# Patient Record
Sex: Male | Born: 1955 | Race: White | Hispanic: No | Marital: Married | Smoking: Former smoker
Health system: Southern US, Community
[De-identification: ages and names within clinical notes are randomized; demographics above are authoritative.]

## PROBLEM LIST (undated history)

## (undated) DIAGNOSIS — R112 Nausea with vomiting, unspecified: Secondary | ICD-10-CM

## (undated) DIAGNOSIS — B1920 Unspecified viral hepatitis C without hepatic coma: Secondary | ICD-10-CM

## (undated) DIAGNOSIS — D2920 Benign neoplasm of unspecified testis: Secondary | ICD-10-CM

## (undated) DIAGNOSIS — I10 Essential (primary) hypertension: Secondary | ICD-10-CM

## (undated) DIAGNOSIS — I4892 Unspecified atrial flutter: Secondary | ICD-10-CM

## (undated) DIAGNOSIS — K633 Ulcer of intestine: Secondary | ICD-10-CM

## (undated) DIAGNOSIS — Q251 Coarctation of aorta: Secondary | ICD-10-CM

## (undated) DIAGNOSIS — Z7901 Long term (current) use of anticoagulants: Secondary | ICD-10-CM

## (undated) DIAGNOSIS — Z8679 Personal history of other diseases of the circulatory system: Secondary | ICD-10-CM

## (undated) DIAGNOSIS — M109 Gout, unspecified: Secondary | ICD-10-CM

## (undated) DIAGNOSIS — I359 Nonrheumatic aortic valve disorder, unspecified: Secondary | ICD-10-CM

## (undated) DIAGNOSIS — D649 Anemia, unspecified: Secondary | ICD-10-CM

## (undated) DIAGNOSIS — N183 Chronic kidney disease, stage 3 (moderate): Secondary | ICD-10-CM

## (undated) DIAGNOSIS — N189 Chronic kidney disease, unspecified: Principal | ICD-10-CM

## (undated) DIAGNOSIS — Z9889 Other specified postprocedural states: Secondary | ICD-10-CM

## (undated) DIAGNOSIS — N289 Disorder of kidney and ureter, unspecified: Secondary | ICD-10-CM

## (undated) DIAGNOSIS — I719 Aortic aneurysm of unspecified site, without rupture: Secondary | ICD-10-CM

## (undated) DIAGNOSIS — R600 Localized edema: Secondary | ICD-10-CM

## (undated) DIAGNOSIS — E785 Hyperlipidemia, unspecified: Secondary | ICD-10-CM

## (undated) DIAGNOSIS — C6290 Malignant neoplasm of unspecified testis, unspecified whether descended or undescended: Secondary | ICD-10-CM

## (undated) DIAGNOSIS — K317 Polyp of stomach and duodenum: Secondary | ICD-10-CM

## (undated) DIAGNOSIS — I2510 Atherosclerotic heart disease of native coronary artery without angina pectoris: Secondary | ICD-10-CM

## (undated) DIAGNOSIS — E669 Obesity, unspecified: Secondary | ICD-10-CM

## (undated) DIAGNOSIS — I712 Thoracic aortic aneurysm, without rupture: Secondary | ICD-10-CM

## (undated) DIAGNOSIS — I4891 Unspecified atrial fibrillation: Secondary | ICD-10-CM

## (undated) DIAGNOSIS — Z952 Presence of prosthetic heart valve: Secondary | ICD-10-CM

## (undated) DIAGNOSIS — D631 Anemia in chronic kidney disease: Secondary | ICD-10-CM

## (undated) HISTORY — DX: Unspecified atrial flutter: I48.92

## (undated) HISTORY — DX: Personal history of other diseases of the circulatory system: Z86.79

## (undated) HISTORY — DX: Chronic kidney disease, stage 3 (moderate): N18.3

## (undated) HISTORY — DX: Malignant neoplasm of unspecified testis, unspecified whether descended or undescended: C62.90

## (undated) HISTORY — DX: Chronic kidney disease, unspecified: N18.9

## (undated) HISTORY — DX: Coarctation of aorta: Q25.1

## (undated) HISTORY — DX: Aortic aneurysm of unspecified site, without rupture: I71.9

## (undated) HISTORY — DX: Anemia in chronic kidney disease: D63.1

## (undated) HISTORY — DX: Unspecified atrial fibrillation: I48.91

## (undated) HISTORY — DX: Presence of prosthetic heart valve: Z95.2

## (undated) HISTORY — PX: TONSILLECTOMY: SUR1361

## (undated) HISTORY — PX: COARCTATION OF AORTA REPAIR: SHX261

## (undated) HISTORY — DX: Long term (current) use of anticoagulants: Z79.01

## (undated) HISTORY — DX: Essential (primary) hypertension: I10

## (undated) HISTORY — DX: Thoracic aortic aneurysm, without rupture: I71.2

## (undated) HISTORY — DX: Atherosclerotic heart disease of native coronary artery without angina pectoris: I25.10

## (undated) HISTORY — DX: Localized edema: R60.0

## (undated) HISTORY — DX: Hyperlipidemia, unspecified: E78.5

## (undated) HISTORY — DX: Gout, unspecified: M10.9

## (undated) HISTORY — DX: Obesity, unspecified: E66.9

## (undated) HISTORY — DX: Anemia, unspecified: D64.9

---
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
## 1990-05-01 DIAGNOSIS — C6290 Malignant neoplasm of unspecified testis, unspecified whether descended or undescended: Secondary | ICD-10-CM

## 1990-05-01 HISTORY — PX: ORCHIECTOMY: SHX2116

## 1990-05-01 HISTORY — DX: Malignant neoplasm of unspecified testis, unspecified whether descended or undescended: C62.90

## 1993-05-01 HISTORY — PX: AORTIC VALVE REPLACEMENT: SHX41

## 2005-05-01 DIAGNOSIS — K633 Ulcer of intestine: Secondary | ICD-10-CM

## 2005-05-01 HISTORY — DX: Ulcer of intestine: K63.3

## 2008-04-16 ENCOUNTER — Encounter: Payer: Self-pay | Admitting: Cardiology

## 2010-06-07 ENCOUNTER — Encounter: Payer: Self-pay | Admitting: Cardiology

## 2010-10-27 ENCOUNTER — Encounter: Payer: Self-pay | Admitting: Cardiology

## 2010-12-15 ENCOUNTER — Encounter: Payer: Self-pay | Admitting: Cardiology

## 2011-03-03 ENCOUNTER — Telehealth: Payer: Self-pay | Admitting: Cardiology

## 2011-03-03 NOTE — Telephone Encounter (Signed)
New message Pt is new pt for dr Stanford Breed and he wants to find an internist please recommend Please call him back

## 2011-03-08 NOTE — Telephone Encounter (Signed)
Pt calling again because he has not received a call yet and he wants to set this up before moving here to [place]

## 2011-03-09 NOTE — Telephone Encounter (Signed)
Spoke with pt, he is going to call the primary care office in [place] to get appt to get established Fredia Beets

## 2011-04-03 ENCOUNTER — Ambulatory Visit: Payer: Self-pay | Admitting: Internal Medicine

## 2011-04-03 ENCOUNTER — Telehealth: Payer: Self-pay | Admitting: *Deleted

## 2011-04-03 ENCOUNTER — Other Ambulatory Visit: Payer: Self-pay | Admitting: Hematology & Oncology

## 2011-04-03 ENCOUNTER — Encounter: Payer: Self-pay | Admitting: Hematology & Oncology

## 2011-04-03 DIAGNOSIS — D509 Iron deficiency anemia, unspecified: Secondary | ICD-10-CM

## 2011-04-03 DIAGNOSIS — C6290 Malignant neoplasm of unspecified testis, unspecified whether descended or undescended: Secondary | ICD-10-CM | POA: Insufficient documentation

## 2011-04-03 DIAGNOSIS — C801 Malignant (primary) neoplasm, unspecified: Secondary | ICD-10-CM | POA: Insufficient documentation

## 2011-04-03 DIAGNOSIS — C61 Malignant neoplasm of prostate: Secondary | ICD-10-CM

## 2011-04-03 NOTE — Telephone Encounter (Signed)
Pt canceled 12-7 has a death in the family, transferred to RN voice mail, I have no openings until 12-27, and he wants to be seen sooner.

## 2011-04-03 NOTE — Telephone Encounter (Signed)
Pt left message on voicemail stating

## 2011-04-03 NOTE — Telephone Encounter (Signed)
Left pt message with 12-4 appointment date and time per RN Vaughan Basta is aware pt needs inj

## 2011-04-04 ENCOUNTER — Ambulatory Visit: Payer: BC Managed Care – PPO

## 2011-04-04 ENCOUNTER — Encounter: Payer: Self-pay | Admitting: *Deleted

## 2011-04-04 ENCOUNTER — Other Ambulatory Visit: Payer: Self-pay | Admitting: Hematology & Oncology

## 2011-04-04 ENCOUNTER — Telehealth: Payer: Self-pay | Admitting: *Deleted

## 2011-04-04 ENCOUNTER — Ambulatory Visit (HOSPITAL_BASED_OUTPATIENT_CLINIC_OR_DEPARTMENT_OTHER): Payer: BC Managed Care – PPO | Admitting: Hematology & Oncology

## 2011-04-04 ENCOUNTER — Encounter: Payer: Self-pay | Admitting: Cardiology

## 2011-04-04 ENCOUNTER — Other Ambulatory Visit (HOSPITAL_BASED_OUTPATIENT_CLINIC_OR_DEPARTMENT_OTHER): Payer: BC Managed Care – PPO | Admitting: Lab

## 2011-04-04 DIAGNOSIS — D509 Iron deficiency anemia, unspecified: Secondary | ICD-10-CM

## 2011-04-04 DIAGNOSIS — C6290 Malignant neoplasm of unspecified testis, unspecified whether descended or undescended: Secondary | ICD-10-CM

## 2011-04-04 DIAGNOSIS — C61 Malignant neoplasm of prostate: Secondary | ICD-10-CM

## 2011-04-04 DIAGNOSIS — N189 Chronic kidney disease, unspecified: Secondary | ICD-10-CM

## 2011-04-04 DIAGNOSIS — D649 Anemia, unspecified: Secondary | ICD-10-CM

## 2011-04-04 DIAGNOSIS — Z952 Presence of prosthetic heart valve: Secondary | ICD-10-CM

## 2011-04-04 LAB — CBC WITH DIFFERENTIAL (CANCER CENTER ONLY)
BASO#: 0 10*3/uL (ref 0.0–0.2)
Eosinophils Absolute: 0.2 10*3/uL (ref 0.0–0.5)
HCT: 30.8 % — ABNORMAL LOW (ref 38.7–49.9)
LYMPH%: 13.9 % — ABNORMAL LOW (ref 14.0–48.0)
MCH: 27.2 pg — ABNORMAL LOW (ref 28.0–33.4)
MCV: 81 fL — ABNORMAL LOW (ref 82–98)
MONO%: 7.4 % (ref 0.0–13.0)
NEUT%: 75.2 % (ref 40.0–80.0)
Platelets: 230 10*3/uL (ref 145–400)
RBC: 3.82 10*6/uL — ABNORMAL LOW (ref 4.20–5.70)

## 2011-04-04 NOTE — Progress Notes (Signed)
This office note has been dictated.

## 2011-04-04 NOTE — Telephone Encounter (Signed)
Pt aware of 12-31 lab said he would be traveling 1-4-

## 2011-04-04 NOTE — Progress Notes (Signed)
DIAGNOSES: 1. Anemia--multifactorial. 2. Renal insufficiency. 3. History of seminoma, status post radiation therapy. 4. Prosthetic tricuspid valve. 5. Aortic coarctation repair.  HISTORY OF PRESENT ILLNESS:  Calvin [name] is a nice 55 year old gentleman. He is originally from [place], [place].  I had a great time talking to him about [place].  He is 1 of the few guys who actually appreciates the [employer], who is out of [place].  He has been back to the [place] area.  He had been seen in the past at [facility].  Anemia has been an issue for him.  He does have some renal insufficiency.  He does have high blood pressure.  He is on 4 or 5 different medications for his blood pressure.  He moved to [place].  He was in [place].  He saw Dr. Tobie Poet.  Dr. Tobie Poet has been following the anemia.  Dr. Tobie Poet has considered a bone marrow test, although one has not been done.  He has had a thorough workup. He has had iron in the past.  He now is back in the [place] area.  He is setting up practice with [place].  He has again had a very extensive workup.  He does have some degree of hemolysis from his mechanical valve, although LDH and haptoglobin have been normal.  He says he had been on AndroGel.  Apparently, this may have caused some kind of a rash.  He stopped taking it.  He is on Coumadin.  He has had upper endoscopy and colonoscopy back in 2008.  There is history of colon cancer in his family.  I think that from the notes from Dr. Tobie Poet, his last IV iron was with Ferrlecit back in 2008.  Calvin [name] says that when his blood gets below 10.5 he does start feeling poor.  He has had no fever. There is no weight loss or weight gain.  He has had no nausea or vomiting.  He has had no melena or bright red blood per rectum.  He has noticed some leg swelling.  He feels that this is somewhat from is traveling.  PAST MEDICAL HISTORY:  Is remarkable for: 1. Multifactorial anemia. 2. Seminoma, status  post radiation. 3. Hypertension. 4. hypotestosteronemia. 5. Mechanical tricuspid valve.  ALLERGIES:  Are none.  MEDICATIONS:  Clonidine 0.1 mg b.i.d., Norvasc 10 mg p.o. daily, Normodyne 300 mg p.o. b.i.d., Metoprolol-XL 100 mg p.o. daily, Benicar/hydrochlorothiazide 40/25 one p.o. daily, Pravastatin 40 mg p.o. daily, Coumadin 7.5 mg daily.  SOCIAL HISTORY:  Remarkable for past tobacco use.  He has a history of alcohol use.  He has no obvious recreational drug use or occupational exposures.  He works [employer], which is fascinating to me.  FAMILY HISTORY:  Relatively noncontributory.  REVIEW OF SYSTEMS:  As stated in the history of present illness.  PHYSICAL EXAM:  General: This is a well-developed, well-nourished white gentleman who is mildly obese.  Vital signs: Show a temperature of 97.1, pulse 56, respiratory rate 18, blood pressure 147/56, and weight is 315. Head and neck exam shows a normocephalic, atraumatic skull.  There are no ocular or oral lesions.  There are no palpable cervical or supraclavicular lymph nodes.  Conjunctiva are pink.  Thyroid is not palpable.  His lungs are clear bilaterally.  No rales, wheezes, or rhonchi.  Cardiac exam: Regular rate and rhythm with a normal S1 and S2. He has a 3/6 systolic ejection murmur.  He does have a click from his tricuspid valve.  His abdominal exam was soft, good bowel sounds.  There is no fluid wave.  There is no guarding or rebound tenderness.  There is no palpable hepatosplenomegaly.  Back exam:  No tenderness over the spine, ribs, or hips.  Extremities shows no shows trace edema in his lower extremities.  He has good range of motion of his joints. Neurological exam: Shows no focal neurological deficits.  Skin exam: He has a faint macular-type rash in his left lower lumbar area.  No vesicles or cirrhosis issue with this.  LABORATORY STUDIES:  White cell count 6.6, hemoglobin 10.4, hematocrit 30.8, and  platelet count 230.  MCV is 81.  Peripheral smear shows mild anisocytosis and poikilocytosis.  He does have significant microcytic red cells.  He has rare schizocyte.  There are no spherocytes.  He has no nucleated red blood cells.  There are no teardrop cells.  He has no Rouleaux formation.  There are no target cells.  White cells appear normal in morphology and maturation.  There are no immature myeloid or lymphoid forms.  There are no hypersegmented polys.  I see no blasts.  Platelets are adequate in number and size.  IMPRESSION:  Calvin [name] is a very nice 55 year old gentleman with anemia. This appears be multifactorial.  I really am concerned about the fact that he had the radiation back 20 years ago.  One can really make a case that he has some marrow dysfunction from the radiation therapy.  The only away to prove this, however,  would be with a bone marrow biopsy.  His MCV is low.  His RDW is high.  This would go along with iron deficiency.  One would think that his iron will be on the low side.  So, we can give him Feraheme.  He does have chronic kidney disease.  This certainly could be a factor. We are checking an erythropoietin level on him.  He may have low testosterone.  We can certainly check his testosterone level.  I think that we will have to await the results of all of our tests to figure out how we can try to the anemia.  Hopefully, it is a matter of just giving him iron.  He is really not taking any Aranesp because of his aortic issues.  He has a descending thoracic aortic aneurysm that is being followed up. He goes [facility] the to [facility] for evaluation for this, I think twice a year.  He gets CT scans a couple of times a year.  We will have to check his testosterone to see how that looks.  We will follow his Coumadin level.  He wants to have [place] check this for him.  He wants to have this managed by [place].  We certainly could do this.  Once we get all the lab  results back on Calvin [name], then we will be able to figure out when to get him back into the office for followup.  I spent about an hour and 20 minutes or so with Calvin [name] today.  He is a real nice guy. And again, it was fun talking to him about [place].    ______________________________ Volanda Napoleon, M.D. PRE/MEDQ  D:  04/04/2011  T:  04/04/2011  Job:  [id number]  ADDENDUM:  Ferritin is 93.  Iron sat is 13%.  Testosterone is 152.  Epo is 30. Will try IV FeraHeme initially.  Also will try Benin

## 2011-04-05 ENCOUNTER — Encounter: Payer: Self-pay | Admitting: Cardiology

## 2011-04-05 ENCOUNTER — Ambulatory Visit (INDEPENDENT_AMBULATORY_CARE_PROVIDER_SITE_OTHER): Payer: BC Managed Care – PPO | Admitting: Cardiology

## 2011-04-05 VITALS — BP 132/74 | HR 74 | Ht 74.0 in | Wt 315.0 lb

## 2011-04-05 DIAGNOSIS — I712 Thoracic aortic aneurysm, without rupture, unspecified: Secondary | ICD-10-CM

## 2011-04-05 DIAGNOSIS — I4891 Unspecified atrial fibrillation: Secondary | ICD-10-CM

## 2011-04-05 DIAGNOSIS — Z952 Presence of prosthetic heart valve: Secondary | ICD-10-CM | POA: Insufficient documentation

## 2011-04-05 DIAGNOSIS — Q251 Coarctation of aorta: Secondary | ICD-10-CM

## 2011-04-05 DIAGNOSIS — I2510 Atherosclerotic heart disease of native coronary artery without angina pectoris: Secondary | ICD-10-CM

## 2011-04-05 DIAGNOSIS — I10 Essential (primary) hypertension: Secondary | ICD-10-CM

## 2011-04-05 DIAGNOSIS — Z8679 Personal history of other diseases of the circulatory system: Secondary | ICD-10-CM

## 2011-04-05 DIAGNOSIS — I4892 Unspecified atrial flutter: Secondary | ICD-10-CM

## 2011-04-05 DIAGNOSIS — I359 Nonrheumatic aortic valve disorder, unspecified: Secondary | ICD-10-CM

## 2011-04-05 HISTORY — DX: Personal history of other diseases of the circulatory system: Z86.79

## 2011-04-05 HISTORY — DX: Thoracic aortic aneurysm, without rupture, unspecified: I71.20

## 2011-04-05 HISTORY — DX: Coarctation of aorta: Q25.1

## 2011-04-05 HISTORY — DX: Thoracic aortic aneurysm, without rupture: I71.2

## 2011-04-05 HISTORY — DX: Essential (primary) hypertension: I10

## 2011-04-05 HISTORY — DX: Presence of prosthetic heart valve: Z95.2

## 2011-04-05 LAB — RETICULOCYTES (CHCC)
ABS Retic: 43.6 10*3/uL (ref 19.0–186.0)
Retic Ct Pct: 1.1 % (ref 0.4–2.3)

## 2011-04-05 LAB — IRON AND TIBC
%SAT: 13 % — ABNORMAL LOW (ref 20–55)
Iron: 43 ug/dL (ref 42–165)
TIBC: 335 ug/dL (ref 215–435)

## 2011-04-05 LAB — ERYTHROPOIETIN: Erythropoietin: 30.8 m[IU]/mL (ref 2.6–34.0)

## 2011-04-05 LAB — TESTOSTERONE: Testosterone: 152.49 ng/dL — ABNORMAL LOW (ref 250–890)

## 2011-04-05 NOTE — Assessment & Plan Note (Signed)
Blood pressure controlled. Continue present medications. He states his present regimen is the only thing that has been found to be helpful in the past.

## 2011-04-05 NOTE — Assessment & Plan Note (Signed)
Previous repair. Followup CT in the future.

## 2011-04-05 NOTE — Assessment & Plan Note (Signed)
Plan continue SBE prophylaxis. Continue Coumadin. Obtain records from previous cardiologist concerning most recent echocardiogram.

## 2011-04-05 NOTE — Assessment & Plan Note (Signed)
Noted based on calcium on previous CT scan. Continue statin. Obtain outside records.

## 2011-04-05 NOTE — Assessment & Plan Note (Signed)
Patient apparently has a 5.6 cm thoracic aortic aneurysm. He has biannual CT scans to follow this. One is performed in [place] in 6 months later [place] in [place]. I will obtain his previous records and we will plan to repeat this when appropriate. He apparently has some degree of renal insufficiency and we will need to check his renal function prior to administering contrast.

## 2011-04-05 NOTE — Patient Instructions (Signed)
Your physician recommends that you schedule a follow-up appointment in: 4 WEEKS

## 2011-04-05 NOTE — Assessment & Plan Note (Signed)
History of atrial fibrillation status post cardioversion. Continue Coumadin and beta blocker.

## 2011-04-05 NOTE — Assessment & Plan Note (Signed)
Patient is in atrial flutter today. His rate is controlled and we will continue his present dose of beta blocker. Continue Coumadin. It is not clear to me that he is particularly symptomatic. He apparently had a bout of atrial fibrillation recently and underwent cardioversion. I will obtain all of his previous records. If he has indeed had atrial fibrillation then we will not pursue ablation of his atrial flutter. If he remains asymptomatic then rate control and anticoagulation would potentially be appropriate as he will require Coumadin for his aortic valve regardless. If he has symptoms we will consider adding an antiarrhythmic followed by repeat cardioversion.

## 2011-04-05 NOTE — Progress Notes (Signed)
HPI: 55 year old male with past medical history paroxysmal atrial fibrillation, history of aortic valve replacement, aortic coarctation, aortic aneurysm, coronary artery disease on CT scan and renal insufficiency for establishment. All records not available. Patient has had previous repair of coarctation as a child. He also had previous aortic valve repair and then aortic valve replacement with a St[employer] Jude valve in 1995. He has a thoracic aneurysm that currently measures 5.6 cm. He is seen at the [facility] clinic on a yearly basis for followup of this. He also apparently has CAT scans of his chest in [place] 6 months after his study in [place]. He apparently first had a bout of atrial fibrillation in July of this year. He had cardioversion in September. He recently moved to this area and presents to establish. He has mild dyspnea on exertion but denies orthopnea or PND. Mild chronic pedal edema. No chest pain, palpitations, syncope or bleeding.  Current Outpatient Prescriptions  Medication Sig Dispense Refill  . acetaminophen (TYLENOL) 500 MG tablet Take 500 mg by mouth every 6 (six) hours as needed.        Marland Kitchen amLODipine (NORVASC) 10 MG tablet Take 10 mg by mouth daily.       Marland Kitchen ascorbic acid (VITAMIN C) 1000 MG tablet Take 1,000 mg by mouth daily.        Marland Kitchen b complex vitamins tablet Take 1 tablet by mouth daily.        . calcium carbonate (OS-CAL) 600 MG TABS Take 600 mg by mouth 2 (two) times daily with a meal.        . cloNIDine (CATAPRES) 0.1 MG tablet Take 0.1 mg by mouth 2 (two) times daily.       . cyclobenzaprine (FLEXERIL) 10 MG tablet Take 5 mg by mouth at bedtime as needed.        Marland Kitchen FOLIC ACID PO Take by mouth 2 (two) times daily.        . Glucosamine HCl-MSM (GLUCOSAMINE-MSM PO) Take by mouth daily.        Marland Kitchen labetalol (NORMODYNE) 200 MG tablet Take 200 mg by mouth 2 (two) times daily.       . metoprolol (TOPROL-XL) 100 MG 24 hr tablet Take 100 mg by mouth daily.       . multivitamin  (THERAGRAN) per tablet Take 1 tablet by mouth daily.        Marland Kitchen olmesartan-hydrochlorothiazide (BENICAR HCT) 40-25 MG per tablet Take 1 tablet by mouth daily.        . pravastatin (PRAVACHOL) 40 MG tablet Take 100 mg by mouth daily.       . sildenafil (VIAGRA) 50 MG tablet Take 50 mg by mouth daily as needed.        . warfarin (COUMADIN) 7.5 MG tablet         Allergies  Allergen Reactions  . Ace Inhibitors     Past Medical History  Diagnosis Date  . 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 Seminoma   . HTN (hypertension)   . CAD (coronary artery disease)   . Aortic aneurysm   . Gout   . Atrial fibrillation   . Anemia   . CKD (chronic kidney disease)   . Hyperlipidemia   . Pneumonia     Past Surgical History  Procedure Date  . Coarctation of aorta repair 1958  . Coarctation of aorta repair 1970  . Aortic valve replacement and mitral valve repair [phone]  Bicuspid aortic valve  . Orchiectomy 1992    testicular cancer  . Aortic valve replacement [address]  . Tonsillectomy     History   Social History  . Marital Status: Married    Spouse Name: N/A    Number of Children: 4  . Years of Education: N/A   Occupational History  . Not on file.   Social History Main Topics  . Smoking status: Former Research scientist (life sciences)  . Smokeless tobacco: Not on file  . Alcohol Use: Yes     wine, occasionally  . Drug Use: No  . Sexually Active: Not on file   Other Topics Concern  . Not on file   Social History Narrative  . No narrative on file    Family History  Problem Relation Age of Onset  . Heart disease      No family history    ROS: no fevers or chills, productive cough, hemoptysis, dysphasia, odynophagia, melena, hematochezia, dysuria, hematuria, rash, seizure activity, orthopnea, PND,  claudication. Remaining systems are negative.  Physical Exam:   Blood pressure 132/74, pulse 74, height 6\' 2"  (1.88 m), weight 315 lb (142.883  kg).  General:  Well developed/well nourished in NAD Skin warm/dry Patient not depressed No peripheral clubbing Back-normal HEENT-normal/normal eyelids Neck supple/normal carotid upstroke bilaterally; no bruits; no JVD; no thyromegaly chest - CTA/ normal expansion CV - irregular/crisp mechanical valve sounds; no rubs or gallops;  PMI nondisplaced, 2/6 systolic murmur. No diastolic murmur. Abdomen -NT/ND, no HSM, no mass, + bowel sounds, no bruit 2+ femoral pulses, no bruits Ext-trace edema, no chords, 2+ DP Neuro-grossly nonfocal ater ECG Atrial flutter, IVCD, inferior MI, lateral TWI

## 2011-04-06 ENCOUNTER — Telehealth: Payer: Self-pay | Admitting: Hematology & Oncology

## 2011-04-06 ENCOUNTER — Other Ambulatory Visit: Payer: Self-pay | Admitting: Hematology & Oncology

## 2011-04-06 DIAGNOSIS — E291 Testicular hypofunction: Secondary | ICD-10-CM

## 2011-04-06 DIAGNOSIS — D509 Iron deficiency anemia, unspecified: Secondary | ICD-10-CM

## 2011-04-06 NOTE — Telephone Encounter (Signed)
i left a message afor him

## 2011-04-07 ENCOUNTER — Other Ambulatory Visit: Payer: Self-pay | Admitting: *Deleted

## 2011-04-07 ENCOUNTER — Ambulatory Visit: Payer: Self-pay | Admitting: Hematology & Oncology

## 2011-04-07 ENCOUNTER — Other Ambulatory Visit: Payer: Self-pay | Admitting: Lab

## 2011-04-07 ENCOUNTER — Telehealth: Payer: Self-pay | Admitting: Cardiology

## 2011-04-07 ENCOUNTER — Ambulatory Visit: Payer: Self-pay

## 2011-04-07 MED ORDER — TESTOSTERONE 10 MG/ACT (2%) TD GEL: 40.0000 mg | Freq: Every day | TRANSDERMAL | Status: DC

## 2011-04-07 NOTE — Telephone Encounter (Addendum)
ROI faxed to [facility]  @ [phone] 04/07/11/KM  ROI faxed to [place] @  [phone] 04/07/11/KM  Records received from [facility] gave to [facility]  04/13/11/km  Records received from [facility] gave to [facility] 04/13/11/km  Additional Records were Mailed to [facility] from [place] to [place] 05/05/11/KM

## 2011-04-07 NOTE — Progress Notes (Signed)
I called in Fortesta gel.

## 2011-04-10 ENCOUNTER — Telehealth: Payer: Self-pay | Admitting: *Deleted

## 2011-04-10 ENCOUNTER — Ambulatory Visit (HOSPITAL_BASED_OUTPATIENT_CLINIC_OR_DEPARTMENT_OTHER): Payer: BC Managed Care – PPO

## 2011-04-10 VITALS — BP 135/58 | HR 53 | Temp 97.5°F

## 2011-04-10 DIAGNOSIS — D509 Iron deficiency anemia, unspecified: Secondary | ICD-10-CM

## 2011-04-10 MED ORDER — FERUMOXYTOL INJECTION 510 MG/17 ML
1020.0000 mg | Freq: Once | INTRAVENOUS | Status: AC
  Administered 2011-04-10: 1020 mg via INTRAVENOUS
  Filled 2011-04-10: qty 34

## 2011-04-10 MED ORDER — SODIUM CHLORIDE 0.9 % IV SOLN
Freq: Once | INTRAVENOUS | Status: AC
  Administered 2011-04-10: 16:00:00 via INTRAVENOUS

## 2011-04-10 MED ORDER — SODIUM CHLORIDE 0.9 % IJ SOLN
3.0000 mL | Freq: Once | INTRAMUSCULAR | Status: DC | PRN
  Filled 2011-04-10: qty 10

## 2011-04-10 NOTE — Telephone Encounter (Signed)
Pt aware of 12-11 iron and mailed schedule to pt

## 2011-04-21 ENCOUNTER — Encounter: Payer: Self-pay | Admitting: Internal Medicine

## 2011-04-21 ENCOUNTER — Ambulatory Visit (INDEPENDENT_AMBULATORY_CARE_PROVIDER_SITE_OTHER): Payer: BC Managed Care – PPO | Admitting: Internal Medicine

## 2011-04-21 DIAGNOSIS — I10 Essential (primary) hypertension: Secondary | ICD-10-CM

## 2011-04-21 DIAGNOSIS — Z79899 Other long term (current) drug therapy: Secondary | ICD-10-CM

## 2011-04-21 DIAGNOSIS — Z23 Encounter for immunization: Secondary | ICD-10-CM

## 2011-04-21 DIAGNOSIS — E291 Testicular hypofunction: Secondary | ICD-10-CM

## 2011-04-21 DIAGNOSIS — E785 Hyperlipidemia, unspecified: Secondary | ICD-10-CM

## 2011-04-21 NOTE — Patient Instructions (Signed)
Please call and schedule a follow up appointment after your [facility] visit and at your convenience.

## 2011-04-23 DIAGNOSIS — E785 Hyperlipidemia, unspecified: Secondary | ICD-10-CM | POA: Insufficient documentation

## 2011-04-23 DIAGNOSIS — E291 Testicular hypofunction: Secondary | ICD-10-CM | POA: Insufficient documentation

## 2011-04-23 NOTE — Assessment & Plan Note (Signed)
Obtain lipid/lft. 

## 2011-04-23 NOTE — Assessment & Plan Note (Signed)
Obtain chem7 due to diuretic use. Plans to schedule f/u appt after his [facility] visit

## 2011-04-23 NOTE — Assessment & Plan Note (Signed)
Obtain psa due to testosterone use

## 2011-04-23 NOTE — Progress Notes (Signed)
  Subjective:    Patient ID: Calvin [name], male    DOB: 1956-04-19, 55 y.o.   MRN: [id number]  HPI Pt presents to clinic to establish care and for follow up of multiple medical problems. H/o seminoma s/p orchiectomy without complication. H/o shingle involving the back in the past and notes intermittent itch sensation in the same distribution without pain or rash. H/o afib/flutter followed by cardiology and INR currently managed by H/O who follows him for anemia. Remote h/o aortic coarct and s/p AVR. Sees the [facility] yearly. Believes colonoscopy due spring 2013 last performed in [place].   Past Medical History  Diagnosis Date  . 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 Seminoma   . HTN (hypertension)   . CAD (coronary artery disease)   . Aortic aneurysm   . Gout   . Atrial fibrillation   . Anemia   . CKD (chronic kidney disease)   . Hyperlipidemia   . Pneumonia    Past Surgical History  Procedure Date  . Coarctation of aorta repair 1958  . Coarctation of aorta repair 1970  . Aortic valve replacement and mitral valve repair 1974    Bicuspid aortic valve  . Orchiectomy 1992    testicular cancer  . Aortic valve replacement [address]  . Tonsillectomy     reports that he has quit smoking. He has never used smokeless tobacco. He reports that he drinks alcohol. He reports that he does not use illicit drugs. family history includes Heart disease in an unspecified family member. Allergies  Allergen Reactions  . Ace Inhibitors      Review of Systems  Cardiovascular: Negative for palpitations.  Skin: Negative for rash.  All other systems reviewed and are negative.       Objective:   Physical Exam  Nursing note and vitals reviewed. Constitutional: He appears well-developed and well-nourished. No distress.  HENT:  Head: Normocephalic and atraumatic.  Right Ear: External ear normal.  Left Ear: External ear normal.  Eyes: Conjunctivae are  normal. Right eye exhibits no discharge. Left eye exhibits no discharge. No scleral icterus.  Neck: Neck supple.  Cardiovascular: Normal rate and regular rhythm.  Exam reveals no gallop and no friction rub.   Murmur heard.  Systolic murmur is present with a grade of 3/6       Valve click  Pulmonary/Chest: Effort normal and breath sounds normal. No respiratory distress. He has no wheezes. He has no rales.  Neurological: He is alert.  Skin: Skin is warm and dry. He is not diaphoretic.  Psychiatric: He has a normal mood and affect.          Assessment & Plan:

## 2011-04-27 ENCOUNTER — Telehealth: Payer: Self-pay | Admitting: Internal Medicine

## 2011-04-27 NOTE — Telephone Encounter (Signed)
Received medical records from [facility]

## 2011-05-01 ENCOUNTER — Other Ambulatory Visit (HOSPITAL_BASED_OUTPATIENT_CLINIC_OR_DEPARTMENT_OTHER): Payer: BC Managed Care – PPO | Admitting: Lab

## 2011-05-01 DIAGNOSIS — E785 Hyperlipidemia, unspecified: Secondary | ICD-10-CM

## 2011-05-01 DIAGNOSIS — D630 Anemia in neoplastic disease: Secondary | ICD-10-CM

## 2011-05-01 DIAGNOSIS — C6290 Malignant neoplasm of unspecified testis, unspecified whether descended or undescended: Secondary | ICD-10-CM

## 2011-05-01 DIAGNOSIS — Z79899 Other long term (current) drug therapy: Secondary | ICD-10-CM

## 2011-05-01 DIAGNOSIS — I4892 Unspecified atrial flutter: Secondary | ICD-10-CM

## 2011-05-01 DIAGNOSIS — D509 Iron deficiency anemia, unspecified: Secondary | ICD-10-CM

## 2011-05-01 LAB — PROTIME-INR (CHCC SATELLITE)
INR: 3.3 (ref 2.0–3.5)
Protime: 39.6 Seconds — ABNORMAL HIGH (ref 10.6–13.4)

## 2011-05-05 ENCOUNTER — Telehealth: Payer: Self-pay | Admitting: Internal Medicine

## 2011-05-05 ENCOUNTER — Other Ambulatory Visit: Payer: BC Managed Care – PPO | Admitting: Lab

## 2011-05-05 NOTE — Telephone Encounter (Signed)
pls inform him his labs were not released/sent to [place] by [facility]. Faxed copy reviewed. Chol under excellent control. Liver, prostate, sodium and potassium normal. Creatinine (kidney test) is elevated at 1.97. i believe he has had h/o elevated values but i do not have old numbers to compare to. See if he has any old lab copies from the [facility] i can review and comare

## 2011-05-05 NOTE — Telephone Encounter (Signed)
i see a [facility] order but no results. ?

## 2011-05-05 NOTE — Telephone Encounter (Signed)
Patient is requesting lab results.

## 2011-05-05 NOTE — Telephone Encounter (Signed)
Call placed to patient at [phone], no answer. A detailed voice message was left informing patient per Dr Elizebeth Koller instructions. Message left for patient to call back if any questions.

## 2011-05-05 NOTE — Telephone Encounter (Signed)
Pt called back and was notified of results. Pt states he forward copy of previous labs to [place] via mail and requests that we mail him a copy of recent results. Copy placed in the mail.

## 2011-05-29 ENCOUNTER — Other Ambulatory Visit (HOSPITAL_BASED_OUTPATIENT_CLINIC_OR_DEPARTMENT_OTHER): Payer: BC Managed Care – PPO | Admitting: Lab

## 2011-05-29 ENCOUNTER — Ambulatory Visit (HOSPITAL_BASED_OUTPATIENT_CLINIC_OR_DEPARTMENT_OTHER): Payer: BC Managed Care – PPO | Admitting: Hematology & Oncology

## 2011-05-29 VITALS — BP 126/72 | HR 86 | Temp 97.0°F | Wt 316.0 lb

## 2011-05-29 DIAGNOSIS — E291 Testicular hypofunction: Secondary | ICD-10-CM

## 2011-05-29 DIAGNOSIS — D631 Anemia in chronic kidney disease: Secondary | ICD-10-CM

## 2011-05-29 DIAGNOSIS — D509 Iron deficiency anemia, unspecified: Secondary | ICD-10-CM

## 2011-05-29 DIAGNOSIS — C61 Malignant neoplasm of prostate: Secondary | ICD-10-CM

## 2011-05-29 DIAGNOSIS — D649 Anemia, unspecified: Secondary | ICD-10-CM

## 2011-05-29 LAB — CBC WITH DIFFERENTIAL (CANCER CENTER ONLY)
BASO#: 0 10*3/uL (ref 0.0–0.2)
BASO%: 0.4 % (ref 0.0–2.0)
Eosinophils Absolute: 0.2 10*3/uL (ref 0.0–0.5)
HCT: 29.3 % — ABNORMAL LOW (ref 38.7–49.9)
HGB: 9.9 g/dL — ABNORMAL LOW (ref 13.0–17.1)
LYMPH#: 1.1 10*3/uL (ref 0.9–3.3)
LYMPH%: 20.8 % (ref 14.0–48.0)
MCV: 83 fL (ref 82–98)
MONO#: 0.4 10*3/uL (ref 0.1–0.9)
NEUT%: 66.7 % (ref 40.0–80.0)
RBC: 3.54 10*6/uL — ABNORMAL LOW (ref 4.20–5.70)
RDW: 14.7 % (ref 11.1–15.7)
WBC: 5.4 10*3/uL (ref 4.0–10.0)

## 2011-05-29 LAB — RETICULOCYTES (CHCC)
ABS Retic: 55.1 10*3/uL (ref 19.0–186.0)
RBC.: 3.67 MIL/uL — ABNORMAL LOW (ref 4.22–5.81)
Retic Ct Pct: 1.5 % (ref 0.4–2.3)

## 2011-05-29 LAB — BASIC METABOLIC PANEL
BUN: 36 mg/dL — ABNORMAL HIGH (ref 6–23)
Chloride: 107 mEq/L (ref 96–112)
Creatinine, Ser: 2.02 mg/dL — ABNORMAL HIGH (ref 0.50–1.35)
Potassium: 4.6 mEq/L (ref 3.5–5.3)

## 2011-05-29 LAB — FERRITIN: Ferritin: 318 ng/mL (ref 22–322)

## 2011-05-29 LAB — TESTOSTERONE: Testosterone: 424.78 ng/dL (ref 250–890)

## 2011-05-29 LAB — IRON AND TIBC: Iron: 43 ug/dL (ref 42–165)

## 2011-05-29 NOTE — Progress Notes (Signed)
This office note has been dictated.

## 2011-05-30 ENCOUNTER — Telehealth: Payer: Self-pay | Admitting: Hematology & Oncology

## 2011-05-30 NOTE — Telephone Encounter (Signed)
Mailed 2-27 schedule to pt

## 2011-05-30 NOTE — Progress Notes (Signed)
DIAGNOSES: 1. Anemia secondary to:     a.     Iron deficiency.     b.     Renal insufficiency.     c.     Hypotestosteronemia. 2. Prosthetic tricuspid valve. 3. Aortic coarctation repair. 4. History of seminoma, status post radiation.  CURRENT THERAPY: 1. The patient is status post 1 dose of IV iron. 2. The patient is on Fortesta gel (not using as directed).  INTERVAL HISTORY:  Calvin [name] comes in for 2nd office visit.  When we 1st saw him back on December 4th, his lab work for the anemia shows iron saturation level of only 13%.  His ferritin was 93, which I believe was low.  His testosterone was 152.  His serum erythropoietin level was only 30.  We did give him a dose of IV iron.  He did get a dose of Feraheme at 1020 mg on December 10th.  I also put him Benin.  He has only had done, I think, 1 pump a day. He was worried about his a kidney function, as he said that in the past when he used a testosterone gel, his kidney function got affected.  I have not heard of testosterone replacement therapy causing kidney abnormalities.  He has had no bleeding.  He is on Coumadin.  He is doing well with this.  He is still traveling for his job.  He is trying to watch what he eats. He is not really able to exercise that much.  PHYSICAL EXAM:  General: This is a well-developed, well-nourished, white gentleman. He is somewhat obese.  Vital signs:  97, pulse 86, respiratory rate 18, blood pressure 126/72, and weight was 316.  Head and neck exam shows a normocephalic, atraumatic skull.  There are no ocular or oral lesions.  There are no palpable cervical or supraclavicular lymph nodes.  Lungs are clear bilaterally.  Cardiac examination:  Regular rate and rhythm with a normal S1 and S2.  He has a systolic click from his prostatic valve.  Abdominal exam: Soft, obese. He has good bowel sounds.  There is no fluid wave.  There is no palpable hepatosplenomegaly.  Extremities: Shows no clubbing,  cyanosis, or edema. He has good range motion of his joints.  Skin exam: No rashes, ecchymoses, or petechia.  Neurological exam: Shows no focal neurological deficits.  Skin exam: No rashes, ecchymoses, or petechia.  LABORATORY STUDIES:  White cell count is 5.4, hemoglobin 10, hematocrit 29.3, and platelet count 242.  MCV is 83.  IMPRESSION:  Calvin [name] is a 56 year old gentleman with multifactorial anemia.  Personally, I really believe that he is going to need Aranesp or Procrit to try to get his hemoglobin up.  We gave him a good dose Feraheme.  I would be surprised if he still iron deficient, but we will check.  He has not had any obvious bleeding from what he is telling me.  I also believe that he really needs to take the [place] as directed.  I suspect that he just is to taking a low dose of this.  We will plan to get him back in another month or so.  We will call with the results from the lab work.    ______________________________ Volanda Napoleon, M.D. PRE/MEDQ  D:  05/29/2011  T:  05/30/2011  Job:  [id number]  ADDENDUM:  Ferritin is 318.  %Sat is 14. T2 is 424.  BUN/Cr is 36/2.02

## 2011-05-31 ENCOUNTER — Ambulatory Visit (INDEPENDENT_AMBULATORY_CARE_PROVIDER_SITE_OTHER): Payer: BC Managed Care – PPO | Admitting: Cardiology

## 2011-05-31 ENCOUNTER — Encounter: Payer: Self-pay | Admitting: Cardiology

## 2011-05-31 ENCOUNTER — Other Ambulatory Visit: Payer: Self-pay | Admitting: *Deleted

## 2011-05-31 VITALS — BP 148/70 | HR 70 | Wt 316.0 lb

## 2011-05-31 DIAGNOSIS — E785 Hyperlipidemia, unspecified: Secondary | ICD-10-CM

## 2011-05-31 DIAGNOSIS — Q251 Coarctation of aorta: Secondary | ICD-10-CM

## 2011-05-31 DIAGNOSIS — I2510 Atherosclerotic heart disease of native coronary artery without angina pectoris: Secondary | ICD-10-CM

## 2011-05-31 DIAGNOSIS — Z952 Presence of prosthetic heart valve: Secondary | ICD-10-CM

## 2011-05-31 DIAGNOSIS — I712 Thoracic aortic aneurysm, without rupture: Secondary | ICD-10-CM

## 2011-05-31 DIAGNOSIS — I4892 Unspecified atrial flutter: Secondary | ICD-10-CM

## 2011-05-31 DIAGNOSIS — I10 Essential (primary) hypertension: Secondary | ICD-10-CM

## 2011-05-31 DIAGNOSIS — Z954 Presence of other heart-valve replacement: Secondary | ICD-10-CM

## 2011-05-31 NOTE — Patient Instructions (Signed)
Your physician wants you to follow-up in: 8 months You will receive a reminder letter in the mail two months in advance. If you don't receive a letter, please call our office to schedule the follow-up appointment.

## 2011-05-31 NOTE — Assessment & Plan Note (Signed)
Continue statin. 

## 2011-05-31 NOTE — Progress Notes (Signed)
HPI: Pleasant male with past medical history of paroxysmal atrial fibrillation/flutter, history of aortic valve replacement, aortic coarctation, aortic aneurysm, coronary artery disease on CT scan and renal insufficiency for fu. Patient is s/p coarctation repair in 1958 and redo in 1971. Had endovascular stent in 1995 and 1996. Had aortic balloon valvuloplasty in 1974 and AVR in 1994. Echo in June of 2012 showed normal LV function, severe LAE, RAE, [employer] AVR with mean gradient of 18 mmHg; trace AI, mild TR, moderate pulmonary hypertension, ascending aorta 5.4 cm. 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 Stent in distal arch, proximal descending aorta patent. Mildly enlarged lymph nodes. Patient noted to be in atrial flutter at that time and had elective DCCV in  in Sept 2012. All records not available but notes from [facility] describe atrial fibrillation. He is seen at the [facility] clinic on a yearly basis for followup of above. I last saw him in Dec 2012. Since then, he has some dyspnea on exertion but no orthopnea or PND. Mild pedal edema that he attributes to recent travel. No chest pain. Minimal palpitations. No syncope or bleeding. 1/28 Hgb 9.9 BUN/CR 36/2.02   Current Outpatient Prescriptions  Medication Sig Dispense Refill  . b complex vitamins tablet Take 1 tablet by mouth daily.        . calcium carbonate (OS-CAL) 600 MG TABS Take 600 mg by mouth 2 (two) times daily with a meal.        . cloNIDine (CATAPRES) 0.1 MG tablet Take 0.1 mg by mouth 2 (two) times daily.       . cyclobenzaprine (FLEXERIL) 10 MG tablet Take 5 mg by mouth at bedtime as needed.        Marland Kitchen FOLIC ACID PO Take by mouth 2 (two) times daily.        . Glucosamine HCl-MSM (GLUCOSAMINE-MSM PO) Take by mouth daily.        Marland Kitchen labetalol (NORMODYNE) 200 MG tablet Take 200 mg by mouth 3 (three) times daily.       . metoprolol (TOPROL-XL) 100 MG 24 hr tablet Take 100 mg by mouth daily.         . multivitamin (THERAGRAN) per tablet Take 1 tablet by mouth daily.        Marland Kitchen olmesartan-hydrochlorothiazide (BENICAR HCT) 40-25 MG per tablet Take 1 tablet by mouth daily.        . pravastatin (PRAVACHOL) 40 MG tablet Take 100 mg by mouth daily.       . sildenafil (VIAGRA) 50 MG tablet Take 50 mg by mouth daily as needed.        . Testosterone (FORTESTA) 10 MG/ACT (2%) GEL Place 40 mg onto the skin daily.  60 g  3  . warfarin (COUMADIN) 7.5 MG tablet       . acetaminophen (TYLENOL) 500 MG tablet Take 500 mg by mouth every 6 (six) hours as needed.        Marland Kitchen amLODipine (NORVASC) 10 MG tablet Take 10 mg by mouth daily.       Marland Kitchen ascorbic acid (VITAMIN C) 1000 MG tablet Take 1,000 mg by mouth daily.           Past Medical History  Diagnosis Date  . 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 Seminoma   . HTN (hypertension)   . CAD (coronary artery disease)   . Aortic aneurysm   . Gout   . Atrial  fibrillation   . Anemia   . CKD (chronic kidney disease)   . Hyperlipidemia   . Pneumonia   . Atrial flutter     Past Surgical History  Procedure Date  . Coarctation of aorta repair 1958  . Coarctation of aorta repair 1970  . Aortic valve replacement and mitral valve repair 1974    Bicuspid aortic valve  . Orchiectomy 1992    testicular cancer  . Aortic valve replacement [address]  . Tonsillectomy     History   Social History  . Marital Status: Married    Spouse Name: N/A    Number of Children: 4  . Years of Education: N/A   Occupational History  . Not on file.   Social History Main Topics  . Smoking status: Former Research scientist (life sciences)  . Smokeless tobacco: Never Used  . Alcohol Use: Yes     wine, occasionally  . Drug Use: No  . Sexually Active: Not on file   Other Topics Concern  . Not on file   Social History Narrative  . No narrative on file    ROS: no fevers or chills, productive cough, hemoptysis, dysphasia, odynophagia, melena,  hematochezia, dysuria, hematuria, rash, seizure activity, orthopnea, PND, pedal edema, claudication. Remaining systems are negative.  Physical Exam: Well-developed well-nourished in no acute distress.  Skin is warm and dry.  HEENT is normal.  Neck is supple. No thyromegaly.  Chest is clear to auscultation with normal expansion.  Cardiovascular exam is irregular with crisp mechanical valve sounds. 2/6 systolic murmur. No diastolic murmur. Abdominal exam nontender or distended. No masses palpated. Extremities show 1+ edema. neuro grossly intact  ECG ATRIAL flutter at a rate of 77. IVCD. Lateral T-wave inversion.

## 2011-05-31 NOTE — Assessment & Plan Note (Signed)
Calcium noted in coronaries on previous CT. Continue statin.

## 2011-05-31 NOTE — Assessment & Plan Note (Signed)
Patient will have a followup echocardiogram in [place] in June. Continue SBE prophylaxis.

## 2011-05-31 NOTE — Assessment & Plan Note (Signed)
Followup CTs in the future.

## 2011-05-31 NOTE — Progress Notes (Signed)
Pt dropped labs by the office to have reviewed by Dr Marin Olp. He wanted to know if he (1) needed to receive a testosterone injection (2) should continue with his testosterone cream (3) and if he still wanted him to have Aranesp? Reviewed with labs with Dr Marin Olp & called Calvin [name] on cell phone to let him know that he (1) does not need a testosterone injection (2) should continue on the testosterone cream & (3) hold off on the Aranesp until after he receives another dose of Feraheme. When he returns to see Dr Marin Olp for f/u he will let him know if he needs to have Aranesp. He agreed to another dose of Feraheme. Will ask scheduling to set him up for Monday 2/4 at 3:30.

## 2011-05-31 NOTE — Assessment & Plan Note (Signed)
The patient remains in atrial flutter today. In reviewing outside records he was in flutter when he was seen in [place] in June of 2012. However notes from [place] state that he was cardioverted from atrial fibrillation. I will obtain the electrocardiograms from [facility]. Regardless we will continue with his present medications for rate control. I am not convinced that he is symptomatic. He has dyspnea on exertion but states he has had this for years. Certainly atrial arrhythmias could be contributing but his symptoms sound more chronic. He will require Coumadin lifelong for his aortic valve. Rate control and anticoagulation may therefore be the best option. We will consider cardioversion in the future if he feels his dyspnea is worsening or his atrial arrhythmias are contributing.

## 2011-05-31 NOTE — Assessment & Plan Note (Signed)
Patient had his last CT in November in [place] by his report. He is due for a followup study in [place] in June. I have asked him to review this issue with his cardiologist in [place]. If his aneurysm is relatively stable it may be worthwhile to decrease frequency of CT scans to an annual basis. He has had significant radiation exposure in the past. He also has renal insufficiency potentially exacerbated by use of contrast.

## 2011-05-31 NOTE — Assessment & Plan Note (Signed)
Blood pressure mildly elevated but he states typically normal. Continue present medications.

## 2011-06-05 ENCOUNTER — Telehealth: Payer: Self-pay | Admitting: Hematology & Oncology

## 2011-06-05 ENCOUNTER — Ambulatory Visit (HOSPITAL_BASED_OUTPATIENT_CLINIC_OR_DEPARTMENT_OTHER): Payer: BC Managed Care – PPO

## 2011-06-05 VITALS — BP 126/73 | HR 89 | Temp 97.1°F

## 2011-06-05 DIAGNOSIS — D509 Iron deficiency anemia, unspecified: Secondary | ICD-10-CM

## 2011-06-05 DIAGNOSIS — IMO0002 Reserved for concepts with insufficient information to code with codable children: Secondary | ICD-10-CM

## 2011-06-05 DIAGNOSIS — D630 Anemia in neoplastic disease: Secondary | ICD-10-CM

## 2011-06-05 MED ORDER — SODIUM CHLORIDE 0.9 % IV SOLN
Freq: Once | INTRAVENOUS | Status: AC
  Administered 2011-06-05: 16:00:00 via INTRAVENOUS

## 2011-06-05 MED ORDER — SODIUM CHLORIDE 0.9 % IV SOLN
1020.0000 mg | Freq: Once | INTRAVENOUS | Status: AC
  Administered 2011-06-05: 1020 mg via INTRAVENOUS
  Filled 2011-06-05: qty 34

## 2011-06-06 ENCOUNTER — Encounter: Payer: Self-pay | Admitting: *Deleted

## 2011-06-06 ENCOUNTER — Other Ambulatory Visit (HOSPITAL_BASED_OUTPATIENT_CLINIC_OR_DEPARTMENT_OTHER): Payer: BC Managed Care – PPO | Admitting: Lab

## 2011-06-06 DIAGNOSIS — I4891 Unspecified atrial fibrillation: Secondary | ICD-10-CM

## 2011-06-06 DIAGNOSIS — IMO0002 Reserved for concepts with insufficient information to code with codable children: Secondary | ICD-10-CM

## 2011-06-06 LAB — PROTIME-INR (CHCC SATELLITE)
INR: 3.1 (ref 2.0–3.5)
Protime: 37.2 Seconds — ABNORMAL HIGH (ref 10.6–13.4)

## 2011-06-06 NOTE — Telephone Encounter (Signed)
No note needed 

## 2011-06-06 NOTE — Progress Notes (Signed)
Left message on pt's home phone that per Dr. Marin Olp, INR was good at 3.1 and no change in Coumadin.

## 2011-06-09 ENCOUNTER — Ambulatory Visit (INDEPENDENT_AMBULATORY_CARE_PROVIDER_SITE_OTHER): Payer: BC Managed Care – PPO | Admitting: Family

## 2011-06-09 ENCOUNTER — Encounter: Payer: Self-pay | Admitting: Family

## 2011-06-09 VITALS — BP 118/70 | HR 83 | Temp 98.3°F | Resp 18 | Wt 312.0 lb

## 2011-06-09 DIAGNOSIS — N189 Chronic kidney disease, unspecified: Secondary | ICD-10-CM

## 2011-06-09 DIAGNOSIS — M545 Low back pain: Secondary | ICD-10-CM

## 2011-06-09 DIAGNOSIS — M549 Dorsalgia, unspecified: Secondary | ICD-10-CM

## 2011-06-09 DIAGNOSIS — N289 Disorder of kidney and ureter, unspecified: Secondary | ICD-10-CM

## 2011-06-09 MED ORDER — HYDROCODONE-ACETAMINOPHEN 5-500 MG PO TABS: 1.0000 | ORAL_TABLET | Freq: Three times a day (TID) | ORAL | Status: DC | PRN

## 2011-06-09 NOTE — Progress Notes (Signed)
Subjective:    Patient ID: Calvin [name], male    DOB: February 17, 1956, 56 y.o.   MRN: [id number]  HPI   Mr.  Calvin [name] is a 56 yr old male who presents today with chief complaint of back pain.  He reports that his back pain is located on the left side.  He reports that he has some associated left sided leg weakness.  He used some vicodin from a dental procedure which helped.  He reports hx of degenerative disc disease with similar flare ups in the past which generally resolve with a few days of vicodin and PT/stretching.   Renal insufficiency- he tells me that Dr. Marin Olp drew his labs and the nurse told him that he has Stage III kidney disease.  He and his wife are very concerned about being told this and worried about progressive kidney failure.  Review of Systems See HPI  Past Medical History  Diagnosis Date  . 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 Seminoma   . HTN (hypertension)   . CAD (coronary artery disease)   . Aortic aneurysm   . Gout   . Atrial fibrillation   . Anemia   . CKD (chronic kidney disease)   . Hyperlipidemia   . Pneumonia   . Atrial flutter     History   Social History  . Marital Status: Married    Spouse Name: N/A    Number of Children: 4  . Years of Education: N/A   Occupational History  . Not on file.   Social History Main Topics  . Smoking status: Former Research scientist (life sciences)  . Smokeless tobacco: Never Used  . Alcohol Use: Yes     wine, occasionally  . Drug Use: No  . Sexually Active: Not on file   Other Topics Concern  . Not on file   Social History Narrative  . No narrative on file    Past Surgical History  Procedure Date  . Coarctation of aorta repair 1958  . Coarctation of aorta repair 1970  . Aortic valve replacement and mitral valve repair 1974    Bicuspid aortic valve  . Orchiectomy 1992    testicular cancer  . Aortic valve replacement [address]  . Tonsillectomy     Family History  Problem Relation  Age of Onset  . Heart disease      No family history    Allergies  Allergen Reactions  . Ace Inhibitors     Current Outpatient Prescriptions on File Prior to Visit  Medication Sig Dispense Refill  . acetaminophen (TYLENOL) 500 MG tablet Take 500 mg by mouth every 6 (six) hours as needed.        Marland Kitchen amLODipine (NORVASC) 10 MG tablet Take 10 mg by mouth daily.       . cloNIDine (CATAPRES) 0.1 MG tablet Take 0.1 mg by mouth 2 (two) times daily.       . cyclobenzaprine (FLEXERIL) 10 MG tablet Take 5 mg by mouth at bedtime as needed.        Marland Kitchen FOLIC ACID PO Take by mouth 2 (two) times daily.        Marland Kitchen labetalol (NORMODYNE) 200 MG tablet Take 200 mg by mouth 3 (three) times daily.       . metoprolol (TOPROL-XL) 100 MG 24 hr tablet Take 100 mg by mouth daily.       Marland Kitchen olmesartan-hydrochlorothiazide (BENICAR HCT) 40-25 MG per tablet Take 1 tablet by  mouth daily.        . pravastatin (PRAVACHOL) 40 MG tablet Take 100 mg by mouth daily.       . sildenafil (VIAGRA) 50 MG tablet Take 50 mg by mouth daily as needed.        . Testosterone (FORTESTA) 10 MG/ACT (2%) GEL Place 40 mg onto the skin daily.  60 g  3  . warfarin (COUMADIN) 7.5 MG tablet       . ascorbic acid (VITAMIN C) 1000 MG tablet Take 1,000 mg by mouth daily.        Marland Kitchen b complex vitamins tablet Take 1 tablet by mouth daily.        . calcium carbonate (OS-CAL) 600 MG TABS Take 600 mg by mouth 2 (two) times daily with a meal.        . Glucosamine HCl-MSM (GLUCOSAMINE-MSM PO) Take by mouth daily.        . multivitamin (THERAGRAN) per tablet Take 1 tablet by mouth daily.          BP 118/70  Pulse 83  Temp(Src) 98.3 F (36.8 C) (Oral)  Resp 18  Wt 312 lb (141.522 kg)  SpO2 98%       Objective:   Physical Exam  Constitutional: He appears well-developed and well-nourished. No distress.  Cardiovascular: Normal rate.  An irregular rhythm present.  Pulmonary/Chest: Effort normal and breath sounds normal. No respiratory distress. He has  no wheezes. He has no rales. He exhibits no tenderness.  Neurological:  Reflex Scores:      Patellar reflexes are 3+ on the right side and 3+ on the left side.      Bilateral LE strength is 5/5. Neg straight leg raise bilaterally.  Psychiatric: He has a normal mood and affect. His behavior is normal. Judgment and thought content normal.          Assessment & Plan:

## 2011-06-09 NOTE — Patient Instructions (Signed)
Please call if symptoms worsen or if no improvement in the next 1-2 weeks. You will be contacted about your referral for physical therapy and nephrology (Kidney doctor). Follow up with Dr. Elizebeth Koller in 1 month.

## 2011-06-09 NOTE — Assessment & Plan Note (Signed)
This does not appear to be new.  Cr was 1.97 in December. 2.2 on 1/28 at Dr. Antonieta Pert office.  Will plan to refer to nephrology for further evaluation as he and his wife are very concerned.  We discussed importance of strict BP control, keeping up with cardiac recommendations, and avoidance of NSAIDS.

## 2011-06-09 NOTE — Assessment & Plan Note (Signed)
New. On coumadin with renal insufficiency, avoid NSAIDS.  Will plan to treat with vicodin PRN for short term. He tells me that he has flexeril PRN at home.  Will also refer for physical therapy.

## 2011-06-13 ENCOUNTER — Telehealth: Payer: Self-pay | Admitting: Family

## 2011-06-13 MED ORDER — HYDROCHLOROTHIAZIDE 25 MG PO TABS: 25.0000 mg | ORAL_TABLET | Freq: Every day | ORAL | Status: DC

## 2011-06-13 NOTE — Telephone Encounter (Signed)
Spoke with pt and let him know that Dr. Clover Mealy- Nephrologist , sent [place] a note recommending that he hold benicar for 1 week then return for a BMET and that results be faxed to [phone] attn: Jeani Hawking.  He should stop benicar HCT for 1 week. Start HCTZ 25mg  daily at that time.  Continue all other blood pressure medications.   He wishes to have the BMET drawn at his follow up apt later this month with Dr. Marin Olp and will notify us when this is completed.  I have asked him to check his BP daily after he transitions off of benicar and call if SBP >160.  He verbalizes understanding.

## 2011-06-15 ENCOUNTER — Telehealth: Payer: Self-pay | Admitting: Internal Medicine

## 2011-06-15 ENCOUNTER — Ambulatory Visit: Payer: BC Managed Care – PPO | Attending: Family | Admitting: Physical Therapy

## 2011-06-15 DIAGNOSIS — IMO0001 Reserved for inherently not codable concepts without codable children: Secondary | ICD-10-CM | POA: Insufficient documentation

## 2011-06-15 DIAGNOSIS — M545 Low back pain, unspecified: Secondary | ICD-10-CM | POA: Insufficient documentation

## 2011-06-15 DIAGNOSIS — M2569 Stiffness of other specified joint, not elsewhere classified: Secondary | ICD-10-CM | POA: Insufficient documentation

## 2011-06-15 NOTE — Telephone Encounter (Signed)
#  30 no rf

## 2011-06-15 NOTE — Telephone Encounter (Signed)
Patient is requesting a refill on Vicodin. Seen as a new patient 04/2011. Seen on 06/09/2011 with Melissa qty of 20 provided to patient at that time. Please advise.

## 2011-06-15 NOTE — Telephone Encounter (Signed)
Patient would like refill to be sent to [facility] [employer]

## 2011-06-16 ENCOUNTER — Telehealth: Payer: Self-pay | Admitting: Internal Medicine

## 2011-06-16 MED ORDER — HYDROCODONE-ACETAMINOPHEN 5-500 MG PO TABS: 1.0000 | ORAL_TABLET | Freq: Three times a day (TID) | ORAL | Status: AC | PRN

## 2011-06-16 NOTE — Telephone Encounter (Signed)
Call placed to [facility] [phone], verbal refill order provided to pharmacist per Dr Elizebeth Koller okay.

## 2011-06-16 NOTE — Telephone Encounter (Signed)
Attempted to phone in Rx to [facility] at [phone], no answer, no voice mail. Will make another attempt.

## 2011-06-16 NOTE — Telephone Encounter (Signed)
No its ok. The creatinine is a more accurate representation of kidney function than bun

## 2011-06-16 NOTE — Telephone Encounter (Signed)
Call returned to patient  [phone], patients wife states patient is to have blood work 7 days after stopping Benicar. Since then, he has found out the he has stage 4 of kidney disease and  has gone on low protein. He would like to know if the low protein diet will affect the lab results.

## 2011-06-16 NOTE — Telephone Encounter (Signed)
Patient has gone a low protein diet. Patient is concerned about BUN results if he makes 2 changes, going off Benicar and changing his diet. Please advise

## 2011-06-19 NOTE — Telephone Encounter (Signed)
Call placed to patient at [phone], he was informed per Dr Elizebeth Koller instructions. He states that he will have the requested test drawn at Dr Lavona Mound office along with his others to be drawn for Dr Martha Clan.

## 2011-06-21 ENCOUNTER — Ambulatory Visit: Payer: BC Managed Care – PPO | Admitting: Rehabilitation

## 2011-06-28 ENCOUNTER — Ambulatory Visit (HOSPITAL_BASED_OUTPATIENT_CLINIC_OR_DEPARTMENT_OTHER): Payer: BC Managed Care – PPO | Admitting: Hematology & Oncology

## 2011-06-28 ENCOUNTER — Telehealth: Payer: Self-pay | Admitting: Internal Medicine

## 2011-06-28 ENCOUNTER — Other Ambulatory Visit (HOSPITAL_BASED_OUTPATIENT_CLINIC_OR_DEPARTMENT_OTHER): Payer: BC Managed Care – PPO | Admitting: Lab

## 2011-06-28 ENCOUNTER — Ambulatory Visit: Payer: BC Managed Care – PPO | Admitting: Rehabilitation

## 2011-06-28 DIAGNOSIS — N289 Disorder of kidney and ureter, unspecified: Secondary | ICD-10-CM

## 2011-06-28 DIAGNOSIS — D649 Anemia, unspecified: Secondary | ICD-10-CM

## 2011-06-28 DIAGNOSIS — E349 Endocrine disorder, unspecified: Secondary | ICD-10-CM

## 2011-06-28 DIAGNOSIS — I4891 Unspecified atrial fibrillation: Secondary | ICD-10-CM

## 2011-06-28 DIAGNOSIS — D631 Anemia in chronic kidney disease: Secondary | ICD-10-CM

## 2011-06-28 DIAGNOSIS — N189 Chronic kidney disease, unspecified: Secondary | ICD-10-CM

## 2011-06-28 DIAGNOSIS — C61 Malignant neoplasm of prostate: Secondary | ICD-10-CM

## 2011-06-28 DIAGNOSIS — D509 Iron deficiency anemia, unspecified: Secondary | ICD-10-CM

## 2011-06-28 LAB — CBC WITH DIFFERENTIAL (CANCER CENTER ONLY)
BASO#: 0 10*3/uL (ref 0.0–0.2)
Eosinophils Absolute: 0.2 10*3/uL (ref 0.0–0.5)
HGB: 10.9 g/dL — ABNORMAL LOW (ref 13.0–17.1)
MCV: 80 fL — ABNORMAL LOW (ref 82–98)
MONO#: 0.5 10*3/uL (ref 0.1–0.9)
NEUT#: 4.2 10*3/uL (ref 1.5–6.5)
Platelets: 232 10*3/uL (ref 145–400)
RBC: 3.87 10*6/uL — ABNORMAL LOW (ref 4.20–5.70)
WBC: 5.9 10*3/uL (ref 4.0–10.0)

## 2011-06-28 LAB — BASIC METABOLIC PANEL
BUN: 27 mg/dL — ABNORMAL HIGH (ref 6–23)
CO2: 22 mEq/L (ref 19–32)
Glucose, Bld: 107 mg/dL — ABNORMAL HIGH (ref 70–99)
Potassium: 4.2 mEq/L (ref 3.5–5.3)
Sodium: 139 mEq/L (ref 135–145)

## 2011-06-28 LAB — IRON AND TIBC: %SAT: 27 % (ref 20–55)

## 2011-06-28 LAB — RETICULOCYTES (CHCC)
ABS Retic: 55.4 10*3/uL (ref 19.0–186.0)
RBC.: 3.96 MIL/uL — ABNORMAL LOW (ref 4.22–5.81)
Retic Ct Pct: 1.4 % (ref 0.4–2.3)

## 2011-06-28 NOTE — Telephone Encounter (Signed)
Talked with Melissa. Looks like nephrology was making recommendations without seeing him and he was never given an appt by nephrology. 1) he has chronic kidney disease. Has had it in old labs dating back to [place] and his kidney test is stable 2) it is not wrong to see a nephrologist about this and we can refer but i would recommend a different nephrology group 3) in the meantime i don't want his bp to suffer which can weaken kidneys in the long term- recommend resume benicar without hctz (at his previous benicar dose-just without the hctz component). Needs to continue to monitor bp as the benicar gets back into his system. 4) need chem7 results for review 5) recommend appt next week to review bp and renal fxn

## 2011-06-28 NOTE — Progress Notes (Signed)
This office note has been dictated.

## 2011-06-28 NOTE — Telephone Encounter (Signed)
Chart reviewed. Note from nephrology stated dc benicar and repeat chem7 to be faxed to their office. First needs to check with nephrology office -we don't have the lab results they do and they didn't supply [place] with any further recommendations about his medication. However if the nephrology office doesn't address his bp i will intervene and suggest medicine changes.

## 2011-06-28 NOTE — Telephone Encounter (Signed)
Call was returned to patient at [phone], he states that he is not following anyone for his kidneys. He stated he was seen for back pain that he was having a couple of weeks ago. His medication was changed and he was not sure what he is suppose to be doing currently. He would like to know if he is supposed to be seeing someone for his kidneys, and if any medication changes should take place.

## 2011-06-28 NOTE — Telephone Encounter (Signed)
Patient returned phone call. He was advised per Dr Elizebeth Koller instructions, and has verbalized understanding. He states that he has been on Benicar Hct for several years and at this point does not feel comfortable stopping it due to his blood pressure elevation. He states that he will continue with it for one more week and will discuss at his office visit with Dr Elizebeth Koller on Thursday afternoon(07-06-2011).

## 2011-06-28 NOTE — Telephone Encounter (Signed)
Call placed to patient at [phone], no answer. A voice message was left for patient to return phone call regarding blood pressure.

## 2011-06-28 NOTE — Telephone Encounter (Signed)
His bp readings for the last week are wed 135/67 (20th) Thurs 143/71 Fri 159/72 sat 153/73 mon 158/75 tues 146/67 wed 141/69.  He was told to stop taking the Benicar HCT and has been taking just the HCT.  A kidney funtion test was taken today and the results will be forwarded to you.  He would like you to call him and let him know how to proceed

## 2011-06-29 NOTE — Progress Notes (Signed)
DIAGNOSES: 1. 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 2. Renal anemia secondary to renal insufficiency. 3. 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 4. Prosthetic tricuspid valve. 5. History of seminoma. 6. Aortic coarctation repair.  CURRENT THERAPY: 1. The patient is status post IV iron. 2. Testosterone-replacement therapy.  INTERIM HISTORY:  Calvin [name] comes in for follow-up.  He is still feeling a little tired.  He is on Coumadin.  His INR today was 2.8.  He has gotten iron.  He has done well with the IV iron.  He wants to hold off on doing any Aranesp or Procrit.  He wants to see how he can do without the possible complications of ESA.  He is traveling.  He hurt his back recently.  He did this, I think, while traveling.  He saw a Restaurant manager, fast food.  He is doing physical therapy right now.  He does need to lose a little weight.  He realizes this and is going to try to make an effort to do this.  He has had no obvious bleeding.  He has had no change in bowel or bladder habits.  PHYSICAL EXAMINATION:  General Appearance:  This is a well-developed, well-nourished white gentleman in no obvious distress.  Vital Signs: Show a temperature of 97, pulse 82, respiratory rate 18, blood pressure 149/69.  Weight is 309.  Head and Neck Exam:  Shows a normocephalic, atraumatic skull.  There are no ocular or oral lesions.  He has no cervical or supraclavicular lymph nodes.  Lungs:  Clear bilaterally. Cardiac Exam:  Regular rate and rhythm with a normal S1 and S2.  There is a systolic click from the prosthetic tricuspid valve.  He has no rubs.  Abdominal Exam:  Soft with good bowel sounds.  There is no fluid wave.  There is no palpable hepatosplenomegaly.  Back Exam:  No tenderness over the spine, ribs or hips.  There may be some slight spasms in the lumbosacral spine region.  Extremities:  Show no clubbing, cyanosis or edema.  Neurological Exam:  Shows no focal  neurological deficits.  LABORATORY STUDIES:  His testosterone is 550.  His ferritin is 820. Iron saturation is 27%.  His CBC shows a white cell count of 5.9, hemoglobin 10.9, hematocrit 31, platelet count 232.  MCV is 80.  His BUN and creatinine are 27 and 1.59.  IMPRESSION:  Calvin [name] is a 56 year old gentleman with multifactorial anemia.  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  He does have some renal insufficiency with his erythropoietin level being on the low side.  It is only 30.  Again, he would benefit from ESA, but he does not want one.  We will plan to get him back in another month or so.  We still need to try to optimize his blood counts so that he will start feeling better.    ______________________________ Volanda Napoleon, M.D. PRE/MEDQ  D:  06/28/2011  T:  06/29/2011  Job:  [id number]  ADDENDUM:  Ferritin is 820.  %Sat is 22.  Testosterone is 549.

## 2011-06-30 ENCOUNTER — Ambulatory Visit: Payer: BC Managed Care – PPO | Attending: Family | Admitting: Rehabilitation

## 2011-06-30 DIAGNOSIS — IMO0001 Reserved for inherently not codable concepts without codable children: Secondary | ICD-10-CM | POA: Insufficient documentation

## 2011-06-30 DIAGNOSIS — M545 Low back pain, unspecified: Secondary | ICD-10-CM | POA: Insufficient documentation

## 2011-06-30 DIAGNOSIS — M2569 Stiffness of other specified joint, not elsewhere classified: Secondary | ICD-10-CM | POA: Insufficient documentation

## 2011-07-04 ENCOUNTER — Telehealth: Payer: Self-pay | Admitting: Cardiology

## 2011-07-04 NOTE — Telephone Encounter (Signed)
New Msg: Pt calling wanting to speak with nurse MD regarding letter pt had sent to [employer] email over the weekend. Pt stated the email contained time sensitive information that pt needs signed by Dr. Stanford Breed. Pt was made aware that Lela was not in the office today and that msg will be sent to Dr. Jacalyn Lefevre nurse, Hilda Blades. Please return pt call to discuss further.

## 2011-07-04 NOTE — Telephone Encounter (Signed)
Spoke with pt, will discuss with lela tomorrow and if she is not here will call pt back to discuss

## 2011-07-06 ENCOUNTER — Ambulatory Visit (INDEPENDENT_AMBULATORY_CARE_PROVIDER_SITE_OTHER): Payer: BC Managed Care – PPO | Admitting: Internal Medicine

## 2011-07-06 ENCOUNTER — Telehealth: Payer: Self-pay | Admitting: *Deleted

## 2011-07-06 ENCOUNTER — Ambulatory Visit: Payer: BC Managed Care – PPO | Admitting: Physical Therapy

## 2011-07-06 ENCOUNTER — Encounter: Payer: Self-pay | Admitting: Internal Medicine

## 2011-07-06 DIAGNOSIS — R21 Rash and other nonspecific skin eruption: Secondary | ICD-10-CM

## 2011-07-06 DIAGNOSIS — N289 Disorder of kidney and ureter, unspecified: Secondary | ICD-10-CM

## 2011-07-06 DIAGNOSIS — M545 Low back pain: Secondary | ICD-10-CM

## 2011-07-06 MED ORDER — OLMESARTAN MEDOXOMIL 40 MG PO TABS: 40.0000 mg | ORAL_TABLET | Freq: Every day | ORAL | Status: DC

## 2011-07-06 MED ORDER — TRIAMCINOLONE ACETONIDE 0.1 % EX CREA: TOPICAL_CREAM | Freq: Two times a day (BID) | CUTANEOUS | Status: DC

## 2011-07-06 NOTE — Assessment & Plan Note (Signed)
Recommend proceeding with nephrology consult. Discussed holding hctz instead of arb but will defer further medication changes to nephrology

## 2011-07-06 NOTE — Telephone Encounter (Signed)
Pt states he just received a call from Dr Jacalyn Lefevre office advising him to see either Jamal Maes or Arley Phenix at [facility] and pt wants to know who you prefer. Reports he is currently taking Benicar HCT and was advised by cardiology to take Benicar only 1 week prior to his next labs with Dr Marin Olp on 07/27/11 and wanted you to be aware.

## 2011-07-06 NOTE — Progress Notes (Signed)
  Subjective:    Patient ID: Calvin [name], male    DOB: June 05, 1955, 56 y.o.   MRN: [id number]  HPI Pt presents to clinic for followup of multiple medical problems. Reviewed renal fxn with recent cr improved from 2.02 to 1.59. Outside records from previous clinic indicate CRI with creatinine 1.5-2.0. Recently held benicar for one week. Has begun to limit protein as well as potassium intake. Avoiding nsaids appropriately. Recent back pain improved with PT/chiro. Took flexeril and vicodin prn. After long care ride has had flare of lbp but without radiation. Notes two month h/o abd and low back rash that is itchy without pain or dermatomal distribution. Attempting otc hydrocortisone. Total time of visit ~26 minutes of which >50% spent in counseling.  Past Medical History  Diagnosis Date  . 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 Seminoma   . HTN (hypertension)   . CAD (coronary artery disease)   . Aortic aneurysm   . Gout   . Atrial fibrillation   . Anemia   . CKD (chronic kidney disease)   . Hyperlipidemia   . Pneumonia   . Atrial flutter    Past Surgical History  Procedure Date  . Coarctation of aorta repair 1958  . Coarctation of aorta repair 1970  . Aortic valve replacement and mitral valve repair 1974    Bicuspid aortic valve  . Orchiectomy 1992    testicular cancer  . Aortic valve replacement [address]  . Tonsillectomy     reports that he has quit smoking. He has never used smokeless tobacco. He reports that he drinks alcohol. He reports that he does not use illicit drugs. family history includes Heart disease in an unspecified family member. Allergies  Allergen Reactions  . Ace Inhibitors       Review of Systems see hpi     Objective:   Physical Exam  Nursing note and vitals reviewed. Constitutional: He appears well-developed and well-nourished. No distress.  Skin: Skin is warm and dry. Rash noted. He is not diaphoretic. No  erythema. No pallor.       Mild left lateral abd excoriations without evidence of secondary bacterial infxn. Mild flakiness/dryness of same skin. No dermatomal distribution          Assessment & Plan:

## 2011-07-06 NOTE — Telephone Encounter (Signed)
Spoke with pt, e mail reviewed by dr Stanford Breed. Pt made aware dr Stanford Breed recommends benicar without the hctz. The pt will have labs from dr Marin Olp forwarded to [place]. He was also given the name of dr Lorrene Reid and dr Justin Mend as kidney doctors we like.

## 2011-07-06 NOTE — Assessment & Plan Note (Signed)
Improved with conservative care. Continue pt/chiro

## 2011-07-06 NOTE — Assessment & Plan Note (Signed)
Appears as dermatitis without unknown trigger. Stop otc cream. Attempt triamcinolone. Consider dermatology if persists

## 2011-07-06 NOTE — Telephone Encounter (Signed)
Trust Dr. Jacalyn Lefevre recommendation as I don't know either personally. Lorrene Reid is [facility] and webb is [facility]. Let me know if he wishes to proceed with referral and i'll place order. Also definitely agree with medication recommendation

## 2011-07-10 ENCOUNTER — Telehealth: Payer: Self-pay | Admitting: Cardiology

## 2011-07-10 NOTE — Telephone Encounter (Signed)
New msg Pt wants to talk to you about letter that you have for him

## 2011-07-10 NOTE — Telephone Encounter (Signed)
Spoke with pt, he wanted to go ahead and get an appt to discuss ablation. Ep appt made.

## 2011-07-11 ENCOUNTER — Ambulatory Visit: Payer: BC Managed Care – PPO | Admitting: Internal Medicine

## 2011-07-14 ENCOUNTER — Ambulatory Visit: Payer: BC Managed Care – PPO | Admitting: Physical Therapy

## 2011-07-17 ENCOUNTER — Ambulatory Visit: Payer: BC Managed Care – PPO | Admitting: Rehabilitation

## 2011-07-18 ENCOUNTER — Telehealth: Payer: Self-pay | Admitting: Hematology & Oncology

## 2011-07-18 NOTE — Telephone Encounter (Signed)
Pt moved 3-28 to 4-4

## 2011-07-27 ENCOUNTER — Ambulatory Visit: Payer: BC Managed Care – PPO | Admitting: Hematology & Oncology

## 2011-07-27 ENCOUNTER — Other Ambulatory Visit: Payer: BC Managed Care – PPO | Admitting: Lab

## 2011-07-28 ENCOUNTER — Ambulatory Visit: Payer: BC Managed Care – PPO | Admitting: Physical Therapy

## 2011-08-01 ENCOUNTER — Encounter: Payer: BC Managed Care – PPO | Admitting: Physical Therapy

## 2011-08-03 ENCOUNTER — Other Ambulatory Visit (HOSPITAL_BASED_OUTPATIENT_CLINIC_OR_DEPARTMENT_OTHER): Payer: BC Managed Care – PPO | Admitting: Lab

## 2011-08-03 ENCOUNTER — Ambulatory Visit (HOSPITAL_BASED_OUTPATIENT_CLINIC_OR_DEPARTMENT_OTHER): Payer: BC Managed Care – PPO | Admitting: Hematology & Oncology

## 2011-08-03 VITALS — BP 125/59 | HR 77 | Temp 97.2°F | Ht 72.0 in | Wt 304.0 lb

## 2011-08-03 DIAGNOSIS — D509 Iron deficiency anemia, unspecified: Secondary | ICD-10-CM

## 2011-08-03 DIAGNOSIS — D649 Anemia, unspecified: Secondary | ICD-10-CM

## 2011-08-03 DIAGNOSIS — Z952 Presence of prosthetic heart valve: Secondary | ICD-10-CM

## 2011-08-03 DIAGNOSIS — I4891 Unspecified atrial fibrillation: Secondary | ICD-10-CM

## 2011-08-03 DIAGNOSIS — D631 Anemia in chronic kidney disease: Secondary | ICD-10-CM

## 2011-08-03 DIAGNOSIS — N289 Disorder of kidney and ureter, unspecified: Secondary | ICD-10-CM

## 2011-08-03 DIAGNOSIS — R5383 Other fatigue: Secondary | ICD-10-CM

## 2011-08-03 DIAGNOSIS — C61 Malignant neoplasm of prostate: Secondary | ICD-10-CM

## 2011-08-03 DIAGNOSIS — E349 Endocrine disorder, unspecified: Secondary | ICD-10-CM

## 2011-08-03 LAB — CBC WITH DIFFERENTIAL (CANCER CENTER ONLY)
BASO%: 0.4 % (ref 0.0–2.0)
Eosinophils Absolute: 0.2 10*3/uL (ref 0.0–0.5)
LYMPH#: 0.7 10*3/uL — ABNORMAL LOW (ref 0.9–3.3)
MCV: 82 fL (ref 82–98)
MONO#: 0.5 10*3/uL (ref 0.1–0.9)
NEUT#: 4.1 10*3/uL (ref 1.5–6.5)
Platelets: 256 10*3/uL (ref 145–400)
RBC: 3.8 10*6/uL — ABNORMAL LOW (ref 4.20–5.70)
WBC: 5.5 10*3/uL (ref 4.0–10.0)

## 2011-08-03 LAB — PROTIME-INR (CHCC SATELLITE)
INR: 4 — ABNORMAL HIGH (ref 2.0–3.5)
Protime: 48 Seconds — ABNORMAL HIGH (ref 10.6–13.4)

## 2011-08-03 NOTE — Progress Notes (Signed)
This office note has been dictated.

## 2011-08-04 NOTE — Progress Notes (Signed)
CC:   [name], MD  DIAGNOSES: 1. Anemia secondary to renal insufficiency. 2. 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 3. 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 4. 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  CURRENT THERAPY: 1. Patient receiving IV iron as indicated. 2. Testosterone replacement therapy. 3. Coumadin to maintain INR between 2.5 and 3.5.  INTERVAL HISTORY:  Mr. Edgar comes in for his followup.  He feels a little bit better.  He still feels a little bit tired.  His back, thankfully, is not hurting as much.  He is taking some chiropractic intervention.  He is still working.  He is trying to slow down a little bit.  He has been getting iron.  He typically has done well with iron.  His last iron dose was back on February 4th when he got 1020 mg.  He is on Fortesta for testosterone replacement.  He is doing okay with this.  When we last did saw him, his ferritin was 8.8 with iron saturation of 27%.  His last testosterone was up to 550.  He still does not want to go with Aranesp or Procrit.  He is seeing a nephrologist at [facility].  He has stage 3 kidney disease.  This certainly does explain his anemia to some degree.  His erythropoietin level back in December was only 31.  Again, he just does not want to go with Aranesp as of yet.  PHYSICAL EXAMINATION:  This is a well-developed well-nourished white gentleman in no obvious distress.  Vital signs:  Temperature of 97.3, pulse of 70, respiratory rate 20, blood pressure 125/60.  Weight is 300 pounds.  Head and neck:  Exam shows a normocephalic, atraumatic skull. There are no ocular or oral lesions.  There are no palpable cervical or supraclavicular lymph nodes.  Lungs:  Clear bilaterally.  Cardiac: Regular rate and rhythm with occasional extra beat.  He does have a systolic click from his prostatic valve.  He has no murmurs.  Abdomen: Soft, mildly obese.  He has good bowel  sounds.  There is no fluid wave. There is no palpable hepatosplenomegaly.  Extremities:  No clubbing, cyanosis or edema.  He has good range of motion of his joints.  Skin: No rashes, ecchymosis or petechia.  Neurologic:  No focal neurological deficits.  LABORATORY STUDIES:  White cell count 5.5, hemoglobin 10.7, hematocrit 31.1, platelet count 256.  INR is 4.0.  IMPRESSION:  Mr. Khera is a 56 year old gentleman with multifactorial anemia.  Again, the most important cause is likely renal insufficiency. He just does not want to commit to an ESA program as of yet.  He just wants to try to work through the fatigue.  Hopefully, he feels that if he loses weight and exercises more, he will feel better.  We will still need to check his blood every 3 months or so.  I will plan to see him back myself in probably 3 months.  ADDENDUM:  Mr. Dilone is going to be undergoing, I think, a cardiac ablation.  He feels this may help him feel better if his heart can get into rhythm and stay there.  I certainly do not have a problem with this.    ______________________________ Volanda Napoleon, M.D. PRE/MEDQ  D:  08/03/2011  T:  08/04/2011  Job:  [id number]

## 2011-08-07 ENCOUNTER — Telehealth: Payer: Self-pay | Admitting: *Deleted

## 2011-08-07 NOTE — Telephone Encounter (Signed)
Pt left message on voicemail wanting to know the results of his BUN, Creat, Phos, Vit D, & PTH that were requested by his nephrologist. Returned his call at 1030 and left a voicemail stating that the results went directly to Dr. Olivia Mackie but to please call back if his INR was not addressed or if he has any additional questions. Calvin [name] did call back at 11:00 wanting to know what his BUN, Creat, testosterone, and iron levels were. Explained to him that his kidney function studies were sent directly to Dr Olivia Mackie. He wanted to know why that was. Told him that because he had requested it and he was now being followed by him for his kidney issues it was most appropriate for him to manage the results. Also told him that his testosterone and iron studies will be checked with his next visit. He verbalized understanding.

## 2011-08-14 ENCOUNTER — Encounter: Payer: Self-pay | Admitting: Internal Medicine

## 2011-08-14 ENCOUNTER — Ambulatory Visit (INDEPENDENT_AMBULATORY_CARE_PROVIDER_SITE_OTHER): Payer: BC Managed Care – PPO | Admitting: Internal Medicine

## 2011-08-14 VITALS — BP 132/78 | HR 82 | Resp 18 | Ht 74.0 in | Wt 301.1 lb

## 2011-08-14 DIAGNOSIS — I4892 Unspecified atrial flutter: Secondary | ICD-10-CM

## 2011-08-14 DIAGNOSIS — Z954 Presence of other heart-valve replacement: Secondary | ICD-10-CM

## 2011-08-14 DIAGNOSIS — I10 Essential (primary) hypertension: Secondary | ICD-10-CM

## 2011-08-14 DIAGNOSIS — Z952 Presence of prosthetic heart valve: Secondary | ICD-10-CM

## 2011-08-14 DIAGNOSIS — I4891 Unspecified atrial fibrillation: Secondary | ICD-10-CM

## 2011-08-14 NOTE — Assessment & Plan Note (Signed)
Stable No change required today  

## 2011-08-14 NOTE — Patient Instructions (Signed)
Your physician recommends that you schedule a follow-up appointment in: 4 weeks with Dr Rayann Heman  Your physician has requested that you have an echocardiogram. Echocardiography is a painless test that uses sound waves to create images of your heart. It provides your doctor with information about the size and shape of your heart and how well your heart's chambers and valves are working. This procedure takes approximately one hour. There are no restrictions for this procedure.

## 2011-08-14 NOTE — Assessment & Plan Note (Signed)
As above.

## 2011-08-14 NOTE — Progress Notes (Signed)
Primary Care Physician: Jeb Levering, Calvin Riser, MD, MD Referring Physician:  Dr Trilby Leaver Calvin [name] is a 56 y.o. male with a h/o valvular heart disease s/p aortic valve replacement also with multiple prior aorta surgeries for coarctation who presents today for EP consultation regarding atrial flutter.  He was initially diagnosed with atrial flutter last summer after presenting to [facility] for routine valvular follow-up.  Rate controlled was planned at that point.  He then underwent cardioversion in [facility] 10/12.  Though tracings are not available, per Dr Stanford Breed, reports were that he had afib at that time.  By the time he returned to establish cardiology care with Dr Stanford Breed in January 2013, he had returned to atrial flutter.  He remains in atrial flutter perisistently.  He is not certain as to whether or not he is symptomatic.  He reports some improvements following prior cardioversion, but was not clear when he went back into atrial flutter.  He reports some fatigue but is not sure if this is due to atrial flutter or anemia.  His exercise tolerance is decreased.  Today, he denies symptoms of palpitations, chest pain, shortness of breath, orthopnea, PND, lower extremity edema, dizziness, presyncope, syncope, or neurologic sequela. The patient is tolerating medications without difficulties and is otherwise without complaint today.   Past Medical History  Diagnosis Date  . Anemia   . 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 HTN (hypertension)   . CAD (coronary artery disease)   . Aortic aneurysm   . Gout   . Atrial fibrillation   . Anemia   . CKD (chronic kidney disease)     baseline creatinine is 1.5-2  . Hyperlipidemia   . Atrial flutter   . Obesity    Past Surgical History  Procedure Date  . Coarctation of aorta repair 1958  . Coarctation of aorta repair 1970  . Aortic valve replacement and mitral valve repair 1974    Bicuspid aortic valve  . Orchiectomy  1992    testicular cancer  . Aortic valve replacement 1995    [facility]  . Tonsillectomy     Current Outpatient Prescriptions  Medication Sig Dispense Refill  . amLODipine (NORVASC) 10 MG tablet Take 10 mg by mouth daily.       Marland Kitchen ascorbic acid (VITAMIN C) 1000 MG tablet Take 1,000 mg by mouth daily.        Marland Kitchen b complex vitamins tablet Take 1 tablet by mouth daily.        . cloNIDine (CATAPRES) 0.1 MG tablet Take 0.1 mg by mouth 2 (two) times daily.       . cyclobenzaprine (FLEXERIL) 10 MG tablet Take 5 mg by mouth at bedtime as needed.        Marland Kitchen FOLIC ACID PO Take by mouth 2 (two) times daily.        . furosemide (LASIX) 40 MG tablet 40 mg.       . labetalol (NORMODYNE) 200 MG tablet Take 200 mg by mouth 3 (three) times daily.       . metoprolol (TOPROL-XL) 100 MG 24 hr tablet Take 100 mg by mouth daily.       Marland Kitchen olmesartan (BENICAR) 40 MG tablet Take 1 tablet (40 mg total) by mouth daily.  30 tablet  11  . pravastatin (PRAVACHOL) 40 MG tablet Take 40 mg by mouth daily.       . sildenafil (VIAGRA) 50 MG tablet Take  50 mg by mouth daily as needed.        . Testosterone (FORTESTA) 10 MG/ACT (2%) GEL Place 40 mg onto the skin daily.  60 g  3  . warfarin (COUMADIN) 7.5 MG tablet       . acetaminophen (TYLENOL) 500 MG tablet Take 500 mg by mouth every 6 (six) hours as needed.        . calcium carbonate (OS-CAL) 600 MG TABS Take 600 mg by mouth 2 (two) times daily with a meal.        . Glucosamine HCl-MSM (GLUCOSAMINE-MSM PO) Take by mouth daily.        Marland Kitchen HYDROcodone-acetaminophen (VICODIN) 5-500 MG per tablet Take 5-500 mg by mouth Every 4 hours as needed.      . multivitamin (THERAGRAN) per tablet Take 1 tablet by mouth daily.        Marland Kitchen triamcinolone cream (KENALOG) 0.1 % Apply topically 2 (two) times daily.  60 g  0    Allergies  Allergen Reactions  . Ace Inhibitors     History   Social History  . Marital Status: Married    Spouse Name: N/A    Number of Children: 4  . Years  of Education: N/A   Occupational History  . Not on file.   Social History Main Topics  . Smoking status: Former Research scientist (life sciences)  . Smokeless tobacco: Never Used  . Alcohol Use: Yes     wine, occasionally  . Drug Use: No  . Sexually Active: Not on file   Other Topics Concern  . Not on file   Social History Narrative   Pt lives in [place] with wife.  3 children ages 22,20,22 (all 3 are adopted)Sales grinding wheels.    Family History  Problem Relation Age of Onset  . Heart disease      No family history    ROS- All systems are reviewed and negative except as per the HPI above  Physical Exam: Filed Vitals:   08/14/11 1143  BP: 132/78  Pulse: 82  Resp: 18  Height: 6\' 2"  (1.88 m)  Weight: 301 lb 1.9 oz (136.587 kg)    GEN- The patient is well appearing, alert and oriented x 3 today.   Head- normocephalic, atraumatic Eyes-  Sclera clear, conjunctiva pink Ears- hearing intact Oropharynx- clear Neck- supple, no JVP Lymph- no cervical lymphadenopathy Lungs- Clear to ausculation bilaterally, normal work of breathing Heart- irrgular rate and rhythm, mechanical S2 GI- soft, NT, ND, + BS Extremities- no clubbing, cyanosis, or edema MS- no significant deformity or atrophy Skin- no rash or lesion Psych- euthymic mood, full affect Neuro- strength and sensation are intact  EKG todayl reveals typical appearing atrial flutter with atrial flutter cycle length prolonged at 386msec.  V rates are 57 bpm.  IVCD, nonspecific ST/T changes  Assessment and Plan:

## 2011-08-14 NOTE — Assessment & Plan Note (Signed)
Lifelong anticoagulation is required

## 2011-08-14 NOTE — Assessment & Plan Note (Signed)
The patient presents today for EP consultation regarding atrial flutter.  Ekg reveals typical appearing atrial flutter.  He carries a possible diagnosis of atrial fibrillation also, though this is not well documented.  He does by report have severe biatrial enlargement. He may be symptomatic with atrial flutter due to fatigue, though he feels that his fatigue may also be due to anemia.  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  Therapeutic strategies for atrial flutter including rate control vs ablation were discussed in detail with the patient today. Risk, benefits, and alternatives to EP study and radiofrequency ablation were also discussed in detail today.  He is not certain that he would like to proceed with ablation.  Given his need for lifelong anticoagulation and paucity of symptoms, I think that rate control would be a reasonable option. At this point, I think the most prudent option would be to repeat an echo to evaluate his LA demension.  If he indeed has severe atrial enlargement, then I suspect that our ability to avoid atrial fibrillation long term is decreased and rate control may be appropriate.  If his atria are only moderately enlarged, then perhaps we should proceed with ablation.  He will return after his echo in 4 weeks.

## 2011-08-15 ENCOUNTER — Telehealth: Payer: Self-pay | Admitting: Internal Medicine

## 2011-08-15 NOTE — Telephone Encounter (Signed)
Mr Shamrock travels and he has a hard time scheduling with the pt in [facility].  They close on Fridays at noon.  Could we fax the order for physical therapy to [facility] fax # [phone] to [facility]

## 2011-08-16 NOTE — Telephone Encounter (Signed)
ok 

## 2011-08-18 ENCOUNTER — Telehealth: Payer: Self-pay | Admitting: Cardiology

## 2011-08-18 MED ORDER — OLMESARTAN MEDOXOMIL 40 MG PO TABS: 40.0000 mg | ORAL_TABLET | Freq: Every day | ORAL | Status: DC

## 2011-08-18 NOTE — Telephone Encounter (Signed)
New Problem:     Patient called in needing a 90 day refill of olmesartan (BENICAR) 40 MG tablet from now on.

## 2011-08-25 ENCOUNTER — Other Ambulatory Visit: Payer: Self-pay

## 2011-08-25 ENCOUNTER — Ambulatory Visit (HOSPITAL_COMMUNITY): Payer: BC Managed Care – PPO | Attending: Internal Medicine

## 2011-08-25 DIAGNOSIS — I359 Nonrheumatic aortic valve disorder, unspecified: Secondary | ICD-10-CM | POA: Insufficient documentation

## 2011-08-25 DIAGNOSIS — I517 Cardiomegaly: Secondary | ICD-10-CM | POA: Insufficient documentation

## 2011-08-25 DIAGNOSIS — I059 Rheumatic mitral valve disease, unspecified: Secondary | ICD-10-CM | POA: Insufficient documentation

## 2011-08-25 DIAGNOSIS — I4891 Unspecified atrial fibrillation: Secondary | ICD-10-CM

## 2011-08-25 DIAGNOSIS — I10 Essential (primary) hypertension: Secondary | ICD-10-CM | POA: Insufficient documentation

## 2011-08-25 DIAGNOSIS — I4892 Unspecified atrial flutter: Secondary | ICD-10-CM | POA: Insufficient documentation

## 2011-08-25 DIAGNOSIS — I2510 Atherosclerotic heart disease of native coronary artery without angina pectoris: Secondary | ICD-10-CM | POA: Insufficient documentation

## 2011-09-06 ENCOUNTER — Other Ambulatory Visit (HOSPITAL_BASED_OUTPATIENT_CLINIC_OR_DEPARTMENT_OTHER): Payer: BC Managed Care – PPO | Admitting: Lab

## 2011-09-06 DIAGNOSIS — I4891 Unspecified atrial fibrillation: Secondary | ICD-10-CM

## 2011-09-06 DIAGNOSIS — D649 Anemia, unspecified: Secondary | ICD-10-CM

## 2011-09-06 DIAGNOSIS — D509 Iron deficiency anemia, unspecified: Secondary | ICD-10-CM

## 2011-09-06 DIAGNOSIS — C61 Malignant neoplasm of prostate: Secondary | ICD-10-CM

## 2011-09-06 DIAGNOSIS — Z952 Presence of prosthetic heart valve: Secondary | ICD-10-CM

## 2011-09-06 LAB — CBC WITH DIFFERENTIAL (CANCER CENTER ONLY)
BASO#: 0 10*3/uL (ref 0.0–0.2)
EOS%: 3.4 % (ref 0.0–7.0)
HCT: 31.2 % — ABNORMAL LOW (ref 38.7–49.9)
HGB: 10.8 g/dL — ABNORMAL LOW (ref 13.0–17.1)
MCH: 28.6 pg (ref 28.0–33.4)
MCHC: 34.6 g/dL (ref 32.0–35.9)
MONO%: 7.8 % (ref 0.0–13.0)
NEUT%: 67.6 % (ref 40.0–80.0)

## 2011-09-06 LAB — TESTOSTERONE: Testosterone: 360.87 ng/dL (ref 300–890)

## 2011-09-06 LAB — PROTIME-INR (CHCC SATELLITE)
INR: 4.5 — ABNORMAL HIGH (ref 2.0–3.5)
Protime: 54 Seconds — ABNORMAL HIGH (ref 10.6–13.4)

## 2011-09-06 LAB — IRON AND TIBC
%SAT: 18 % — ABNORMAL LOW (ref 20–55)
UIBC: 254 ug/dL (ref 125–400)

## 2011-09-13 ENCOUNTER — Ambulatory Visit (INDEPENDENT_AMBULATORY_CARE_PROVIDER_SITE_OTHER): Payer: BC Managed Care – PPO | Admitting: Internal Medicine

## 2011-09-13 ENCOUNTER — Other Ambulatory Visit: Payer: Self-pay | Admitting: *Deleted

## 2011-09-13 ENCOUNTER — Encounter: Payer: Self-pay | Admitting: Internal Medicine

## 2011-09-13 VITALS — BP 116/62 | HR 67 | Ht 74.0 in | Wt 298.8 lb

## 2011-09-13 DIAGNOSIS — I4891 Unspecified atrial fibrillation: Secondary | ICD-10-CM

## 2011-09-13 DIAGNOSIS — I4892 Unspecified atrial flutter: Secondary | ICD-10-CM

## 2011-09-13 NOTE — Patient Instructions (Addendum)
Your physician recommends that you schedule a follow-up appointment as needed  

## 2011-09-15 ENCOUNTER — Other Ambulatory Visit (HOSPITAL_BASED_OUTPATIENT_CLINIC_OR_DEPARTMENT_OTHER): Payer: BC Managed Care – PPO | Admitting: Lab

## 2011-09-15 DIAGNOSIS — I4891 Unspecified atrial fibrillation: Secondary | ICD-10-CM

## 2011-09-15 DIAGNOSIS — D630 Anemia in neoplastic disease: Secondary | ICD-10-CM

## 2011-09-15 DIAGNOSIS — C61 Malignant neoplasm of prostate: Secondary | ICD-10-CM

## 2011-09-15 DIAGNOSIS — D509 Iron deficiency anemia, unspecified: Secondary | ICD-10-CM

## 2011-09-15 LAB — CBC WITH DIFFERENTIAL (CANCER CENTER ONLY)
BASO#: 0 10*3/uL (ref 0.0–0.2)
BASO%: 0.5 % (ref 0.0–2.0)
EOS%: 2.8 % (ref 0.0–7.0)
HCT: 30.3 % — ABNORMAL LOW (ref 38.7–49.9)
LYMPH#: 1.3 10*3/uL (ref 0.9–3.3)
MCH: 28.6 pg (ref 28.0–33.4)
MCHC: 34.3 g/dL (ref 32.0–35.9)
MONO%: 6.1 % (ref 0.0–13.0)
NEUT#: 4.5 10*3/uL (ref 1.5–6.5)
NEUT%: 70.1 % (ref 40.0–80.0)
RDW: 14.5 % (ref 11.1–15.7)

## 2011-09-15 LAB — PROTIME-INR (CHCC SATELLITE)

## 2011-09-17 ENCOUNTER — Encounter: Payer: Self-pay | Admitting: Internal Medicine

## 2011-09-17 NOTE — Progress Notes (Signed)
PCP: Jeb Levering, Philbert Riser, MD, MD Primary Cardiologist:  Dr Trilby Leaver Calvin [name] is a 56 y.o. male who presents today for routine electrophysiology followup.  Since last being seen in our clinic, the patient reports doing well.  His echo confirms severe atrial enlargement.  Today, he denies symptoms of palpitations, chest pain, shortness of breath,  lower extremity edema, dizziness, presyncope, or syncope.  His fatigue is stable.  The patient is otherwise without complaint today.   Past Medical History  Diagnosis Date  . Anemia   . 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 HTN (hypertension)   . CAD (coronary artery disease)   . Aortic aneurysm   . Gout   . Atrial fibrillation   . Anemia   . CKD (chronic kidney disease)     baseline creatinine is 1.5-2  . Hyperlipidemia   . Atrial flutter   . Obesity    Past Surgical History  Procedure Date  . Coarctation of aorta repair 1958  . Coarctation of aorta repair 1970  . Aortic valve replacement and mitral valve repair 1974    Bicuspid aortic valve  . Orchiectomy 1992    testicular cancer  . Aortic valve replacement 1995    [facility]  . Tonsillectomy     Current Outpatient Prescriptions  Medication Sig Dispense Refill  . acetaminophen (TYLENOL) 500 MG tablet Take 500 mg by mouth every 6 (six) hours as needed.        Marland Kitchen amLODipine (NORVASC) 10 MG tablet Take 10 mg by mouth daily.       Marland Kitchen amoxicillin (AMOXIL) 500 MG capsule Before dental appts.      Marland Kitchen ascorbic acid (VITAMIN C) 1000 MG tablet Take 1,000 mg by mouth as needed.       Marland Kitchen b complex vitamins tablet Take 1 tablet by mouth daily.        . cloNIDine (CATAPRES) 0.1 MG tablet Take 0.1 mg by mouth 2 (two) times daily.       . cyclobenzaprine (FLEXERIL) 10 MG tablet Take 5 mg by mouth at bedtime as needed.        Marland Kitchen FOLIC ACID PO Take by mouth 2 (two) times daily.        . furosemide (LASIX) 40 MG tablet 40 mg.       . HYDROcodone-acetaminophen (VICODIN) 5-500  MG per tablet Take 5-500 mg by mouth Every 4 hours as needed.      . labetalol (NORMODYNE) 200 MG tablet Take 200 mg by mouth 3 (three) times daily.       . metoprolol (TOPROL-XL) 100 MG 24 hr tablet Take 100 mg by mouth daily.       Marland Kitchen olmesartan (BENICAR) 40 MG tablet Take 1 tablet (40 mg total) by mouth daily.  90 tablet  3  . sildenafil (VIAGRA) 50 MG tablet Take 50 mg by mouth daily as needed.        . Testosterone (FORTESTA) 10 MG/ACT (2%) GEL Place 40 mg onto the skin daily.  60 g  3  . triamcinolone cream (KENALOG) 0.1 % Apply topically as needed.      . warfarin (COUMADIN) 7.5 MG tablet         Physical Exam: Filed Vitals:   09/13/11 1406  BP: 116/62  Pulse: 67  Height: 6\' 2"  (1.88 m)  Weight: 298 lb 12.8 oz (135.535 kg)    GEN- The patient is well appearing, alert and  oriented x 3 today.   Head- normocephalic, atraumatic Eyes-  Sclera clear, conjunctiva pink Ears- hearing intact Oropharynx- clear Lungs- Clear to ausculation bilaterally, normal work of breathing Heart- irregular rate and rhythm, no murmurs, rubs or gallops, PMI not laterally displaced GI- soft, NT, ND, + BS Extremities- no clubbing, cyanosis, or edema  ekg reveals typical appearing atrial flutter, V rate 67 bpm  Assessment and Plan:

## 2011-09-17 NOTE — Assessment & Plan Note (Signed)
He may be symptomatic with atrial flutter due to fatigue, though he feels that his fatigue may also be due to anemia.  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   I have reviewed his recent echo. Given his severe atrial enlargement, I suspect that our ability to avoid afib long term may be limited. At this time, he is clear that he does not wish to pursue atrial flutter ablation.  He wishes to continue rate control long term.  As he is minimally symptomatic, I think that this is reasonable.  He has follow-up scheduled soon at [facility].  He will discuss this further there. If he decides to proceed with ablation in the future, I am happy to discuss further with him. Otherwise, I will see him as needed.

## 2011-09-18 ENCOUNTER — Other Ambulatory Visit: Payer: Self-pay | Admitting: Hematology & Oncology

## 2011-09-18 DIAGNOSIS — D509 Iron deficiency anemia, unspecified: Secondary | ICD-10-CM

## 2011-09-18 DIAGNOSIS — C61 Malignant neoplasm of prostate: Secondary | ICD-10-CM

## 2011-09-18 DIAGNOSIS — Z952 Presence of prosthetic heart valve: Secondary | ICD-10-CM

## 2011-10-04 ENCOUNTER — Encounter: Payer: Self-pay | Admitting: Hematology & Oncology

## 2011-10-04 ENCOUNTER — Other Ambulatory Visit: Payer: Self-pay | Admitting: Hematology & Oncology

## 2011-10-04 ENCOUNTER — Telehealth: Payer: Self-pay | Admitting: Internal Medicine

## 2011-10-04 ENCOUNTER — Other Ambulatory Visit (HOSPITAL_BASED_OUTPATIENT_CLINIC_OR_DEPARTMENT_OTHER): Payer: BC Managed Care – PPO | Admitting: Lab

## 2011-10-04 ENCOUNTER — Ambulatory Visit (HOSPITAL_BASED_OUTPATIENT_CLINIC_OR_DEPARTMENT_OTHER): Payer: BC Managed Care – PPO

## 2011-10-04 DIAGNOSIS — N289 Disorder of kidney and ureter, unspecified: Secondary | ICD-10-CM

## 2011-10-04 DIAGNOSIS — D649 Anemia, unspecified: Secondary | ICD-10-CM

## 2011-10-04 DIAGNOSIS — D630 Anemia in neoplastic disease: Secondary | ICD-10-CM

## 2011-10-04 DIAGNOSIS — D509 Iron deficiency anemia, unspecified: Secondary | ICD-10-CM

## 2011-10-04 DIAGNOSIS — I4891 Unspecified atrial fibrillation: Secondary | ICD-10-CM

## 2011-10-04 DIAGNOSIS — Z952 Presence of prosthetic heart valve: Secondary | ICD-10-CM

## 2011-10-04 DIAGNOSIS — D631 Anemia in chronic kidney disease: Secondary | ICD-10-CM

## 2011-10-04 DIAGNOSIS — N189 Chronic kidney disease, unspecified: Secondary | ICD-10-CM

## 2011-10-04 DIAGNOSIS — C61 Malignant neoplasm of prostate: Secondary | ICD-10-CM

## 2011-10-04 HISTORY — DX: Anemia in chronic kidney disease: N18.9

## 2011-10-04 HISTORY — DX: Anemia in chronic kidney disease: D63.1

## 2011-10-04 LAB — CBC WITH DIFFERENTIAL (CANCER CENTER ONLY)
BASO#: 0 10*3/uL (ref 0.0–0.2)
BASO%: 0.3 % (ref 0.0–2.0)
EOS%: 2.4 % (ref 0.0–7.0)
HGB: 10 g/dL — ABNORMAL LOW (ref 13.0–17.1)
LYMPH#: 1.2 10*3/uL (ref 0.9–3.3)
MCH: 28.7 pg (ref 28.0–33.4)
MCHC: 34.4 g/dL (ref 32.0–35.9)
MONO%: 6.5 % (ref 0.0–13.0)
NEUT#: 4.9 10*3/uL (ref 1.5–6.5)
RDW: 14 % (ref 11.1–15.7)

## 2011-10-04 LAB — PROTIME-INR (CHCC SATELLITE): INR: 3.7 — ABNORMAL HIGH (ref 2.0–3.5)

## 2011-10-04 MED ORDER — DARBEPOETIN ALFA-POLYSORBATE 200 MCG/0.4ML IJ SOLN
200.0000 ug | Freq: Once | INTRAMUSCULAR | Status: AC
  Administered 2011-10-04: 200 ug via SUBCUTANEOUS

## 2011-10-04 NOTE — Telephone Encounter (Signed)
Patient called and left voice message stating he is currently being seen at [facility] for physical therapy for his hip, back and knee pain. He stated that he is having bilateral shoulder pain and would like to know if a referral could be placed to [place] for to work on upper body strength.

## 2011-10-04 NOTE — Patient Instructions (Signed)
Darbepoetin Alfa injection What is this medicine? DARBEPOETIN ALFA (dar be POE e tin AL fa) helps your body make more red blood cells. It is used to treat anemia caused by chronic kidney failure and chemotherapy. This medicine may be used for other purposes; ask your health care provider or pharmacist if you have questions. What should I tell my health care provider before I take this medicine? They need to know if you have any of these conditions: -blood clotting disorders or history of blood clots -cancer patient not on chemotherapy -cystic fibrosis -heart disease, such as angina, heart failure, or a history of a heart attack -hemoglobin level of 12 g/dL or greater -high blood pressure -low levels of folate, iron, or vitamin B12 -seizures -an unusual or allergic reaction to darbepoetin, erythropoietin, albumin, hamster proteins, latex, other medicines, foods, dyes, or preservatives -pregnant or trying to get pregnant -breast-feeding How should I use this medicine? This medicine is for injection into a vein or under the skin. It is usually given by a health care professional in a hospital or clinic setting. If you get this medicine at home, you will be taught how to prepare and give this medicine. Do not shake the solution before you withdraw a dose. Use exactly as directed. Take your medicine at regular intervals. Do not take your medicine more often than directed. It is important that you put your used needles and syringes in a special sharps container. Do not put them in a trash can. If you do not have a sharps container, call your pharmacist or healthcare provider to get one. Talk to your pediatrician regarding the use of this medicine in children. While this medicine may be used in children as young as 1 year for selected conditions, precautions do apply. Overdosage: If you think you have taken too much of this medicine contact a poison control center or emergency room at once. NOTE:  This medicine is only for you. Do not share this medicine with others. What if I miss a dose? If you miss a dose, take it as soon as you can. If it is almost time for your next dose, take only that dose. Do not take double or extra doses. What may interact with this medicine? Do not take this medicine with any of the following medications: -epoetin alfa This list may not describe all possible interactions. Give your health care provider a list of all the medicines, herbs, non-prescription drugs, or dietary supplements you use. Also tell them if you smoke, drink alcohol, or use illegal drugs. Some items may interact with your medicine. What should I watch for while using this medicine? Visit your prescriber or health care professional for regular checks on your progress and for the needed blood tests and blood pressure measurements. It is especially important for the doctor to make sure your hemoglobin level is in the desired range, to limit the risk of potential side effects and to give you the best benefit. Keep all appointments for any recommended tests. Check your blood pressure as directed. Ask your doctor what your blood pressure should be and when you should contact him or her. As your body makes more red blood cells, you may need to take iron, folic acid, or vitamin B supplements. Ask your doctor or health care provider which products are right for you. If you have kidney disease continue dietary restrictions, even though this medication can make you feel better. Talk with your doctor or health care professional about the   foods you eat and the vitamins that you take. What side effects may I notice from receiving this medicine? Side effects that you should report to your doctor or health care professional as soon as possible: -allergic reactions like skin rash, itching or hives, swelling of the face, lips, or tongue -breathing problems -changes in vision -chest pain -confusion, trouble speaking  or understanding -feeling faint or lightheaded, falls -high blood pressure -muscle aches or pains -pain, swelling, warmth in the leg -rapid weight gain -severe headaches -sudden numbness or weakness of the face, arm or leg -trouble walking, dizziness, loss of balance or coordination -seizures (convulsions) -swelling of the ankles, feet, hands -unusually weak or tired Side effects that usually do not require medical attention (report to your doctor or health care professional if they continue or are bothersome): -diarrhea -fever, chills (flu-like symptoms) -headaches -nausea, vomiting -redness, stinging, or swelling at site where injected This list may not describe all possible side effects. Call your doctor for medical advice about side effects. You may report side effects to FDA at 1-800-FDA-1088. Where should I keep my medicine? Keep out of the reach of children. Store in a refrigerator between 2 and 8 degrees C (36 and 46 degrees F). Do not freeze. Do not shake. Throw away any unused portion if using a single-dose vial. Throw away any unused medicine after the expiration date. NOTE: This sheet is a summary. It may not cover all possible information. If you have questions about this medicine, talk to your doctor, pharmacist, or health care provider.  2012, [employer]. (03/31/2008 10:23:57 AM)

## 2011-10-05 ENCOUNTER — Other Ambulatory Visit: Payer: Self-pay | Admitting: Internal Medicine

## 2011-10-05 DIAGNOSIS — M25519 Pain in unspecified shoulder: Secondary | ICD-10-CM

## 2011-10-05 LAB — TESTOSTERONE: Testosterone: 223.75 ng/dL — ABNORMAL LOW (ref 300–890)

## 2011-10-05 LAB — RETICULOCYTES (CHCC)
ABS Retic: 43.7 10*3/uL (ref 19.0–186.0)
RBC.: 3.64 MIL/uL — ABNORMAL LOW (ref 4.22–5.81)

## 2011-10-05 NOTE — Telephone Encounter (Signed)
Call placed to patient at [phone], no answer. A detailed voice message was left informing patient that PT was ordered.

## 2011-10-05 NOTE — Telephone Encounter (Signed)
Order placed

## 2011-10-05 NOTE — Telephone Encounter (Signed)
Ok fot PT

## 2011-10-17 ENCOUNTER — Encounter: Payer: Self-pay | Admitting: Cardiology

## 2011-10-30 ENCOUNTER — Ambulatory Visit (HOSPITAL_BASED_OUTPATIENT_CLINIC_OR_DEPARTMENT_OTHER): Payer: BC Managed Care – PPO

## 2011-10-30 ENCOUNTER — Other Ambulatory Visit: Payer: BC Managed Care – PPO | Admitting: Lab

## 2011-10-30 ENCOUNTER — Ambulatory Visit: Payer: BC Managed Care – PPO | Admitting: Hematology & Oncology

## 2011-10-30 ENCOUNTER — Ambulatory Visit (HOSPITAL_BASED_OUTPATIENT_CLINIC_OR_DEPARTMENT_OTHER): Payer: BC Managed Care – PPO | Admitting: Hematology & Oncology

## 2011-10-30 VITALS — BP 104/65 | HR 55 | Temp 97.4°F | Ht 73.0 in | Wt 299.0 lb

## 2011-10-30 DIAGNOSIS — C6290 Malignant neoplasm of unspecified testis, unspecified whether descended or undescended: Secondary | ICD-10-CM

## 2011-10-30 DIAGNOSIS — I4891 Unspecified atrial fibrillation: Secondary | ICD-10-CM

## 2011-10-30 DIAGNOSIS — D631 Anemia in chronic kidney disease: Secondary | ICD-10-CM

## 2011-10-30 DIAGNOSIS — D509 Iron deficiency anemia, unspecified: Secondary | ICD-10-CM

## 2011-10-30 DIAGNOSIS — Z7901 Long term (current) use of anticoagulants: Secondary | ICD-10-CM

## 2011-10-30 DIAGNOSIS — D649 Anemia, unspecified: Secondary | ICD-10-CM

## 2011-10-30 DIAGNOSIS — C61 Malignant neoplasm of prostate: Secondary | ICD-10-CM

## 2011-10-30 DIAGNOSIS — N289 Disorder of kidney and ureter, unspecified: Secondary | ICD-10-CM

## 2011-10-30 LAB — IRON AND TIBC
%SAT: 17 % — ABNORMAL LOW (ref 20–55)
Iron: 47 ug/dL (ref 42–165)
TIBC: 272 ug/dL (ref 215–435)
UIBC: 225 ug/dL (ref 125–400)

## 2011-10-30 LAB — CBC WITH DIFFERENTIAL (CANCER CENTER ONLY)
BASO%: 0.4 % (ref 0.0–2.0)
HCT: 31.4 % — ABNORMAL LOW (ref 38.7–49.9)
LYMPH#: 1.2 10*3/uL (ref 0.9–3.3)
MONO#: 0.5 10*3/uL (ref 0.1–0.9)
NEUT#: 3.8 10*3/uL (ref 1.5–6.5)
NEUT%: 67.5 % (ref 40.0–80.0)
RDW: 14.2 % (ref 11.1–15.7)
WBC: 5.6 10*3/uL (ref 4.0–10.0)

## 2011-10-30 LAB — BASIC METABOLIC PANEL
CO2: 24 mEq/L (ref 19–32)
Chloride: 103 mEq/L (ref 96–112)
Creatinine, Ser: 1.99 mg/dL — ABNORMAL HIGH (ref 0.50–1.35)
Potassium: 4.9 mEq/L (ref 3.5–5.3)
Sodium: 140 mEq/L (ref 135–145)

## 2011-10-30 LAB — PROTIME-INR (CHCC SATELLITE)
INR: 4.3 — ABNORMAL HIGH (ref 2.0–3.5)
Protime: 51.6 Seconds — ABNORMAL HIGH (ref 10.6–13.4)

## 2011-10-30 MED ORDER — DARBEPOETIN ALFA-POLYSORBATE 200 MCG/0.4ML IJ SOLN
200.0000 ug | Freq: Once | INTRAMUSCULAR | Status: AC
  Administered 2011-10-30: 200 ug via SUBCUTANEOUS

## 2011-10-30 NOTE — Progress Notes (Signed)
This office note has been dictated.

## 2011-10-30 NOTE — Addendum Note (Signed)
Addended by: [name] on: 10/30/2011 06:00 PM   Modules accepted: Orders

## 2011-10-30 NOTE — Addendum Note (Signed)
Addended by: [name] on: 10/30/2011 10:56 AM   Modules accepted: Orders, SmartSet

## 2011-10-31 ENCOUNTER — Telehealth: Payer: Self-pay | Admitting: Hematology & Oncology

## 2011-10-31 NOTE — Telephone Encounter (Signed)
Pt called moved his aug,oct and sept appointments due to work schedule

## 2011-11-01 ENCOUNTER — Other Ambulatory Visit: Payer: BC Managed Care – PPO | Admitting: Lab

## 2011-11-01 ENCOUNTER — Ambulatory Visit: Payer: BC Managed Care – PPO | Admitting: Hematology & Oncology

## 2011-11-06 ENCOUNTER — Telehealth: Payer: Self-pay | Admitting: *Deleted

## 2011-11-06 ENCOUNTER — Other Ambulatory Visit: Payer: Self-pay | Admitting: *Deleted

## 2011-11-06 DIAGNOSIS — D630 Anemia in neoplastic disease: Secondary | ICD-10-CM

## 2011-11-06 NOTE — Telephone Encounter (Signed)
Message copied by Rico Ala on Mon Nov 06, 2011 10:56 AM ------      Message from: [name]      Created: Mon Nov 06, 2011 10:11 AM       Call - Iron is dropping.  Need to give FeraHeme 510mg  x 1 dose.  Please set up at patient's convenience.  Calvin E.

## 2011-11-06 NOTE — Progress Notes (Signed)
CC:   [name], MD  DIAGNOSES: 1. Anemia secondary to renal insufficiency. 2. Intermittent iron-deficiency anemia. 3. Hypotestosteronemia. 4. 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 5. History of seminoma.  CURRENT THERAPY: 1. Aranesp 200 mcg subcu q.4 weeks for hemoglobin less than 11. 2. IV iron as indicated. 3. Topical testosterone replacement therapy. 4. Lifelong Coumadin to maintain INR between 2.5-3.5.  INTERIM HISTORY:  Mr. Mcmahen comes in for his followup.  He feels a little better.  We did start him on Aranesp, finally.  He has got 1 dose to date.  He has had no problems with obvious bleeding.  He has had no chest pain.  His last iron was given back in February.  He is getting ready to go on, I think, a 2 week trip up to [place].  He has to go back to [facility] for his cardiac appointment and followup.  When we last saw him, his ferritin was 644.  His iron saturation was 26%.  His last testosterone checked back in June was __________.  This apparently is going down.  PHYSICAL EXAMINATION:  General: This is an obese, white, gentleman in no obvious distress.  Vital signs:  Temperature of 97.4, pulse 55, respiratory rate 18, blood pressure 104/65.  Weight is 299.  Head and neck:  Normocephalic, atraumatic skull.  There are no ocular or oral lesions.  There are no palpable cervical or supraclavicular lymph nodes. Lungs:  Clear bilaterally.  Cardiac:  Regular rate and rhythm with a normal S1, S2.  He has a systolic click secondary to the prostatic valve.  Abdomen:  Soft with good bowel sounds.  There is no palpable abdominal mass.  There is no palpable hepatosplenomegaly.  Back:  No tenderness over the spine, ribs, or hips.  Extremities:  Show no clubbing, cyanosis, or edema.  Neurological:  Shows no focal neurological deficits.  LABORATORY STUDIES:  White cell count 5.6, hemoglobin 10.6, hematocrit 31.4, platelet count  270.  His BUN is __________, creatinine is 1.99. His iron saturation is 17% with iron of 47.  IMPRESSION:  Mr. Berard is a 56 year old gentleman with multifactorial anemia.  His hemoglobin is a little better today.  He will get another dose of Aranesp.  His testosterone level is going back down.  He is doing the __________ daily.  We may have to increase how much he takes.  His renal function is a little bit worse today.  I am not sure as to why that might be.  He does see a nephrologist for this.  We have checked his labs every month.  His pro time today was 4.3.  I am not going to make adjustments to his Coumadin.  I will plan to see him back myself in another 3 months.    ______________________________ Volanda Napoleon, M.D. PRE/MEDQ  D:  10/30/2011  T:  10/31/2011  Job:  [id number]

## 2011-11-06 NOTE — Telephone Encounter (Signed)
Called patient to let him know his iron is dropping per dr. Marin Olp. Needs one dose of Feraheme 510 mg.  Appt set up for July 19th at [place].

## 2011-11-17 ENCOUNTER — Ambulatory Visit (HOSPITAL_BASED_OUTPATIENT_CLINIC_OR_DEPARTMENT_OTHER): Payer: BC Managed Care – PPO

## 2011-11-17 VITALS — BP 111/55 | Temp 97.1°F

## 2011-11-17 DIAGNOSIS — D509 Iron deficiency anemia, unspecified: Secondary | ICD-10-CM

## 2011-11-17 DIAGNOSIS — C61 Malignant neoplasm of prostate: Secondary | ICD-10-CM

## 2011-11-17 MED ORDER — FERUMOXYTOL INJECTION 510 MG/17 ML: 510.0000 mg | Freq: Once | INTRAVENOUS | Status: DC

## 2011-11-17 MED ORDER — SODIUM CHLORIDE 0.9 % IV SOLN
Freq: Once | INTRAVENOUS | Status: AC
  Administered 2011-11-17: 14:00:00 via INTRAVENOUS

## 2011-11-17 MED ORDER — FERUMOXYTOL INJECTION 510 MG/17 ML
510.0000 mg | Freq: Once | INTRAVENOUS | Status: AC
  Administered 2011-11-17: 510 mg via INTRAVENOUS
  Filled 2011-11-17: qty 17

## 2011-11-17 NOTE — Patient Instructions (Signed)
Ferumoxytol injection What is this medicine? FERUMOXYTOL is an iron complex. Iron is used to make healthy red blood cells, which carry oxygen and nutrients throughout the body. This medicine is used to treat iron deficiency anemia in people with chronic kidney disease. This medicine may be used for other purposes; ask your health care provider or pharmacist if you have questions. What should I tell my health care provider before I take this medicine? They need to know if you have any of these conditions: -anemia not caused by low iron levels -high levels of iron in the blood -magnetic resonance imaging (MRI) test scheduled -an unusual or allergic reaction to iron, other medicines, foods, dyes, or preservatives -pregnant or trying to get pregnant -breast-feeding How should I use this medicine? This medicine is for infusion into a vein. It is given by a health care professional in a hospital or clinic setting. Talk to your pediatrician regarding the use of this medicine in children. Special care may be needed. Overdosage: If you think you've taken too much of this medicine contact a poison control center or emergency room at once. Overdosage: If you think you have taken too much of this medicine contact a poison control center or emergency room at once. NOTE: This medicine is only for you. Do not share this medicine with others. What if I miss a dose? It is important not to miss your dose. Call your doctor or health care professional if you are unable to keep an appointment. What may interact with this medicine? This medicine may interact with the following medications: -other iron products This list may not describe all possible interactions. Give your health care provider a list of all the medicines, herbs, non-prescription drugs, or dietary supplements you use. Also tell them if you smoke, drink alcohol, or use illegal drugs. Some items may interact with your medicine. What should I watch  for while using this medicine? Visit your doctor or healthcare professional regularly. Tell your doctor or healthcare professional if your symptoms do not start to get better or if they get worse. You may need blood work done while you are taking this medicine. You may need to follow a special diet. Talk to your doctor. Foods that contain iron include: whole grains/cereals, dried fruits, beans, or peas, leafy green vegetables, and organ meats (liver, kidney). What side effects may I notice from receiving this medicine? Side effects that you should report to your doctor or health care professional as soon as possible: -allergic reactions like skin rash, itching or hives, swelling of the face, lips, or tongue -breathing problems -changes in blood pressure -feeling faint or lightheaded, falls -fever or chills -flushing, sweating, or hot feelings -swelling of the ankles or feet Side effects that usually do not require medical attention (Report these to your doctor or health care professional if they continue or are bothersome.): -diarrhea -headache -nausea, vomiting -stomach pain This list may not describe all possible side effects. Call your doctor for medical advice about side effects. You may report side effects to FDA at 1-800-FDA-1088. Where should I keep my medicine? This drug is given in a hospital or clinic and will not be stored at home. NOTE: This sheet is a summary. It may not cover all possible information. If you have questions about this medicine, talk to your doctor, pharmacist, or health care provider.  2012, Elsevier/Gold Standard. (01/08/2008 9:48:25 PM) 

## 2011-11-20 ENCOUNTER — Encounter: Payer: Self-pay | Admitting: Cardiology

## 2011-11-23 ENCOUNTER — Encounter: Payer: Self-pay | Admitting: Internal Medicine

## 2011-11-23 ENCOUNTER — Ambulatory Visit (INDEPENDENT_AMBULATORY_CARE_PROVIDER_SITE_OTHER): Payer: BC Managed Care – PPO | Admitting: Internal Medicine

## 2011-11-23 VITALS — BP 114/68 | HR 92 | Temp 97.6°F | Resp 16 | Wt 300.0 lb

## 2011-11-23 DIAGNOSIS — M25569 Pain in unspecified knee: Secondary | ICD-10-CM

## 2011-11-23 DIAGNOSIS — M25519 Pain in unspecified shoulder: Secondary | ICD-10-CM

## 2011-11-23 DIAGNOSIS — C61 Malignant neoplasm of prostate: Secondary | ICD-10-CM

## 2011-11-23 DIAGNOSIS — D630 Anemia in neoplastic disease: Secondary | ICD-10-CM

## 2011-11-23 MED ORDER — HYDROCODONE-ACETAMINOPHEN 5-500 MG PO TABS: 1.0000 | ORAL_TABLET | Freq: Four times a day (QID) | ORAL | Status: DC | PRN

## 2011-11-23 NOTE — Progress Notes (Signed)
  Subjective:    Patient ID: Calvin [name], male    DOB: [birth year], 56 y.o.   MRN: [id number]  HPI Pt presents to clinic for evaluation of knee pain. S/p [facility] follow up with persistent anemia s/p EGD and colonoscopy. Was recommended for pill endoscopy. Knee pain and shoulder pain persistent despite PT. Cannot take nsaids due to CRI and coumadin use. No alleviating or exacerbating factors.  Past Medical History  Diagnosis Date  . Anemia   . 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 HTN (hypertension)   . CAD (coronary artery disease)   . Aortic aneurysm   . Gout   . Atrial fibrillation   . Anemia   . CKD (chronic kidney disease)     baseline creatinine is 1.5-2  . Hyperlipidemia   . Atrial flutter   . Obesity   . Anemia of renal disease 10/04/2011   Past Surgical History  Procedure Date  . Coarctation of aorta repair 1958  . Coarctation of aorta repair 1970  . Aortic valve replacement and mitral valve repair 1974    Bicuspid aortic valve  . Orchiectomy 1992    testicular cancer  . Aortic valve replacement 1995    [facility]  . Tonsillectomy     reports that he has quit smoking. He has never used smokeless tobacco. He reports that he drinks alcohol. He reports that he does not use illicit drugs. family history includes Heart disease in an unspecified family member. Allergies  Allergen Reactions  . Ace Inhibitors      Review of Systems see hpi     Objective:   Physical Exam  Nursing note and vitals reviewed. Constitutional: He appears well-developed and well-nourished. No distress.  Neurological: He is alert.  Skin: He is not diaphoretic.  Psychiatric: He has a normal mood and affect.          Assessment & Plan:

## 2011-11-24 ENCOUNTER — Other Ambulatory Visit: Payer: Self-pay | Admitting: *Deleted

## 2011-11-24 DIAGNOSIS — E349 Endocrine disorder, unspecified: Secondary | ICD-10-CM

## 2011-11-24 DIAGNOSIS — C6290 Malignant neoplasm of unspecified testis, unspecified whether descended or undescended: Secondary | ICD-10-CM

## 2011-11-24 MED ORDER — TESTOSTERONE 10 MG/ACT (2%) TD GEL: 40.0000 mg | Freq: Every day | TRANSDERMAL | Status: DC

## 2011-11-24 NOTE — Telephone Encounter (Signed)
Received refill request for Fortesta Gel from [facility]. Faxed back once signed by Dr Marin Olp.

## 2011-11-27 ENCOUNTER — Other Ambulatory Visit: Payer: BC Managed Care – PPO | Admitting: Lab

## 2011-11-27 ENCOUNTER — Ambulatory Visit: Payer: BC Managed Care – PPO

## 2011-11-27 ENCOUNTER — Ambulatory Visit: Payer: BC Managed Care – PPO | Admitting: Family Medicine

## 2011-12-03 DIAGNOSIS — M25569 Pain in unspecified knee: Secondary | ICD-10-CM | POA: Insufficient documentation

## 2011-12-03 NOTE — Assessment & Plan Note (Signed)
Recommended for pill endoscopy. Proceed with GI evaluation.

## 2011-12-03 NOTE — Assessment & Plan Note (Signed)
Proceed with orthopedic referral. Avoid nsaids.

## 2011-12-04 ENCOUNTER — Other Ambulatory Visit: Payer: BC Managed Care – PPO | Admitting: Lab

## 2011-12-04 ENCOUNTER — Ambulatory Visit (HOSPITAL_BASED_OUTPATIENT_CLINIC_OR_DEPARTMENT_OTHER): Payer: BC Managed Care – PPO

## 2011-12-04 DIAGNOSIS — I4891 Unspecified atrial fibrillation: Secondary | ICD-10-CM

## 2011-12-04 DIAGNOSIS — D509 Iron deficiency anemia, unspecified: Secondary | ICD-10-CM

## 2011-12-04 LAB — CBC WITH DIFFERENTIAL (CANCER CENTER ONLY)
BASO#: 0 10*3/uL (ref 0.0–0.2)
BASO%: 0.6 % (ref 0.0–2.0)
EOS%: 2.2 % (ref 0.0–7.0)
HCT: 34.1 % — ABNORMAL LOW (ref 38.7–49.9)
HGB: 11.7 g/dL — ABNORMAL LOW (ref 13.0–17.1)
LYMPH%: 17.5 % (ref 14.0–48.0)
MCHC: 34.3 g/dL (ref 32.0–35.9)
MCV: 83 fL (ref 82–98)
NEUT%: 72.1 % (ref 40.0–80.0)
RDW: 14.1 % (ref 11.1–15.7)

## 2011-12-07 ENCOUNTER — Telehealth: Payer: Self-pay | Admitting: *Deleted

## 2011-12-07 NOTE — Telephone Encounter (Signed)
Message copied by Rico Ala on Thu Dec 07, 2011  9:52 AM ------      Message from: Volanda Napoleon      Created: Tue Dec 05, 2011  5:48 PM       Call - hgb is much better!!! Protime is perfect!!!! Laurey Arrow E.

## 2011-12-07 NOTE — Telephone Encounter (Signed)
Called patien tto let him know that his Hgb is much better and protime is perfect!

## 2011-12-12 ENCOUNTER — Ambulatory Visit
Admission: RE | Admit: 2011-12-12 | Discharge: 2011-12-12 | Disposition: A | Discharge status: Home / Self Care | Payer: BC Managed Care – PPO | Source: Ambulatory Visit | Attending: Sports Medicine | Admitting: Sports Medicine

## 2011-12-12 ENCOUNTER — Ambulatory Visit (INDEPENDENT_AMBULATORY_CARE_PROVIDER_SITE_OTHER): Payer: BC Managed Care – PPO | Admitting: Sports Medicine

## 2011-12-12 VITALS — BP 132/70 | Ht 74.0 in | Wt 298.0 lb

## 2011-12-12 DIAGNOSIS — M545 Low back pain, unspecified: Secondary | ICD-10-CM

## 2011-12-12 DIAGNOSIS — M25569 Pain in unspecified knee: Secondary | ICD-10-CM

## 2011-12-12 NOTE — Progress Notes (Signed)
  Subjective:    Patient ID: Calvin [name], male    DOB: June 20, 1955, 56 y.o.   MRN: [id number]  HPI chief complaint: Bilateral shoulder pain, left knee pain, right lower leg pain  Pleasant 56 year old male comes in today with multiple complaints. Medical history is significant for coronary artery disease and stage III renal disease. He presents today with multiple aches and pains. He's complaining of diffuse discomfort across his low back with radiating pain into the right leg. Pain at times can be debilitating. Is also complaining of bilateral anterior shoulder pain. He has to use his arms to help himself get up from a seated position and feels like this is what caused his shoulder pain. No trauma. In regards to the left knee, he gets intermittent pain mainly while driving. It is along the medial joint space. No swelling, no mechanical symptoms. He has been attending physical therapy at [facility] for all the above complaints and is making good progress. In fact, his primary care physician Dr. Daniel Nones has recently ordered more visits. He is here today with his wife. They have moved back to [place] after living in [place] for a few years  Medications include Tylenol, Norvasc, Catapres, Flexeril, Lasix, Vicodin, labetalol, Toprol, Benicar, Viagra, testosterone gel, and warfarin He is allergic to ACE inhibitors Socially, he is a former smoker    Review of Systems     Objective:   Physical Exam Well-developed, well-nourished. No acute distress. Awake alert and oriented x3.  Lumbar spine shows pain both with forward flexion and extension. No tenderness along the lumbar midline and no appreciable spasm. Negative straight leg raise bilaterally. No noticeable muscle atrophy. Hips demonstrate full external rotation, and full internal rotation on the left but internal rotation is limited to about 30 on the right, negative log roll.  Left knee shows range of motion from 0-120. No effusion. He  is tender to palpation along the medial joint line with pain but no popping with McMurray's. No tenderness laterally. Good ligamentous stability.  He has 1+ pitting edema bilaterally halfway up the lower legs. He walks with a slight limp with the right foot externally rotated.       Assessment & Plan:  1. Low back pain 2. Right knee pain likely secondary to degenerative medial meniscal tear 3. Diffuse myalgias 4. Chronic renal insufficiency 5. Coronary artery disease 6. Atrial fibrillation  Patient has multiple comorbidities. His renal disease is likely a triggering factor to his diffuse aches and pains. I would like to start with some simple blood work... sedimentation rate, C-reactive protein, ANA, and rheumatoid factor.... just to rule out inflammatory arthropathy. Going to also get x-rays of his lumbar spine, pelvis, and left knee. Patient will followup with me in one week. I discussed the possibility of a cortisone injection into the left knee if symptoms warrant. I am obviously going to be limited with oral medications.

## 2011-12-19 ENCOUNTER — Ambulatory Visit (INDEPENDENT_AMBULATORY_CARE_PROVIDER_SITE_OTHER): Payer: BC Managed Care – PPO | Admitting: Sports Medicine

## 2011-12-19 VITALS — BP 110/60

## 2011-12-19 DIAGNOSIS — M5137 Other intervertebral disc degeneration, lumbosacral region: Secondary | ICD-10-CM

## 2011-12-19 DIAGNOSIS — M25569 Pain in unspecified knee: Secondary | ICD-10-CM

## 2011-12-19 DIAGNOSIS — N289 Disorder of kidney and ureter, unspecified: Secondary | ICD-10-CM

## 2011-12-19 DIAGNOSIS — M5136 Other intervertebral disc degeneration, lumbar region: Secondary | ICD-10-CM

## 2011-12-19 NOTE — Progress Notes (Signed)
  Subjective:    Patient ID: Calvin [name], male    DOB: 1955-07-31, 56 y.o.   MRN: [id number]  HPI patient comes in today for followup. He is here to discuss x-ray results and recent blood work. X-rays of his left knee shows no significant degenerative changes. X-rays of his hips also show no significant degenerative changes. Lumbar spine films however show a marked decrease in disc space at L5-S1 with concomitant facet arthropathy. He also has disc space bearing at L3-L4. Nothing acute was seen. His blood work was also reviewed. He has an elevated C-reactive protein of 26.4 and a slightly elevated rheumatoid factor at 19.8. I also ordered a sedimentation rate but apparently it was not done. Today he states his low back pain is tolerable left knee pain persists mainly with driving.    Review of Systems     Objective:   Physical Exam Well-developed, well-nourished. Left knee shows full motion without effusion still tenderness to palpation along the medial joint line. Limited lumbar mobility No focal neurological deficits grossly of either lower extremity       Assessment & Plan:  1. Left knee pain likely secondary to degenerative medial meniscal tear 2. Low back pain secondary to lumbar degenerative disc 3. Elevated CRP and rheumatoid factor 4. renal insufficiency 5. Atrial fibrillation  I discussed the merits of an intra-articular cortisone injection for his left knee pain but he wants to hold on that for now. Instead we will try a simple body helix compression sleeve and he will use it mainly with driving. Patient is severely claustrophobic and is concerned about an MRI for his lumbar spine. Instead, I have recommended a referral to Dr.Ibazebo to discuss further treatment. He is currently in physical therapy and he understands the importance of a lifelong exercise regimen. He may benefit from a trial of lumbar ESI and he would like for me to refer him in the future I would be happy to do  so. I've also recommended a referral to rheumatology for his elevated CRP and rheumatoid factor. He would like to discuss all this further with his wife and his nephrologist. I will remain available to see him on an as-needed basis.

## 2011-12-25 ENCOUNTER — Ambulatory Visit: Payer: BC Managed Care – PPO

## 2011-12-25 ENCOUNTER — Other Ambulatory Visit: Payer: BC Managed Care – PPO | Admitting: Lab

## 2011-12-29 ENCOUNTER — Telehealth: Payer: Self-pay | Admitting: Internal Medicine

## 2011-12-29 NOTE — Telephone Encounter (Signed)
Refill- cyclobenzaprine 10mg . Take one tablet by mouth three times daily. Qty 90

## 2011-12-29 NOTE — Telephone Encounter (Signed)
Active Rx at 07.25.13 OV: Cyclobenzaprine 10 mg Sig: Take 5 mg at bedtime PRN.? Knee pain - Hod[place] Derrek Gu, MD 12/03/2011 11:44 AM Signed  Proceed with orthopedic referral. Avoid nsaids.  DENIED; called pharmacy and they received manual request per patient for Increase in Cyclobenzaprine 10 mg to [1] tablet TID.? New Pt 12.20.12, never managed this medication for patient/SLS

## 2012-01-02 ENCOUNTER — Ambulatory Visit (HOSPITAL_BASED_OUTPATIENT_CLINIC_OR_DEPARTMENT_OTHER): Payer: BC Managed Care – PPO

## 2012-01-02 ENCOUNTER — Other Ambulatory Visit: Payer: Self-pay | Admitting: Hematology & Oncology

## 2012-01-02 ENCOUNTER — Ambulatory Visit (HOSPITAL_BASED_OUTPATIENT_CLINIC_OR_DEPARTMENT_OTHER): Payer: BC Managed Care – PPO | Admitting: Lab

## 2012-01-02 VITALS — BP 126/72 | HR 64 | Temp 96.7°F | Resp 20

## 2012-01-02 DIAGNOSIS — N289 Disorder of kidney and ureter, unspecified: Secondary | ICD-10-CM

## 2012-01-02 DIAGNOSIS — C6290 Malignant neoplasm of unspecified testis, unspecified whether descended or undescended: Secondary | ICD-10-CM

## 2012-01-02 DIAGNOSIS — D649 Anemia, unspecified: Secondary | ICD-10-CM

## 2012-01-02 DIAGNOSIS — C61 Malignant neoplasm of prostate: Secondary | ICD-10-CM

## 2012-01-02 DIAGNOSIS — N189 Chronic kidney disease, unspecified: Secondary | ICD-10-CM

## 2012-01-02 DIAGNOSIS — I4891 Unspecified atrial fibrillation: Secondary | ICD-10-CM

## 2012-01-02 DIAGNOSIS — D631 Anemia in chronic kidney disease: Secondary | ICD-10-CM

## 2012-01-02 LAB — CBC WITH DIFFERENTIAL (CANCER CENTER ONLY)
BASO#: 0 10e3/uL (ref 0.0–0.2)
BASO%: 0.3 % (ref 0.0–2.0)
EOS%: 2.3 % (ref 0.0–7.0)
Eosinophils Absolute: 0.1 10e3/uL (ref 0.0–0.5)
HCT: 30.7 % — ABNORMAL LOW (ref 38.7–49.9)
HGB: 10.5 g/dL — ABNORMAL LOW (ref 13.0–17.1)
LYMPH#: 1 10e3/uL (ref 0.9–3.3)
LYMPH%: 17.1 % (ref 14.0–48.0)
MCH: 28.3 pg (ref 28.0–33.4)
MCHC: 34.2 g/dL (ref 32.0–35.9)
MCV: 83 fL (ref 82–98)
MONO#: 0.4 10e3/uL (ref 0.1–0.9)
MONO%: 6.6 % (ref 0.0–13.0)
NEUT#: 4.2 10e3/uL (ref 1.5–6.5)
NEUT%: 73.7 % (ref 40.0–80.0)
Platelets: 261 10e3/uL (ref 145–400)
RBC: 3.71 10e6/uL — ABNORMAL LOW (ref 4.20–5.70)
RDW: 14.4 % (ref 11.1–15.7)
WBC: 5.7 10e3/uL (ref 4.0–10.0)

## 2012-01-02 LAB — PROTIME-INR (CHCC SATELLITE)
INR: 3.1 (ref 2.0–3.5)
Protime: 37.2 s — ABNORMAL HIGH (ref 10.6–13.4)

## 2012-01-02 LAB — BASIC METABOLIC PANEL
BUN: 37 mg/dL — ABNORMAL HIGH (ref 6–23)
Potassium: 4.8 mEq/L (ref 3.5–5.3)
Sodium: 140 mEq/L (ref 135–145)

## 2012-01-02 MED ORDER — DARBEPOETIN ALFA-POLYSORBATE 200 MCG/0.4ML IJ SOLN
200.0000 ug | Freq: Once | INTRAMUSCULAR | Status: AC
  Administered 2012-01-02: 200 ug via SUBCUTANEOUS

## 2012-01-02 NOTE — Patient Instructions (Signed)
Darbepoetin Alfa injection What is this medicine? DARBEPOETIN ALFA (dar be POE e tin AL fa) helps your body make more red blood cells. It is used to treat anemia caused by chronic kidney failure and chemotherapy. This medicine may be used for other purposes; ask your health care provider or pharmacist if you have questions. What should I tell my health care provider before I take this medicine? They need to know if you have any of these conditions: -blood clotting disorders or history of blood clots -cancer patient not on chemotherapy -cystic fibrosis -heart disease, such as angina, heart failure, or a history of a heart attack -hemoglobin level of 12 g/dL or greater -high blood pressure -low levels of folate, iron, or vitamin B12 -seizures -an unusual or allergic reaction to darbepoetin, erythropoietin, albumin, hamster proteins, latex, other medicines, foods, dyes, or preservatives -pregnant or trying to get pregnant -breast-feeding How should I use this medicine? This medicine is for injection into a vein or under the skin. It is usually given by a health care professional in a hospital or clinic setting. If you get this medicine at home, you will be taught how to prepare and give this medicine. Do not shake the solution before you withdraw a dose. Use exactly as directed. Take your medicine at regular intervals. Do not take your medicine more often than directed. It is important that you put your used needles and syringes in a special sharps container. Do not put them in a trash can. If you do not have a sharps container, call your pharmacist or healthcare provider to get one. Talk to your pediatrician regarding the use of this medicine in children. While this medicine may be used in children as young as 1 year for selected conditions, precautions do apply. Overdosage: If you think you have taken too much of this medicine contact a poison control center or emergency room at once. NOTE:  This medicine is only for you. Do not share this medicine with others. What if I miss a dose? If you miss a dose, take it as soon as you can. If it is almost time for your next dose, take only that dose. Do not take double or extra doses. What may interact with this medicine? Do not take this medicine with any of the following medications: -epoetin alfa This list may not describe all possible interactions. Give your health care provider a list of all the medicines, herbs, non-prescription drugs, or dietary supplements you use. Also tell them if you smoke, drink alcohol, or use illegal drugs. Some items may interact with your medicine. What should I watch for while using this medicine? Visit your prescriber or health care professional for regular checks on your progress and for the needed blood tests and blood pressure measurements. It is especially important for the doctor to make sure your hemoglobin level is in the desired range, to limit the risk of potential side effects and to give you the best benefit. Keep all appointments for any recommended tests. Check your blood pressure as directed. Ask your doctor what your blood pressure should be and when you should contact him or her. As your body makes more red blood cells, you may need to take iron, folic acid, or vitamin B supplements. Ask your doctor or health care provider which products are right for you. If you have kidney disease continue dietary restrictions, even though this medication can make you feel better. Talk with your doctor or health care professional about the   foods you eat and the vitamins that you take. What side effects may I notice from receiving this medicine? Side effects that you should report to your doctor or health care professional as soon as possible: -allergic reactions like skin rash, itching or hives, swelling of the face, lips, or tongue -breathing problems -changes in vision -chest pain -confusion, trouble speaking  or understanding -feeling faint or lightheaded, falls -high blood pressure -muscle aches or pains -pain, swelling, warmth in the leg -rapid weight gain -severe headaches -sudden numbness or weakness of the face, arm or leg -trouble walking, dizziness, loss of balance or coordination -seizures (convulsions) -swelling of the ankles, feet, hands -unusually weak or tired Side effects that usually do not require medical attention (report to your doctor or health care professional if they continue or are bothersome): -diarrhea -fever, chills (flu-like symptoms) -headaches -nausea, vomiting -redness, stinging, or swelling at site where injected This list may not describe all possible side effects. Call your doctor for medical advice about side effects. You may report side effects to FDA at 1-800-FDA-1088. Where should I keep my medicine? Keep out of the reach of children. Store in a refrigerator between 2 and 8 degrees C (36 and 46 degrees F). Do not freeze. Do not shake. Throw away any unused portion if using a single-dose vial. Throw away any unused medicine after the expiration date. NOTE: This sheet is a summary. It may not cover all possible information. If you have questions about this medicine, talk to your doctor, pharmacist, or health care provider.  2012, [employer]. (03/31/2008 10:23:57 AM)

## 2012-01-29 ENCOUNTER — Ambulatory Visit: Payer: BC Managed Care – PPO | Admitting: Hematology & Oncology

## 2012-01-29 ENCOUNTER — Ambulatory Visit: Payer: BC Managed Care – PPO

## 2012-01-29 ENCOUNTER — Other Ambulatory Visit: Payer: BC Managed Care – PPO | Admitting: Lab

## 2012-02-01 ENCOUNTER — Encounter: Payer: Self-pay | Admitting: *Deleted

## 2012-02-01 NOTE — Patient Instructions (Addendum)
DR Gavin Pound RHEUMATOLOGIST [phone] FAXED NOTES AND REFERRAL SHEET TO DR [facility] OFFICE AT [phone]

## 2012-02-02 ENCOUNTER — Ambulatory Visit: Payer: BC Managed Care – PPO

## 2012-02-02 ENCOUNTER — Other Ambulatory Visit (HOSPITAL_BASED_OUTPATIENT_CLINIC_OR_DEPARTMENT_OTHER): Payer: BC Managed Care – PPO | Admitting: Lab

## 2012-02-02 ENCOUNTER — Telehealth: Payer: Self-pay | Admitting: Cardiology

## 2012-02-02 ENCOUNTER — Ambulatory Visit (HOSPITAL_BASED_OUTPATIENT_CLINIC_OR_DEPARTMENT_OTHER): Payer: BC Managed Care – PPO | Admitting: Hematology & Oncology

## 2012-02-02 VITALS — BP 108/53 | HR 97 | Temp 97.4°F | Resp 22 | Ht 74.0 in | Wt 301.0 lb

## 2012-02-02 DIAGNOSIS — I4891 Unspecified atrial fibrillation: Secondary | ICD-10-CM

## 2012-02-02 DIAGNOSIS — D631 Anemia in chronic kidney disease: Secondary | ICD-10-CM

## 2012-02-02 DIAGNOSIS — D509 Iron deficiency anemia, unspecified: Secondary | ICD-10-CM

## 2012-02-02 DIAGNOSIS — N289 Disorder of kidney and ureter, unspecified: Secondary | ICD-10-CM

## 2012-02-02 DIAGNOSIS — E291 Testicular hypofunction: Secondary | ICD-10-CM

## 2012-02-02 DIAGNOSIS — D649 Anemia, unspecified: Secondary | ICD-10-CM

## 2012-02-02 DIAGNOSIS — C6290 Malignant neoplasm of unspecified testis, unspecified whether descended or undescended: Secondary | ICD-10-CM

## 2012-02-02 DIAGNOSIS — C61 Malignant neoplasm of prostate: Secondary | ICD-10-CM

## 2012-02-02 LAB — CBC WITH DIFFERENTIAL (CANCER CENTER ONLY)
BASO#: 0.1 10*3/uL (ref 0.0–0.2)
BASO%: 0.7 % (ref 0.0–2.0)
EOS%: 2.4 % (ref 0.0–7.0)
Eosinophils Absolute: 0.2 10*3/uL (ref 0.0–0.5)
HCT: 32.5 % — ABNORMAL LOW (ref 38.7–49.9)
HGB: 11.2 g/dL — ABNORMAL LOW (ref 13.0–17.1)
LYMPH#: 1.3 10*3/uL (ref 0.9–3.3)
LYMPH%: 18 % (ref 14.0–48.0)
MCH: 27.9 pg — ABNORMAL LOW (ref 28.0–33.4)
MCHC: 34.5 g/dL (ref 32.0–35.9)
MCV: 81 fL — ABNORMAL LOW (ref 82–98)
MONO#: 0.5 10*3/uL (ref 0.1–0.9)
MONO%: 7 % (ref 0.0–13.0)
NEUT#: 5 10*3/uL (ref 1.5–6.5)
NEUT%: 71.9 % (ref 40.0–80.0)
Platelets: 292 10*3/uL (ref 145–400)
RBC: 4.02 10*6/uL — ABNORMAL LOW (ref 4.20–5.70)
RDW: 14.5 % (ref 11.1–15.7)
WBC: 7 10*3/uL (ref 4.0–10.0)

## 2012-02-02 LAB — PROTIME-INR (CHCC SATELLITE): INR: 3.3 (ref 2.0–3.5)

## 2012-02-02 LAB — BASIC METABOLIC PANEL
Chloride: 106 mEq/L (ref 96–112)
Glucose, Bld: 86 mg/dL (ref 70–99)
Potassium: 4.6 mEq/L (ref 3.5–5.3)
Sodium: 139 mEq/L (ref 135–145)

## 2012-02-02 LAB — FERRITIN: Ferritin: 718 ng/mL — ABNORMAL HIGH (ref 22–322)

## 2012-02-02 LAB — IRON AND TIBC
%SAT: 25 % (ref 20–55)
Iron: 60 ug/dL (ref 42–165)
TIBC: 236 ug/dL (ref 215–435)
UIBC: 176 ug/dL (ref 125–400)

## 2012-02-02 NOTE — Progress Notes (Signed)
CC:   Calvin Aver, MD  DIAGNOSES: 1. Anemia secondary to renal insufficiency. 2. 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 3. Hypotestosteronemia. 4. History of seminoma.  CURRENT THERAPY: 1. Aranesp 200 mcg subcu as needed for hemoglobin less than 11. 2. IV iron as indicated. 3. Lifelong Coumadin. 4. Topical testosterone replacement therapy.  INTERIM HISTORY:  Calvin [name] comes in for followup.  He is doing okay. His back hurts him a little bit.  He said he has had lower back issues before.  He has had some fatigue.  He does feel a little bit better with the I think Aranesp.  He also has felt better with the testosterone replacement.  He has had no bleeding.  He said he has had some occasional leg swelling.  His last iron studies done back in July showed a ferritin of 522 with iron saturation 17%.  A lot of the ferritin elevation is an acute phase reactant.  His last iron was given back on 07/19 with 510 mg.  He has been traveling.  He is back now for a while.  He is trying to exercise a little bit more.  He is gaining weight.  PHYSICAL EXAMINATION:  General:  This is a mildly obese white gentleman in no obvious distress.  Vital signs:  97.4, pulse 97, respiratory rate 22, blood pressure 108/53.  Weight is 301.  Head and neck: Normocephalic, atraumatic skull.  There are no ocular or oral lesions. There are no palpable cervical or supraclavicular lymph nodes.  Lungs: Clear bilaterally.  Cardiac:  Regular rate and irregular rhythm with a normal S1, S2.  He has a systolic click from the tricuspid valve. Abdomen:  Obese but soft.  He has good bowel sounds.  There is no palpable abdominal mass.  No palpable hepatosplenomegaly.  Back:  No tenderness over the spine, ribs or hips.  Extremities:  Shows no clubbing, cyanosis or edema.  LABORATORY STUDIES:  Show a white cell count 7, hemoglobin 11.2, hematocrit 32.5, platelet count 291.  MCV is  81.  IMPRESSION:  Calvin [name] is a 56 year old gentleman with multiple issues for anemia.  He has a mechanical tricuspid valve.  He has renal insufficiency.  He does get iron deficient on occasion.  He also has low testosterone.  When we checked his testosterone today, it was up to 506.  As such he is on a good dose of topical replacement.  We will continue to follow him along monthly with lab work.  We will be checking his pro-time, CBC, iron studies and BUN and creatinine.  We will plan to see him back ourselves in about 3 months.    ______________________________ Volanda Napoleon, M.D. PRE/MEDQ  D:  02/02/2012  T:  02/02/2012  Job:  [id number]

## 2012-02-02 NOTE — Patient Instructions (Signed)
Call if problems 

## 2012-02-02 NOTE — Telephone Encounter (Signed)
New problem:   Would like to set up cardioversion

## 2012-02-02 NOTE — Progress Notes (Signed)
This office note has been dictated.

## 2012-02-02 NOTE — Telephone Encounter (Signed)
Spoke with pt, he wants to get scheduled for a DCCV. The [facility] thinks it will be a good idea. Follow up appt scheduled.

## 2012-02-07 ENCOUNTER — Ambulatory Visit (INDEPENDENT_AMBULATORY_CARE_PROVIDER_SITE_OTHER): Payer: BC Managed Care – PPO | Admitting: Cardiology

## 2012-02-07 ENCOUNTER — Encounter: Payer: Self-pay | Admitting: Cardiology

## 2012-02-07 ENCOUNTER — Encounter: Payer: Self-pay | Admitting: *Deleted

## 2012-02-07 ENCOUNTER — Telehealth: Payer: Self-pay | Admitting: *Deleted

## 2012-02-07 VITALS — BP 149/69 | HR 63 | Wt 299.0 lb

## 2012-02-07 DIAGNOSIS — Z954 Presence of other heart-valve replacement: Secondary | ICD-10-CM

## 2012-02-07 DIAGNOSIS — I10 Essential (primary) hypertension: Secondary | ICD-10-CM

## 2012-02-07 DIAGNOSIS — N289 Disorder of kidney and ureter, unspecified: Secondary | ICD-10-CM

## 2012-02-07 DIAGNOSIS — I712 Thoracic aortic aneurysm, without rupture, unspecified: Secondary | ICD-10-CM

## 2012-02-07 DIAGNOSIS — I2510 Atherosclerotic heart disease of native coronary artery without angina pectoris: Secondary | ICD-10-CM

## 2012-02-07 DIAGNOSIS — I4892 Unspecified atrial flutter: Secondary | ICD-10-CM

## 2012-02-07 DIAGNOSIS — I4891 Unspecified atrial fibrillation: Secondary | ICD-10-CM

## 2012-02-07 DIAGNOSIS — E785 Hyperlipidemia, unspecified: Secondary | ICD-10-CM

## 2012-02-07 DIAGNOSIS — Q251 Coarctation of aorta: Secondary | ICD-10-CM

## 2012-02-07 DIAGNOSIS — Z952 Presence of prosthetic heart valve: Secondary | ICD-10-CM

## 2012-02-07 NOTE — Assessment & Plan Note (Signed)
Continue statin. 

## 2012-02-07 NOTE — Assessment & Plan Note (Signed)
Continue SBE prophylaxis. 

## 2012-02-07 NOTE — Assessment & Plan Note (Signed)
Patient's blood pressure apparently runs low at home at times. His clonidine and beta blockade could certainly be contributing to his fatigue. However we need to control his blood pressure well given history of aneurysm. He will follow his blood pressure and we will wean medications if possible. We will most likely start with clonidine.

## 2012-02-07 NOTE — Assessment & Plan Note (Signed)
Patient remains in atrial flutter. Continue present medications for rate control and anticoagulation. He does have some fatigue. It is not clear to me that this is related to his atrial arrhythmias. Other possibilities include anemia and medications. However he would like to see if his symptoms improved with reestablishing sinus rhythm. We will document therapeutic INRs for 3 weeks and then proceed with cardioversion. If his symptoms improve with reestablishing sinus rhythm we could consider ablation. If there is no improvement then I would recommend rate control and anticoagulation.

## 2012-02-07 NOTE — Progress Notes (Signed)
HPI: Pleasant male with past medical history of paroxysmal atrial fibrillation/flutter, history of aortic valve replacement, aortic coarctation, aortic aneurysm, coronary artery disease on CT scan and renal insufficiency for fu. Patient is s/p coarctation repair in 1958 and redo in 1971. Had endovascular stent in 1995 and 1996. Had aortic balloon valvuloplasty in 1974 and AVR in 1994. 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 Stent in distal arch, proximal descending aorta patent. Mildly enlarged lymph nodes. Echo in August of 2013 in [place] showed normal LV function, severe biatrial enlargement, aortic aneurysm at 5.4 cm, peak and mean gradients through the aortic valve of 33 and 16 mm of mercury, peak gradient across previous coarctation repair 49 mm of mercury. He is seen at the [facility] clinic on a yearly basis for followup of above. Patient also has atrial flutter. Seen by Dr. Rayann Heman and rate control/anticoagulation recommended as patient minimally symptomatic. Since he was last seen there is dyspnea with more extreme activities but not routine activities. No orthopnea or PND. Minimal pedal edema. No chest pain or syncope. He does complain of fatigue.   Current Outpatient Prescriptions  Medication Sig Dispense Refill  . acetaminophen (TYLENOL) 500 MG tablet Take 500 mg by mouth every 6 (six) hours as needed.        Marland Kitchen amLODipine (NORVASC) 10 MG tablet Take 10 mg by mouth daily.       Marland Kitchen amoxicillin (AMOXIL) 500 MG capsule Before dental appts.      Marland Kitchen b complex vitamins tablet Take 1 tablet by mouth 2 (two) times daily.       . cloNIDine (CATAPRES) 0.1 MG tablet Take 0.1 mg by mouth 2 (two) times daily.       . cyclobenzaprine (FLEXERIL) 10 MG tablet Take 5 mg by mouth at bedtime as needed.        . furosemide (LASIX) 20 MG tablet 4 times weekly      . HYDROcodone-acetaminophen (VICODIN) 5-500 MG per tablet Take 1 tablet by mouth every 6 (six) hours as needed  for pain.  30 tablet  2  . labetalol (NORMODYNE) 200 MG tablet Take 200 mg by mouth 3 (three) times daily.       . metoprolol (TOPROL-XL) 100 MG 24 hr tablet Take 100 mg by mouth daily.       Marland Kitchen olmesartan (BENICAR) 40 MG tablet Take 1 tablet (40 mg total) by mouth daily.  90 tablet  3  . sildenafil (VIAGRA) 50 MG tablet Take 50 mg by mouth daily as needed.        . Testosterone (FORTESTA) 10 MG/ACT (2%) GEL Place 40 mg onto the skin daily. (2 pumps to thigh)  60 g  3  . triamcinolone cream (KENALOG) 0.1 % Apply topically as needed.      . warfarin (COUMADIN) 7.5 MG tablet daily.          Past Medical History  Diagnosis Date  . Anemia   . 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 HTN (hypertension)   . CAD (coronary artery disease)   . Aortic aneurysm   . Gout   . Atrial fibrillation   . Anemia   . CKD (chronic kidney disease)     baseline creatinine is 1.5-2  . Hyperlipidemia   . Atrial flutter   . Obesity   . Anemia of renal disease 10/04/2011    Past Surgical History  Procedure Date  . Coarctation of  aorta repair 1958  . Coarctation of aorta repair 1970  . Aortic valve replacement and mitral valve repair 1974    Bicuspid aortic valve  . Orchiectomy 1992    testicular cancer  . Aortic valve replacement 1995    [facility]  . Tonsillectomy     History   Social History  . Marital Status: Married    Spouse Name: N/A    Number of Children: 4  . Years of Education: N/A   Occupational History  . Not on file.   Social History Main Topics  . Smoking status: Former Research scientist (life sciences)  . Smokeless tobacco: Never Used  . Alcohol Use: Yes     wine, occasionally  . Drug Use: No  . Sexually Active: Not on file   Other Topics Concern  . Not on file   Social History Narrative   Pt lives in [place] with wife.  3 children ages 53,20,22 (all 3 are adopted)Sales grinding wheels.    ROS: fatigue but no fevers or chills, productive cough, hemoptysis, dysphasia,  odynophagia, melena, hematochezia, dysuria, hematuria, rash, seizure activity, orthopnea, PND, claudication. Remaining systems are negative.  Physical Exam: Well-developed well-nourished in no acute distress.  Skin is warm and dry.  HEENT is normal.  Neck is supple.  Chest is clear to auscultation with normal expansion.  Cardiovascular exam is irregular, crisp mechanical valve sounds Abdominal exam nontender or distended. No masses palpated. Extremities show trace edema. neuro grossly intact  ECG atrial flutter at a rate of 63. Normal axis. Nonspecific T-wave changes.

## 2012-02-07 NOTE — Assessment & Plan Note (Signed)
Followed at [facility].

## 2012-02-07 NOTE — Patient Instructions (Addendum)
Your physician recommends that you schedule a follow-up appointment in: [place] physician has recommended that you have a Cardioversion (DCCV). Electrical Cardioversion uses a jolt of electricity to your heart either through paddles or wired patches attached to your chest. This is a controlled, usually prescheduled, procedure. Defibrillation is done under light anesthesia in the hospital, and you usually go home the day of the procedure. This is done to get your heart back into a normal rhythm. You are not awake for the procedure. Please see the instruction sheet given to you today.   Your physician recommends that you HAVE [facility]

## 2012-02-07 NOTE — Telephone Encounter (Addendum)
Message copied by Jodelle Green on Wed Feb 07, 2012  3:14 PM ------      Message from: [name]      Created: Wed Feb 07, 2012  7:29 AM       Please call and let him know that all of his labs look great. Iron in testosterone look fine. Kidney function is stable. Thanks. Pete  Left message on pt's voicemail with the above message. Provided the pt with the BUN and creat #s per his request. To call back if questions.

## 2012-02-07 NOTE — Assessment & Plan Note (Signed)
Followed by nephrology. 

## 2012-02-07 NOTE — Assessment & Plan Note (Signed)
Followed in [place].

## 2012-02-16 ENCOUNTER — Telehealth: Payer: Self-pay | Admitting: Hematology & Oncology

## 2012-02-16 NOTE — Telephone Encounter (Signed)
Pt called left message wants to change appointments. I left him message to call.

## 2012-02-16 NOTE — Telephone Encounter (Signed)
Pt moved Oct, Nov and Dec appointments around his work schedule

## 2012-02-22 NOTE — Addendum Note (Signed)
Addended by: Levora Angel L on: 02/22/2012 02:41 PM   Modules accepted: Orders

## 2012-02-23 ENCOUNTER — Telehealth: Payer: Self-pay | Admitting: Cardiology

## 2012-02-23 NOTE — Telephone Encounter (Signed)
Spoke with pt, he is feeling fine with his bp at this level. Will cont on the same meds and call with any problems.

## 2012-02-23 NOTE — Telephone Encounter (Signed)
New problem:    Was told to take his Blood pressure taken at chiropractor  Dr. Vernell Barrier today  118/66. Pulse 83.

## 2012-02-26 ENCOUNTER — Other Ambulatory Visit (HOSPITAL_BASED_OUTPATIENT_CLINIC_OR_DEPARTMENT_OTHER): Payer: BC Managed Care – PPO | Admitting: Lab

## 2012-02-26 DIAGNOSIS — N189 Chronic kidney disease, unspecified: Secondary | ICD-10-CM

## 2012-02-26 DIAGNOSIS — I4891 Unspecified atrial fibrillation: Secondary | ICD-10-CM

## 2012-02-26 DIAGNOSIS — D649 Anemia, unspecified: Secondary | ICD-10-CM

## 2012-02-26 LAB — CBC WITH DIFFERENTIAL (CANCER CENTER ONLY)
BASO#: 0 10*3/uL (ref 0.0–0.2)
BASO%: 0.4 % (ref 0.0–2.0)
Eosinophils Absolute: 0.2 10*3/uL (ref 0.0–0.5)
HCT: 32.2 % — ABNORMAL LOW (ref 38.7–49.9)
LYMPH%: 17.5 % (ref 14.0–48.0)
MCV: 81 fL — ABNORMAL LOW (ref 82–98)
MONO#: 0.5 10*3/uL (ref 0.1–0.9)
NEUT%: 73.2 % (ref 40.0–80.0)
RDW: 14.8 % (ref 11.1–15.7)
WBC: 7 10*3/uL (ref 4.0–10.0)

## 2012-02-26 LAB — BASIC METABOLIC PANEL
CO2: 23 mEq/L (ref 19–32)
Chloride: 106 mEq/L (ref 96–112)
Glucose, Bld: 94 mg/dL (ref 70–99)
Potassium: 4.7 mEq/L (ref 3.5–5.3)
Sodium: 139 mEq/L (ref 135–145)

## 2012-02-27 ENCOUNTER — Ambulatory Visit (HOSPITAL_COMMUNITY): Payer: BC Managed Care – PPO | Admitting: Critical Care Medicine

## 2012-02-27 ENCOUNTER — Encounter (HOSPITAL_COMMUNITY): Payer: Self-pay | Admitting: Critical Care Medicine

## 2012-02-27 ENCOUNTER — Encounter (HOSPITAL_COMMUNITY): Payer: Self-pay | Admitting: *Deleted

## 2012-02-27 ENCOUNTER — Encounter (HOSPITAL_COMMUNITY)
Admission: RE | Disposition: A | Discharge status: Home / Self Care | Payer: Self-pay | Source: Ambulatory Visit | Attending: Cardiology

## 2012-02-27 ENCOUNTER — Ambulatory Visit (HOSPITAL_COMMUNITY)
Admission: RE | Admit: 2012-02-27 | Discharge: 2012-02-27 | Disposition: A | Discharge status: Home / Self Care | Payer: BC Managed Care – PPO | Source: Ambulatory Visit | Attending: Cardiology | Admitting: Cardiology

## 2012-02-27 DIAGNOSIS — I4892 Unspecified atrial flutter: Secondary | ICD-10-CM

## 2012-02-27 DIAGNOSIS — I10 Essential (primary) hypertension: Secondary | ICD-10-CM | POA: Insufficient documentation

## 2012-02-27 HISTORY — PX: CARDIOVERSION: SHX1299

## 2012-02-27 SURGERY — CARDIOVERSION
Anesthesia: General

## 2012-02-27 MED ORDER — SODIUM CHLORIDE 0.9 % IV SOLN
250.0000 mL | INTRAVENOUS | Status: DC
  Administered 2012-02-27: 500 mL via INTRAVENOUS
  Administered 2012-02-27: 13:00:00 via INTRAVENOUS

## 2012-02-27 MED ORDER — PROPOFOL 10 MG/ML IV BOLUS
INTRAVENOUS | Status: DC | PRN
  Administered 2012-02-27 (×2): 50 mg via INTRAVENOUS
  Administered 2012-02-27: 100 mg via INTRAVENOUS

## 2012-02-27 MED ORDER — HYDROCORTISONE 1 % EX CREA: 1.0000 "application " | TOPICAL_CREAM | Freq: Three times a day (TID) | CUTANEOUS | Status: DC | PRN

## 2012-02-27 MED ORDER — SODIUM CHLORIDE 0.9 % IJ SOLN: 3.0000 mL | INTRAMUSCULAR | Status: DC | PRN

## 2012-02-27 MED ORDER — SODIUM CHLORIDE 0.9 % IJ SOLN: 3.0000 mL | Freq: Two times a day (BID) | INTRAMUSCULAR | Status: DC

## 2012-02-27 NOTE — Transfer of Care (Addendum)
Immediate Anesthesia Transfer of Care Note  Patient: Calvin [name]  Procedure(s) Performed: Procedure(s) (LRB) with comments: CARDIOVERSION (N/A)  Patient Location: Endoscopy Unit  Anesthesia Type:General  Level of Consciousness: awake, alert  and oriented  Airway & Oxygen Therapy: Patient Spontanous Breathing  Post-op Assessment: Report given to PACU RN, Post -op Vital signs reviewed and stable and Patient moving all extremities X 4  Post vital signs: Reviewed and stable  Complications: No apparent anesthesia complications

## 2012-02-27 NOTE — Anesthesia Postprocedure Evaluation (Signed)
  Anesthesia Post-op Note  Patient: Calvin [name]  Procedure(s) Performed: Procedure(s) (LRB) with comments: CARDIOVERSION (N/A)  Patient Location: Endoscopy Unit  Anesthesia Type:General  Level of Consciousness: awake, alert  and oriented  Airway and Oxygen Therapy: Patient Spontanous Breathing  Post-op Pain: none  Post-op Assessment: Post-op Vital signs reviewed, Patient's Cardiovascular Status Stable, Respiratory Function Stable, Patent Airway and No signs of Nausea or vomiting  Post-op Vital Signs: Reviewed and stable  Complications: No apparent anesthesia complications

## 2012-02-27 NOTE — H&P (Signed)
Calvin [name]   02/07/2012 3:15 PM Office Visit  MRN: [id number]   Description: 56 year old male  Provider: Kirk Ruths, MD  Department: [place]        Referring Provider     Burnice Logan, MD      Diagnoses     Atrial fibrillation   - Primary    427.31    Atrial flutter     427.32    Thoracic aortic aneurysm     441.2    S/P aortic valve replacement     V43.3    Renal insufficiency     593.9    Hypertension     401.9    Hyperlipidemia     272.4    Coarctation of aorta     747.10    CAD (coronary artery disease)     414.00      Reason for Visit     Follow-up    AVR         Progress Notes     Kirk Ruths, MD  02/07/2012  4:42 PM  Signed    HPI: Pleasant male with past medical history of paroxysmal atrial fibrillation/flutter, history of aortic valve replacement, aortic coarctation, aortic aneurysm, coronary artery disease on CT scan and renal insufficiency for fu. Patient is s/p coarctation repair in 1958 and redo in 1971. Had endovascular stent in 1995 and 1996. Had aortic balloon valvuloplasty in 1974 and AVR in 1994. 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 Stent in distal arch, proximal descending aorta patent. Mildly enlarged lymph nodes. Echo in August of 2013 in [place] showed normal LV function, severe biatrial enlargement, aortic aneurysm at 5.4 cm, peak and mean gradients through the aortic valve of 33 and 16 mm of mercury, peak gradient across previous coarctation repair 49 mm of mercury. He is seen at the [facility] clinic on a yearly basis for followup of above. Patient also has atrial flutter. Seen by Dr. Rayann Heman and rate control/anticoagulation recommended as patient minimally symptomatic. Since he was last seen there is dyspnea with more extreme activities but not routine activities. No orthopnea or PND. Minimal pedal edema. No chest pain or syncope. He does complain of fatigue.      Current  Outpatient Prescriptions   Medication  Sig  Dispense  Refill   .  acetaminophen (TYLENOL) 500 MG tablet  Take 500 mg by mouth every 6 (six) hours as needed.           Marland Kitchen  amLODipine (NORVASC) 10 MG tablet  Take 10 mg by mouth daily.          Marland Kitchen  amoxicillin (AMOXIL) 500 MG capsule  Before dental appts.         Marland Kitchen  b complex vitamins tablet  Take 1 tablet by mouth 2 (two) times daily.          .  cloNIDine (CATAPRES) 0.1 MG tablet  Take 0.1 mg by mouth 2 (two) times daily.          .  cyclobenzaprine (FLEXERIL) 10 MG tablet  Take 5 mg by mouth at bedtime as needed.           .  furosemide (LASIX) 20 MG tablet  4 times weekly         .  HYDROcodone-acetaminophen (VICODIN) 5-500 MG per tablet  Take 1 tablet by mouth every 6 (six) hours as needed for pain.   [place]  tablet   2   .  labetalol (NORMODYNE) 200 MG tablet  Take 200 mg by mouth 3 (three) times daily.          .  metoprolol (TOPROL-XL) 100 MG 24 hr tablet  Take 100 mg by mouth daily.          Marland Kitchen  olmesartan (BENICAR) 40 MG tablet  Take 1 tablet (40 mg total) by mouth daily.   90 tablet   3   .  sildenafil (VIAGRA) 50 MG tablet  Take 50 mg by mouth daily as needed.           .  Testosterone (FORTESTA) 10 MG/ACT (2%) GEL  Place 40 mg onto the skin daily. (2 pumps to thigh)   60 g   3   .  triamcinolone cream (KENALOG) 0.1 %  Apply topically as needed.         .  warfarin (COUMADIN) 7.5 MG tablet  daily.                Past Medical History   Diagnosis  Date   .  Anemia     .  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  HTN (hypertension)     .  CAD (coronary artery disease)     .  Aortic aneurysm     .  Gout     .  Atrial fibrillation     .  Anemia     .  CKD (chronic kidney disease)         baseline creatinine is 1.5-2   .  Hyperlipidemia     .  Atrial flutter     .  Obesity     .  Anemia of renal disease  10/04/2011       Past Surgical History   Procedure  Date   .  Coarctation of aorta repair  1958   .  Coarctation of  aorta repair  1970   .  Aortic valve replacement and mitral valve repair  1974       Bicuspid aortic valve   .  Orchiectomy  1992       testicular cancer   .  Aortic valve replacement  1995       [facility]   .  Tonsillectomy         History       Social History   .  Marital Status:  Married       Spouse Name:  N/A       Number of Children:  4   .  Years of Education:  N/A       Occupational History   .  Not on file.       Social History Main Topics   .  Smoking status:  Former Research scientist (life sciences)   .  Smokeless tobacco:  Never Used   .  Alcohol Use:  Yes         wine, occasionally   .  Drug Use:  No   .  Sexually Active:  Not on file       Other Topics  Concern   .  Not on file       Social History Narrative     Pt lives in [place] with wife.  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      ROS: fatigue but  no fevers or chills, productive cough, hemoptysis, dysphasia, odynophagia, melena, hematochezia, dysuria, hematuria, rash, seizure activity, orthopnea, PND, claudication. Remaining systems are negative.   Physical Exam: Well-developed well-nourished in no acute distress.   Skin is warm and dry.   HEENT is normal.   Neck is supple.   Chest is clear to auscultation with normal expansion.   Cardiovascular exam is irregular, crisp mechanical valve sounds Abdominal exam nontender or distended. No masses palpated. Extremities show trace edema. neuro grossly intact   ECG atrial flutter at a rate of 63. Normal axis. Nonspecific T-wave changes.              Atrial flutter - Kirk Ruths, MD  02/07/2012  4:04 PM  Signed Patient remains in atrial flutter. Continue present medications for rate control and anticoagulation. He does have some fatigue. It is not clear to me that this is related to his atrial arrhythmias. Other possibilities include anemia and medications. However he would like to see if his symptoms improved with  reestablishing sinus rhythm. We will document therapeutic INRs for 3 weeks and then proceed with cardioversion. If his symptoms improve with reestablishing sinus rhythm we could consider ablation. If there is no improvement then I would recommend rate control and anticoagulation.  Thoracic aortic aneurysm - Kirk Ruths, MD  02/07/2012  4:04 PM  Signed Followed in [place].  S/P aortic valve replacement - Kirk Ruths, MD  02/07/2012  4:05 PM  Signed Continue SBE prophylaxis.  Renal insufficiency - Kirk Ruths, MD  02/07/2012  4:05 PM  Signed Followed by nephrology.  Hypertension - Kirk Ruths, MD  02/07/2012  4:06 PM  Signed Patient's blood pressure apparently runs low at home at times. His clonidine and beta blockade could certainly be contributing to his fatigue. However we need to control his blood pressure well given history of aneurysm. He will follow his blood pressure and we will wean medications if possible. We will most likely start with clonidine.  Hyperlipidemia - Kirk Ruths, MD  02/07/2012  4:06 PM  Signed Continue statin.  Coarctation of aorta - Kirk Ruths, MD  02/07/2012  4:06 PM  Signed Followed at [facility].  CAD (coronary artery disease) - Kirk Ruths, MD  02/07/2012  4:07 PM  Signed Continue statin.

## 2012-02-27 NOTE — Anesthesia Preprocedure Evaluation (Addendum)
Anesthesia Evaluation  Patient identified by MRN, date of birth, ID band Patient awake    Reviewed: Allergy & Precautions, H&P , NPO status , Patient's Chart, lab work & pertinent test results  Airway Mallampati: I TM Distance: >3 FB Neck ROM: Full    Dental  (+) Dental Advisory Given   Pulmonary          Cardiovascular hypertension, Pt. on medications + CAD + dysrhythmias Atrial Fibrillation  Echo 4/13 EF 55-60%, mild LVH S/p aortic valve repair, coarctation of aorta   Neuro/Psych    GI/Hepatic   Endo/Other    Renal/GU Renal InsufficiencyRenal disease     Musculoskeletal   Abdominal   Peds  Hematology   Anesthesia Other Findings   Reproductive/Obstetrics                         Anesthesia Physical Anesthesia Plan  ASA: III  Anesthesia Plan: General   Post-op Pain Management:    Induction: Intravenous  Airway Management Planned: Mask  Additional Equipment:   Intra-op Plan:   Post-operative Plan: Extubation in OR  Informed Consent: I have reviewed the patients History and Physical, chart, labs and discussed the procedure including the risks, benefits and alternatives for the proposed anesthesia with the patient or authorized representative who has indicated his/her understanding and acceptance.   Dental advisory given  Plan Discussed with: Surgeon and CRNA  Anesthesia Plan Comments:        Anesthesia Quick Evaluation

## 2012-02-27 NOTE — Procedures (Signed)
Electrical Cardioversion Procedure Note Sanat Wadlington [id number] May 02, 1955  Procedure: Electrical Cardioversion Indications:  Atrial Flutter  Procedure Details Consent: Risks of procedure as well as the alternatives and risks of each were explained to the (patient/caregiver).  Consent for procedure obtained. Time Out: Verified patient identification, verified procedure, site/side was marked, verified correct patient position, special equipment/implants available, medications/allergies/relevent history reviewed, required imaging and test results available.  Performed  Patient placed on cardiac monitor, pulse oximetry, supplemental oxygen as necessary.  Sedation given: Patient sedated by anesthesia with diprovan 200 mg IV. Pacer pads placed anterior and posterior chest.  Cardioverted 1 time(s).  Cardioverted at 120J.  Evaluation Findings: Post procedure EKG shows: NSR Complications: None Patient did tolerate procedure well.   Kirk Ruths 02/27/2012, 1:15 PM

## 2012-02-27 NOTE — Preoperative (Signed)
Beta Blockers   Reason not to administer Beta Blockers:Not Applicable 

## 2012-02-28 ENCOUNTER — Encounter (HOSPITAL_COMMUNITY): Payer: Self-pay | Admitting: Cardiology

## 2012-02-28 ENCOUNTER — Telehealth: Payer: Self-pay | Admitting: *Deleted

## 2012-02-28 NOTE — Telephone Encounter (Signed)
Message copied by Rico Ala on Wed Feb 28, 2012 10:01 AM ------      Message from: Volanda Napoleon      Created: Tue Feb 27, 2012  7:31 AM       Please call him with his lab results!!  Everything is stable. Coumadin dose is perfect!!  Laurey Arrow

## 2012-02-28 NOTE — Telephone Encounter (Signed)
Called patient on personal cell phone to let him know that his labwork was good and stable. Stay on same Coumadin dose per dr. Marin Olp

## 2012-03-01 ENCOUNTER — Other Ambulatory Visit: Payer: BC Managed Care – PPO | Admitting: Lab

## 2012-03-04 ENCOUNTER — Encounter: Payer: Self-pay | Admitting: Sports Medicine

## 2012-03-04 ENCOUNTER — Ambulatory Visit (INDEPENDENT_AMBULATORY_CARE_PROVIDER_SITE_OTHER): Payer: BC Managed Care – PPO | Admitting: Sports Medicine

## 2012-03-04 VITALS — BP 160/67 | HR 65 | Ht 74.0 in | Wt 295.0 lb

## 2012-03-04 DIAGNOSIS — M25569 Pain in unspecified knee: Secondary | ICD-10-CM

## 2012-03-04 MED ORDER — CYCLOBENZAPRINE HCL 5 MG PO TABS: 5.0000 mg | ORAL_TABLET | Freq: Every evening | ORAL | Status: DC | PRN

## 2012-03-04 NOTE — Progress Notes (Signed)
Patient ID: Calvin [name], male   DOB: Nov 17, 1955, 56 y.o.   MRN: [id number]  SUBJECTIVE: Calvin [name] is a 56 y.o. male with complex past medical history, including multiple heart surgeries and recent cardioversion out of atrial flutter into sinus rhythm, presenting today for chief complaint of left knee swelling, pain and decreased ROM for the last 5-6 days.  Pt with known history of OA in bilateral knees and medial meniscal tear of LEFT knee from degeneration (improved with home exercises and body helix from last visit).   He was also seen by a rheumatologists for elevated CRP, weakly positive RF and had extensive inflammatory, degenerative arthritis panel drawn but has not undergone follow-up appointment yet.  Pt reports insidious onset to knee pain, swelling and decreased ROM.  Denies trauma or distinct injury.  Reports primary limitations are with weight-bearing knee extension - such as climbing stairs or getting out of his car, and primary limitations in ROM with knee flexion >extension.  Specifically notes difficulty with stairs, putting on shoes in the morning as well.  He has been icing/heating with some pain relief but limited difference in swelling.  Uses 1/2 tab Vicodin and 1/2 tab flexeril prn nightly to sleep and sleeps through the night. -- Pt also needs refill on flexeril  OBJECTIVE: Vital signs as noted above. Appearance: Pleasant male patient, slightly obese but in no acute distress  Knee exam: - Pt with mildly edematous RIGHT knee as compared to left. No erythema, echhymosis.  Cool to touch. - AROM symmetric, appropriate in extension.  Active flexion limited on RIGHT to point that patient is unable to make his heel touch the exam table, just inches behind it.   - PROM appears preserved in knee flexion, extension though some pain with flexion. - 5/5 strength in bilateral knee extension.  4/5 knee flexion on RIGHT knee, 5/5 in left. - -On prone exam - 4/5 knee flexion on RIGHT, 5/5 on  left.  Right popliteal fossa's contours are obscured by edema and appears larger as compared to left - On palpation, there is a baker's cysts in posterior aspect of RIGHT knee but cool to touch - Negative for joint line tenderness bilaterally - Negative dial test with knee's flexed to 90 degrees - Negative Lachman's, anterior/posterior drawer - Negative Valgus/Varus stress testing  Procedure --> Knee Injection: - Knee injection performed by Dr. Micheline Chapman with patient in seated position, knee non-weightbearing and flexed to 90 degrees - Anterior-lateral approach used following patient consent for procedure - Local anaesthesia achieved using ethyl chloride spray - 2 cc depomedrol, 6 cc lidocaine injection performed without complication in RIGHT knee from anterior, medial joint line - Pt tolerated procedure well and hemostasis achieved using pressure with gauze <3 seconds and one bandage  No new imaging obtained since last visit to sports medicine center  ASSESSMENT: 1. 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 Right knee meniscal tear per history, currently asymptomatic  PLAN: Pt with above history and physical exam suggestive of osteoarthritis flair of RIGHT knee with Baker's cyst formation and mild intraarticular unilateral edema.  Pt treated with intra-articular injection of RIGHT knee with mixture of long-acting steroid and anaesthetic as he had previously been counseled about during previous encounters with [facility] staff.   Pt will follow-up as needed for repeat evaluation and treatment to include possible repeat injection.  Given his extensive medical history and artificial valves in heart the patient is not a surgical candidate and will not pursue further testing  of knee joint (additiionally he endorses claustrophobia making MRI difficult).  -- Calvin Carbon, MS IV

## 2012-03-25 ENCOUNTER — Other Ambulatory Visit: Payer: Self-pay | Admitting: Hematology & Oncology

## 2012-03-29 ENCOUNTER — Other Ambulatory Visit (HOSPITAL_BASED_OUTPATIENT_CLINIC_OR_DEPARTMENT_OTHER): Payer: BC Managed Care – PPO

## 2012-03-29 ENCOUNTER — Ambulatory Visit: Payer: BC Managed Care – PPO | Admitting: Lab

## 2012-03-29 DIAGNOSIS — D649 Anemia, unspecified: Secondary | ICD-10-CM

## 2012-03-29 DIAGNOSIS — N189 Chronic kidney disease, unspecified: Secondary | ICD-10-CM

## 2012-03-29 DIAGNOSIS — I4891 Unspecified atrial fibrillation: Secondary | ICD-10-CM

## 2012-03-29 LAB — CBC WITH DIFFERENTIAL (CANCER CENTER ONLY)
BASO%: 0.3 % (ref 0.0–2.0)
EOS%: 2.8 % (ref 0.0–7.0)
LYMPH#: 1 10*3/uL (ref 0.9–3.3)
LYMPH%: 12.6 % — ABNORMAL LOW (ref 14.0–48.0)
MCHC: 34.1 g/dL (ref 32.0–35.9)
MCV: 83 fL (ref 82–98)
MONO#: 0.6 10*3/uL (ref 0.1–0.9)
Platelets: 266 10*3/uL (ref 145–400)
RDW: 15.1 % (ref 11.1–15.7)
WBC: 7.5 10*3/uL (ref 4.0–10.0)

## 2012-03-29 LAB — BASIC METABOLIC PANEL
Calcium: 9.1 mg/dL (ref 8.4–10.5)
Creatinine, Ser: 1.67 mg/dL — ABNORMAL HIGH (ref 0.50–1.35)
Glucose, Bld: 118 mg/dL — ABNORMAL HIGH (ref 70–99)
Sodium: 138 mEq/L (ref 135–145)

## 2012-03-29 LAB — PROTIME-INR (CHCC SATELLITE): Protime: 31.2 Seconds — ABNORMAL HIGH (ref 10.6–13.4)

## 2012-04-05 ENCOUNTER — Other Ambulatory Visit: Payer: BC Managed Care – PPO | Admitting: Lab

## 2012-04-10 ENCOUNTER — Encounter: Payer: Self-pay | Admitting: Cardiology

## 2012-04-10 ENCOUNTER — Ambulatory Visit (INDEPENDENT_AMBULATORY_CARE_PROVIDER_SITE_OTHER): Payer: BC Managed Care – PPO | Admitting: Cardiology

## 2012-04-10 VITALS — BP 150/60 | HR 71 | Ht 74.0 in | Wt 304.0 lb

## 2012-04-10 DIAGNOSIS — I4891 Unspecified atrial fibrillation: Secondary | ICD-10-CM

## 2012-04-10 DIAGNOSIS — I4892 Unspecified atrial flutter: Secondary | ICD-10-CM

## 2012-04-10 DIAGNOSIS — I712 Thoracic aortic aneurysm, without rupture: Secondary | ICD-10-CM

## 2012-04-10 DIAGNOSIS — I10 Essential (primary) hypertension: Secondary | ICD-10-CM

## 2012-04-10 DIAGNOSIS — N289 Disorder of kidney and ureter, unspecified: Secondary | ICD-10-CM

## 2012-04-10 DIAGNOSIS — Z952 Presence of prosthetic heart valve: Secondary | ICD-10-CM

## 2012-04-10 DIAGNOSIS — I2510 Atherosclerotic heart disease of native coronary artery without angina pectoris: Secondary | ICD-10-CM

## 2012-04-10 DIAGNOSIS — E785 Hyperlipidemia, unspecified: Secondary | ICD-10-CM

## 2012-04-10 DIAGNOSIS — IMO0001 Reserved for inherently not codable concepts without codable children: Secondary | ICD-10-CM

## 2012-04-10 DIAGNOSIS — Q251 Coarctation of aorta: Secondary | ICD-10-CM

## 2012-04-10 DIAGNOSIS — Z954 Presence of other heart-valve replacement: Secondary | ICD-10-CM

## 2012-04-10 NOTE — Assessment & Plan Note (Signed)
Continue SBE prophylaxis. 

## 2012-04-10 NOTE — Assessment & Plan Note (Signed)
Management per primary care. 

## 2012-04-10 NOTE — Assessment & Plan Note (Signed)
Followed by nephrology. 

## 2012-04-10 NOTE — Assessment & Plan Note (Signed)
Blood pressure is mildly elevated but he states typically controlled at home. Continue present medications.

## 2012-04-10 NOTE — Progress Notes (Signed)
PH:[id number] male with past medical history of paroxysmal atrial fibrillation/flutter, history of aortic valve replacement, aortic coarctation, aortic aneurysm, coronary artery disease on CT scan and renal insufficiency for fu. Patient is s/p coarctation repair in 1958 and redo in 1971. Had endovascular stent in 1995 and 1996. Had aortic balloon valvuloplasty in 1974 and AVR in 1994. 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 Stent in distal arch, proximal descending aorta patent. Mildly enlarged lymph nodes. Echo in August of 2013 in [place] showed normal LV function, severe biatrial enlargement, aortic aneurysm at 5.4 cm, peak and mean gradients through the aortic valve of 33 and 16 mm of mercury, peak gradient across previous coarctation repair 49 mm of mercury. He is seen at the [facility] clinic on a yearly basis for followup of above. Patient also has atrial flutter. Seen by Dr. Rayann Heman and rate control/anticoagulation recommended as patient minimally symptomatic. However patient continued to complain of mild dyspnea on exertion and fatigue. We proceeded with cardioversion on 02/27/2012 to see if his symptoms improved. He converted to sinus rhythm. Since that time, his fatigue and dyspnea on exertion are improved. No chest pain, palpitations, syncope or bleeding. Mild pedal edema.    Current Outpatient Prescriptions  Medication Sig Dispense Refill  . acetaminophen (TYLENOL) 500 MG tablet Take 500 mg by mouth every 6 (six) hours as needed.        Marland Kitchen amLODipine (NORVASC) 10 MG tablet Take 10 mg by mouth daily.       Marland Kitchen amoxicillin (AMOXIL) 500 MG capsule Before dental appts.      Marland Kitchen b complex vitamins tablet Take 1 tablet by mouth 2 (two) times daily.       . cloNIDine (CATAPRES) 0.1 MG tablet Take 0.1 mg by mouth 2 (two) times daily.       . cyclobenzaprine (FLEXERIL) 10 MG tablet Take 5 mg by mouth at bedtime as needed.        Marland Kitchen FORTESTA 10 MG/ACT (2%) GEL APPLY 2  PUMPS TO THIGH ONCE DAILY  60 g  3  . furosemide (LASIX) 20 MG tablet 4 times weekly      . HYDROcodone-acetaminophen (VICODIN) 5-500 MG per tablet Take 1 tablet by mouth every 6 (six) hours as needed for pain.  30 tablet  2  . labetalol (NORMODYNE) 200 MG tablet Take 200 mg by mouth 3 (three) times daily.       . metoprolol (TOPROL-XL) 100 MG 24 hr tablet Take 100 mg by mouth daily.       Marland Kitchen olmesartan (BENICAR) 40 MG tablet Take 1 tablet (40 mg total) by mouth daily.  90 tablet  3  . sildenafil (VIAGRA) 50 MG tablet Take 50 mg by mouth daily as needed.        . triamcinolone cream (KENALOG) 0.1 % Apply topically as needed.      . warfarin (COUMADIN) 7.5 MG tablet daily.          Past Medical History  Diagnosis Date  . Anemia   . 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 HTN (hypertension)   . CAD (coronary artery disease)   . Aortic aneurysm   . Gout   . Atrial fibrillation   . Anemia   . Hyperlipidemia   . Atrial flutter   . Obesity   . Anemia of renal disease 10/04/2011  . CKD (chronic kidney disease)     baseline creatinine is 1.5-2  Past Surgical History  Procedure Date  . Coarctation of aorta repair 1958  . Coarctation of aorta repair 1970  . Aortic valve replacement and mitral valve repair 1974    Bicuspid aortic valve  . Orchiectomy 1992    testicular cancer  . Aortic valve replacement 1995    [facility]  . Tonsillectomy   . Cardioversion 02/27/2012    Procedure: CARDIOVERSION;  Surgeon: Lelon Perla, MD;  Location: [facility] ENDOSCOPY;  Service: Cardiovascular;  Laterality: N/A;    History   Social History  . Marital Status: Married    Spouse Name: N/A    Number of Children: 4  . Years of Education: N/A   Occupational History  . Not on file.   Social History Main Topics  . Smoking status: Former Research scientist (life sciences)  . Smokeless tobacco: Never Used  . Alcohol Use: Yes     Comment: wine, occasionally  . Drug Use: No  . Sexually Active: Not on file    Other Topics Concern  . Not on file   Social History Narrative   Pt lives in [place] with wife.  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    ROS: no fevers or chills, productive cough, hemoptysis, dysphasia, odynophagia, melena, hematochezia, dysuria, hematuria, rash, seizure activity, orthopnea, PND,  claudication. Remaining systems are negative.  Physical Exam: Well-developed well-nourished in no acute distress.  Skin is warm and dry.  HEENT is normal.  Neck is supple.  Chest is clear to auscultation with normal expansion.  Cardiovascular exam is regular rate and rhythm. Crisp mechanical valve sound. 2/6 systolic murmur. Abdominal exam nontender or distended. No masses palpated. Extremities show 1+ edema. neuro grossly intact  ECG sinus rhythm with first degree AV block. Normal axis. Nonspecific T-wave changes.

## 2012-04-10 NOTE — Assessment & Plan Note (Signed)
Patient remains in sinus rhythm status post cardioversion. Continue beta blocker and Coumadin. His symptoms are improved. Therefore if he develops recurrent atrial flutter in the future would consider ablation for symptomatic improvement.

## 2012-04-10 NOTE — Patient Instructions (Addendum)
Your physician recommends that you schedule a follow-up appointment in: April 2014  Your physician has requested that you have an echocardiogram. Echocardiography is a painless test that uses sound waves to create images of your heart. It provides your doctor with information about the size and shape of your heart and how well your heart's chambers and valves are working. This procedure takes approximately one hour. There are no restrictions for this procedure.

## 2012-04-10 NOTE — Assessment & Plan Note (Signed)
Repeat echocardiogram. If aortic root increases further in size he may need to repeat a CTA. He is followed at the [facility] clinic for this issue and we'll most likely had a repeat CTA in July of 2014 by his report.

## 2012-04-10 NOTE — Assessment & Plan Note (Signed)
Followed at the [facility] clinic.

## 2012-04-14 ENCOUNTER — Other Ambulatory Visit: Payer: Self-pay | Admitting: Internal Medicine

## 2012-04-15 NOTE — Telephone Encounter (Signed)
Rash - Gay Filler, MD 07/06/2011 5:54 PM Signed  Appears as dermatitis without unknown trigger. Stop otc cream. Attempt triamcinolone. Consider dermatology if persists  Please advise on Triamcinolone refill request [Last OV 07.25.13]/SLS

## 2012-04-26 ENCOUNTER — Telehealth: Payer: Self-pay | Admitting: Cardiology

## 2012-04-26 ENCOUNTER — Ambulatory Visit (HOSPITAL_COMMUNITY): Payer: BC Managed Care – PPO | Attending: Cardiology | Admitting: Radiology

## 2012-04-26 DIAGNOSIS — I10 Essential (primary) hypertension: Secondary | ICD-10-CM | POA: Insufficient documentation

## 2012-04-26 DIAGNOSIS — I359 Nonrheumatic aortic valve disorder, unspecified: Secondary | ICD-10-CM

## 2012-04-26 DIAGNOSIS — I4891 Unspecified atrial fibrillation: Secondary | ICD-10-CM | POA: Insufficient documentation

## 2012-04-26 DIAGNOSIS — Q251 Coarctation of aorta: Secondary | ICD-10-CM

## 2012-04-26 DIAGNOSIS — I712 Thoracic aortic aneurysm, without rupture, unspecified: Secondary | ICD-10-CM | POA: Insufficient documentation

## 2012-04-26 DIAGNOSIS — IMO0001 Reserved for inherently not codable concepts without codable children: Secondary | ICD-10-CM

## 2012-04-26 NOTE — Telephone Encounter (Signed)
New Problem:     Called in requesting instructions for a [place] for the patient's upcomming procedure.  Please call back.

## 2012-04-26 NOTE — Addendum Note (Signed)
Addended by: Marlis Edelson C on: 04/26/2012 05:35 PM   Modules accepted: Orders

## 2012-04-26 NOTE — Telephone Encounter (Signed)
**Note De-Identified [name] Obfuscation** Amanda at [facility] is advised that our Coumadin clinic does not manage pt's Coumadin level. She states she will give [name], PA the message. Estill Bamberg given our phone number to call with any questions.

## 2012-04-26 NOTE — Progress Notes (Signed)
Echocardiogram performed.  

## 2012-04-29 ENCOUNTER — Other Ambulatory Visit (HOSPITAL_BASED_OUTPATIENT_CLINIC_OR_DEPARTMENT_OTHER): Payer: BC Managed Care – PPO | Admitting: Lab

## 2012-04-29 ENCOUNTER — Ambulatory Visit (HOSPITAL_BASED_OUTPATIENT_CLINIC_OR_DEPARTMENT_OTHER): Payer: BC Managed Care – PPO | Admitting: Hematology & Oncology

## 2012-04-29 VITALS — BP 117/43 | HR 67 | Temp 97.5°F | Resp 20 | Ht 74.0 in | Wt 300.0 lb

## 2012-04-29 DIAGNOSIS — E291 Testicular hypofunction: Secondary | ICD-10-CM

## 2012-04-29 DIAGNOSIS — D509 Iron deficiency anemia, unspecified: Secondary | ICD-10-CM

## 2012-04-29 DIAGNOSIS — N189 Chronic kidney disease, unspecified: Secondary | ICD-10-CM

## 2012-04-29 DIAGNOSIS — D649 Anemia, unspecified: Secondary | ICD-10-CM

## 2012-04-29 DIAGNOSIS — C61 Malignant neoplasm of prostate: Secondary | ICD-10-CM

## 2012-04-29 DIAGNOSIS — C6290 Malignant neoplasm of unspecified testis, unspecified whether descended or undescended: Secondary | ICD-10-CM

## 2012-04-29 DIAGNOSIS — Z952 Presence of prosthetic heart valve: Secondary | ICD-10-CM

## 2012-04-29 DIAGNOSIS — N289 Disorder of kidney and ureter, unspecified: Secondary | ICD-10-CM

## 2012-04-29 DIAGNOSIS — I4891 Unspecified atrial fibrillation: Secondary | ICD-10-CM

## 2012-04-29 LAB — PROTIME-INR (CHCC SATELLITE)
INR: 2.8 (ref 2.0–3.5)
Protime: 33.6 Seconds — ABNORMAL HIGH (ref 10.6–13.4)

## 2012-04-29 LAB — BASIC METABOLIC PANEL
BUN: 44 mg/dL — ABNORMAL HIGH (ref 6–23)
CO2: 23 mEq/L (ref 19–32)
Calcium: 8.9 mg/dL (ref 8.4–10.5)
Chloride: 106 mEq/L (ref 96–112)
Creatinine, Ser: 1.98 mg/dL — ABNORMAL HIGH (ref 0.50–1.35)
Glucose, Bld: 102 mg/dL — ABNORMAL HIGH (ref 70–99)
Potassium: 4.9 mEq/L (ref 3.5–5.3)
Sodium: 140 mEq/L (ref 135–145)

## 2012-04-29 LAB — CBC WITH DIFFERENTIAL (CANCER CENTER ONLY)
BASO%: 0.3 % (ref 0.0–2.0)
EOS%: 1.8 % (ref 0.0–7.0)
HCT: 32.8 % — ABNORMAL LOW (ref 38.7–49.9)
LYMPH#: 1.1 10*3/uL (ref 0.9–3.3)
MCHC: 33.5 g/dL (ref 32.0–35.9)
MONO#: 0.6 10*3/uL (ref 0.1–0.9)
NEUT#: 4.9 10*3/uL (ref 1.5–6.5)
NEUT%: 73.2 % (ref 40.0–80.0)
Platelets: 299 10*3/uL (ref 145–400)
RDW: 14.9 % (ref 11.1–15.7)
WBC: 6.7 10*3/uL (ref 4.0–10.0)

## 2012-04-29 LAB — FERRITIN: Ferritin: 643 ng/mL — ABNORMAL HIGH (ref 22–322)

## 2012-04-29 LAB — IRON AND TIBC
Iron: 57 ug/dL (ref 42–165)
TIBC: 270 ug/dL (ref 215–435)
UIBC: 213 ug/dL (ref 125–400)

## 2012-04-29 MED ORDER — FONDAPARINUX SODIUM 10 MG/0.8ML ~~LOC~~ SOLN: 10.0000 mg | SUBCUTANEOUS | Status: DC

## 2012-04-29 NOTE — Progress Notes (Signed)
This office note has been dictated.

## 2012-05-01 NOTE — Progress Notes (Signed)
CC:   [name], MD  DIAGNOSES: 1. Anemia secondary to renal insufficiency. 2. Hypotestosteronemia. 3. 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  CURRENT THERAPY: 1. Coumadin, lifelong. 2. IV iron as needed. 3. Aranesp 200 mcg subcu as needed for hemoglobin less than 11. 4. Topical testosterone replacement therapy.  INTERIM HISTORY:  Mr. Calvin [name] comes in for followup.  We see him every 2 or 3 months now.  He is doing fairly well.  He did get cardioverted from his atrial flutter.  He is still on Coumadin.  He is doing well with the Coumadin.  With conversion, he said he has felt better.  He is still traveling with his work.  He is trying to cut back on this a little bit.  He had a nice Thanksgiving and Christmas.  He, again, I think, was on the road a little bit.  He is doing well with the testosterone gel.  He feels this has helped a little bit.  He has had no fever.  He has had no bleeding.  He has had no leg swelling.  He has really not required much in the way of Aranesp.  I think his last Aranesp was done back in September.  His last iron was done back in July.  He tells me that he is going to have a colonoscopy.  He is on Coumadin. We had to convert him over to Arixtra before and after the procedure.  I did write a prescription for 10 mg of Arixtra to be taken as directed.  He has also had problems with uric acid.  This has caused some arthralgias.  He is on Uloric now.  PHYSICAL EXAMINATION:  This is a somewhat obese white gentleman in no obvious distress.  Vital signs:  Temperature of 97.5, pulse 67, respiratory rate 20, blood pressure 117/43.  Weight is 300 pounds.  Head and neck:  Normocephalic, atraumatic skull.  There are no ocular or oral lesions.  There are no palpable cervical or supraclavicular lymph nodes. Lungs:  Clear bilaterally.  Cardiac:  Regular rate and rhythm with a normal S1 and S2.  He has a systolic click  from his prostatic valve. Abdomen:  Soft with good bowel sounds.  He is mildly obese.  He has no fluid wave.  There is no palpable hepatosplenomegaly.  Back:  No tenderness over the spine, ribs, or hips.  Extremities:  No clubbing, cyanosis or edema.  LABORATORY STUDIES:  White cell count of 6.7, hemoglobin 11, hematocrit 33, platelet count 299.  Ferritin 643.  IMPRESSION:  Mr. Calvin [name] is a 57 year old gentleman with multiple issues. He is anemic.  This is multifactorial with renal insufficiency, intermittent iron deficiency and low testosterone.  Again, he has done quite well.  He does not need any Aranesp today.  I forgot to mention that his prothrombin time was 2.8 seconds.  This, from my point of view, is perfect.  I do not see any problem with him being "bridged" for his colonoscopy.  I will plan to continue to follow him every I think 3 weeks or so with lab work.  I will plan see him back myself in 3 months.    ______________________________ Volanda Napoleon, M.D. PRE/MEDQ  D:  04/30/2012  T:  05/01/2012  Job:  [id number]

## 2012-05-03 ENCOUNTER — Other Ambulatory Visit: Payer: BC Managed Care – PPO | Admitting: Lab

## 2012-05-03 ENCOUNTER — Ambulatory Visit: Payer: BC Managed Care – PPO | Admitting: Hematology & Oncology

## 2012-05-06 ENCOUNTER — Telehealth: Payer: Self-pay | Admitting: *Deleted

## 2012-05-06 ENCOUNTER — Other Ambulatory Visit: Payer: Self-pay | Admitting: *Deleted

## 2012-05-06 DIAGNOSIS — C61 Malignant neoplasm of prostate: Secondary | ICD-10-CM

## 2012-05-06 NOTE — Telephone Encounter (Signed)
Message copied by Rico Ala on Mon May 06, 2012 10:14 AM ------      Message from: Volanda Napoleon      Created: Sat May 04, 2012 12:09 PM       Call - iron is ok.  Renal function is stable.  pete

## 2012-05-06 NOTE — Telephone Encounter (Signed)
Called patient to let him know that his iron is ok and renal function is ok.  Patient asked to have testosterone drawn at next visit.  [place] with Dr. Niel Hummer. Orders put in computer.

## 2012-05-31 ENCOUNTER — Ambulatory Visit (HOSPITAL_BASED_OUTPATIENT_CLINIC_OR_DEPARTMENT_OTHER): Payer: BC Managed Care – PPO

## 2012-05-31 ENCOUNTER — Other Ambulatory Visit (HOSPITAL_BASED_OUTPATIENT_CLINIC_OR_DEPARTMENT_OTHER): Payer: BC Managed Care – PPO | Admitting: Lab

## 2012-05-31 VITALS — BP 145/67 | HR 65 | Temp 97.5°F | Resp 20

## 2012-05-31 DIAGNOSIS — N189 Chronic kidney disease, unspecified: Secondary | ICD-10-CM

## 2012-05-31 DIAGNOSIS — C61 Malignant neoplasm of prostate: Secondary | ICD-10-CM

## 2012-05-31 DIAGNOSIS — D649 Anemia, unspecified: Secondary | ICD-10-CM

## 2012-05-31 DIAGNOSIS — N289 Disorder of kidney and ureter, unspecified: Secondary | ICD-10-CM

## 2012-05-31 DIAGNOSIS — I4891 Unspecified atrial fibrillation: Secondary | ICD-10-CM

## 2012-05-31 LAB — CBC WITH DIFFERENTIAL (CANCER CENTER ONLY)
BASO#: 0 10*3/uL (ref 0.0–0.2)
EOS%: 2 % (ref 0.0–7.0)
Eosinophils Absolute: 0.1 10*3/uL (ref 0.0–0.5)
LYMPH%: 19.1 % (ref 14.0–48.0)
MCH: 27.6 pg — ABNORMAL LOW (ref 28.0–33.4)
MCHC: 33.5 g/dL (ref 32.0–35.9)
MCV: 82 fL (ref 82–98)
MONO%: 7.3 % (ref 0.0–13.0)
NEUT#: 4.6 10*3/uL (ref 1.5–6.5)
Platelets: 272 10*3/uL (ref 145–400)

## 2012-05-31 LAB — BASIC METABOLIC PANEL
BUN: 42 mg/dL — ABNORMAL HIGH (ref 6–23)
Chloride: 103 mEq/L (ref 96–112)
Creatinine, Ser: 1.91 mg/dL — ABNORMAL HIGH (ref 0.50–1.35)
Glucose, Bld: 104 mg/dL — ABNORMAL HIGH (ref 70–99)
Potassium: 5 mEq/L (ref 3.5–5.3)

## 2012-05-31 LAB — PROTIME-INR (CHCC SATELLITE)
INR: 2.2 (ref 2.0–3.5)
Protime: 26.4 Seconds — ABNORMAL HIGH (ref 10.6–13.4)

## 2012-05-31 MED ORDER — DARBEPOETIN ALFA-POLYSORBATE 200 MCG/0.4ML IJ SOLN
200.0000 ug | Freq: Once | INTRAMUSCULAR | Status: AC
  Administered 2012-05-31: 200 ug via SUBCUTANEOUS

## 2012-05-31 NOTE — Patient Instructions (Signed)
Darbepoetin Alfa injection What is this medicine? DARBEPOETIN ALFA (dar be POE e tin AL fa) helps your body make more red blood cells. It is used to treat anemia caused by chronic kidney failure and chemotherapy. This medicine may be used for other purposes; ask your health care provider or pharmacist if you have questions. What should I tell my health care provider before I take this medicine? They need to know if you have any of these conditions: -blood clotting disorders or history of blood clots -cancer patient not on chemotherapy -cystic fibrosis -heart disease, such as angina, heart failure, or a history of a heart attack -hemoglobin level of 12 g/dL or greater -high blood pressure -low levels of folate, iron, or vitamin B12 -seizures -an unusual or allergic reaction to darbepoetin, erythropoietin, albumin, hamster proteins, latex, other medicines, foods, dyes, or preservatives -pregnant or trying to get pregnant -breast-feeding How should I use this medicine? This medicine is for injection into a vein or under the skin. It is usually given by a health care professional in a hospital or clinic setting. If you get this medicine at home, you will be taught how to prepare and give this medicine. Do not shake the solution before you withdraw a dose. Use exactly as directed. Take your medicine at regular intervals. Do not take your medicine more often than directed. It is important that you put your used needles and syringes in a special sharps container. Do not put them in a trash can. If you do not have a sharps container, call your pharmacist or healthcare provider to get one. Talk to your pediatrician regarding the use of this medicine in children. While this medicine may be used in children as young as 1 year for selected conditions, precautions do apply. Overdosage: If you think you have taken too much of this medicine contact a poison control center or emergency room at once. NOTE:  This medicine is only for you. Do not share this medicine with others. What if I miss a dose? If you miss a dose, take it as soon as you can. If it is almost time for your next dose, take only that dose. Do not take double or extra doses. What may interact with this medicine? Do not take this medicine with any of the following medications: -epoetin alfa This list may not describe all possible interactions. Give your health care provider a list of all the medicines, herbs, non-prescription drugs, or dietary supplements you use. Also tell them if you smoke, drink alcohol, or use illegal drugs. Some items may interact with your medicine. What should I watch for while using this medicine? Visit your prescriber or health care professional for regular checks on your progress and for the needed blood tests and blood pressure measurements. It is especially important for the doctor to make sure your hemoglobin level is in the desired range, to limit the risk of potential side effects and to give you the best benefit. Keep all appointments for any recommended tests. Check your blood pressure as directed. Ask your doctor what your blood pressure should be and when you should contact him or her. As your body makes more red blood cells, you may need to take iron, folic acid, or vitamin B supplements. Ask your doctor or health care provider which products are right for you. If you have kidney disease continue dietary restrictions, even though this medication can make you feel better. Talk with your doctor or health care professional about the   foods you eat and the vitamins that you take. What side effects may I notice from receiving this medicine? Side effects that you should report to your doctor or health care professional as soon as possible: -allergic reactions like skin rash, itching or hives, swelling of the face, lips, or tongue -breathing problems -changes in vision -chest pain -confusion, trouble speaking  or understanding -feeling faint or lightheaded, falls -high blood pressure -muscle aches or pains -pain, swelling, warmth in the leg -rapid weight gain -severe headaches -sudden numbness or weakness of the face, arm or leg -trouble walking, dizziness, loss of balance or coordination -seizures (convulsions) -swelling of the ankles, feet, hands -unusually weak or tired Side effects that usually do not require medical attention (report to your doctor or health care professional if they continue or are bothersome): -diarrhea -fever, chills (flu-like symptoms) -headaches -nausea, vomiting -redness, stinging, or swelling at site where injected This list may not describe all possible side effects. Call your doctor for medical advice about side effects. You may report side effects to FDA at 1-800-FDA-1088. Where should I keep my medicine? Keep out of the reach of children. Store in a refrigerator between 2 and 8 degrees C (36 and 46 degrees F). Do not freeze. Do not shake. Throw away any unused portion if using a single-dose vial. Throw away any unused medicine after the expiration date. NOTE: This sheet is a summary. It may not cover all possible information. If you have questions about this medicine, talk to your doctor, pharmacist, or health care provider.  2012, [employer]. (03/31/2008 10:23:57 AM)

## 2012-06-15 ENCOUNTER — Other Ambulatory Visit: Payer: Self-pay

## 2012-06-21 ENCOUNTER — Ambulatory Visit: Payer: BC Managed Care – PPO | Admitting: Lab

## 2012-06-21 LAB — PROTIME-INR (CHCC SATELLITE): INR: 1.4 — ABNORMAL LOW (ref 2.0–3.5)

## 2012-06-24 ENCOUNTER — Other Ambulatory Visit: Payer: Self-pay | Admitting: *Deleted

## 2012-06-24 DIAGNOSIS — D509 Iron deficiency anemia, unspecified: Secondary | ICD-10-CM

## 2012-06-24 MED ORDER — FONDAPARINUX SODIUM 10 MG/0.8ML ~~LOC~~ SOLN: 10.0000 mg | SUBCUTANEOUS | Status: DC

## 2012-06-24 NOTE — Telephone Encounter (Signed)
Pt was in for an INR check on 06/22/11. He needs to continue his Arixtra injections until his INR is therapeutic. Asked for 3 more injections as he has "a couple at home and I think it will last me until my INR is up". Sent via e-rx to [facility].

## 2012-06-28 ENCOUNTER — Ambulatory Visit (HOSPITAL_BASED_OUTPATIENT_CLINIC_OR_DEPARTMENT_OTHER): Payer: BC Managed Care – PPO

## 2012-06-28 ENCOUNTER — Other Ambulatory Visit: Payer: Self-pay | Admitting: *Deleted

## 2012-06-28 ENCOUNTER — Ambulatory Visit (HOSPITAL_BASED_OUTPATIENT_CLINIC_OR_DEPARTMENT_OTHER): Payer: BC Managed Care – PPO | Admitting: Lab

## 2012-06-28 VITALS — BP 126/57 | HR 67 | Temp 97.6°F | Resp 20

## 2012-06-28 DIAGNOSIS — I4891 Unspecified atrial fibrillation: Secondary | ICD-10-CM

## 2012-06-28 DIAGNOSIS — N289 Disorder of kidney and ureter, unspecified: Secondary | ICD-10-CM

## 2012-06-28 DIAGNOSIS — D649 Anemia, unspecified: Secondary | ICD-10-CM

## 2012-06-28 DIAGNOSIS — D631 Anemia in chronic kidney disease: Secondary | ICD-10-CM

## 2012-06-28 DIAGNOSIS — C61 Malignant neoplasm of prostate: Secondary | ICD-10-CM

## 2012-06-28 LAB — CBC WITH DIFFERENTIAL (CANCER CENTER ONLY)
BASO%: 0.5 % (ref 0.0–2.0)
EOS%: 3.1 % (ref 0.0–7.0)
MCH: 27.6 pg — ABNORMAL LOW (ref 28.0–33.4)
MCHC: 32.6 g/dL (ref 32.0–35.9)
MONO%: 6.1 % (ref 0.0–13.0)
NEUT#: 4.3 10*3/uL (ref 1.5–6.5)
Platelets: 318 10*3/uL (ref 145–400)

## 2012-06-28 LAB — IRON AND TIBC: %SAT: 15 % — ABNORMAL LOW (ref 20–55)

## 2012-06-28 LAB — PROTIME-INR (CHCC SATELLITE)
INR: 1.8 — ABNORMAL LOW (ref 2.0–3.5)
Protime: 21.6 Seconds — ABNORMAL HIGH (ref 10.6–13.4)

## 2012-06-28 MED ORDER — DARBEPOETIN ALFA-POLYSORBATE 200 MCG/0.4ML IJ SOLN
300.0000 ug | Freq: Once | INTRAMUSCULAR | Status: AC
  Administered 2012-06-28: 300 ug via SUBCUTANEOUS

## 2012-06-28 MED ORDER — FONDAPARINUX SODIUM 10 MG/0.8ML ~~LOC~~ SOLN: 10.0000 mg | SUBCUTANEOUS | Status: DC

## 2012-06-28 NOTE — Telephone Encounter (Signed)
To continue Atrixtra for 6 more days per Dr Marin Olp. INR is still subtherapeutic. Pt only requested 6 injections.

## 2012-06-28 NOTE — Patient Instructions (Signed)
Darbepoetin Alfa injection What is this medicine? DARBEPOETIN ALFA (dar be POE e tin AL fa) helps your body make more red blood cells. It is used to treat anemia caused by chronic kidney failure and chemotherapy. This medicine may be used for other purposes; ask your health care provider or pharmacist if you have questions. What should I tell my health care provider before I take this medicine? They need to know if you have any of these conditions: -blood clotting disorders or history of blood clots -cancer patient not on chemotherapy -cystic fibrosis -heart disease, such as angina, heart failure, or a history of a heart attack -hemoglobin level of 12 g/dL or greater -high blood pressure -low levels of folate, iron, or vitamin B12 -seizures -an unusual or allergic reaction to darbepoetin, erythropoietin, albumin, hamster proteins, latex, other medicines, foods, dyes, or preservatives -pregnant or trying to get pregnant -breast-feeding How should I use this medicine? This medicine is for injection into a vein or under the skin. It is usually given by a health care professional in a hospital or clinic setting. If you get this medicine at home, you will be taught how to prepare and give this medicine. Do not shake the solution before you withdraw a dose. Use exactly as directed. Take your medicine at regular intervals. Do not take your medicine more often than directed. It is important that you put your used needles and syringes in a special sharps container. Do not put them in a trash can. If you do not have a sharps container, call your pharmacist or healthcare provider to get one. Talk to your pediatrician regarding the use of this medicine in children. While this medicine may be used in children as young as 1 year for selected conditions, precautions do apply. Overdosage: If you think you have taken too much of this medicine contact a poison control center or emergency room at once. NOTE:  This medicine is only for you. Do not share this medicine with others. What if I miss a dose? If you miss a dose, take it as soon as you can. If it is almost time for your next dose, take only that dose. Do not take double or extra doses. What may interact with this medicine? Do not take this medicine with any of the following medications: -epoetin alfa This list may not describe all possible interactions. Give your health care provider a list of all the medicines, herbs, non-prescription drugs, or dietary supplements you use. Also tell them if you smoke, drink alcohol, or use illegal drugs. Some items may interact with your medicine. What should I watch for while using this medicine? Visit your prescriber or health care professional for regular checks on your progress and for the needed blood tests and blood pressure measurements. It is especially important for the doctor to make sure your hemoglobin level is in the desired range, to limit the risk of potential side effects and to give you the best benefit. Keep all appointments for any recommended tests. Check your blood pressure as directed. Ask your doctor what your blood pressure should be and when you should contact him or her. As your body makes more red blood cells, you may need to take iron, folic acid, or vitamin B supplements. Ask your doctor or health care provider which products are right for you. If you have kidney disease continue dietary restrictions, even though this medication can make you feel better. Talk with your doctor or health care professional about the   foods you eat and the vitamins that you take. What side effects may I notice from receiving this medicine? Side effects that you should report to your doctor or health care professional as soon as possible: -allergic reactions like skin rash, itching or hives, swelling of the face, lips, or tongue -breathing problems -changes in vision -chest pain -confusion, trouble speaking  or understanding -feeling faint or lightheaded, falls -high blood pressure -muscle aches or pains -pain, swelling, warmth in the leg -rapid weight gain -severe headaches -sudden numbness or weakness of the face, arm or leg -trouble walking, dizziness, loss of balance or coordination -seizures (convulsions) -swelling of the ankles, feet, hands -unusually weak or tired Side effects that usually do not require medical attention (report to your doctor or health care professional if they continue or are bothersome): -diarrhea -fever, chills (flu-like symptoms) -headaches -nausea, vomiting -redness, stinging, or swelling at site where injected This list may not describe all possible side effects. Call your doctor for medical advice about side effects. You may report side effects to FDA at 1-800-FDA-1088. Where should I keep my medicine? Keep out of the reach of children. Store in a refrigerator between 2 and 8 degrees C (36 and 46 degrees F). Do not freeze. Do not shake. Throw away any unused portion if using a single-dose vial. Throw away any unused medicine after the expiration date. NOTE: This sheet is a summary. It may not cover all possible information. If you have questions about this medicine, talk to your doctor, pharmacist, or health care provider.  2013, [employer]. (03/31/2008 10:23:57 AM)

## 2012-07-01 ENCOUNTER — Encounter (HOSPITAL_BASED_OUTPATIENT_CLINIC_OR_DEPARTMENT_OTHER): Payer: Self-pay | Admitting: *Deleted

## 2012-07-01 ENCOUNTER — Other Ambulatory Visit: Payer: Self-pay | Admitting: *Deleted

## 2012-07-01 ENCOUNTER — Inpatient Hospital Stay (HOSPITAL_BASED_OUTPATIENT_CLINIC_OR_DEPARTMENT_OTHER)
Admission: EM | Admit: 2012-07-01 | Discharge: 2012-07-07 | DRG: 452 | Disposition: A | Discharge status: Home / Self Care | Payer: BC Managed Care – PPO | Attending: Internal Medicine | Admitting: Internal Medicine

## 2012-07-01 ENCOUNTER — Emergency Department (HOSPITAL_BASED_OUTPATIENT_CLINIC_OR_DEPARTMENT_OTHER): Payer: BC Managed Care – PPO

## 2012-07-01 ENCOUNTER — Other Ambulatory Visit (HOSPITAL_BASED_OUTPATIENT_CLINIC_OR_DEPARTMENT_OTHER): Payer: BC Managed Care – PPO | Admitting: Lab

## 2012-07-01 DIAGNOSIS — Z923 Personal history of irradiation: Secondary | ICD-10-CM

## 2012-07-01 DIAGNOSIS — D631 Anemia in chronic kidney disease: Secondary | ICD-10-CM

## 2012-07-01 DIAGNOSIS — I4892 Unspecified atrial flutter: Secondary | ICD-10-CM

## 2012-07-01 DIAGNOSIS — Z6838 Body mass index (BMI) 38.0-38.9, adult: Secondary | ICD-10-CM

## 2012-07-01 DIAGNOSIS — R609 Edema, unspecified: Secondary | ICD-10-CM | POA: Diagnosis present

## 2012-07-01 DIAGNOSIS — N039 Chronic nephritic syndrome with unspecified morphologic changes: Secondary | ICD-10-CM | POA: Diagnosis present

## 2012-07-01 DIAGNOSIS — I712 Thoracic aortic aneurysm, without rupture, unspecified: Secondary | ICD-10-CM

## 2012-07-01 DIAGNOSIS — Z9089 Acquired absence of other organs: Secondary | ICD-10-CM

## 2012-07-01 DIAGNOSIS — N183 Chronic kidney disease, stage 3 unspecified: Secondary | ICD-10-CM

## 2012-07-01 DIAGNOSIS — M545 Low back pain, unspecified: Secondary | ICD-10-CM

## 2012-07-01 DIAGNOSIS — Z954 Presence of other heart-valve replacement: Secondary | ICD-10-CM

## 2012-07-01 DIAGNOSIS — I129 Hypertensive chronic kidney disease with stage 1 through stage 4 chronic kidney disease, or unspecified chronic kidney disease: Secondary | ICD-10-CM | POA: Diagnosis present

## 2012-07-01 DIAGNOSIS — C6290 Malignant neoplasm of unspecified testis, unspecified whether descended or undescended: Secondary | ICD-10-CM

## 2012-07-01 DIAGNOSIS — D649 Anemia, unspecified: Secondary | ICD-10-CM

## 2012-07-01 DIAGNOSIS — I4891 Unspecified atrial fibrillation: Secondary | ICD-10-CM

## 2012-07-01 DIAGNOSIS — K259 Gastric ulcer, unspecified as acute or chronic, without hemorrhage or perforation: Secondary | ICD-10-CM

## 2012-07-01 DIAGNOSIS — Z888 Allergy status to other drugs, medicaments and biological substances status: Secondary | ICD-10-CM

## 2012-07-01 DIAGNOSIS — Z79899 Other long term (current) drug therapy: Secondary | ICD-10-CM

## 2012-07-01 DIAGNOSIS — Z8679 Personal history of other diseases of the circulatory system: Secondary | ICD-10-CM

## 2012-07-01 DIAGNOSIS — D509 Iron deficiency anemia, unspecified: Secondary | ICD-10-CM

## 2012-07-01 DIAGNOSIS — N289 Disorder of kidney and ureter, unspecified: Secondary | ICD-10-CM

## 2012-07-01 DIAGNOSIS — Z7901 Long term (current) use of anticoagulants: Secondary | ICD-10-CM

## 2012-07-01 DIAGNOSIS — R600 Localized edema: Secondary | ICD-10-CM

## 2012-07-01 DIAGNOSIS — K2990 Gastroduodenitis, unspecified, without bleeding: Secondary | ICD-10-CM

## 2012-07-01 DIAGNOSIS — Z87891 Personal history of nicotine dependence: Secondary | ICD-10-CM

## 2012-07-01 DIAGNOSIS — K922 Gastrointestinal hemorrhage, unspecified: Secondary | ICD-10-CM

## 2012-07-01 DIAGNOSIS — Y92009 Unspecified place in unspecified non-institutional (private) residence as the place of occurrence of the external cause: Secondary | ICD-10-CM

## 2012-07-01 DIAGNOSIS — N179 Acute kidney failure, unspecified: Secondary | ICD-10-CM

## 2012-07-01 DIAGNOSIS — Y838 Other surgical procedures as the cause of abnormal reaction of the patient, or of later complication, without mention of misadventure at the time of the procedure: Secondary | ICD-10-CM | POA: Diagnosis present

## 2012-07-01 DIAGNOSIS — E785 Hyperlipidemia, unspecified: Secondary | ICD-10-CM

## 2012-07-01 DIAGNOSIS — IMO0002 Reserved for concepts with insufficient information to code with codable children: Principal | ICD-10-CM | POA: Diagnosis present

## 2012-07-01 DIAGNOSIS — E669 Obesity, unspecified: Secondary | ICD-10-CM | POA: Diagnosis present

## 2012-07-01 DIAGNOSIS — M109 Gout, unspecified: Secondary | ICD-10-CM

## 2012-07-01 DIAGNOSIS — K2970 Gastritis, unspecified, without bleeding: Secondary | ICD-10-CM | POA: Diagnosis present

## 2012-07-01 DIAGNOSIS — R21 Rash and other nonspecific skin eruption: Secondary | ICD-10-CM

## 2012-07-01 DIAGNOSIS — Z952 Presence of prosthetic heart valve: Secondary | ICD-10-CM

## 2012-07-01 DIAGNOSIS — E291 Testicular hypofunction: Secondary | ICD-10-CM

## 2012-07-01 DIAGNOSIS — I2510 Atherosclerotic heart disease of native coronary artery without angina pectoris: Secondary | ICD-10-CM

## 2012-07-01 DIAGNOSIS — C61 Malignant neoplasm of prostate: Secondary | ICD-10-CM

## 2012-07-01 DIAGNOSIS — M25561 Pain in right knee: Secondary | ICD-10-CM

## 2012-07-01 DIAGNOSIS — Q251 Coarctation of aorta: Secondary | ICD-10-CM

## 2012-07-01 DIAGNOSIS — Z8547 Personal history of malignant neoplasm of testis: Secondary | ICD-10-CM

## 2012-07-01 DIAGNOSIS — I10 Essential (primary) hypertension: Secondary | ICD-10-CM

## 2012-07-01 DIAGNOSIS — D62 Acute posthemorrhagic anemia: Secondary | ICD-10-CM

## 2012-07-01 HISTORY — DX: Polyp of stomach and duodenum: K31.7

## 2012-07-01 HISTORY — DX: Other specified postprocedural states: R11.2

## 2012-07-01 HISTORY — DX: Other specified postprocedural states: Z98.890

## 2012-07-01 HISTORY — DX: Ulcer of intestine: K63.3

## 2012-07-01 HISTORY — DX: Benign neoplasm of unspecified testis: D29.20

## 2012-07-01 LAB — CBC
HCT: 20.8 % — ABNORMAL LOW (ref 39.0–52.0)
Platelets: 267 10*3/uL (ref 150–400)
RDW: 16 % — ABNORMAL HIGH (ref 11.5–15.5)
WBC: 10.1 10*3/uL (ref 4.0–10.5)

## 2012-07-01 LAB — CBC WITH DIFFERENTIAL (CANCER CENTER ONLY)
BASO#: 0 10*3/uL (ref 0.0–0.2)
BASO%: 0.2 % (ref 0.0–2.0)
HCT: 21.8 % — ABNORMAL LOW (ref 38.7–49.9)
HGB: 7 g/dL — CL (ref 13.0–17.1)
LYMPH#: 0.8 10*3/uL — ABNORMAL LOW (ref 0.9–3.3)
MONO#: 0.5 10*3/uL (ref 0.1–0.9)
NEUT%: 84.4 % — ABNORMAL HIGH (ref 40.0–80.0)
RDW: 15.4 % (ref 11.1–15.7)
WBC: 9 10*3/uL (ref 4.0–10.0)

## 2012-07-01 LAB — HOLD TUBE, BLOOD BANK - CHCC SATELLITE

## 2012-07-01 LAB — BASIC METABOLIC PANEL
Chloride: 106 mEq/L (ref 96–112)
Creatinine, Ser: 3.2 mg/dL — ABNORMAL HIGH (ref 0.50–1.35)
GFR calc Af Amer: 24 mL/min — ABNORMAL LOW (ref >90)

## 2012-07-01 LAB — OCCULT BLOOD X 1 CARD TO LAB, STOOL: Fecal Occult Bld: POSITIVE — AB

## 2012-07-01 LAB — MRSA PCR SCREENING: MRSA by PCR: NEGATIVE

## 2012-07-01 MED ORDER — PANTOPRAZOLE SODIUM 40 MG IV SOLR
40.0000 mg | Freq: Once | INTRAVENOUS | Status: AC
  Administered 2012-07-02: 40 mg via INTRAVENOUS
  Filled 2012-07-01 (×2): qty 40

## 2012-07-01 MED ORDER — SODIUM CHLORIDE 0.9 % IJ SOLN
3.0000 mL | Freq: Two times a day (BID) | INTRAMUSCULAR | Status: DC
  Administered 2012-07-01 – 2012-07-05 (×7): 3 mL via INTRAVENOUS
  Administered 2012-07-06: 10:00:00 via INTRAVENOUS
  Administered 2012-07-06: 3 mL via INTRAVENOUS

## 2012-07-01 MED ORDER — SODIUM CHLORIDE 0.9 % IV BOLUS (SEPSIS): 2000.0000 mL | Freq: Once | INTRAVENOUS | Status: DC

## 2012-07-01 MED ORDER — SODIUM CHLORIDE 0.9 % IV BOLUS (SEPSIS)
1000.0000 mL | Freq: Once | INTRAVENOUS | Status: AC
  Administered 2012-07-01: 1000 mL via INTRAVENOUS

## 2012-07-01 MED ORDER — SODIUM CHLORIDE 0.9 % IV SOLN
8.0000 mg/h | INTRAVENOUS | Status: DC
  Administered 2012-07-01: 8 mg/h via INTRAVENOUS
  Filled 2012-07-01 (×5): qty 80

## 2012-07-01 MED ORDER — SODIUM CHLORIDE 0.9 % IV SOLN
INTRAVENOUS | Status: AC
  Administered 2012-07-01: 1000 mL via INTRAVENOUS
  Administered 2012-07-02: 01:00:00 via INTRAVENOUS

## 2012-07-01 MED ORDER — ACETAMINOPHEN 325 MG PO TABS
650.0000 mg | ORAL_TABLET | ORAL | Status: DC | PRN
  Administered 2012-07-01 – 2012-07-04 (×2): 650 mg via ORAL
  Filled 2012-07-01 (×2): qty 2

## 2012-07-01 MED ORDER — SODIUM CHLORIDE 0.9 % IV BOLUS (SEPSIS): 1000.0000 mL | Freq: Once | INTRAVENOUS | Status: DC

## 2012-07-01 NOTE — Progress Notes (Addendum)
@   2000 Pt temp 100.1  & BP 167/49. Notified K.Schorr midlevel. New orders received for temp. No new orders received for BP. 2 units of blood on hold until pt temperature decreases.  @ 2200 Pt temp 99.7 after medication given. Notified K.Schorr. K.Schorr requested Blood to be transfused.  Order for FFP on hold for central line placement. No central placed. Notified K.Schorr midlevel of order. No new orders received.

## 2012-07-01 NOTE — ED Provider Notes (Signed)
History     CSN: [id number]  Arrival date & time 07/01/12  1158   First MD Initiated Contact with Patient 07/01/12 1210      Chief Complaint  Patient presents with  . Rectal Bleeding    (Consider location/radiation/quality/duration/timing/severity/associated sxs/prior treatment) HPI Comments: 57 year old male with a history of anemia, A. fib multiple valve replacements s/p coarctation of aorta as an infant and CAD presents to the emergency department with his wife after having blood drawn upstairs earlier today in Dr. Antonieta Pert office with a resulted hemoglobin of 7.0 and being told to come to the emergency department. Patient had a colonoscopy on 06/17/12 when he had 2 polyps removed from his stomach. He states about 2 days later he had his first bowel movement which was black tarry BM. Since then he has not had a normal bowel movement, they have all been black and tarry. He is on Coumadin and was also recently put on Arixtra which she was told may make his stools dark and tarry. On January 31 patient's hemoglobin was 10.7 and has been slowly decreasing since. Last INR on 2/28  Was 1.8. Currently he is feeling weak,  Fatigued and short of breath with no energy. Also complaining of gradual onset headache x5 days. Describes the headache as "it is difficult to concentrate and annoying". Admits to dry heaving a few days ago. He also has a conjunctival hemorrhage present for the past 4 days, however he is unsure if this appeared prior to or after the headache and a dry heaving began. GI doctor is Dr. Ivin Booty with [facility]. Cardiologist Dr. Stanford Breed.   Patient is a 57 y.o. male presenting with hematochezia. The history is provided by the patient and the spouse.  Rectal Bleeding  Associated symptoms include nausea and headaches. Pertinent negatives include no abdominal pain, no diarrhea, no rectal pain and no chest pain.    Past Medical History  Diagnosis Date  . Anemia   . 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 HTN (hypertension)   . CAD (coronary artery disease)   . Aortic aneurysm   . Gout   . Atrial fibrillation   . Anemia   . Hyperlipidemia   . Atrial flutter   . Obesity   . Anemia of renal disease 10/04/2011  . CKD (chronic kidney disease)     baseline creatinine is 1.5-2    Past Surgical History  Procedure Laterality Date  . Coarctation of aorta repair  1958  . Coarctation of aorta repair  1970  . Aortic valve replacement and mitral valve repair  1974    Bicuspid aortic valve  . Orchiectomy  1992    testicular cancer  . Aortic valve replacement  1995    [facility]  . Tonsillectomy    . Cardioversion  02/27/2012    Procedure: CARDIOVERSION;  Surgeon: Lelon Perla, MD;  Location: [facility] ENDOSCOPY;  Service: Cardiovascular;  Laterality: N/A;    Family History  Problem Relation Age of Onset  . Heart disease      No family history    History  Substance Use Topics  . Smoking status: Former Research scientist (life sciences)  . Smokeless tobacco: Never Used  . Alcohol Use: Yes     Comment: wine, occasionally      Review of Systems  Constitutional: Positive for appetite change (decreased) and fatigue.  HENT: Negative for neck stiffness.   Eyes: Positive for pain and redness.  Respiratory: Positive for  shortness of breath.   Cardiovascular: Negative for chest pain.  Gastrointestinal: Positive for nausea, blood in stool and hematochezia. Negative for abdominal pain, diarrhea, constipation and rectal pain.  Musculoskeletal: Negative for back pain.  Skin: Positive for pallor.  Neurological: Positive for weakness and headaches. Negative for dizziness, speech difficulty and light-headedness.  Psychiatric/Behavioral: Negative for confusion.  All other systems reviewed and are negative.    Allergies  Ace inhibitors  Home Medications   Current Outpatient Rx  Name  Route  Sig  Dispense  Refill  . acetaminophen (TYLENOL) 500 MG tablet   Oral   Take 500 mg by mouth  every 6 (six) hours as needed.           Marland Kitchen amLODipine (NORVASC) 10 MG tablet   Oral   Take 10 mg by mouth daily.          Marland Kitchen amoxicillin (AMOXIL) 500 MG capsule      Before dental appts.         Marland Kitchen b complex vitamins tablet   Oral   Take 1 tablet by mouth 2 (two) times daily.          . cloNIDine (CATAPRES) 0.1 MG tablet   Oral   Take 0.1 mg by mouth 2 (two) times daily.          Marland Kitchen COLCRYS 0.6 MG tablet   Oral   Take 0.6 mg by mouth daily.          . cyclobenzaprine (FLEXERIL) 10 MG tablet   Oral   Take 5 mg by mouth at bedtime as needed.           . fondaparinux (ARIXTRA) 10 MG/0.8ML SOLN   Subcutaneous   Inject 0.8 mLs (10 mg total) into the skin daily.   6 Syringe   0   . FORTESTA 10 MG/ACT (2%) GEL      APPLY 2 PUMPS TO THIGH ONCE DAILY   60 g   3   . furosemide (LASIX) 20 MG tablet      4 times weekly         . HYDROcodone-acetaminophen (VICODIN) 5-500 MG per tablet   Oral   Take 1 tablet by mouth every 6 (six) hours as needed for pain.   30 tablet   2   . labetalol (NORMODYNE) 200 MG tablet   Oral   Take 200 mg by mouth 3 (three) times daily.          . metoprolol (TOPROL-XL) 100 MG 24 hr tablet   Oral   Take 100 mg by mouth daily.          Marland Kitchen MOVIPREP 100 G SOLR               . olmesartan (BENICAR) 40 MG tablet   Oral   Take 1 tablet (40 mg total) by mouth daily.   90 tablet   3   . sildenafil (VIAGRA) 50 MG tablet   Oral   Take 50 mg by mouth daily as needed.           . triamcinolone cream (KENALOG) 0.1 %   Topical   Apply topically as needed.         Marland Kitchen ULORIC 40 MG tablet   Oral   Take 40 mg by mouth daily.          Marland Kitchen warfarin (COUMADIN) 7.5 MG tablet   Oral   Take by mouth daily.  BP 149/51  Temp(Src) 98.1 F (36.7 C) (Oral)  Ht 6\' 2"  (1.88 m)  Wt 300 lb (136.079 kg)  BMI 38.5 kg/m2  SpO2 100%  Physical Exam  Nursing note and vitals reviewed. Constitutional: He is oriented to  person, place, and time. He appears well-developed and well-nourished. No distress.  Appears fatigued.  Eyes: EOM are normal. Pupils are equal, round, and reactive to light. Left conjunctiva has a hemorrhage (see diagram).    IOP- 69mmHg R eye, 12 mmHg L eye  Neck: Normal range of motion. Neck supple.  Cardiovascular: Normal rate, regular rhythm and intact distal pulses.   Murmur heard. Mid-systolic click  Pulmonary/Chest: Effort normal and breath sounds normal. No respiratory distress.  Abdominal: Normal appearance and bowel sounds are normal. He exhibits no mass. There is tenderness (mild) in the left lower quadrant. There is no rigidity, no rebound and no guarding.  Musculoskeletal: Normal range of motion. He exhibits no edema.  Neurological: He is alert and oriented to person, place, and time. He has normal strength. No cranial nerve deficit.  Skin: Skin is warm and dry. There is pallor.  Psychiatric: He has a normal mood and affect. His behavior is normal.    ED Course  Procedures (including critical care time)  Labs Reviewed  OCCULT BLOOD X 1 CARD TO LAB, STOOL - Abnormal; Notable for the following:    Fecal Occult Bld POSITIVE (*)    All other components within normal limits  PROTIME-INR - Abnormal; Notable for the following:    Prothrombin Time 26.6 (*)    INR 2.60 (*)    All other components within normal limits   Ct Head Wo Contrast  07/01/2012  *RADIOLOGY REPORT*  Clinical Data:  Left periorbital erythema.  CT HEAD AND ORBITS WITHOUT CONTRAST  Technique:  Contiguous axial images were obtained from the base of the skull through the vertex without contrast. Multidetector CT imaging of the orbits was performed using the standard protocol without intravenous contrast.  Comparison:   None.  CT HEAD  Findings: The brain demonstrates no evidence of hemorrhage, infarction, edema, mass effect, extra-axial fluid collection, hydrocephalus or mass lesion.  The skull is unremarkable.   Mucosal thickening is present in the right maxillary antrum.  IMPRESSION: Unremarkable head CT.  The right maxillary mucosal sinus disease.  CT ORBITS  Findings: The orbits show a normal appearance bilaterally with no asymmetry noted.  No soft tissue swelling, hemorrhage or abscess is identified.  No evidence of intraorbital abnormality.  The globes are intact.  Extraocular muscles are symmetric.  No evidence of fracture.  Sinus disease present in both maxillary antra with prominent mucosal thickening in the right maxillary antrum and a small retention cyst inferiorly in the left maxillary antrum.  Other paranasal sinuses are normally aerated.  IMPRESSION: Unremarkable orbital CT.  Maxillary sinus disease present with mucosal thickening in the right maxillary antrum and a mucous retention cyst in the left maxillary antrum.   Original Report Authenticated By: Aletta Edouard, M.D.    Ct Orbitss W/o Cm  07/01/2012  *RADIOLOGY REPORT*  Clinical Data:  Left periorbital erythema.  CT HEAD AND ORBITS WITHOUT CONTRAST  Technique:  Contiguous axial images were obtained from the base of the skull through the vertex without contrast. Multidetector CT imaging of the orbits was performed using the standard protocol without intravenous contrast.  Comparison:   None.  CT HEAD  Findings: The brain demonstrates no evidence of hemorrhage, infarction, edema, mass effect, extra-axial fluid  collection, hydrocephalus or mass lesion.  The skull is unremarkable.  Mucosal thickening is present in the right maxillary antrum.  IMPRESSION: Unremarkable head CT.  The right maxillary mucosal sinus disease.  CT ORBITS  Findings: The orbits show a normal appearance bilaterally with no asymmetry noted.  No soft tissue swelling, hemorrhage or abscess is identified.  No evidence of intraorbital abnormality.  The globes are intact.  Extraocular muscles are symmetric.  No evidence of fracture.  Sinus disease present in both maxillary antra with  prominent mucosal thickening in the right maxillary antrum and a small retention cyst inferiorly in the left maxillary antrum.  Other paranasal sinuses are normally aerated.  IMPRESSION: Unremarkable orbital CT.  Maxillary sinus disease present with mucosal thickening in the right maxillary antrum and a mucous retention cyst in the left maxillary antrum.   Original Report Authenticated By: Aletta Edouard, M.D.      1. GI bleed   2. Anemia       MDM  57 y/o with GI bleed. Hgb 7.0. Hemodynamically stable. Therapeutic with INR of 2.6. I spoke with Dr. Stanford Breed who will consult patient in hospital due to Warfarin and long cardiac history. I also spoke with Dr. Antonieta Pert PA who states patient can come off Artrixa since he is therapeutic with his INR. CT head obtained due to headache with associated painful subconjunctival hemorrhage. CT normal. IOP WNL bilateral. He is accepted for transfer at [facility]. Stable for transfer. Case discussed with Dr. Jeanell Sparrow who also evaluated patient and agrees with plan of care.        Illene Labrador, PA-C 07/01/12 1534

## 2012-07-01 NOTE — ED Notes (Signed)
Called [facility]--speaking with Lennox Solders Dr. Jeanell Sparrow

## 2012-07-01 NOTE — ED Notes (Signed)
Patient states he had colonoscopy on 06/17/12 and had polyps removed.  States around 06/19/12 he had his first BM which was black tarry stools.  The black tarry stools have continued.  States he has had several lab draws and his hemoglobin has dropped from 10.7 on 05/31/12 to 7.0 today.  Was in Dr. Antonieta Pert office today for lab work and received an injection of Aranesp 15mcg injection on 06/29/12.  Denies any pain.  C/O feeling tired and skin is pale.

## 2012-07-01 NOTE — H&P (Signed)
[facility] History and Physical  Calvin [name] [id number] DOB: November 03, 1955 DOA: 07/01/2012  PCP: Jeb Levering, Philbert Riser, MD  Specialists: Cardiology, Dr. Stanford Breed Gastroenterology, Dr. Comer Locket  Chief Complaint: gi bleed, shortness of breath, lightheadedness and dizziness.    HPI: Calvin [name] is a 57 y.o. male with a history of aortic valve replacement on chronic anticoagulation, coronary heart disease, hyperlipidemia, status post coarctation of aorta as an infant, presents with gi bleed. He recently had a CSY on 06/17/12 and had polyps removed. For the colonoscopy was taken off of his chronic anticoagulation and bridged with Arixtra and now he is back on Coumadin. He is currently taking both medications. He presents to the emergency department with his wife after having blood drawn earlier today in Dr. Antonieta Pert office with a resulted hemoglobin of 7.0 and being told to come to the emergency department. Patient had a colonoscopy on 06/17/12 when he had 2 polyps removed from his stomach. He states about 2 days later he had his first bowel movement which was black tarry BM. Since then he has not had a normal bowel movement, they have all been black and tarry. He is on Coumadin and was also recently put on Arixtra which she was told may make his stools dark and tarry. On January 31 patient's hemoglobin was 10.7 and has been slowly decreasing since. He couldn't endorses lightheadedness and dizziness upon standing up. He feels chilly. He also endorses headache last 5 days. Endorses nausea and dry heaves. He also has a conjunctival hemorrhage present for the past 4 days, however he is unsure if this appeared prior to or after the headache and a dry heaving. Cardiologist Dr. Stanford Breed.  Review of Systems: As per history of present illness otherwise negative  Past Medical History  Diagnosis Date  . Anemia   . 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 HTN (hypertension)   . CAD (coronary  artery disease)   . Aortic aneurysm   . Gout   . Atrial fibrillation   . Anemia   . Hyperlipidemia   . Atrial flutter   . Obesity   . Anemia of renal disease 10/04/2011  . CKD (chronic kidney disease)     baseline creatinine is 1.5-2   Past Surgical History  Procedure Laterality Date  . Coarctation of aorta repair  1958  . Coarctation of aorta repair  1970  . Aortic valve replacement and mitral valve repair  1974    Bicuspid aortic valve  . Orchiectomy  1992    testicular cancer  . Aortic valve replacement  1995    [facility]  . Tonsillectomy    . Cardioversion  02/27/2012    Procedure: CARDIOVERSION;  Surgeon: Lelon Perla, MD;  Location: [facility] ENDOSCOPY;  Service: Cardiovascular;  Laterality: N/A;   Social History:  reports that he has quit smoking. He has never used smokeless tobacco. He reports that  drinks alcohol. He reports that he does not use illicit drugs.   Allergies  Allergen Reactions  . Ace Inhibitors     Family History  Problem Relation Age of Onset  . Heart disease      No family history    Prior to Admission medications   Medication Sig Start Date End Date Taking? Authorizing Provider  fondaparinux (ARIXTRA) 10 MG/0.8ML SOLN Inject 0.8 mLs (10 mg total) into the skin daily. 06/28/12  Yes Volanda Napoleon, MD  furosemide (LASIX) 20 MG tablet 4 times  weekly   Yes Historical Provider, MD  predniSONE (STERAPRED UNI-PAK) 10 MG tablet Take 10 mg by mouth daily.   Yes Historical Provider, MD  acetaminophen (TYLENOL) 500 MG tablet Take 500 mg by mouth every 6 (six) hours as needed.      Historical Provider, MD  amLODipine (NORVASC) 10 MG tablet Take 10 mg by mouth daily.  01/08/11   Historical Provider, MD  amoxicillin (AMOXIL) 500 MG capsule Before dental appts. 09/13/11   Historical Provider, MD  b complex vitamins tablet Take 1 tablet by mouth 2 (two) times daily.     Historical Provider, MD  cloNIDine (CATAPRES) 0.1 MG tablet Take 0.1 mg by mouth 2 (two)  times daily.  01/08/11   Historical Provider, MD  COLCRYS 0.6 MG tablet Take 0.6 mg by mouth daily.  04/10/12   Historical Provider, MD  cyclobenzaprine (FLEXERIL) 10 MG tablet Take 5 mg by mouth at bedtime as needed.      Historical Provider, MD  FORTESTA 10 MG/ACT (2%) GEL APPLY 2 PUMPS TO THIGH ONCE DAILY 03/25/12   Volanda Napoleon, MD  HYDROcodone-acetaminophen (VICODIN) 5-500 MG per tablet Take 1 tablet by mouth every 6 (six) hours as needed for pain. 11/23/11   Burnice Logan, MD  labetalol (NORMODYNE) 200 MG tablet Take 200 mg by mouth 3 (three) times daily.  01/08/11   Historical Provider, MD  metoprolol (TOPROL-XL) 100 MG 24 hr tablet Take 100 mg by mouth daily.  01/08/11   Historical Provider, MD  MOVIPREP [place]  03/26/12   Historical Provider, MD  olmesartan (BENICAR) 40 MG tablet Take 1 tablet (40 mg total) by mouth daily. 08/18/11 08/17/12  Lelon Perla, MD  sildenafil (VIAGRA) 50 MG tablet Take 50 mg by mouth daily as needed.      Historical Provider, MD  triamcinolone cream (KENALOG) 0.1 % Apply topically as needed. 07/06/11 07/05/12  Burnice Logan, MD  ULORIC 40 MG tablet Take 80 mg by mouth daily.  04/10/12   Historical Provider, MD  warfarin (COUMADIN) 7.5 MG tablet Take by mouth daily.  01/08/11   Historical Provider, MD   Physical Exam: Filed Vitals:   07/01/12 1342 07/01/12 1400 07/01/12 1500 07/01/12 1600  BP: 135/53 144/51 137/50 139/49  Pulse: 71 70 71 73  Temp:      TempSrc:      Resp: 22 21 19 23   Height:      Weight:      SpO2: 96% 97% 95% 97%     General:   In no acute distress   Eyes:  left-sided conjunctival hemorrhage  ENT:  moist oropharynx   Neck:  supple,   Cardiovascular: regular rate, mechanical click present  Respiratory: CTA biL  Abdomen: soft, NTTP  Skin: no rashes, pale  Musculoskeletal: no edema  Psychiatric: normal mood and affect, appears anxious  Neurologic: no focal findings.   Labs on Admission:  CBC:  Recent Labs Lab  06/28/12 1052 07/01/12 1115  WBC 6.1 9.0  NEUTROABS 4.3 7.6*  HGB 7.1* 7.0*  HCT 21.8* 21.8*  MCV 85 87  PLT 318 302   Radiological Exams on Admission: Ct Head Wo Contrast  07/01/2012  *RADIOLOGY REPORT*  Clinical Data:  Left periorbital erythema.  CT HEAD AND ORBITS WITHOUT CONTRAST  Technique:  Contiguous axial images were obtained from the base of the skull through the vertex without contrast. Multidetector CT imaging of the orbits was performed using the standard protocol without intravenous  contrast.  Comparison:   None.  CT HEAD  Findings: The brain demonstrates no evidence of hemorrhage, infarction, edema, mass effect, extra-axial fluid collection, hydrocephalus or mass lesion.  The skull is unremarkable.  Mucosal thickening is present in the right maxillary antrum.  IMPRESSION: Unremarkable head CT.  The right maxillary mucosal sinus disease.  CT ORBITS  Findings: The orbits show a normal appearance bilaterally with no asymmetry noted.  No soft tissue swelling, hemorrhage or abscess is identified.  No evidence of intraorbital abnormality.  The globes are intact.  Extraocular muscles are symmetric.  No evidence of fracture.  Sinus disease present in both maxillary antra with prominent mucosal thickening in the right maxillary antrum and a small retention cyst inferiorly in the left maxillary antrum.  Other paranasal sinuses are normally aerated.  IMPRESSION: Unremarkable orbital CT.  Maxillary sinus disease present with mucosal thickening in the right maxillary antrum and a mucous retention cyst in the left maxillary antrum.   Original Report Authenticated By: Aletta Edouard, M.D.    Ct Orbitss W/o Cm  07/01/2012  *RADIOLOGY REPORT*  Clinical Data:  Left periorbital erythema.  CT HEAD AND ORBITS WITHOUT CONTRAST  Technique:  Contiguous axial images were obtained from the base of the skull through the vertex without contrast. Multidetector CT imaging of the orbits was performed using the standard  protocol without intravenous contrast.  Comparison:   None.  CT HEAD  Findings: The brain demonstrates no evidence of hemorrhage, infarction, edema, mass effect, extra-axial fluid collection, hydrocephalus or mass lesion.  The skull is unremarkable.  Mucosal thickening is present in the right maxillary antrum.  IMPRESSION: Unremarkable head CT.  The right maxillary mucosal sinus disease.  CT ORBITS  Findings: The orbits show a normal appearance bilaterally with no asymmetry noted.  No soft tissue swelling, hemorrhage or abscess is identified.  No evidence of intraorbital abnormality.  The globes are intact.  Extraocular muscles are symmetric.  No evidence of fracture.  Sinus disease present in both maxillary antra with prominent mucosal thickening in the right maxillary antrum and a small retention cyst inferiorly in the left maxillary antrum.  Other paranasal sinuses are normally aerated.  IMPRESSION: Unremarkable orbital CT.  Maxillary sinus disease present with mucosal thickening in the right maxillary antrum and a mucous retention cyst in the left maxillary antrum.   Original Report Authenticated By: Aletta Edouard, M.D.    Assessment/Plan Active Problems:   CAD (coronary artery disease)   Hyperlipidemia   GI bleed   Acute blood loss anemia  1. GI bleed - this is likely from an upper source given melena. Will start Protonix IV. Transfuse 2U pRBC. Normal saline infusion. I have asked PCCM to assist in obtaining central access (FFP on hold for central line placement per their request). GI consulted (Dr. Hilarie Fredrickson). Holding Coumadin and Arixtra.  2. CAD - Cardiology consulted prior to transfer. Appreciate their input regarding anticoagulation. Last echo shows an EF of 60% and mild LVH.  3. Anemia of bood loss - CBC Q6 overnight. Keep patient n.p.o. overnight he  Code Status: Full Family Communication: none  Disposition Plan: stepdown for now  Time spent: 44 min  [place], [place]  [facility] Pager [phone] If 7PM-7AM, please contact night-coverage [email or web address] Password [facility] 07/01/2012, 6:14 PM

## 2012-07-01 NOTE — Progress Notes (Signed)
Iv team notified to insert 2nd iv site.

## 2012-07-02 ENCOUNTER — Encounter (HOSPITAL_COMMUNITY): Payer: Self-pay | Admitting: Physician Assistant

## 2012-07-02 DIAGNOSIS — Z954 Presence of other heart-valve replacement: Secondary | ICD-10-CM

## 2012-07-02 DIAGNOSIS — N183 Chronic kidney disease, stage 3 unspecified: Secondary | ICD-10-CM

## 2012-07-02 DIAGNOSIS — N179 Acute kidney failure, unspecified: Secondary | ICD-10-CM

## 2012-07-02 DIAGNOSIS — K922 Gastrointestinal hemorrhage, unspecified: Secondary | ICD-10-CM

## 2012-07-02 DIAGNOSIS — I4892 Unspecified atrial flutter: Secondary | ICD-10-CM

## 2012-07-02 HISTORY — DX: Chronic kidney disease, stage 3 unspecified: N18.30

## 2012-07-02 LAB — PROTIME-INR
INR: 2.55 — ABNORMAL HIGH (ref 0.00–1.49)
Prothrombin Time: 26.2 seconds — ABNORMAL HIGH (ref 11.6–15.2)

## 2012-07-02 LAB — BASIC METABOLIC PANEL
GFR calc Af Amer: 29 mL/min — ABNORMAL LOW (ref >90)
GFR calc non Af Amer: 25 mL/min — ABNORMAL LOW (ref >90)
Glucose, Bld: 103 mg/dL — ABNORMAL HIGH (ref 70–99)
Potassium: 4.6 mEq/L (ref 3.5–5.1)
Sodium: 141 mEq/L (ref 135–145)

## 2012-07-02 LAB — CBC
Platelets: 278 10*3/uL (ref 150–400)
RDW: 15.7 % — ABNORMAL HIGH (ref 11.5–15.5)
WBC: 8.1 10*3/uL (ref 4.0–10.5)

## 2012-07-02 LAB — PREPARE FRESH FROZEN PLASMA: Unit division: 0

## 2012-07-02 MED ORDER — METOPROLOL SUCCINATE ER 100 MG PO TB24
100.0000 mg | ORAL_TABLET | Freq: Every day | ORAL | Status: DC
  Administered 2012-07-02 – 2012-07-06 (×4): 100 mg via ORAL
  Filled 2012-07-02 (×6): qty 1

## 2012-07-02 MED ORDER — CLONIDINE HCL 0.1 MG PO TABS
0.1000 mg | ORAL_TABLET | Freq: Two times a day (BID) | ORAL | Status: DC
  Administered 2012-07-02 – 2012-07-05 (×8): 0.1 mg via ORAL
  Filled 2012-07-02 (×9): qty 1

## 2012-07-02 MED ORDER — SODIUM CHLORIDE 0.9 % IV SOLN
8.0000 mg/h | INTRAVENOUS | Status: DC
  Administered 2012-07-02 – 2012-07-04 (×5): 8 mg/h via INTRAVENOUS
  Filled 2012-07-02 (×9): qty 80

## 2012-07-02 NOTE — Consult Note (Signed)
HPI: 57 yo male for evaluation of dyspnea, recent GI bleed and H/O AVR. PT with history of paroxysmal atrial fibrillation/flutter, history of aortic valve replacement, aortic coarctation, aortic aneurysm, coronary artery disease on CT scan and renal insufficiency. Patient recently had polyps removed (coumadin held and bridged with arixtra). 2 days following procedure, he described black stools (no hematocheezia). Over the weekend, he noticed increasing fatigue and DOE; no chest pain. Seen yesterday and Hgb 7. Admitted and transfused with improved symptoms. Cardiology now asked to evaluate.    Medications Prior to Admission  Medication Sig Dispense Refill  . acetaminophen (TYLENOL) 500 MG tablet Take 500 mg by mouth every 6 (six) hours as needed. For pain      . amLODipine (NORVASC) 10 MG tablet Take 10 mg by mouth daily.       Marland Kitchen b complex vitamins tablet Take 1 tablet by mouth 2 (two) times daily.       . cloNIDine (CATAPRES) 0.1 MG tablet Take 0.1 mg by mouth 2 (two) times daily.       Marland Kitchen COLCRYS 0.6 MG tablet Take 0.6 mg by mouth daily.       . cyclobenzaprine (FLEXERIL) 10 MG tablet Take 5 mg by mouth at bedtime as needed.       . fondaparinux (ARIXTRA) 10 MG/0.8ML SOLN Inject 0.8 mLs (10 mg total) into the skin daily.  6 Syringe  0  . FORTESTA 10 MG/ACT (2%) GEL APPLY 2 PUMPS TO THIGH ONCE DAILY  60 g  3  . furosemide (LASIX) 20 MG tablet Take 20 mg by mouth daily. 4 times weekly. Tues thurs Saturday and Sunday      . HYDROcodone-acetaminophen (VICODIN) 5-500 MG per tablet Take 1 tablet by mouth every 6 (six) hours as needed for pain.  30 tablet  2  . labetalol (NORMODYNE) 200 MG tablet Take 200 mg by mouth 3 (three) times daily.       . metoprolol (TOPROL-XL) 100 MG 24 hr tablet Take 100 mg by mouth daily.       Marland Kitchen olmesartan (BENICAR) 40 MG tablet Take 1 tablet (40 mg total) by mouth daily.  90 tablet  3  . sildenafil (VIAGRA) 50 MG tablet Take 50 mg by mouth daily as needed.        Marland Kitchen  ULORIC 40 MG tablet Take 80 mg by mouth daily.       Marland Kitchen warfarin (COUMADIN) 7.5 MG tablet Take by mouth daily.       Marland Kitchen amoxicillin (AMOXIL) 500 MG capsule Before dental appts.      Marland Kitchen MOVIPREP 100 G SOLR         Allergies  Allergen Reactions  . Ace Inhibitors     Past Medical History  Diagnosis Date  . 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 HTN (hypertension)   . CAD (coronary artery disease)   . Aortic aneurysm   . Gout   . Atrial fibrillation   . Anemia   . Hyperlipidemia   . Atrial flutter   . Obesity   . Anemia of renal disease 10/04/2011  . CKD (chronic kidney disease)     baseline creatinine is 1.5-2    Past Surgical History  Procedure Laterality Date  . Coarctation of aorta repair  1958  . Coarctation of aorta repair  1970  . Aortic valve replacement and mitral valve repair  1974    Bicuspid aortic valve  . Orchiectomy  1992  testicular cancer  . Aortic valve replacement  1995    [facility]  . Tonsillectomy    . Cardioversion  02/27/2012    Procedure: CARDIOVERSION;  Surgeon: Lelon Perla, MD;  Location: [facility] ENDOSCOPY;  Service: Cardiovascular;  Laterality: N/A;    History   Social History  . Marital Status: Married    Spouse Name: N/A    Number of Children: 4  . Years of Education: N/A   Occupational History  . Not on file.   Social History Main Topics  . Smoking status: Former Research scientist (life sciences)  . Smokeless tobacco: Never Used  . Alcohol Use: Yes     Comment: wine, occasionally  . Drug Use: No  . Sexually Active: Not on file   Other Topics Concern  . Not on file   Social History Narrative   Pt lives in [place] with wife.  3 children ages 97,20,22 (all 3 are adopted)   Sales grinding wheels.    Family History  Problem Relation Age of Onset  . Heart disease      No family history    ROS:  no fevers or chills, productive cough, hemoptysis, dysphasia, odynophagia, hematochezia, dysuria, hematuria, rash, seizure activity,  orthopnea, PND, pedal edema, claudication. Remaining systems are negative.  Physical Exam:   Blood pressure 166/54, pulse 76, temperature 98.2 F (36.8 C), temperature source Oral, resp. rate 20, height 6\' 2"  (1.88 m), weight 306 lb 3.5 oz (138.9 kg), SpO2 98.00%.  General:  Well developed/well nourished in NAD Skin warm/dry Patient not depressed No peripheral clubbing Back-normal HEENT-normal/normal eyelids Neck supple/normal carotid upstroke bilaterally; no bruits; no JVD; no thyromegaly chest - CTA/ normal expansion CV - RRR/normal S1 and S2; no rubs or gallops;  PMI nondisplaced, 3/6 systolic murmur Abdomen -NT/ND, no HSM, no mass, + bowel sounds, no bruit 2+ femoral pulses, no bruits Ext-no chords, 2+ DP, trace edema Neuro-grossly nonfocal   Results for orders placed during the hospital encounter of 07/01/12 (from the past 48 hour(s))  OCCULT BLOOD X 1 CARD TO LAB, STOOL     Status: Abnormal   Collection Time    07/01/12 12:35 PM      Result Value Range   Fecal Occult Bld POSITIVE (*) NEGATIVE  PROTIME-INR     Status: Abnormal   Collection Time    07/01/12 12:45 PM      Result Value Range   Prothrombin Time 26.6 (*) 11.6 - 15.2 seconds   INR 2.60 (*) 0.00 - 1.49  MRSA PCR SCREENING     Status: None   Collection Time    07/01/12  6:34 PM      Result Value Range   MRSA by PCR NEGATIVE  NEGATIVE   Comment:            The GeneXpert MRSA Assay (FDA     approved for NASAL specimens     only), is one component of a     comprehensive MRSA colonization     surveillance program. It is not     intended to diagnose MRSA     infection nor to guide or     monitor treatment for     MRSA infections.  TYPE AND SCREEN     Status: None   Collection Time    07/01/12  6:40 PM      Result Value Range   ABO/RH(D) O POS     Antibody Screen NEG     Sample Expiration  07/04/2012     Unit Number [id number]     Blood Component Type RED CELLS,LR     Unit division 00     Status of  Unit ISSUED,FINAL     Transfusion Status OK TO TRANSFUSE     Crossmatch Result Compatible     Unit Number [id number]     Blood Component Type RED CELLS,LR     Unit division 00     Status of Unit ISSUED     Transfusion Status OK TO TRANSFUSE     Crossmatch Result Compatible    PREPARE RBC (CROSSMATCH)     Status: None   Collection Time    07/01/12  6:40 PM      Result Value Range   Order Confirmation ORDER PROCESSED BY BLOOD BANK    BASIC METABOLIC PANEL     Status: Abnormal   Collection Time    07/01/12  6:40 PM      Result Value Range   Sodium 136  135 - 145 mEq/L   Potassium 5.4 (*) 3.5 - 5.1 mEq/L   [place] 106  96 - 112 mEq/L   CO2 18 (*) 19 - 32 mEq/L   Glucose, Bld 110 (*) 70 - 99 mg/dL   BUN 77 (*) 6 - 23 mg/dL   Creatinine, Ser 3.20 (*) 0.50 - 1.35 mg/dL   Calcium 8.8  8.4 - 10.5 mg/dL   GFR calc non Af Amer 20 (*) >90 mL/min   GFR calc Af Amer 24 (*) >90 mL/min   Comment:            The eGFR has been calculated     using the CKD EPI equation.     This calculation has not been     validated in all clinical     situations.     eGFR's persistently     <90 mL/min signify     possible Chronic Kidney Disease.  ABO/RH     Status: None   Collection Time    07/01/12  6:40 PM      Result Value Range   ABO/RH(D) O POS    PREPARE FRESH FROZEN PLASMA     Status: None   Collection Time    07/01/12  6:44 PM      Result Value Range   Unit Number [id number]     Blood Component Type THAWED PLASMA     Unit division 00     Status of Unit ISS'D [facility] PT     Transfusion Status OK TO TRANSFUSE     Unit Number [id number]     Blood Component Type THAWED PLASMA     Unit division 00     Status of Unit ALLOCATED     Transfusion Status OK TO TRANSFUSE    CBC     Status: Abnormal   Collection Time    07/01/12  8:55 PM      Result Value Range   WBC 10.1  4.0 - 10.5 K/uL   RBC 2.51 (*) 4.22 - 5.81 MIL/uL   Hemoglobin 7.1 (*) 13.0 - 17.0 g/dL   HCT 20.8 (*) 39.0 -  52.0 %   MCV 82.9  78.0 - 100.0 fL   MCH 28.3  26.0 - 34.0 pg   MCHC 34.1  30.0 - 36.0 g/dL   RDW 16.0 (*) 11.5 - 15.5 %   Platelets 267  150 - 400 K/uL    Ct Head Wo Contrast  07/01/2012  *RADIOLOGY REPORT*  Clinical Data:  Left periorbital erythema.  CT HEAD AND ORBITS WITHOUT CONTRAST  Technique:  Contiguous axial images were obtained from the base of the skull through the vertex without contrast. Multidetector CT imaging of the orbits was performed using the standard protocol without intravenous contrast.  Comparison:   None.  CT HEAD  Findings: The brain demonstrates no evidence of hemorrhage, infarction, edema, mass effect, extra-axial fluid collection, hydrocephalus or mass lesion.  The skull is unremarkable.  Mucosal thickening is present in the right maxillary antrum.  IMPRESSION: Unremarkable head CT.  The right maxillary mucosal sinus disease.  CT ORBITS  Findings: The orbits show a normal appearance bilaterally with no asymmetry noted.  No soft tissue swelling, hemorrhage or abscess is identified.  No evidence of intraorbital abnormality.  The globes are intact.  Extraocular muscles are symmetric.  No evidence of fracture.  Sinus disease present in both maxillary antra with prominent mucosal thickening in the right maxillary antrum and a small retention cyst inferiorly in the left maxillary antrum.  Other paranasal sinuses are normally aerated.  IMPRESSION: Unremarkable orbital CT.  Maxillary sinus disease present with mucosal thickening in the right maxillary antrum and a mucous retention cyst in the left maxillary antrum.   Original Report Authenticated By: Aletta Edouard, M.D.    Ct Orbitss W/o Cm  07/01/2012  *RADIOLOGY REPORT*  Clinical Data:  Left periorbital erythema.  CT HEAD AND ORBITS WITHOUT CONTRAST  Technique:  Contiguous axial images were obtained from the base of the skull through the vertex without contrast. Multidetector CT imaging of the orbits was performed using the standard  protocol without intravenous contrast.  Comparison:   None.  CT HEAD  Findings: The brain demonstrates no evidence of hemorrhage, infarction, edema, mass effect, extra-axial fluid collection, hydrocephalus or mass lesion.  The skull is unremarkable.  Mucosal thickening is present in the right maxillary antrum.  IMPRESSION: Unremarkable head CT.  The right maxillary mucosal sinus disease.  CT ORBITS  Findings: The orbits show a normal appearance bilaterally with no asymmetry noted.  No soft tissue swelling, hemorrhage or abscess is identified.  No evidence of intraorbital abnormality.  The globes are intact.  Extraocular muscles are symmetric.  No evidence of fracture.  Sinus disease present in both maxillary antra with prominent mucosal thickening in the right maxillary antrum and a small retention cyst inferiorly in the left maxillary antrum.  Other paranasal sinuses are normally aerated.  IMPRESSION: Unremarkable orbital CT.  Maxillary sinus disease present with mucosal thickening in the right maxillary antrum and a mucous retention cyst in the left maxillary antrum.   Original Report Authenticated By: Aletta Edouard, M.D.     Assessment/Plan 1 GI bleed - patient with GI bleed in setting of recent polypectomy; also treated with arixtra and coumadin. Would hold coumadin and agree with [place]. Agree with protonix and transfusion. GI evaluation pending. Follow H/H. As long as patient stable and Hgb improves, would not give FFP due to increased risk of stent thrombosis. Would plan to resume coumadin when Hgb stable. 2 S/P AVR - As above 3 Acute on chronic renal failure - most likely related to acute anemia; hold diuretics and ARB for now. 4 Continue remaining BP meds and follow. 5 H/o atrial flutter s/p DCCV - continue beta blocker; resume coumadin when Hgb stable. 6 Thoracic aneurysm 7 Coarctation 8 Anemia - h/o chronic anemia from renal insufficiency with acute component related to GI bleed; follow  Hgb.  Kirk Ruths MD  07/02/2012, 10:44 AM

## 2012-07-02 NOTE — Consult Note (Signed)
[facility] Consult: 1:06 PM 07/02/2012   Referring Provider: Dr Cruzita Lederer  Primary Care Physician:  Calvin [name], Calvin Riser, MD Primary Gastroenterologist:  Dr Shana Chute in [facility].  [phone]   Reason for Consultation:  GI bleed in pt on Arixtra and Coumadin.   HPI: Calvin [name] is a 57 y.o. male.  Takes Coumadin for hx of AV replacement (1974 bicuspid to tricuspid plasty and 1995 replacement)  S/p repair coarctation of Aorta in 1958 and 1970. Hx A fib, cardioversion in 01/2012. On chronic Coumadin. Has CKD associated anemia managed with monthly CBC and prn Aranesp by Dr Marin Olp.    Had colonoscopy and EGD (2007 benign gastric polyp)for polyp surveillance and family hx colon cancer on 06/17/12.  He was Heme negative up to that time. He held Coumadin for 5 days preop, restarted this the day after colonoscopy at his usual dose. Pre and post op continued on Arixtra bridge. At subsequent INR checks, he was subtherapeutic and Arixtra along with his preop doses of Coumadin were continued.  By yesterday he had been on Arixtra/Coumadin since just after colonoscopy. Dr Shana Chute had suggested pursuing capsule endo due to anemia but pt has never been heme positive, this was not suggested by Dr Marin Olp who is treating his chronic/renal disease anemia.  Pt declines capsule endoscopy.   One polyp  removed from stomach. Antral erosions noted.  He was given Rx for Pantoprazole 40 mg daily (not on med list).   Within 48 hours the stool turned consistently black in color, formed as well as loose.  Also pt had marked sense of bloating and initially unable to pass flatus.  Hgb at rheumatologist on 2/14: 8.7, 2/28 at hematologist: 7.1.  Compared with 10.9 on 1/28.  He received Aranesp on 1/28 and 2/28. Since 2/28 pt has still had worsening fatigue and increasing somnolence. No chest pain or dyspnea.  Advised to go to ED 3/3 where Hgb was still 7.1.   Today is the first  time since 2/19 that his stool is brown.  Stool was FOB positive yesterday. He is hungry.  No nausea, no dysphagia, no heartburn.  Has received 2 PRBCs, no FFP.    ENDOSCOPIC STUDIES: 2/17 EGD HH, antral erosions, friable gastric polyp removed with cold biopsy  06/17/12 Colonoscopy Suboptimal prep.  Scattered pan diverticulosis.  ? Short segment Barrett's.  Pathology: No villous atrophy.  No H pylori. No Barrett's.  Mild chronic gastric inflammation, Unremarkable SB biopsy.   Past Medical History  Diagnosis Date  . 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 HTN (hypertension)   . CAD (coronary artery disease)   . Aortic aneurysm   . Gout   . Atrial fibrillation   . Anemia   . Hyperlipidemia   . Atrial flutter   . Obesity   . Anemia of renal disease 10/04/2011  . CKD (chronic kidney disease)     baseline creatinine is 1.5-2  . Gastric polyp 2007, 06/2012    benign.  . Colonic ulcer 2007    felt to be secondary to NSAID    Past Surgical History  Procedure Laterality Date  . Coarctation of aorta repair  1958  . Coarctation of aorta repair  1970  . Aortic valve replacement and mitral valve repair  1974    Bicuspid aortic valve  . Orchiectomy  1992    testicular cancer  . Aortic valve replacement  1995    [facility]  . Tonsillectomy    .  Cardioversion  02/27/2012    Procedure: CARDIOVERSION;  Surgeon: Lelon Perla, MD;  Location: [facility] ENDOSCOPY;  Service: Cardiovascular;  Laterality: N/A;    Prior to Admission medications   Medication Sig Start Date End Date Taking? Authorizing Provider  acetaminophen (TYLENOL) 500 MG tablet Take 500 mg by mouth every 6 (six) hours as needed. For pain   Yes Historical Provider, MD  amLODipine (NORVASC) 10 MG tablet Take 10 mg by mouth daily.  01/08/11  Yes Historical Provider, MD  b complex vitamins tablet Take 1 tablet by mouth 2 (two) times daily.    Yes Historical Provider, MD  cloNIDine (CATAPRES) 0.1 MG tablet Take 0.1 mg  by mouth 2 (two) times daily.  01/08/11  Yes Historical Provider, MD  COLCRYS 0.6 MG tablet Take 0.6 mg by mouth daily.  04/10/12  Yes Historical Provider, MD  cyclobenzaprine (FLEXERIL) 10 MG tablet Take 5 mg by mouth at bedtime as needed.    Yes Historical Provider, MD  fondaparinux (ARIXTRA) 10 MG/0.8ML SOLN Inject 0.8 mLs (10 mg total) into the skin daily. 06/28/12  Yes Volanda Napoleon, MD  FORTESTA 10 MG/ACT (2%) GEL APPLY 2 PUMPS TO THIGH ONCE DAILY 03/25/12  Yes Volanda Napoleon, MD  furosemide (LASIX) 20 MG tablet Take 20 mg by mouth daily. 4 times weekly. Tues thurs Saturday and Sunday   Yes Historical Provider, MD  HYDROcodone-acetaminophen (VICODIN) 5-500 MG per tablet Take 1 tablet by mouth every 6 (six) hours as needed for pain. 11/23/11  Yes Burnice Logan, MD  labetalol (NORMODYNE) 200 MG tablet Take 200 mg by mouth 3 (three) times daily.  01/08/11  Yes Historical Provider, MD  metoprolol (TOPROL-XL) 100 MG 24 hr tablet Take 100 mg by mouth daily.  01/08/11  Yes Historical Provider, MD  olmesartan (BENICAR) 40 MG tablet Take 1 tablet (40 mg total) by mouth daily. 08/18/11 08/17/12 Yes Lelon Perla, MD  sildenafil (VIAGRA) 50 MG tablet Take 50 mg by mouth daily as needed.     Yes Historical Provider, MD  ULORIC 40 MG tablet Take 80 mg by mouth daily.  04/10/12  Yes Historical Provider, MD  warfarin (COUMADIN) 7.5 MG tablet Take by mouth daily.  01/08/11  Yes Historical Provider, MD  amoxicillin (AMOXIL) 500 MG capsule Before dental appts. 09/13/11   Historical Provider, MD  MOVIPREP 100 G SOLR  03/26/12   Historical Provider, MD  Pantoprazole  40 mg               Once daily  Scheduled Meds: . cloNIDine  0.1 mg Oral BID  . metoprolol succinate  100 mg Oral Daily  . sodium chloride  1,000 mL Intravenous Once  . sodium chloride  3 mL Intravenous Q12H   Infusions: . pantoprozole (PROTONIX) infusion 8 mg/hr (07/02/12 0700)   PRN Meds: acetaminophen   Allergies as of 07/01/2012 - Review  Complete 07/01/2012  Allergen Reaction Noted  . Ace inhibitors  04/04/2011    Family History  Problem Relation Age of Onset  . Heart disease      No family history        Anal and colon cancer in both parents in their 65s.  History   Social History  . Marital Status: Married    Spouse Name: N/A    Number of Children: 4  . Years of Education: N/A   Occupational History  .  Sales: sells industrial grinding wheels.     Social History Main Topics  .  Smoking status: Former Research scientist (life sciences)  . Smokeless tobacco: Never Used  . Alcohol Use: Yes     Comment: wine, occasionally  . Drug Use: No  . Sexually Active: Not on file   Other Topics Concern  . Not on file   Social History Narrative   Pt lives in [place] with wife.  3 children ages 68,20,22 (all 3 are adopted)       REVIEW OF SYSTEMS: Constitutional:  Weak, tired.  No weight loss ENT:  No nose bleed.  Subconjunctival hemorrhage erupted 4 days ago Pulm:  No cough, no dyspnea, no chest pain CV:  No palpitations.  No extremity edema GU:  No blood in urine GI:  As per HPI Heme:  Chronic Iron as per HPI.    Transfusions:  At times of OHS in past.  Bruises at site of Arixtra injection.  Neuro:  No headaches, no syncoper Derm:  No rash, sores Endocrine:  No excessive thirst or urination Immunization:  Current on flu and pneumovax.  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  No TMG Lungs:  Clear.  Long, old/healed scar on lateral left thorax Heart: sternotomy scar, regular rate, mechanical click Abdomen:  Soft, active BS, moderately obese, soft, NT.  No mass or HSM.   Rectal: deferred, FOB + in ED yesterday.    Musc/Skeltl: no joint swelling or deformity.  Extremities:  No pedal edema  Neurologic:  Pleasant, oriented x 3.  No tremor or gross deficits Skin:  No rash, no telangectasia Tattoos:  none Nodes:  No inguinal adenopathy.   Psych:  Pleasant, not anxious or depressed.   Intake/Output from previous day: 03/03 0701 - 03/04 0700 In: 2325 [I.V.:625; Blood:700; IV Piggyback:1000] Out: 320 [Urine:320] Intake/Output this shift: Total I/O In: -  Out: 651 [Urine:650; Stool:1]  LAB RESULTS:  Recent Labs  07/01/12 1115 07/01/12 2055  WBC 9.0 10.1  HGB 7.0* 7.1*  HCT 21.8* 20.8*  PLT 302 267   BMET Lab Results  Component Value Date   NA 141 07/02/2012   NA 136 07/01/2012   NA 135 05/31/2012   K 4.6 07/02/2012   K 5.4* 07/01/2012   K 5.0 05/31/2012   CL 107 07/02/2012   CL 106 07/01/2012   CL 103 05/31/2012   CO2 21 07/02/2012   CO2 18* 07/01/2012   CO2 24 05/31/2012   GLUCOSE 103* 07/02/2012   GLUCOSE 110* 07/01/2012   GLUCOSE 104* 05/31/2012   BUN 66* 07/02/2012   BUN 77* 07/01/2012   BUN 42* 05/31/2012   CREATININE 2.67* 07/02/2012   CREATININE 3.20* 07/01/2012   CREATININE 1.91* 05/31/2012   CALCIUM 9.5 07/02/2012   CALCIUM 8.8 07/01/2012   CALCIUM 9.4 05/31/2012   LFT No results found for this basename: PROT, ALBUMIN, AST, ALT,  ALKPHOS, BILITOT, BILIDIR, IBILI,  in the last 72 hours PT/INR Lab Results  Component Value Date   INR 2.55* 07/02/2012   INR 2.60* 07/01/2012   INR 1.8* 06/28/2012   PROTIME 21.6* 06/28/2012   PROTIME 16.8* 06/21/2012   PROTIME 26.4* 05/31/2012     RADIOLOGY STUDIES: Ct Orbitss W/o Cm 07/01/2012    IMPRESSION: Unremarkable head CT.  The right maxillary mucosal sinus disease.  CT  ORBITS  Findings: The orbits show a normal appearance bilaterally with no asymmetry noted.  No soft tissue swelling, hemorrhage or abscess is identified.  No evidence of intraorbital abnormality.  The globes are intact.  Extraocular muscles are symmetric.  No evidence of fracture.  Sinus disease present in both maxillary antra with prominent mucosal thickening in the right maxillary antrum and a small retention cyst inferiorly in the left maxillary antrum.  Other paranasal sinuses are normally aerated.  IMPRESSION: Unremarkable orbital CT.  Maxillary sinus disease present with mucosal thickening in the right maxillary antrum and a mucous retention cyst in the left maxillary antrum.   Original Report Authenticated By: Aletta Edouard, M.D.      IMPRESSION: *  GI bleed in pt on Coumadin/Arixtra. Polyp removed from stomach 2/17 suspect this is source of bleeding.  Stool today is brown for first time since colon/EGD.  *  ABL on top of anemia of chronic dz/renal failure.  S/p 2 PRBCs.  Doses of Aranesp 05/28/12 and 06/28/12.  *  S/P [employer] Jude AVR:  Reason for Coumadin *  Hx testicular cancer, s/p orchiectomy.    PLAN: *  Hold Arixtra as doing. *  Will allow clears, plan EGD tomorrow 9:30 AM if INR close to 1.5. . *  Continue Protonix drip for now.  Serial CBCs and recheck PT/INR in AM as well as today.     LOS: 1 day   Azucena Freed  07/02/2012, 1:06 PM Pager: [phone]  Chart was reviewed and patient was examined. X-rays and lab were reviewed.    I agree with management and plans. Suspect GI bleeding related to gastric polypectomy.  He appears to have stopped bleeding.  I think it is important to pursue upper endoscopy to identify bleeding source and assess the risk for rebleeding after restarting anticoagulants.  Sandy Salaam. Deatra Ina, M.D., [facility] Cell [phone]

## 2012-07-02 NOTE — Progress Notes (Addendum)
[facility] Progress Note [place] TEAM 1 - Stepdown/ICU TEAM   Calvin [name] [id number] DOB: January 11, 1956 DOA: 07/01/2012 PCP: Jeb Levering, Philbert Riser, MD  Brief narrative: 56 year old male patient on chronic anticoagulation due to mechanical aortic valve. He also has chronic kidney disease and anemia related to this and is followed by Dr. Lutricia Feil who manages his Aranesp. He recently underwent endoscopic evaluation about 2 weeks ago at which time antral erosions were found and a polyp was biopsied from the stomach. Within 48 hours he developed dark stools associated with abdominal bloating and difficulty passing flatus. At followup visit with his hematologist his hemoglobin had decreased to 7.1. He was also developing progressive fatigue and somnolence. He presented to the emergency department where stools were heme-positive and his hemoglobin was 7.1. Of note his Coumadin had been held 5 days before the above-mentioned procedure and was on Arixtra bridge until INR became therapeutic. At presentation to the emergency department his INR was therapeutic at 2.6.  Assessment/Plan: Active Problems:   GI bleed -Seems to have stopped at this point with the patient now reporting brown stool -Appreciate GI assistance -Plan is for EGD 3/5 if can get INR </= 1.5 (cards recs NOT to reverse INRwith FFP since increased risk of stent thrombosis    Acute blood loss anemia and chronic anemia 2/2 CKD -post 2 units PRBC's since admit- repeat hgb and cycle every 12 hours -Last Aranesp 2/28     Acute renal failure on CKD (chronic kidney disease) stage 3, GFR 30-59 ml/min -Scr at admit 3.2 and now down to 2.67 after hydration/PRBC's- baseline 1.67 -follow lytes    S/P St. Jude aortic valve replacement/Coarctation of aorta (previous repair and stent) -OK per cards to hold Coumadin/Arixtra but avoid pharmacological/FFP reversal (incr. Risk stent thrombosis)    Thoracic aortic aneurysm    History of  atrial flutter s/p DCCV  -Maintaining sinus rhythm -cont B blocker and resume coumadin when hgb stable    Hypertension -cont some of the home meds watching closely for anemia mediated hypotension    CAD (coronary artery disease) - no sx's even with anemia    Hyperlipidemia    Hypogonadism male   DVT prophylaxis: SCDs Code Status: Full Family Communication: Patient Disposition Plan: Stepdown  Consultants: Gastroenterology Cardiology  Procedures: EGD (3/35/14)  Antibiotics: None  HPI/Subjective: Patient alert and denies abdominal pain, chest pain or shortness of breath. I have had a detailed conversation with patient and wife. His wife is asking why many of his cardiac meds have been held. I've explained that Lasix and Benicar are on hold due to acute renal failure and that we are resuming clonidine today as his blood pressure is noted to be elevated. She has numerous questions in regards to bleeding and EGD which have been answered.   Objective: Blood pressure 157/91, pulse 80, temperature 98.8 F (37.1 C), temperature source Oral, resp. rate 20, height 6\' 2"  (1.88 m), weight 138.9 kg (306 lb 3.5 oz), SpO2 92.00%.  Intake/Output Summary (Last 24 hours) at 07/02/12 1433 Last data filed at 07/02/12 0900  Gross per 24 hour  Intake   2325 ml  Output    971 ml  Net   1354 ml     Exam: General: No acute respiratory distress Lungs: Clear to auscultation bilaterally without wheezes or crackles Cardiovascular: Regular rate and rhythm without murmur gallop or rub normal S1 and S2 Abdomen: Nontender, nondistended, soft, bowel sounds positive, no rebound, no ascites, no appreciable mass Extremities:  No significant cyanosis, clubbing, or edema bilateral lower extremities  Data Reviewed: Basic Metabolic Panel:  Recent Labs Lab 07/01/12 1840 07/02/12 1014  NA 136 141  K 5.4* 4.6  CL 106 107  CO2 18* 21  GLUCOSE 110* 103*  BUN 77* 66*  CREATININE 3.20* 2.67*   CALCIUM 8.8 9.5   Liver Function Tests: No results found for this basename: AST, ALT, ALKPHOS, BILITOT, PROT, ALBUMIN,  in the last 168 hours No results found for this basename: LIPASE, AMYLASE,  in the last 168 hours No results found for this basename: AMMONIA,  in the last 168 hours CBC:  Recent Labs Lab 06/28/12 1052 07/01/12 1115 07/01/12 2055  WBC 6.1 9.0 10.1  NEUTROABS 4.3 7.6*  --   HGB 7.1* 7.0* 7.1*  HCT 21.8* 21.8* 20.8*  MCV 85 87 82.9  PLT 318 302 267   Cardiac Enzymes: No results found for this basename: CKTOTAL, CKMB, CKMBINDEX, TROPONINI,  in the last 168 hours BNP (last 3 results) No results found for this basename: PROBNP,  in the last 8760 hours CBG: No results found for this basename: GLUCAP,  in the last 168 hours  Recent Results (from the past 240 hour(s))  MRSA PCR SCREENING     Status: None   Collection Time    07/01/12  6:34 PM      Result Value Range Status   MRSA by PCR NEGATIVE  NEGATIVE Final   Comment:            The GeneXpert MRSA Assay (FDA     approved for NASAL specimens     only), is one component of a     comprehensive MRSA colonization     surveillance program. It is not     intended to diagnose MRSA     infection nor to guide or     monitor treatment for     MRSA infections.     Studies:  Recent x-ray studies have been reviewed in detail by the Attending Physician  Scheduled Meds:  Reviewed in detail by the Attending Physician   Erin Hearing, [place] [facility] Office  [phone] Pager [phone]  On-Call/Text Page:      [email or web address]      password [email or web address]  If 7PM-7AM, please contact night-coverage [email or web address] Password [facility] 07/02/2012, 2:33 PM   LOS: 1 day   I have examined the patient, reviewed the chart and modified the above note which I agree with.   Jadelyn Elks,MD [id number] 07/02/2012, 6:14 PM

## 2012-07-02 NOTE — ED Provider Notes (Signed)
57 y.o. Male with anticoagulation for valve presents today with anemia and rectal bleeding after colonoscopy two weeks ago.  He remains hemodynamically stable and consideration was given to reversal of anticoagulation.  However, given that bleeding has apparently been slow and patient is hemodynamically stable, we will await consultation with patients cardiologist as inpatient.    Abdomen soft and nontender  Stool guiac positive.  CRITICAL CARE Performed by: Shaune Pollack   Total critical care time: 35  Critical care time was exclusive of separately billable procedures and treating other patients.  Critical care was necessary to treat or prevent imminent or life-threatening deterioration.  Critical care was time spent personally by me on the following activities: development of treatment plan with patient and/or surrogate as well as nursing, discussions with consultants, evaluation of patient's response to treatment, examination of patient, obtaining history from patient or surrogate, ordering and performing treatments and interventions, ordering and review of laboratory studies, ordering and review of radiographic studies, pulse oximetry and re-evaluation of patient's condition.   Shaune Pollack, MD 07/02/12 [phone]

## 2012-07-03 DIAGNOSIS — M109 Gout, unspecified: Secondary | ICD-10-CM | POA: Diagnosis present

## 2012-07-03 DIAGNOSIS — R600 Localized edema: Secondary | ICD-10-CM | POA: Diagnosis present

## 2012-07-03 DIAGNOSIS — K2970 Gastritis, unspecified, without bleeding: Secondary | ICD-10-CM | POA: Diagnosis present

## 2012-07-03 DIAGNOSIS — R609 Edema, unspecified: Secondary | ICD-10-CM

## 2012-07-03 HISTORY — DX: Localized edema: R60.0

## 2012-07-03 HISTORY — DX: Gout, unspecified: M10.9

## 2012-07-03 LAB — CBC
HCT: 26.1 % — ABNORMAL LOW (ref 39.0–52.0)
MCV: 81.8 fL (ref 78.0–100.0)
Platelets: 293 10*3/uL (ref 150–400)
RBC: 3.19 MIL/uL — ABNORMAL LOW (ref 4.22–5.81)
WBC: 6.9 10*3/uL (ref 4.0–10.5)

## 2012-07-03 LAB — TYPE AND SCREEN
Antibody Screen: NEGATIVE
Unit division: 0

## 2012-07-03 LAB — PROTIME-INR: INR: 2.26 — ABNORMAL HIGH (ref 0.00–1.49)

## 2012-07-03 MED ORDER — AMLODIPINE BESYLATE 10 MG PO TABS
10.0000 mg | ORAL_TABLET | Freq: Every day | ORAL | Status: DC
  Administered 2012-07-03 – 2012-07-06 (×4): 10 mg via ORAL
  Filled 2012-07-03 (×6): qty 1

## 2012-07-03 MED ORDER — FUROSEMIDE 10 MG/ML IJ SOLN
40.0000 mg | Freq: Once | INTRAMUSCULAR | Status: AC
  Administered 2012-07-03: 40 mg via INTRAVENOUS
  Filled 2012-07-03: qty 4

## 2012-07-03 MED ORDER — COLCHICINE 0.6 MG PO TABS
0.6000 mg | ORAL_TABLET | Freq: Every day | ORAL | Status: DC
  Administered 2012-07-04 – 2012-07-06 (×3): 0.6 mg via ORAL
  Filled 2012-07-03 (×5): qty 1

## 2012-07-03 MED ORDER — B COMPLEX PO TABS: 1.0000 | ORAL_TABLET | Freq: Two times a day (BID) | ORAL | Status: DC

## 2012-07-03 MED ORDER — B COMPLEX-C PO TABS
1.0000 | ORAL_TABLET | Freq: Every day | ORAL | Status: DC
  Administered 2012-07-03 – 2012-07-07 (×4): 1 via ORAL
  Filled 2012-07-03 (×5): qty 1

## 2012-07-03 MED ORDER — FEBUXOSTAT 40 MG PO TABS
80.0000 mg | ORAL_TABLET | Freq: Every day | ORAL | Status: DC
  Administered 2012-07-03 – 2012-07-07 (×5): 80 mg via ORAL
  Filled 2012-07-03 (×6): qty 2

## 2012-07-03 NOTE — Progress Notes (Signed)
     [facility] Daily Rounding Note 07/03/2012, 8:31 AM  SUBJECTIVE:       Stools brown, no nausea.  No abd pain   OBJECTIVE:         Vital signs in last 24 hours:    Temp:  [98 F (36.7 C)-98.8 F (37.1 C)] 98.7 F (37.1 C) (03/05 0332) Pulse Rate:  [68-95] 95 (03/05 0332) Resp:  [19-29] 29 (03/05 0332) BP: (128-157)/(47-91) 140/51 mmHg (03/05 0332) SpO2:  [91 %-98 %] 91 % (03/05 0332) Last BM Date: 07/02/12 General: looks well   Heart: RRR Chest: clear B. Abdomen: soft, NT, ND, active sounds  Extremities: no edema Neuro/Psych:  Pleasant, not confused or anxious   Intake/Output from previous day: 03/04 0701 - 03/05 0700 In: -  Out: 1972 [Urine:1970; Stool:2]  Intake/Output this shift:    Lab Results:  Recent Labs  07/01/12 2055 07/02/12 1707  WBC 10.1 8.1  HGB 7.1* 8.3*  HCT 20.8* 24.2*  PLT 267 278   BMET  Recent Labs  07/01/12 1840 07/02/12 1014  NA 136 141  K 5.4* 4.6  CL 106 107  CO2 18* 21  GLUCOSE 110* 103*  BUN 77* 66*  CREATININE 3.20* 2.67*  CALCIUM 8.8 9.5     PT/INR  Recent Labs  07/01/12 1245 07/02/12 1014  LABPROT 26.6* 26.2*  INR 2.60* 2.55*  INR     2.2 at  930 this AM.                            ASSESMENT: * GI bleed in pt on Coumadin/Arixtra. Polyp removed from stomach 2/17 suspect this is source of bleeding. Stool today is brown for first time since colon/EGD.  * ABL on top of anemia of chronic dz/renal failure. S/p 2 PRBCs. Hgb is up 1.2 grams.  Doses of Aranesp 05/28/12 and 06/28/12.  * S/P St Jude AVR: Reason for Coumadin.    *  Chronic kidney disease.  Creatinine improved.     PLAN: *  Repeat INR is 2.2:.  Defer EGD to tomorrow.    LOS: 2 days   Azucena Freed  07/03/2012, 8:31 AM Pager: [phone]  I have personally taken an interval history, reviewed the chart, and examined the patient.  I agree with the extender's note, impression and recommendations.  Sandy Salaam. Deatra Ina, MD, [facility] [phone]

## 2012-07-03 NOTE — Progress Notes (Signed)
   Subjective:  Denies CP or dyspnea   Objective:  Filed Vitals:   07/02/12 2027 07/02/12 2133 07/02/12 2328 07/03/12 0332  BP: 139/49 128/47 151/59 140/51  Pulse: 73  68 95  Temp:   98 F (36.7 C) 98.7 F (37.1 C)  TempSrc:   Oral Oral  Resp: 25  19 29   Height:      Weight:      SpO2:   98% 91%    Intake/Output from previous day:  Intake/Output Summary (Last 24 hours) at 07/03/12 0734 Last data filed at 07/03/12 0600  Gross per 24 hour  Intake      0 ml  Output   1972 ml  Net  -1972 ml    Physical Exam: Physical exam: Well-developed well-nourished in no acute distress.  Skin is warm and dry.  HEENT is normal.  Neck is supple.  Chest with mildly diminished BS bases Cardiovascular exam is regular rate and rhythm. 2/6 systolic murmur Abdominal exam nontender or distended. No masses palpated. Extremities show trace edema. neuro grossly intact    Lab Results: Basic Metabolic Panel:  Recent Labs  07/01/12 1840 07/02/12 1014  NA 136 141  K 5.4* 4.6  CL 106 107  CO2 18* 21  GLUCOSE 110* 103*  BUN 77* 66*  CREATININE 3.20* 2.67*  CALCIUM 8.8 9.5   CBC:  Recent Labs  07/01/12 1115 07/01/12 2055 07/02/12 1707  WBC 9.0 10.1 8.1  NEUTROABS 7.6*  --   --   HGB 7.0* 7.1* 8.3*  HCT 21.8* 20.8* 24.2*  MCV 87 82.9 82.0  PLT 302 267 278     Assessment/Plan:  1 GI bleed-patient states his stools are now brown. He does not appear to be actively bleeding. Arixtra has been discontinued. Coumadin is on hold. Given that he is not actively bleeding I would not reverse his Coumadin with vitamin K or FFP due to the risk of causing hypercoagulability.  Continue to let INR drift down. Resume Coumadin when okay with GI. Upper endoscopy is scheduled as INR improves. Follow hemoglobin. Continue Protonix. 2 acute on chronic stage III renal failure-continue to hold diuretic and ARB. BUN and creatinine improving. Some of elevation in BUN is likely related to GI bleed. 3  status post aortic valve replacement-resume Coumadin when okay with GI. 4 hypertension continue remaining medications. 5 history of atrial flutter status post cardioversion-patient remains in sinus rhythm. Coumadin on hold.  Kirk Ruths 07/03/2012, 7:34 AM

## 2012-07-03 NOTE — Progress Notes (Signed)
[facility] Progress Note [place] TEAM 1 - Stepdown/ICU TEAM   Calvin [name] [id number] DOB: 1955/10/10 DOA: 07/01/2012 PCP: Jeb Levering, Philbert Riser, MD  Brief narrative: 57 year old male patient on chronic anticoagulation due to mechanical aortic valve. He also has chronic kidney disease and anemia related to this and is followed by Dr. Marin Olp who manages his Aranesp. He recently underwent endoscopic evaluation about 2 weeks ago at which time antral erosions were found and a polyp was biopsied from the stomach. Within 48 hours he developed dark stools associated with abdominal bloating and difficulty passing flatus. At followup visit with his hematologist his hemoglobin had decreased to 7.1. He was also developing progressive fatigue and somnolence. He presented to the emergency department where stools were heme-positive and his hemoglobin was 7.1. Of note his Coumadin had been held 5 days before the above-mentioned procedure and was on Arixtra bridge until INR became therapeutic. At presentation to the emergency department his INR was therapeutic at 2.6.  Assessment/Plan:  GI bleed -Seems to have stopped at this point with the patient now reporting brown stool -Appreciate GI assistance -Plan is for EGD 3/6 if can get INR </= 1.5 (cards recs NOT to reverse INR with FFP since increased risk of stent thrombosis)  Acute blood loss anemia and chronic anemia 2/2 CKD -post 2 units PRBC's since admit- repeat hgb and cycle every 12 hours -Last Aranesp 2/28  Acute renal failure on CKD stage 3, GFR 30-59 ml/min -Scr at admit 3.2 with trend downward (3/4) after hydration/PRBC's- baseline 1.67 - repeat in am after Lasix  S/P St. Jude aortic valve replacement/Coarctation of aorta (previous repair and stent) -OK per cards to hold Coumadin/Arixtra but avoid pharmacological/FFP reversal (incr. Risk stent thrombosis)  LE edema (bilateral) -On low dose Lasix at home (4x weekly) for  edema -since admit and after transfusion and IVF's has developed LE edema - give 1x dose IV Lasix 40 mg  Thoracic aortic aneurysm  History of atrial flutter s/p DCCV  -Maintaining sinus rhythm -cont B blocker and resume coumadin when hgb stable  Hypertension -cont all home meds except for Benicar  CAD (coronary artery disease) - no sx's even with anemia  Gout -resume Uloric and Colcichine  Hyperlipidemia  Hypogonadism male  DVT prophylaxis: SCDs Code Status: Full Family Communication: Patient Disposition Plan: Stepdown  Consultants: Gastroenterology Cardiology  Procedures: EGD (07/04/12) - pending  Antibiotics: None  HPI/Subjective: Patient alert and denies abdominal pain, chest pain or shortness of breath. Primary complaint is of edema in legs. Wife at bedside- questions answered.  Objective: Blood pressure 155/47, pulse 69, temperature 97.5 F (36.4 C), temperature source Oral, resp. rate 16, height 6\' 2"  (1.88 m), weight 138.9 kg (306 lb 3.5 oz), SpO2 98.00%.  Intake/Output Summary (Last 24 hours) at 07/03/12 1121 Last data filed at 07/03/12 0600  Gross per 24 hour  Intake      0 ml  Output   1321 ml  Net  -1321 ml   Exam: General: No acute respiratory distress Lungs: Clear to auscultation bilaterally without wheezes or crackles Cardiovascular: Regular rate and rhythm without murmur gallop or rub normal S1 and S2 Abdomen: Nontender, nondistended, soft, bowel sounds positive, no rebound, no ascites, no appreciable mass Extremities: No significant cyanosis, clubbing, or edema bilateral lower extremities  Data Reviewed: Basic Metabolic Panel:  Recent Labs Lab 07/01/12 1840 07/02/12 1014  NA 136 141  K 5.4* 4.6  CL 106 107  CO2 18* 21  GLUCOSE 110* 103*  BUN 77* 66*  CREATININE 3.20* 2.67*  CALCIUM 8.8 9.5   CBC:  Recent Labs Lab 06/28/12 1052 07/01/12 1115 07/01/12 2055 07/02/12 1707 07/03/12 0920  WBC 6.1 9.0 10.1 8.1 6.9  NEUTROABS  4.3 7.6*  --   --   --   HGB 7.1* 7.0* 7.1* 8.3* 9.0*  HCT 21.8* 21.8* 20.8* 24.2* 26.1*  MCV 85 87 82.9 82.0 81.8  PLT 318 302 267 278 293    Recent Results (from the past 240 hour(s))  MRSA PCR SCREENING     Status: None   Collection Time    07/01/12  6:34 PM      Result Value Range Status   MRSA by PCR NEGATIVE  NEGATIVE Final   Comment:            The GeneXpert MRSA Assay (FDA     approved for NASAL specimens     only), is one component of a     comprehensive MRSA colonization     surveillance program. It is not     intended to diagnose MRSA     infection nor to guide or     monitor treatment for     MRSA infections.     Studies:  Recent x-ray studies have been reviewed in detail by the Attending Physician  Scheduled Meds:  Reviewed in detail by the Attending Physician   Erin Hearing, [place] [facility] Office  [phone] Pager [phone]  On-Call/Text Page:      [email or web address]      password [email or web address]  If 7PM-7AM, please contact night-coverage [email or web address] Password [email or web address] 07/03/2012, 11:21 AM   LOS: 2 days   I have personally examined this patient and reviewed the entire database. I have reviewed the above note, made any necessary editorial changes, and agree with its content.  Cherene Altes, MD [facility]

## 2012-07-04 ENCOUNTER — Encounter (HOSPITAL_COMMUNITY): Payer: Self-pay | Admitting: *Deleted

## 2012-07-04 ENCOUNTER — Encounter (HOSPITAL_COMMUNITY)
Admission: EM | Disposition: A | Discharge status: Home / Self Care | Payer: Self-pay | Source: Home / Self Care | Attending: Internal Medicine

## 2012-07-04 DIAGNOSIS — D509 Iron deficiency anemia, unspecified: Secondary | ICD-10-CM

## 2012-07-04 DIAGNOSIS — K259 Gastric ulcer, unspecified as acute or chronic, without hemorrhage or perforation: Secondary | ICD-10-CM

## 2012-07-04 DIAGNOSIS — D649 Anemia, unspecified: Secondary | ICD-10-CM

## 2012-07-04 HISTORY — PX: ESOPHAGOGASTRODUODENOSCOPY: SHX5428

## 2012-07-04 LAB — BASIC METABOLIC PANEL
GFR calc non Af Amer: 35 mL/min — ABNORMAL LOW (ref >90)
Glucose, Bld: 116 mg/dL — ABNORMAL HIGH (ref 70–99)
Potassium: 4.2 mEq/L (ref 3.5–5.1)
Sodium: 141 mEq/L (ref 135–145)

## 2012-07-04 LAB — PROTIME-INR
INR: 1.83 — ABNORMAL HIGH (ref 0.00–1.49)
Prothrombin Time: 20.5 seconds — ABNORMAL HIGH (ref 11.6–15.2)

## 2012-07-04 LAB — CBC
Hemoglobin: 9.2 g/dL — ABNORMAL LOW (ref 13.0–17.0)
MCH: 28 pg (ref 26.0–34.0)
Platelets: 320 10*3/uL (ref 150–400)
RBC: 3.28 MIL/uL — ABNORMAL LOW (ref 4.22–5.81)
WBC: 7.4 10*3/uL (ref 4.0–10.5)

## 2012-07-04 SURGERY — EGD (ESOPHAGOGASTRODUODENOSCOPY)
Anesthesia: Moderate Sedation

## 2012-07-04 MED ORDER — MIDAZOLAM HCL 5 MG/ML IJ SOLN
INTRAMUSCULAR | Status: AC
  Filled 2012-07-04: qty 2

## 2012-07-04 MED ORDER — SODIUM CHLORIDE 0.9 % IV SOLN
Freq: Once | INTRAVENOUS | Status: AC
  Administered 2012-07-04: 500 mL via INTRAVENOUS

## 2012-07-04 MED ORDER — FUROSEMIDE 20 MG PO TABS
20.0000 mg | ORAL_TABLET | ORAL | Status: DC
  Administered 2012-07-06 – 2012-07-07 (×2): 20 mg via ORAL
  Filled 2012-07-04 (×4): qty 1

## 2012-07-04 MED ORDER — ONDANSETRON HCL 4 MG PO TABS: 4.0000 mg | ORAL_TABLET | Freq: Four times a day (QID) | ORAL | Status: DC | PRN

## 2012-07-04 MED ORDER — IRBESARTAN 75 MG PO TABS
75.0000 mg | ORAL_TABLET | Freq: Every day | ORAL | Status: DC
  Administered 2012-07-05 – 2012-07-07 (×3): 75 mg via ORAL
  Filled 2012-07-04 (×5): qty 1

## 2012-07-04 MED ORDER — BUTAMBEN-TETRACAINE-BENZOCAINE 2-2-14 % EX AERO
INHALATION_SPRAY | CUTANEOUS | Status: DC | PRN
  Administered 2012-07-04: 2 via TOPICAL

## 2012-07-04 MED ORDER — PANTOPRAZOLE SODIUM 40 MG PO TBEC
40.0000 mg | DELAYED_RELEASE_TABLET | Freq: Two times a day (BID) | ORAL | Status: DC
  Administered 2012-07-04 – 2012-07-07 (×7): 40 mg via ORAL
  Filled 2012-07-04 (×7): qty 1

## 2012-07-04 MED ORDER — LABETALOL HCL 200 MG PO TABS
200.0000 mg | ORAL_TABLET | Freq: Three times a day (TID) | ORAL | Status: DC
  Filled 2012-07-04 (×2): qty 1

## 2012-07-04 MED ORDER — MIDAZOLAM HCL 5 MG/5ML IJ SOLN
INTRAMUSCULAR | Status: DC | PRN
  Administered 2012-07-04: 1 mg via INTRAVENOUS
  Administered 2012-07-04 (×4): 2 mg via INTRAVENOUS

## 2012-07-04 MED ORDER — WARFARIN SODIUM 10 MG PO TABS
10.0000 mg | ORAL_TABLET | Freq: Once | ORAL | Status: AC
  Administered 2012-07-04: 10 mg via ORAL
  Filled 2012-07-04: qty 1

## 2012-07-04 MED ORDER — FENTANYL CITRATE 0.05 MG/ML IJ SOLN
INTRAMUSCULAR | Status: AC
  Filled 2012-07-04: qty 2

## 2012-07-04 MED ORDER — FONDAPARINUX SODIUM 10 MG/0.8ML ~~LOC~~ SOLN
10.0000 mg | Freq: Every day | SUBCUTANEOUS | Status: DC
  Administered 2012-07-04: 10 mg via SUBCUTANEOUS
  Filled 2012-07-04 (×2): qty 0.8

## 2012-07-04 MED ORDER — WARFARIN - PHARMACIST DOSING INPATIENT
Freq: Every day | Status: DC
  Administered 2012-07-05: 18:00:00

## 2012-07-04 MED ORDER — FENTANYL CITRATE 0.05 MG/ML IJ SOLN
INTRAMUSCULAR | Status: DC | PRN
  Administered 2012-07-04 (×4): 25 ug via INTRAVENOUS

## 2012-07-04 NOTE — Progress Notes (Signed)
ANTICOAGULATION CONSULT NOTE - Initial Consult  Pharmacy Consult for warfarin and Arixtra Indication: mechanical aortic valve  Allergies  Allergen Reactions  . Ace Inhibitors     Patient Measurements: Height: 6\' 2"  (188 cm) Weight: 306 lb 3.5 oz (138.9 kg) IBW/kg (Calculated) : 82.2  Vital Signs: Temp: 97.8 F (36.6 C) (03/06 1245) Temp src: Oral (03/06 1245) BP: 168/64 mmHg (03/06 1245) Pulse Rate: 74 (03/06 1245)  Labs:  Recent Labs  07/01/12 1840  07/02/12 1014 07/02/12 1707 07/03/12 0920 07/03/12 0925 07/04/12 0618  HGB  --   < >  --  8.3* 9.0*  --  9.2*  HCT  --   < >  --  24.2* 26.1*  --  26.3*  PLT  --   < >  --  278 293  --  320  LABPROT  --   --  26.2*  --   --  24.0* 20.5*  INR  --   --  2.55*  --   --  2.26* 1.83*  CREATININE 3.20*  --  2.67*  --   --   --  2.02*  < > = values in this interval not displayed.  Estimated Creatinine Clearance: 61.3 ml/min (by C-G formula based on Cr of 2.02).   Medical History: Past Medical History  Diagnosis Date  . 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 HTN (hypertension)   . CAD (coronary artery disease)   . Aortic aneurysm   . Gout   . Atrial fibrillation   . Anemia   . Hyperlipidemia   . Atrial flutter   . Obesity   . Anemia of renal disease 10/04/2011  . CKD (chronic kidney disease)     baseline creatinine is 1.5-2  . Gastric polyp 2007, 06/2012    benign.  . Colonic ulcer 2007    felt to be secondary to NSAID  . PONV (postoperative nausea and vomiting)   . Testicular adenoma     Medications:  Prescriptions prior to admission  Medication Sig Dispense Refill  . acetaminophen (TYLENOL) 500 MG tablet Take 500 mg by mouth every 6 (six) hours as needed. For pain      . amLODipine (NORVASC) 10 MG tablet Take 10 mg by mouth daily.       Marland Kitchen b complex vitamins tablet Take 1 tablet by mouth 2 (two) times daily.       . cloNIDine (CATAPRES) 0.1 MG tablet Take 0.1 mg by mouth 2 (two) times daily.        Marland Kitchen COLCRYS 0.6 MG tablet Take 0.6 mg by mouth daily.       . cyclobenzaprine (FLEXERIL) 10 MG tablet Take 5 mg by mouth at bedtime as needed.       . fondaparinux (ARIXTRA) 10 MG/0.8ML SOLN Inject 0.8 mLs (10 mg total) into the skin daily.  6 Syringe  0  . FORTESTA 10 MG/ACT (2%) GEL APPLY 2 PUMPS TO THIGH ONCE DAILY  60 g  3  . furosemide (LASIX) 20 MG tablet Take 20 mg by mouth daily. 4 times weekly. Tues thurs Saturday and Sunday      . HYDROcodone-acetaminophen (VICODIN) 5-500 MG per tablet Take 1 tablet by mouth every 6 (six) hours as needed for pain.  30 tablet  2  . labetalol (NORMODYNE) 200 MG tablet Take 200 mg by mouth 3 (three) times daily.       . metoprolol (TOPROL-XL) 100 MG 24 hr tablet Take 100  mg by mouth daily.       Marland Kitchen olmesartan (BENICAR) 40 MG tablet Take 1 tablet (40 mg total) by mouth daily.  90 tablet  3  . sildenafil (VIAGRA) 50 MG tablet Take 50 mg by mouth daily as needed.        Marland Kitchen ULORIC 40 MG tablet Take 80 mg by mouth daily.       Marland Kitchen warfarin (COUMADIN) 7.5 MG tablet Take by mouth daily.       Marland Kitchen amoxicillin (AMOXIL) 500 MG capsule Before dental appts.      Marland Kitchen MOVIPREP 100 G SOLR         Assessment: 57 year old man on chronic warfarin for mechanical aortic valve.  Anticoagulation has been on hold for GI bleeding.  Safe to resume anticoagulation now per GI consultant. Home dose of warfarin is 7.5mg  daily.  INR 1.83. Goal of Therapy:   INR 2.5-3.5   Plan:   Warfarin 10mg  po x 1 dose today  Daily protimes  arixtra 10mg  sq daily until INR therapeutic  Candie Mile 07/04/2012,12:50 PM

## 2012-07-04 NOTE — Progress Notes (Signed)
Pt and pt's wife states that they don't understand why pt is taking Avapro and that they needed the physician to come and explain it to them. Dr. Tana Coast who is on call has been notified who said they had spent almost an hour with pt and the wife explaining things to them. Dr, Tana Coast states she will let the PA, Ebony Hail to come and talk to them and address their concern.----Peace Dormon, rn

## 2012-07-04 NOTE — Care Management Note (Signed)
  Page 1 of 1   07/04/2012     2:51:40 PM   CARE MANAGEMENT NOTE 07/04/2012  Patient:  Calvin [name],Calvin [name]   Account Number:  [id number]  Date Initiated:  07/04/2012  Documentation initiated by:  Felix Ahmadi Assessment:   57 yr-old male adm with dx of GI Bleed   Per UR Regulation:  Reviewed for med. necessity/level of care/duration of stay  Comments:  07/04/12 [place] RN BSN MSN CCM Received referral to assist arranging lab draw for INR on Monday, 3/10.  Discussed with pt, arranged appt for Monday @ 9:30 @ [facility].  Information provided to pt.

## 2012-07-04 NOTE — Progress Notes (Signed)
Pt's bp has been going up in the 170's, Dr Tana Coast notified she said to go ahead and give pt's hs bp med----Peace Dormon, RN

## 2012-07-04 NOTE — Progress Notes (Signed)
Upper endoscopy demonstrates a healing gastric ulcer corresponding to the prior polypectomy site. The ulcerated area it is  clean-based. There is mild surrounding inflammation.  I think it is okay to restart anticoagulation therapy since he is at relatively low risk for rebleeding.

## 2012-07-04 NOTE — H&P (View-Only) (Signed)
     Calvin [name] Daily Rounding Note 07/03/2012, 8:31 AM  SUBJECTIVE:       Stools brown, no nausea.  No abd pain   OBJECTIVE:         Vital signs in last 24 hours:    Temp:  [98 F (36.7 C)-98.8 F (37.1 C)] 98.7 F (37.1 C) (03/05 0332) Pulse Rate:  [68-95] 95 (03/05 0332) Resp:  [19-29] 29 (03/05 0332) BP: (128-157)/(47-91) 140/51 mmHg (03/05 0332) SpO2:  [91 %-98 %] 91 % (03/05 0332) Last BM Date: 07/02/12 General: looks well   Heart: RRR Chest: clear B. Abdomen: soft, NT, ND, active sounds  Extremities: no edema Neuro/Psych:  Pleasant, not confused or anxious   Intake/Output from previous day: 03/04 0701 - 03/05 0700 In: -  Out: 1972 [Urine:1970; Stool:2]  Intake/Output this shift:    Lab Results:  Recent Labs  07/01/12 2055 07/02/12 1707  WBC 10.1 8.1  HGB 7.1* 8.3*  HCT 20.8* 24.2*  PLT 267 278   BMET  Recent Labs  07/01/12 1840 07/02/12 1014  NA 136 141  K 5.4* 4.6  CL 106 107  CO2 18* 21  GLUCOSE 110* 103*  BUN 77* 66*  CREATININE 3.20* 2.67*  CALCIUM 8.8 9.5     PT/INR  Recent Labs  07/01/12 1245 07/02/12 1014  LABPROT 26.6* 26.2*  INR 2.60* 2.55*  INR     2.2 at  930 this AM.                            ASSESMENT: * [name] bleed in pt on Coumadin/Arixtra. Polyp removed from stomach 2/17 suspect this is source of bleeding. Stool today is brown for first time since colon/EGD.  * ABL on top of anemia of chronic dz/renal failure. S/p 2 PRBCs. Hgb is up 1.2 grams.  Doses of Aranesp 05/28/12 and 06/28/12.  * S/P St Jude AVR: Reason for Coumadin.    *  Chronic kidney disease.  Creatinine improved.     PLAN: *  Repeat INR is 2.2:.  Defer EGD to tomorrow.    LOS: 2 days   Azucena Freed  07/03/2012, 8:31 AM Pager: [phone]  I have personally taken an interval history, reviewed the chart, and examined the patient.  I agree with the extender's note, impression and recommendations.  Sandy Salaam. Deatra Ina, MD, [facility] [phone]

## 2012-07-04 NOTE — Interval H&P Note (Signed)
History and Physical Interval Note:  07/04/2012 10:03 AM  Calvin [name]  has presented today for surgery, with the diagnosis of GI  bleed, had stomach polyps removed on Feb 17  The various methods of treatment have been discussed with the patient and family. After consideration of risks, benefits and other options for treatment, the patient has consented to  Procedure(s): ESOPHAGOGASTRODUODENOSCOPY (EGD) (N/A) as a surgical intervention .  The patient's history has been reviewed, patient examined, no change in status, stable for surgery.  I have reviewed the patient's chart and labs.  Questions were answered to the patient's satisfaction.     The recent H&P (dated **07/03/12*) was reviewed, the patient was examined and there is no change in the patients condition since that H&P was completed.   Erskine Emery  07/04/2012, 10:03 AM   Erskine Emery

## 2012-07-04 NOTE — Progress Notes (Signed)
[facility] Progress Note [place] TEAM 1 - Stepdown/ICU TEAM   Calvin [name] [id number] DOB: January 31, 1956 DOA: 07/01/2012 PCP: Jeb Levering, Philbert Riser, MD  Brief narrative: 57 year old male patient on chronic anticoagulation due to mechanical aortic valve. He also has chronic kidney disease and anemia related to this and is followed by Dr. Marin Olp who manages his Aranesp. He recently underwent endoscopic evaluation about 2 weeks ago at which time antral erosions were found and a polyp was biopsied from the stomach. Within 48 hours he developed dark stools associated with abdominal bloating and difficulty passing flatus. At followup visit with his hematologist his hemoglobin had decreased to 7.1. He was also developing progressive fatigue and somnolence. He presented to the emergency department where stools were heme-positive and his hemoglobin was 7.1. Of note his Coumadin had been held 5 days before the above-mentioned procedure and was on Arixtra bridge until INR became therapeutic. At presentation to the emergency department his INR was therapeutic at 2.6.  Assessment/Plan:  GI bleed -Appreciate GI assistance -Post EGD 3/6: bleeding from polypectomy site w/partial healing- per GI OK to resume anticoagulation-watch for recurrent bleeding -transition PPI gtt to PO BID-pt with diarrhea so ? In can decrease to QD dosing  Acute blood loss anemia and chronic anemia 2/2 CKD -post 2 units PRBC's since admit -hgb stable >9.0 -Last Aranesp 2/28-followed by Ennever as OP  Acute renal failure on CKD stage 3, GFR 30-59 ml/min -Scr at admit 3.2 with trend downward (3/4) after hydration/PRBC's- baseline 1.67 - repeat in am after Lasix  S/P St. Jude aortic valve replacement/Coarctation of aorta (previous repair and stent) -OK per cards to hold Coumadin/Arixtra pre EGD with admonition to pharmacological/FFP reversal (incr. Risk stent thrombosis) -GI has OK'd resume anticoagulation- likely pt  will dc home on Arixtra bridge- CM consult- pt would like to have [facility] to manage his Arixtra/Coumadin after dc  LE edema (bilateral) -On low dose Lasix at home (4x weekly) for edema-resume 3/6 -since admit and after transfusion and IVF's has developed LE edema - given 1x dose IV Lasix 40 mg 3/5 with excellent results  Thoracic aortic aneurysm  History of atrial flutter s/p DCCV  -Maintaining sinus rhythm -cont B blocker and resume coumadin when hgb stable  Hypertension -cont all home meds-Benicar resumed 3/6 post EGD -clarify before dc if truly was on Normodyne and Toprol at home  CAD (coronary artery disease) - no sx's even with anemia  Gout -continue Uloric and Colcichine  Hyperlipidemia  Hypogonadism male  DVT prophylaxis: SCDs Code Status: Full Family Communication: Patient and wife Disposition Plan: Stepdown  Consultants: Gastroenterology Cardiology  Procedures: EGD (07/04/12)  ENDOSCOPIC IMPRESSION:  1. bleeding from polypectomy site-partially healed  RECOMMENDATIONS:  1. continue PPI therapy  2. okay to resume anticoagulation therapy  Antibiotics: None  HPI/Subjective: Patient alert and denies abdominal pain, chest pain or shortness of breath. Endorsed edema in legs imporved but having ankle pain from gout (has not had Uloric yet today) Wife at bedside- questions answered.  Objective: Blood pressure 168/64, pulse 74, temperature 97.8 F (36.6 C), temperature source Oral, resp. rate 23, height 6\' 2"  (1.88 m), weight 138.9 kg (306 lb 3.5 oz), SpO2 98.00%.  Intake/Output Summary (Last 24 hours) at 07/04/12 1337 Last data filed at 07/04/12 1100  Gross per 24 hour  Intake   1088 ml  Output   2001 ml  Net   -913 ml   Exam: General: No acute respiratory distress Lungs: Clear to auscultation  bilaterally without wheezes or crackles Cardiovascular: Regular rate and rhythm without murmur gallop or rub normal S1 and S2 Abdomen: Nontender,  nondistended, soft, bowel sounds positive, no rebound, no ascites, no appreciable mass Extremities: No significant cyanosis, clubbing; decreased edema bilateral lower extremities  Data Reviewed: Basic Metabolic Panel:  Recent Labs Lab 07/01/12 1840 07/02/12 1014 07/04/12 0618  NA 136 141 141  K 5.4* 4.6 4.2  CL 106 107 106  CO2 18* 21 21  GLUCOSE 110* 103* 116*  BUN 77* 66* 36*  CREATININE 3.20* 2.67* 2.02*  CALCIUM 8.8 9.5 9.1   CBC:  Recent Labs Lab 06/28/12 1052 07/01/12 1115 07/01/12 2055 07/02/12 1707 07/03/12 0920 07/04/12 0618  WBC 6.1 9.0 10.1 8.1 6.9 7.4  NEUTROABS 4.3 7.6*  --   --   --   --   HGB 7.1* 7.0* 7.1* 8.3* 9.0* 9.2*  HCT 21.8* 21.8* 20.8* 24.2* 26.1* 26.3*  MCV 85 87 82.9 82.0 81.8 80.2  PLT 318 302 267 278 293 320    Recent Results (from the past 240 hour(s))  MRSA PCR SCREENING     Status: None   Collection Time    07/01/12  6:34 PM      Result Value Range Status   MRSA by PCR NEGATIVE  NEGATIVE Final   Comment:            The GeneXpert MRSA Assay (FDA     approved for NASAL specimens     only), is one component of a     comprehensive MRSA colonization     surveillance program. It is not     intended to diagnose MRSA     infection nor to guide or     monitor treatment for     MRSA infections.     Studies:  Recent x-ray studies have been reviewed in detail by the Attending Physician  Scheduled Meds:  Reviewed in detail by the Attending Physician   Erin Hearing, [place] [facility] Office  [phone] Pager [phone]  On-Call/Text Page:      [email or web address]      password [email or web address]  If 7PM-7AM, please contact night-coverage [email or web address] Password [email or web address] 07/04/2012, 1:37 PM   LOS: 3 days    I have personally examined the patient and reviewed the entire database. Agree with the above note and plan as outlined, any necessary changes made. Post EGD, bleeding from polypectomy site with partial healing, okayed by GI to resume  anticoagulation, will monitor overnight for any recurrent bleeding. Likely DC tomorrow if h/h stable and no bleeding.  RAI,RIPUDEEP M.D. [facility] 07/04/2012, 2:17 PM Pager: [phone]  If 7PM-7AM, please contact night-coverage [email or web address] Password [email or web address]

## 2012-07-04 NOTE — Progress Notes (Signed)
Pt came from endo to 2615 in a stable condition, wife at the bedside.-----Peace Dormon, rn

## 2012-07-04 NOTE — Op Note (Signed)
[facility] [place], [id number]   ENDOSCOPY PROCEDURE REPORT  PATIENT: Calvin [name], Calvin [name]  MR#: [id number] BIRTHDATE: 1955-11-23 , 74  yrs. old GENDER: Male ENDOSCOPIST: Inda Castle, MD REFERRED BY:  Daniel Nones, M.D. PROCEDURE DATE:  07/04/2012 PROCEDURE:  EGD, diagnostic ASA CLASS:     Class III INDICATIONS:  Melena.; recent gastric polypectomy MEDICATIONS: These medications were titrated to patient response per physician's verbal order, Versed 9 mg IV, and Fentanyl 100 mcg IV  TOPICAL ANESTHETIC: Cetacaine Spray  DESCRIPTION OF PROCEDURE: After the risks benefits and alternatives of the procedure were thoroughly explained, informed consent was obtained.  The Pentax Gastroscope [id number] endoscope was introduced through the mouth and advanced to the third portion of the duodenum. Without limitations.  The instrument was slowly withdrawn as the mucosa was fully examined.      In the gastric body along the lesser curvature there is a remnant of prior gastric polypectomy.  There is a very superficial-based ulcer and surrounding inflamed mucosa.   The remainder of the upper endoscopy exam was otherwise normal.  Retroflexed views revealed no abnormalities.     The scope was then withdrawn from the patient and the procedure completed.  COMPLICATIONS: There were no complications. ENDOSCOPIC IMPRESSION: 1. bleeding from polypectomy site-partially healed  RECOMMENDATIONS: 1.  continue PPI therapy 2.  okay to resume  anticoagulation therapy REPEAT EXAM:  eSigned:  Inda Castle, MD 07/04/2012 10:28 AM   CC:

## 2012-07-05 ENCOUNTER — Other Ambulatory Visit: Payer: BC Managed Care – PPO | Admitting: Lab

## 2012-07-05 LAB — CBC
HCT: 24.2 % — ABNORMAL LOW (ref 39.0–52.0)
Hemoglobin: 8.4 g/dL — ABNORMAL LOW (ref 13.0–17.0)
MCV: 81.8 fL (ref 78.0–100.0)
RBC: 2.96 MIL/uL — ABNORMAL LOW (ref 4.22–5.81)
WBC: 7.2 10*3/uL (ref 4.0–10.5)

## 2012-07-05 MED ORDER — WARFARIN SODIUM 10 MG PO TABS
10.0000 mg | ORAL_TABLET | Freq: Once | ORAL | Status: AC
  Administered 2012-07-05: 10 mg via ORAL
  Filled 2012-07-05: qty 1

## 2012-07-05 NOTE — Progress Notes (Signed)
[facility] Progress Note [name]   Calvin [name] [id number] DOB: [birth year] DOA: 07/01/2012 PCP: Calvin [name], Calvin Riser, MD  Brief narrative: 57 year old male patient on chronic anticoagulation due to mechanical aortic valve. He also has chronic kidney disease and anemia related to this and is followed by Dr. Marin [name] who manages his Aranesp. He recently underwent endoscopic evaluation about 2 weeks ago at which time antral erosions were found and a polyp was biopsied from the stomach. Within 48 hours he developed dark stools associated with abdominal bloating and difficulty passing flatus. At followup visit with his hematologist his hemoglobin had decreased to 7.1. He was also developing progressive fatigue and somnolence. He presented to the emergency department where stools were heme-positive and his hemoglobin was 7.1. Of note his Coumadin had been held 5 days before the above-mentioned procedure and was on Arixtra bridge until INR became therapeutic. At presentation to the emergency department his INR was therapeutic at 2.6.  Assessment/Plan:  GI bleed -Appreciate GI assistance -EGD 3/6: bleeding from polypectomy site w/partial healing- per GI OK to resume anticoagulation - watch for recurrent bleeding -transition PPI gtt to PO BID - pt had diarrhea that resolved after change to PO PPI  Acute blood loss anemia and chronic anemia 2/2 CKD -post 2 units PRBC's since admit -hgb essentially stable but has drifted downward in past 24 hrs noting anti-coagulation resumed but BUN has decreased which is more suggestive that bleeding/ooze has stopped -if continues to drop pt made aware may require add'l PRBC's -Last Aranesp 2/28 - followed by Calvin [name] as OP  Acute renal failure on CKD stage 3, GFR 30-59 ml/min -Scr at admit 3.2 with trend downward (3/4) after hydration/PRBC's - baseline 1.67 - repeat in am after Lasix  S/P St. Jude aortic valve  replacement/Coarctation of aorta (previous repair and stent) -GI has OK'd resume anticoagulation 3/6- Cards has Dc'd Arixtra bridge favoring Coumadin alone (see above) - pt would like to have Calvin [name] to manage his Coumadin after dc  LE edema (bilateral) -On low dose Lasix at home (4x weekly) for edema - resumed 3/6 -since admit and after transfusion and IVF's has developed LE edema - given 1x dose IV Lasix 40 mg 3/5 with excellent results  History of atrial flutter s/p DCCV  -Maintaining sinus rhythm -cont B blocker and resume coumadin when hgb stable  Hypertension -cont all home meds - Benicar resumed 3/6 post EGD -clarify before dc if truly was on Normodyne and Toprol at home  CAD (coronary artery disease) - no sx's even with anemia  Gout -continue Uloric and Colcichine  Hyperlipidemia  Hypogonadism male  DVT prophylaxis: SCDs Code Status: Full Family Communication: Patient  Disposition Plan: Stepdown  Consultants: Gastroenterology Cardiology  Procedures: EGD (07/04/12)  ENDOSCOPIC IMPRESSION:  1. bleeding from polypectomy site-partially healed  RECOMMENDATIONS:  1. continue PPI therapy  2. okay to resume anticoagulation therapy  Antibiotics: None  HPI/Subjective: Patient alert and primarily c/o bilateral plantar foot pain/burning. Explanation given re: rationale for therapeutic substitution of Avapro for his home Benicar. MD also discussed need to ensure Hgb stable before dc home.  Objective: Blood pressure 152/57, pulse 79, temperature 98.5 F (36.9 C), temperature source Oral, resp. rate 29, height 6\' 2"  (1.88 m), weight 138.9 kg (306 lb 3.5 oz), SpO2 97.00%.  Intake/Output Summary (Last 24 hours) at 07/05/12 1249 Last data filed at 07/05/12 1236  Gross per 24 hour  Intake    680 ml  Output   1300 ml  Net   -620 ml   Exam: General: No acute respiratory distress Lungs: Clear to auscultation bilaterally without wheezes or  crackles Cardiovascular: Regular rate and rhythm without murmur gallop or rub normal S1 and S2 Abdomen: Nontender, nondistended, soft, bowel sounds positive, no rebound, no ascites, no appreciable mass Extremities: No significant cyanosis, clubbing; decreased edema bilateral lower extremities  Data Reviewed: Basic Metabolic Panel:  Recent Labs Lab 07/01/12 1840 07/02/12 1014 07/04/12 0618  NA 136 141 141  K 5.4* 4.6 4.2  CL 106 107 106  CO2 18* 21 21  GLUCOSE 110* 103* 116*  BUN 77* 66* 36*  CREATININE 3.20* 2.67* 2.02*  CALCIUM 8.8 9.5 9.1   CBC:  Recent Labs Lab 07/01/12 1115 07/01/12 2055 07/02/12 1707 07/03/12 0920 07/04/12 0618 07/05/12 0520  WBC 9.0 10.1 8.1 6.9 7.4 7.2  NEUTROABS 7.6*  --   --   --   --   --   HGB 7.0* 7.1* 8.3* 9.0* 9.2* 8.4*  HCT 21.8* 20.8* 24.2* 26.1* 26.3* 24.2*  MCV 87 82.9 82.0 81.8 80.2 81.8  PLT 302 267 278 293 320 289    Recent Results (from the past 240 hour(s))  MRSA PCR SCREENING     Status: None   Collection Time    07/01/12  6:34 PM      Result Value Range Status   MRSA by PCR NEGATIVE  NEGATIVE Final   Comment:            The GeneXpert MRSA Assay (FDA     approved for NASAL specimens     only), is one component of a     comprehensive MRSA colonization     surveillance program. It is not     intended to diagnose MRSA     infection nor to guide or     monitor treatment for     MRSA infections.     Studies:  Recent x-ray studies have been reviewed in detail by the Attending Physician  Scheduled Meds:  Reviewed in detail by the Attending Physician   Erin Hearing, [place] [facility] Office  [phone] Pager [phone]  On-Call/Text Page:      [email or web address]      password [email or web address]  If 7PM-7AM, please contact night-coverage [email or web address] Password [email or web address] 07/05/2012, 12:49 PM   LOS: 4 days   I have personally examined this patient and reviewed the entire database. I have reviewed the above note, made any  necessary editorial changes, and agree with its content.  Cherene Altes, MD [facility]

## 2012-07-05 NOTE — Progress Notes (Signed)
   Subjective:  Denies CP or dyspnea; stools remain brown   Objective:  Filed Vitals:   07/04/12 1739 07/04/12 2019 07/04/12 2358 07/05/12 0353  BP:  139/52 162/57 169/60  Pulse:  77 80 75  Temp: 98.4 F (36.9 C) 98.6 F (37 C) 98.1 F (36.7 C) 98.9 F (37.2 C)  TempSrc:  Oral Oral Oral  Resp:  21 25 23   Height:      Weight:      SpO2:  98% 99% 95%    Intake/Output from previous day:  Intake/Output Summary (Last 24 hours) at 07/05/12 0801 Last data filed at 07/05/12 0353  Gross per 24 hour  Intake    820 ml  Output   1000 ml  Net   -180 ml    Physical Exam: Physical exam: Well-developed well-nourished in no acute distress.  Skin is warm and dry.  HEENT is normal.  Neck is supple.  Chest with mildly diminished BS bases Cardiovascular exam is regular rate and rhythm. Crisp mechanical valve sound; 2/6 systolic murmur Abdominal exam nontender or distended. No masses palpated. Extremities show trace edema. neuro grossly intact    Lab Results: Basic Metabolic Panel:  Recent Labs  07/02/12 1014 07/04/12 0618  NA 141 141  K 4.6 4.2  CL 107 106  CO2 21 21  GLUCOSE 103* 116*  BUN 66* 36*  CREATININE 2.67* 2.02*  CALCIUM 9.5 9.1   CBC:  Recent Labs  07/04/12 0618 07/05/12 0520  WBC 7.4 7.2  HGB 9.2* 8.4*  HCT 26.3* 24.2*  MCV 80.2 81.8  PLT 320 289     Assessment/Plan:  1 GI bleed-stools remain brown. Hgb decreased to 8.4 today. Would DC Arixtra. Continue coumadin and allow INR to increase on its own with no bridge. Upper endoscopy results noted. Follow hemoglobin. Continue Protonix. Would keep in house and recheck Hgb in AM. May need additional transfusion. 2 acute on chronic stage III renal failure-improving; ARB and lasix resumed. 3 status post aortic valve replacement-continue Coumadin. 4 hypertension continue remaining medications. May need to advance ARB 5 history of atrial flutter status post cardioversion-patient remains in sinus rhythm.     Kirk Ruths 07/05/2012, 8:01 AM

## 2012-07-05 NOTE — Progress Notes (Signed)
ANTICOAGULATION CONSULT NOTE - Initial Consult  Pharmacy Consult for warfarin and Arixtra Indication: mechanical aortic valve  Allergies  Allergen Reactions  . Ace Inhibitors     Patient Measurements: Height: 6\' 2"  (188 cm) Weight: 306 lb 3.5 oz (138.9 kg) IBW/kg (Calculated) : 82.2  Vital Signs: Temp: 98.9 F (37.2 C) (03/07 0353) Temp src: Oral (03/07 0353) BP: 169/60 mmHg (03/07 0353) Pulse Rate: 75 (03/07 0353)  Labs:  Recent Labs  07/02/12 1014  07/03/12 0920 07/03/12 0925 07/04/12 0618 07/05/12 0520  HGB  --   < > 9.0*  --  9.2* 8.4*  HCT  --   < > 26.1*  --  26.3* 24.2*  PLT  --   < > 293  --  320 289  LABPROT 26.2*  --   --  24.0* 20.5* 21.2*  INR 2.55*  --   --  2.26* 1.83* 1.92*  CREATININE 2.67*  --   --   --  2.02*  --   < > = values in this interval not displayed.  Estimated Creatinine Clearance: 61.3 ml/min (by C-G formula based on Cr of 2.02).    Assessment: 57 year old man on chronic warfarin for mechanical aortic valve.  Anticoagulation has been on hold for GI bleeding.  Safe to resume anticoagulation now per GI consultant. Home dose of warfarin is 7.5mg  daily.  INR 1.92. Goal of Therapy:   INR 2.5-3.5   Plan:   Warfarin 10mg  po x 1 dose today  Daily protimes  arixtra 10mg  sq daily until INR therapeutic  Candie Mile 07/05/2012,7:58 AM

## 2012-07-06 DIAGNOSIS — I10 Essential (primary) hypertension: Secondary | ICD-10-CM

## 2012-07-06 LAB — BASIC METABOLIC PANEL
BUN: 24 mg/dL — ABNORMAL HIGH (ref 6–23)
CO2: 22 mEq/L (ref 19–32)
Chloride: 104 mEq/L (ref 96–112)
Creatinine, Ser: 1.69 mg/dL — ABNORMAL HIGH (ref 0.50–1.35)
Potassium: 3.8 mEq/L (ref 3.5–5.1)

## 2012-07-06 LAB — CBC
HCT: 24.2 % — ABNORMAL LOW (ref 39.0–52.0)
MCV: 80.1 fL (ref 78.0–100.0)
RBC: 3.02 MIL/uL — ABNORMAL LOW (ref 4.22–5.81)
WBC: 7.9 10*3/uL (ref 4.0–10.5)

## 2012-07-06 LAB — PROTIME-INR: INR: 1.79 — ABNORMAL HIGH (ref 0.00–1.49)

## 2012-07-06 LAB — PREPARE RBC (CROSSMATCH)

## 2012-07-06 MED ORDER — FUROSEMIDE 10 MG/ML IJ SOLN
20.0000 mg | Freq: Once | INTRAMUSCULAR | Status: AC
  Administered 2012-07-06: 20 mg via INTRAVENOUS
  Filled 2012-07-06: qty 2

## 2012-07-06 MED ORDER — WARFARIN SODIUM 10 MG PO TABS
10.0000 mg | ORAL_TABLET | Freq: Once | ORAL | Status: AC
  Administered 2012-07-06: 10 mg via ORAL
  Filled 2012-07-06: qty 1

## 2012-07-06 MED ORDER — PREDNISONE 20 MG PO TABS
30.0000 mg | ORAL_TABLET | Freq: Every day | ORAL | Status: DC
  Filled 2012-07-06: qty 1

## 2012-07-06 MED ORDER — PREDNISONE 20 MG PO TABS
40.0000 mg | ORAL_TABLET | Freq: Once | ORAL | Status: AC
  Administered 2012-07-06: 40 mg via ORAL
  Filled 2012-07-06: qty 2

## 2012-07-06 MED ORDER — LABETALOL HCL 200 MG PO TABS
200.0000 mg | ORAL_TABLET | Freq: Three times a day (TID) | ORAL | Status: DC
  Administered 2012-07-06 – 2012-07-07 (×4): 200 mg via ORAL
  Filled 2012-07-06 (×7): qty 1

## 2012-07-06 MED ORDER — HYDRALAZINE HCL 20 MG/ML IJ SOLN: 10.0000 mg | Freq: Four times a day (QID) | INTRAMUSCULAR | Status: DC | PRN

## 2012-07-06 MED ORDER — CLONIDINE HCL 0.1 MG PO TABS
0.1000 mg | ORAL_TABLET | Freq: Two times a day (BID) | ORAL | Status: DC
  Administered 2012-07-06 – 2012-07-07 (×3): 0.1 mg via ORAL
  Filled 2012-07-06 (×4): qty 1

## 2012-07-06 MED ORDER — CLONIDINE HCL 0.1 MG PO TABS
0.1000 mg | ORAL_TABLET | Freq: Three times a day (TID) | ORAL | Status: DC
  Filled 2012-07-06 (×4): qty 1

## 2012-07-06 MED ORDER — HYDROCODONE-ACETAMINOPHEN 5-325 MG PO TABS
1.0000 | ORAL_TABLET | Freq: Four times a day (QID) | ORAL | Status: DC | PRN
  Administered 2012-07-06 (×2): 1 via ORAL
  Filled 2012-07-06 (×2): qty 1

## 2012-07-06 NOTE — Progress Notes (Signed)
Pt's BP 191/61 and Dr. Blenda Nicely notified. New orders written but pt refusing to take any new medications, saying he is on home BP meds and only wants to take those. Dr. Blenda Nicely notified of refusal. Re-took pt's BP and it is 180/57. Will continue to monitor pt.

## 2012-07-06 NOTE — Progress Notes (Signed)
[facility] Progress Note [place] TEAM 1 - Stepdown/ICU TEAM   Calvin [name] [id number] DOB: 1955-09-10 DOA: 07/01/2012 PCP: Jeb Levering, Philbert Riser, MD  Brief narrative: 57 year old male patient on chronic anticoagulation due to mechanical aortic valve. He also has chronic kidney disease and anemia related to this and is followed by Dr. Marin Olp who manages his Aranesp. He underwent endoscopic evaluation ~2 weeks prior to his admission at which time antral erosions were found and a polyp was biopsied in the stomach. Within 48 hours he developed dark stools associated with abdominal bloating and difficulty passing flatus. At followup visit with his hematologist his hemoglobin had decreased to 7.1. He was also developing progressive fatigue and somnolence. He presented to the emergency department where stools were heme-positive and his hemoglobin was again 7.1. Of note his Coumadin had been held 5 days before the above-mentioned procedure and he was on Arixtra bridge until INR became therapeutic. At presentation to the emergency department his INR was therapeutic at 2.6.  Assessment/Plan:  GI bleed -EGD 3/6: bleeding from polypectomy site w/partial healing- per GI OK to resume anticoagulation  -transitioned PPI gtt to PO BID - pt had diarrhea that resolved after change to PO PPI  Acute blood loss anemia and chronic anemia 2/2 CKD -post 4 units PRBC's since admit - giving an additional 2 units today in anticipation of discharge -Last Aranesp 2/28 - followed by Ennever as OP -No evidence of active bleeding at this time  Acute renal failure on CKD stage 3, GFR 30-59 ml/min -Scr at admit 3.2 with trend downward after hydration/PRBC's - baseline 1.67 - now stable  S/P St. Jude aortic valve replacement / Coarctation of aorta (previous repair and stent) -GI has OK'd resuming anticoagulation 3/6 - Cards has Dc'd Arixtra bridge favoring Coumadin alone - patient now informs me that he would  prefer to have doctor Ennever monitor his INR  LE edema (bilateral) -On low dose Lasix at home (4x weekly) for edema - resumed 3/6 - since admit and after transfusion and IVF's has developed worsening LE edema - given 1x dose IV Lasix 40 mg 3/5 with excellent results  History of atrial flutter s/p DCCV  -Maintaining sinus rhythm -cont B blocker and coumadin  Hypertension -cont all home meds - Benicar resumed 3/6 post EGD  CAD (coronary artery disease) - no sx's even with anemia  Gout -Despite continued Uloric and Colcichine patient is having significant pain in his knees and feet - this has previously responded to a short course of prednisone - initiate prednisone today and follow  Hyperlipidemia  Hypogonadism male Resume home treatment regimen  DVT prophylaxis: SCDs Code Status: Full Family Communication: Patient  Disposition Plan: Stepdown  Consultants: Gastroenterology Cardiology  Procedures: EGD (07/04/12)  ENDOSCOPIC IMPRESSION:  1. bleeding from polypectomy site-partially healed   Antibiotics: None  HPI/Subjective: Patient alert and primarily c/o bilateral plantar foot pain/burning.  Pain has not improved with his current medications and is limiting his ability to ambulate.  He denies melena hematochezia abdominal cramps or lightheadedness.  Objective: Blood pressure 141/46, pulse 70, temperature 98.3 F (36.8 C), temperature source Oral, resp. rate 18, height 6\' 2"  (1.88 m), weight 137 kg (302 lb 0.5 oz), SpO2 99.00%.  Intake/Output Summary (Last 24 hours) at 07/06/12 1656 Last data filed at 07/06/12 1600  Gross per 24 hour  Intake   1215 ml  Output   1920 ml  Net   -705 ml   Exam: General: No acute respiratory distress  Lungs: Clear to auscultation bilaterally without wheezes or crackles Cardiovascular: Regular rate and rhythm without murmur gallop or rub normal S1 and S2 Abdomen: Nontender, nondistended, soft, bowel sounds positive, no rebound, no  ascites, no appreciable mass Extremities: No significant cyanosis, clubbing; trace edema bilateral lower extremities  Data Reviewed: Basic Metabolic Panel:  Recent Labs Lab 07/01/12 1840 07/02/12 1014 07/04/12 0618 07/06/12 0506  NA 136 141 141 138  K 5.4* 4.6 4.2 3.8  CL 106 107 106 104  CO2 18* 21 21 22   GLUCOSE 110* 103* 116* 106*  BUN 77* 66* 36* 24*  CREATININE 3.20* 2.67* 2.02* 1.69*  CALCIUM 8.8 9.5 9.1 8.6   CBC:  Recent Labs Lab 07/01/12 1115  07/02/12 1707 07/03/12 0920 07/04/12 0618 07/05/12 0520 07/06/12 0506  WBC 9.0  < > 8.1 6.9 7.4 7.2 7.9  NEUTROABS 7.6*  --   --   --   --   --   --   HGB 7.0*  < > 8.3* 9.0* 9.2* 8.4* 8.4*  HCT 21.8*  < > 24.2* 26.1* 26.3* 24.2* 24.2*  MCV 87  < > 82.0 81.8 80.2 81.8 80.1  PLT 302  < > 278 293 320 289 299  < > = values in this interval not displayed.  Recent Results (from the past 240 hour(s))  MRSA PCR SCREENING     Status: None   Collection Time    07/01/12  6:34 PM      Result Value Range Status   MRSA by PCR NEGATIVE  NEGATIVE Final   Comment:            The GeneXpert MRSA Assay (FDA     approved for NASAL specimens     only), is one component of a     comprehensive MRSA colonization     surveillance program. It is not     intended to diagnose MRSA     infection nor to guide or     monitor treatment for     MRSA infections.     Studies:  Recent x-ray studies have been reviewed in detail by the Attending Physician  Scheduled Meds:  Reviewed in detail by the Attending Physician   Cherene Altes, MD [facility] Office  [phone] Pager [phone]  On-Call/Text Page:      [email or web address]      password [email or web address]   If 7PM-7AM, please contact night-coverage [email or web address] Password [facility] 07/06/2012, 4:56 PM   LOS: 5 days

## 2012-07-06 NOTE — Progress Notes (Addendum)
   Subjective:  Denies CP or dyspnea; stools remain brown   Objective:  Filed Vitals:   07/05/12 2052 07/06/12 0035 07/06/12 0519 07/06/12 0607  BP: 174/50 174/50 191/61 180/57  Pulse: 80 73 73 71  Temp: 98.5 F (36.9 C) 99.8 F (37.7 C) 98.9 F (37.2 C)   TempSrc: Oral Oral Oral   Resp: 16 32 33 27  Height:      Weight:   302 lb 0.5 oz (137 kg)   SpO2: 98% 95% 96% 97%    Intake/Output from previous day:  Intake/Output Summary (Last 24 hours) at 07/06/12 0733 Last data filed at 07/06/12 0520  Gross per 24 hour  Intake    840 ml  Output   1000 ml  Net   -160 ml    Physical Exam: Physical exam: Well-developed well-nourished in no acute distress.  Skin is warm and dry.  HEENT is normal.  Neck is supple.  Chest with mildly diminished BS bases Cardiovascular exam is regular rate and rhythm. Crisp mechanical valve sound; 2/6 systolic murmur Abdominal exam nontender or distended. No masses palpated. Extremities show trace edema. neuro grossly intact    Lab Results: Basic Metabolic Panel:  Recent Labs  07/04/12 0618 07/06/12 0506  NA 141 138  K 4.2 3.8  CL 106 104  CO2 21 22  GLUCOSE 116* 106*  BUN 36* 24*  CREATININE 2.02* 1.69*  CALCIUM 9.1 8.6   CBC:  Recent Labs  07/05/12 0520 07/06/12 0506  WBC 7.2 7.9  HGB 8.4* 8.4*  HCT 24.2* 24.2*  MCV 81.8 80.1  PLT 289 299     Assessment/Plan:  1 GI bleed-stools remain brown. Hgb uchanged at 8.4 today. Continue coumadin and allow INR to increase on its own with no bridge. Patient still with some weakness; would transfuse 2 units PRBCs as he feels much better with higher Hgb. Continue Protonix.  2 acute on chronic stage III renal failure-improving; ARB and lasix resumed. 3 status post aortic valve replacement-continue Coumadin. 4 hypertension continue remaining medications. BP elevated; resume labetalol. 5 history of atrial flutter status post cardioversion-patient remains in sinus rhythm.  Patient can  be DCed following transfusion from a cardiac standpoint and fu in coumadin clinic Monday as scheduled. FU with me 2-4 weeks after DC. Calvin [name] 07/06/2012, 7:33 AM

## 2012-07-06 NOTE — Progress Notes (Signed)
ANTICOAGULATION CONSULT NOTE - Follow Up Consult  Pharmacy Consult for warfarin Indication: mechanical aortic valve  Allergies  Allergen Reactions  . Ace Inhibitors     Patient Measurements: Height: 6\' 2"  (188 cm) Weight: 302 lb 0.5 oz (137 kg) IBW/kg (Calculated) : 82.2  Vital Signs: Temp: 99.2 F (37.3 C) (03/08 1341) Temp src: Oral (03/08 1341) BP: 129/45 mmHg (03/08 1341) Pulse Rate: 73 (03/08 1341)  Labs:  Recent Labs  07/04/12 0618 07/05/12 0520 07/06/12 0506  HGB 9.2* 8.4* 8.4*  HCT 26.3* 24.2* 24.2*  PLT 320 289 299  LABPROT 20.5* 21.2* 20.2*  INR 1.83* 1.92* 1.79*  CREATININE 2.02*  --  1.69*    Estimated Creatinine Clearance: 72.7 ml/min (by C-G formula based on Cr of 1.69).   Medications:  Scheduled:  . amLODipine  10 mg Oral Daily  . B-complex with vitamin C  1 tablet Oral Daily  . cloNIDine  0.1 mg Oral BID  . colchicine  0.6 mg Oral Daily  . febuxostat  80 mg Oral Daily  . [COMPLETED] furosemide  20 mg Intravenous Once  . furosemide  20 mg Oral Q T,Th,S,Su  . irbesartan  75 mg Oral Daily  . labetalol  200 mg Oral Q8H  . metoprolol succinate  100 mg Oral Daily  . pantoprazole  40 mg Oral BID  . sodium chloride  1,000 mL Intravenous Once  . sodium chloride  3 mL Intravenous Q12H  . [COMPLETED] warfarin  10 mg Oral ONCE-1800  . Warfarin - Pharmacist Dosing Inpatient   Does not apply q1800  . [DISCONTINUED] cloNIDine  0.1 mg Oral BID  . [DISCONTINUED] cloNIDine  0.1 mg Oral Q8H    Assessment: 57 year old male continues on warfarin for mechanical aortic valve. Patient also has history of aflutter (currently in NSR), so per Dr. Thereasa Solo will aim for INR goal of 2.5-3.5. Patient admitted for GI bleed, however this is resolved. Per cardiology, patient to discontinue Arixtra bridge and continue on warfarin alone.   Patient's home dose of warfarin is 7.5 mg daily. INR today is 1.79. Will give 10mg  warfarin today before discharge. Would recommend  patient take 7.5mg  tomorrow then follow up with [facility] on Monday.   Goal of Therapy:  INR 2.5-3.5   Plan:  - Warfarin 10mg  today before discharge - Would recommend patient to take 7.5mg  tomorrow and follow up with [facility] on Monday for INR check  Dicky Doe, PharmD Clinical Pharmacist Pager: [phone] Phone: [phone] 07/06/2012 2:20 PM

## 2012-07-07 LAB — TYPE AND SCREEN
ABO/RH(D): O POS
Unit division: 0

## 2012-07-07 LAB — CBC
HCT: 31.2 % — ABNORMAL LOW (ref 39.0–52.0)
MCH: 25.5 pg — ABNORMAL LOW (ref 26.0–34.0)
MCHC: 31.4 g/dL (ref 30.0–36.0)
MCV: 81.3 fL (ref 78.0–100.0)
Platelets: 289 10*3/uL (ref 150–400)
RDW: 14.9 % (ref 11.5–15.5)
WBC: 6.7 10*3/uL (ref 4.0–10.5)

## 2012-07-07 MED ORDER — PANTOPRAZOLE SODIUM 40 MG PO TBEC: 40.0000 mg | DELAYED_RELEASE_TABLET | Freq: Two times a day (BID) | ORAL | Status: DC

## 2012-07-07 MED ORDER — METOPROLOL SUCCINATE ER 50 MG PO TB24: 50.0000 mg | ORAL_TABLET | Freq: Every day | ORAL | Status: DC

## 2012-07-07 MED ORDER — LABETALOL HCL 200 MG PO TABS
200.0000 mg | ORAL_TABLET | Freq: Two times a day (BID) | ORAL | Status: DC
Start: 2012-07-07 — End: 2012-07-07
  Filled 2012-07-07: qty 1

## 2012-07-07 MED ORDER — PREDNISONE 10 MG PO TABS: ORAL_TABLET | ORAL | Status: DC

## 2012-07-07 MED ORDER — LABETALOL HCL 200 MG PO TABS: ORAL_TABLET | ORAL | Status: DC

## 2012-07-07 MED ORDER — PREDNISONE 20 MG PO TABS
30.0000 mg | ORAL_TABLET | Freq: Every day | ORAL | Status: AC
  Administered 2012-07-07: 30 mg via ORAL

## 2012-07-07 NOTE — Progress Notes (Signed)
Pt seen and Dc`d home by Dr Thereasa Solo.Dc instructions given and patient verbalized understanding.PIV removed.patient showered and was escorted out on a wheel chair.

## 2012-07-07 NOTE — Discharge Summary (Signed)
DISCHARGE SUMMARY  Calvin [name]  MR#: [id number]  DOB:03/28/1956  Date of Admission: 07/01/2012 Date of Discharge: 07/07/2012  Attending Physician:MCCLUNG,JEFFREY T  Patient's [id number] Brooke Bonito Philbert Riser, MD  Consults: [facility]  Disposition: D/C home   Follow-up Appts:     Follow-up Information   Follow up with [facility]. (Coumadin Clinic, Monday 3/10 @ 9:30 a.m.)    Contact information:   [place] [address]       Call Volanda Napoleon, MD. (call to arrange INR and CBC checks Tuesday afternoon or Wednesday morning as we discussed)    Contact information:   [place], [place] [address] [phone]       Schedule an appointment as soon as possible for a visit with Jeb Levering, Philbert Riser, MD.   Contact information:   [address] [place] [address] [phone]       Tests Needing Follow-up: INR and CBC 07/09/12 afternoon or 07/10/12 morning - pt to arrange w/ Dr. Marin Olp as per his request  Discharge Diagnoses: GI bleed -  bleeding from polypectomy site Acute blood loss anemia and chronic anemia 2/2 CKD  Acute renal failure on CKD stage 3, GFR 30-59 ml/min  S/P St. Jude aortic valve replacement / Coarctation of aorta (previous repair and stent)  LE edema (bilateral)  History of atrial flutter s/p DCCV  Hypertension  CAD (coronary artery disease)  Gout  Hyperlipidemia  Hypogonadism male   Initial presentation: 57 year old male patient on chronic anticoagulation due to mechanical aortic valve. He also has chronic kidney disease and anemia related to this and is followed by Dr. Marin Olp who manages his Aranesp. He underwent endoscopic evaluation ~2 weeks prior to his admission at which time antral erosions were found and a polyp was biopsied in the stomach. Within 48 hours he developed dark stools associated with abdominal bloating and difficulty passing flatus. At  followup visit with his hematologist his hemoglobin had decreased to 7.1. He was also developing progressive fatigue and somnolence. He presented to the emergency department where stools were heme-positive and his hemoglobin was again 7.1. Of note his Coumadin had been held 5 days before the above-mentioned procedure and he was on Arixtra bridge until INR became therapeutic. At presentation to the emergency department his INR was therapeutic at 2.6.  Hospital Course:  GI bleed  -EGD 3/6: bleeding from polypectomy site w/partial healing- per GI OK to resume anticoagulation  -transitioned PPI gtt to PO BID   Acute blood loss anemia and chronic anemia 2/2 CKD  -post 6 units total PRBC's since admit -Last Aranesp 2/28 - followed by Ennever as OP  -No evidence of active bleeding at time of D/C   Acute renal failure on CKD stage 3, GFR 30-59 ml/min  -Scr at admit 3.2 with trend downward after hydration/PRBC's - baseline 1.67 - now stable with d/c crt of 1.69  S/P St. Jude aortic valve replacement / Coarctation of aorta (previous repair and stent)  -GI OK'd resuming anticoagulation 3/6 - Cards has Dc'd Arixtra bridge favoring Coumadin alone - patient informed MD that he would prefer to have doctor Ennever monitor his INR - was advised to arrange recheck of INR and CBC 07/09/12 afternoon or 07/10/12 morning at the latest   LE edema (bilateral)  -On low dose Lasix at home (4x weekly) for edema - resumed 3/6  - since admit and after transfusion and IVF's developed worsening LE edema - given  1x dose IV Lasix 40 mg 3/5 with excellent results   History of atrial flutter s/p DCCV  -Maintaining sinus rhythm  -cont B blocker and coumadin  -d/c meds changed to reflect the fashion in which pt was ACTUALLY taking his meds prior to admit  Hypertension  -cont all home meds - Benicar resumed 3/6 post EGD   CAD (coronary artery disease)  - no sx's even with anemia   Gout  -Despite continued Uloric and  Colcichine patient was having significant pain in his knees and feet - this has previously responded to a short course of prednisone - initiated prednisone at first dose of 40mg  x1 with good response - pt to finish very rapid taper as detailed in d/c meds - pt informed MD that he does NOT take colchicie/colcrys at home    Hyperlipidemia   Hypogonadism male  Resume home treatment regimen at d/c     Medication List    STOP taking these medications       COLCRYS 0.6 MG tablet  Generic drug:  colchicine      TAKE these medications       acetaminophen 500 MG tablet  Commonly known as:  TYLENOL  Take 500 mg by mouth every 6 (six) hours as needed. For pain     amLODipine 10 MG tablet  Commonly known as:  NORVASC  Take 10 mg by mouth daily.     amoxicillin 500 MG capsule  Commonly known as:  AMOXIL  Before dental appts.     b complex vitamins tablet  Take 1 tablet by mouth 2 (two) times daily.     cloNIDine 0.1 MG tablet  Commonly known as:  CATAPRES  Take 0.1 mg by mouth 2 (two) times daily.     cyclobenzaprine 10 MG tablet  Commonly known as:  FLEXERIL  Take 5 mg by mouth at bedtime as needed.     fondaparinux 10 MG/0.8ML Soln  Commonly known as:  ARIXTRA  Inject 0.8 mLs (10 mg total) into the skin daily.     FORTESTA 10 MG/ACT (2%) Gel  Generic drug:  Testosterone  APPLY 2 PUMPS TO THIGH ONCE DAILY     furosemide 20 MG tablet  Commonly known as:  LASIX  Take 20 mg by mouth daily. 4 times weekly. Tues thurs Saturday and Sunday     HYDROcodone-acetaminophen 5-500 MG per tablet  Commonly known as:  VICODIN  Take 1 tablet by mouth every 6 (six) hours as needed for pain.     labetalol 200 MG tablet  Commonly known as:  NORMODYNE  Continue to take 200mg  each AM, and 100mg  each PM as you were before your admission     metoprolol succinate 50 MG 24 hr tablet  Commonly known as:  TOPROL-XL  Take 1 tablet (50 mg total) by mouth daily.     MOVIPREP 100 G Solr   Generic drug:  peg 3350 powder     olmesartan 40 MG tablet  Commonly known as:  BENICAR  Take 1 tablet (40 mg total) by mouth daily.     pantoprazole 40 MG tablet  Commonly known as:  PROTONIX  Take 1 tablet (40 mg total) by mouth 2 (two) times daily.     predniSONE 10 MG tablet  Commonly known as:  DELTASONE  Take 20mg  once on 07/08/2012, then take 10mg  once on 07/09/2012, then stop taking prednisone     sildenafil 50 MG tablet  Commonly known as:  VIAGRA  Take 50 mg by mouth daily as needed.     ULORIC 40 MG tablet  Generic drug:  febuxostat  Take 80 mg by mouth daily.     warfarin 7.5 MG tablet  Commonly known as:  COUMADIN  Take by mouth daily.        Day of Discharge BP 128/52  Pulse 65  Temp(Src) 97.5 F (36.4 C) (Oral)  Resp 13  Ht 6\' 2"  (1.88 m)  Wt 137 kg (302 lb 0.5 oz)  BMI 38.76 kg/m2  SpO2 97%  Physical Exam: General: No acute respiratory distress Lungs: Clear to auscultation bilaterally without wheezes or crackles Cardiovascular: Regular rate and rhythm without murmur gallop or rub normal S1 and S2 Abdomen: Nontender, nondistended, soft, bowel sounds positive, no rebound, no ascites, no appreciable mass Extremities: No significant cyanosis, clubbing;  1+ edema bilateral lower extremities without erythema of knees or ankles  Results for orders placed during the hospital encounter of 07/01/12 (from the past 24 hour(s))  PROTIME-INR     Status: Abnormal   Collection Time    07/07/12  6:15 AM      Result Value Range   Prothrombin Time 22.8 (*) 11.6 - 15.2 seconds   INR 2.11 (*) 0.00 - 1.49  CBC     Status: Abnormal   Collection Time    07/07/12  6:15 AM      Result Value Range   WBC 6.7  4.0 - 10.5 K/uL   RBC 3.84 (*) 4.22 - 5.81 MIL/uL   Hemoglobin 9.8 (*) 13.0 - 17.0 g/dL   HCT 31.2 (*) 39.0 - 52.0 %   MCV 81.3  78.0 - 100.0 fL   MCH 25.5 (*) 26.0 - 34.0 pg   MCHC 31.4  30.0 - 36.0 g/dL   RDW 14.9  11.5 - 15.5 %   Platelets 289  150 - 400  K/uL    Time spent in discharge (includes decision making & examination of pt): >35 minutes  07/07/2012, 2:21 PM   Cherene Altes, MD [facility] Office  [phone] Pager [phone]  On-Call/Text Page:      [email or web address]      password [facility]

## 2012-07-08 ENCOUNTER — Encounter (HOSPITAL_COMMUNITY): Payer: Self-pay | Admitting: Gastroenterology

## 2012-07-10 ENCOUNTER — Ambulatory Visit (HOSPITAL_BASED_OUTPATIENT_CLINIC_OR_DEPARTMENT_OTHER): Payer: BC Managed Care – PPO | Admitting: Lab

## 2012-07-10 DIAGNOSIS — I4891 Unspecified atrial fibrillation: Secondary | ICD-10-CM

## 2012-07-10 DIAGNOSIS — D649 Anemia, unspecified: Secondary | ICD-10-CM

## 2012-07-10 DIAGNOSIS — Z7901 Long term (current) use of anticoagulants: Secondary | ICD-10-CM

## 2012-07-10 DIAGNOSIS — D631 Anemia in chronic kidney disease: Secondary | ICD-10-CM

## 2012-07-10 HISTORY — DX: Long term (current) use of anticoagulants: Z79.01

## 2012-07-10 LAB — CBC WITH DIFFERENTIAL (CANCER CENTER ONLY)
BASO%: 0.2 % (ref 0.0–2.0)
LYMPH#: 1.7 10*3/uL (ref 0.9–3.3)
LYMPH%: 28.1 % (ref 14.0–48.0)
MONO#: 0.4 10*3/uL (ref 0.1–0.9)
NEUT#: 3.8 10*3/uL (ref 1.5–6.5)
Platelets: 375 10*3/uL (ref 145–400)
RBC: 3.79 10*6/uL — ABNORMAL LOW (ref 4.20–5.70)
RDW: 14.9 % (ref 11.1–15.7)
WBC: 6 10*3/uL (ref 4.0–10.0)

## 2012-07-10 LAB — TECHNOLOGIST REVIEW CHCC SATELLITE

## 2012-07-10 LAB — PROTIME-INR (CHCC SATELLITE)
INR: 4.7 — ABNORMAL HIGH (ref 2.0–3.5)
Protime: 56.4 Seconds — ABNORMAL HIGH (ref 10.6–13.4)

## 2012-07-18 ENCOUNTER — Telehealth: Payer: Self-pay | Admitting: *Deleted

## 2012-07-18 NOTE — Telephone Encounter (Signed)
Called patient to let him know that the last time he was here his iron level was low and Dr. Niel Hummer wanted him to have Feraheme.  Patient said a lot has happened since then and has had 4 units of blood in hospital.  Patient has appt on 07/26/12 and can receive feraheme then or when iron studies come back per patient

## 2012-07-18 NOTE — Telephone Encounter (Signed)
Message copied by Rico Ala on Thu Jul 18, 2012 10:10 AM ------      Message from: [name]      Created: Thu Jul 11, 2012  4:25 PM       Call - iron is low!!  Needs FeraHeme 1020mg  x 1 dose!! Make sure he also gets a dose of Aranesp 300mg  1-2 weeks after Feraheme.  Pete ------

## 2012-07-26 ENCOUNTER — Ambulatory Visit (HOSPITAL_BASED_OUTPATIENT_CLINIC_OR_DEPARTMENT_OTHER): Payer: BC Managed Care – PPO | Admitting: Lab

## 2012-07-26 ENCOUNTER — Ambulatory Visit (HOSPITAL_BASED_OUTPATIENT_CLINIC_OR_DEPARTMENT_OTHER): Payer: BC Managed Care – PPO

## 2012-07-26 ENCOUNTER — Ambulatory Visit (HOSPITAL_BASED_OUTPATIENT_CLINIC_OR_DEPARTMENT_OTHER): Payer: BC Managed Care – PPO | Admitting: Hematology & Oncology

## 2012-07-26 DIAGNOSIS — D649 Anemia, unspecified: Secondary | ICD-10-CM

## 2012-07-26 DIAGNOSIS — C6290 Malignant neoplasm of unspecified testis, unspecified whether descended or undescended: Secondary | ICD-10-CM

## 2012-07-26 DIAGNOSIS — D509 Iron deficiency anemia, unspecified: Secondary | ICD-10-CM

## 2012-07-26 DIAGNOSIS — N189 Chronic kidney disease, unspecified: Secondary | ICD-10-CM

## 2012-07-26 DIAGNOSIS — C61 Malignant neoplasm of prostate: Secondary | ICD-10-CM

## 2012-07-26 DIAGNOSIS — Z952 Presence of prosthetic heart valve: Secondary | ICD-10-CM

## 2012-07-26 DIAGNOSIS — Z7901 Long term (current) use of anticoagulants: Secondary | ICD-10-CM

## 2012-07-26 DIAGNOSIS — C6292 Malignant neoplasm of left testis, unspecified whether descended or undescended: Secondary | ICD-10-CM

## 2012-07-26 DIAGNOSIS — D630 Anemia in neoplastic disease: Secondary | ICD-10-CM

## 2012-07-26 LAB — CBC WITH DIFFERENTIAL (CANCER CENTER ONLY)
BASO%: 0.7 % (ref 0.0–2.0)
EOS%: 3.3 % (ref 0.0–7.0)
HCT: 31.8 % — ABNORMAL LOW (ref 38.7–49.9)
LYMPH#: 1.1 10*3/uL (ref 0.9–3.3)
MCHC: 34 g/dL (ref 32.0–35.9)
NEUT%: 59.8 % (ref 40.0–80.0)
Platelets: 238 10*3/uL (ref 145–400)
RDW: 14.8 % (ref 11.1–15.7)

## 2012-07-26 LAB — PROTIME-INR (CHCC SATELLITE): INR: 2.5 (ref 2.0–3.5)

## 2012-07-26 LAB — IRON AND TIBC
TIBC: 301 ug/dL (ref 215–435)
UIBC: 205 ug/dL (ref 125–400)

## 2012-07-26 LAB — FERRITIN: Ferritin: 960 ng/mL — ABNORMAL HIGH (ref 22–322)

## 2012-07-26 MED ORDER — DARBEPOETIN ALFA-POLYSORBATE 200 MCG/0.4ML IJ SOLN
200.0000 ug | Freq: Once | INTRAMUSCULAR | Status: AC
  Administered 2012-07-26: 200 ug via SUBCUTANEOUS

## 2012-07-26 NOTE — Progress Notes (Signed)
CC:   Calvin Aver, MD  DIAGNOSES: 1. Anemia of renal insufficiency. 2. Recent upper gastrointestinal bleed. 3. Hypotestosteronemia. 4. 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 5. History of seminoma.  CURRENT THERAPY: 1. Aranesp 300 mcg subcu as needed for hemoglobin less than 11. 2. IV iron as indicated. 3. Lifelong Coumadin. 4. Topical testosterone replacement therapy.  INTERIM HISTORY:  Calvin [name] comes in for followup.  Unfortunately, since we last saw him, he had to be hospitalized.  He had a GI bleed.  This was after upper and lower endoscopies.  This was back in early March. He had been on the Arixtra as he is off Coumadin.  He subsequently developed melena.  His hemoglobin went down to 7.  He was admitted.  He got 4 units of blood.  I do not think he had any IV iron while in the hospital.  He is feeling better now.  Of note, he did have cardiac ablation for atrial flutter.  He is back to work.  He is traveling.  He is trying to exercise a little bit more.  PHYSICAL EXAMINATION:  General:  This is an obese white gentleman in no obvious distress.  Vital signs:  Temperature of 97.8, pulse 63, respiratory rate 18, blood pressure 119/49.  Weight is 302.  Head and neck:  Normocephalic, atraumatic skull.  There are no ocular or oral lesions.  There are no palpable cervical or supraclavicular lymph nodes. Lungs:  Clear bilaterally.  Cardiac:  Regular rate and rhythm with a normal S1 and S2.  He has a systolic click from his tricuspid prosthetic valve.  Abdomen:  Soft.  He has good bowel sounds.  There is no palpable abdominal mass.  There is no palpable hepatosplenomegaly.  Extremities: Some trace edema.  LABORATORY STUDIES:  White cell count 4.2, hemoglobin 10.8, hematocrit 31.8, platelet count 238.  MCV is 82.  IMPRESSION:  Calvin [name] is a 57 year old gentleman with multifactorial anemia.  We will go ahead and give him Aranesp today.  Of  note, he last got iron back in July 2013.  We will see what his iron studies are today.  It is possible that he may need to have a little bit of IV iron.  He usually dictates when he comes in for his lab work.  Hopefully, this will continue to be set up for him.  I will plan see him back myself probably in about 3 months or so.    ______________________________ Volanda Napoleon, M.D. PRE/MEDQ  D:  07/26/2012  T:  07/26/2012  Job:  [id number]

## 2012-07-26 NOTE — Patient Instructions (Signed)
Darbepoetin Alfa injection What is this medicine? DARBEPOETIN ALFA (dar be POE e tin AL fa) helps your body make more red blood cells. It is used to treat anemia caused by chronic kidney failure and chemotherapy. This medicine may be used for other purposes; ask your health care provider or pharmacist if you have questions. What should I tell my health care provider before I take this medicine? They need to know if you have any of these conditions: -blood clotting disorders or history of blood clots -cancer patient not on chemotherapy -cystic fibrosis -heart disease, such as angina, heart failure, or a history of a heart attack -hemoglobin level of 12 g/dL or greater -high blood pressure -low levels of folate, iron, or vitamin B12 -seizures -an unusual or allergic reaction to darbepoetin, erythropoietin, albumin, hamster proteins, latex, other medicines, foods, dyes, or preservatives -pregnant or trying to get pregnant -breast-feeding How should I use this medicine? This medicine is for injection into a vein or under the skin. It is usually given by a health care professional in a hospital or clinic setting. If you get this medicine at home, you will be taught how to prepare and give this medicine. Do not shake the solution before you withdraw a dose. Use exactly as directed. Take your medicine at regular intervals. Do not take your medicine more often than directed. It is important that you put your used needles and syringes in a special sharps container. Do not put them in a trash can. If you do not have a sharps container, call your pharmacist or healthcare provider to get one. Talk to your pediatrician regarding the use of this medicine in children. While this medicine may be used in children as young as 1 year for selected conditions, precautions do apply. Overdosage: If you think you have taken too much of this medicine contact a poison control center or emergency room at once. NOTE:  This medicine is only for you. Do not share this medicine with others. What if I miss a dose? If you miss a dose, take it as soon as you can. If it is almost time for your next dose, take only that dose. Do not take double or extra doses. What may interact with this medicine? Do not take this medicine with any of the following medications: -epoetin alfa This list may not describe all possible interactions. Give your health care provider a list of all the medicines, herbs, non-prescription drugs, or dietary supplements you use. Also tell them if you smoke, drink alcohol, or use illegal drugs. Some items may interact with your medicine. What should I watch for while using this medicine? Visit your prescriber or health care professional for regular checks on your progress and for the needed blood tests and blood pressure measurements. It is especially important for the doctor to make sure your hemoglobin level is in the desired range, to limit the risk of potential side effects and to give you the best benefit. Keep all appointments for any recommended tests. Check your blood pressure as directed. Ask your doctor what your blood pressure should be and when you should contact him or her. As your body makes more red blood cells, you may need to take iron, folic acid, or vitamin B supplements. Ask your doctor or health care provider which products are right for you. If you have kidney disease continue dietary restrictions, even though this medication can make you feel better. Talk with your doctor or health care professional about the   foods you eat and the vitamins that you take. What side effects may I notice from receiving this medicine? Side effects that you should report to your doctor or health care professional as soon as possible: -allergic reactions like skin rash, itching or hives, swelling of the face, lips, or tongue -breathing problems -changes in vision -chest pain -confusion, trouble speaking  or understanding -feeling faint or lightheaded, falls -high blood pressure -muscle aches or pains -pain, swelling, warmth in the leg -rapid weight gain -severe headaches -sudden numbness or weakness of the face, arm or leg -trouble walking, dizziness, loss of balance or coordination -seizures (convulsions) -swelling of the ankles, feet, hands -unusually weak or tired Side effects that usually do not require medical attention (report to your doctor or health care professional if they continue or are bothersome): -diarrhea -fever, chills (flu-like symptoms) -headaches -nausea, vomiting -redness, stinging, or swelling at site where injected This list may not describe all possible side effects. Call your doctor for medical advice about side effects. You may report side effects to FDA at 1-800-FDA-1088. Where should I keep my medicine? Keep out of the reach of children. Store in a refrigerator between 2 and 8 degrees C (36 and 46 degrees F). Do not freeze. Do not shake. Throw away any unused portion if using a single-dose vial. Throw away any unused medicine after the expiration date. NOTE: This sheet is a summary. It may not cover all possible information. If you have questions about this medicine, talk to your doctor, pharmacist, or health care provider.  2013, [employer]. (03/31/2008 10:23:57 AM)

## 2012-07-26 NOTE — Addendum Note (Signed)
Addended by: [name] on: 07/26/2012 11:40 AM   Modules accepted: Orders, SmartSet

## 2012-07-26 NOTE — Progress Notes (Signed)
This office note has been dictated.

## 2012-07-29 ENCOUNTER — Encounter: Payer: Self-pay | Admitting: *Deleted

## 2012-08-06 ENCOUNTER — Other Ambulatory Visit: Payer: Self-pay | Admitting: *Deleted

## 2012-08-06 MED ORDER — OLMESARTAN MEDOXOMIL 40 MG PO TABS: 40.0000 mg | ORAL_TABLET | Freq: Every day | ORAL | Status: DC

## 2012-08-26 ENCOUNTER — Other Ambulatory Visit (HOSPITAL_BASED_OUTPATIENT_CLINIC_OR_DEPARTMENT_OTHER): Payer: BC Managed Care – PPO | Admitting: Lab

## 2012-08-26 DIAGNOSIS — N189 Chronic kidney disease, unspecified: Secondary | ICD-10-CM

## 2012-08-26 DIAGNOSIS — I4891 Unspecified atrial fibrillation: Secondary | ICD-10-CM

## 2012-08-26 DIAGNOSIS — D649 Anemia, unspecified: Secondary | ICD-10-CM

## 2012-08-26 LAB — CBC WITH DIFFERENTIAL (CANCER CENTER ONLY)
BASO%: 0.6 % (ref 0.0–2.0)
LYMPH#: 1 10*3/uL (ref 0.9–3.3)
MONO#: 0.5 10*3/uL (ref 0.1–0.9)
Platelets: 261 10*3/uL (ref 145–400)
RDW: 15.2 % (ref 11.1–15.7)
WBC: 5.4 10*3/uL (ref 4.0–10.0)

## 2012-08-26 LAB — PROTIME-INR (CHCC SATELLITE): Protime: 26.4 Seconds — ABNORMAL HIGH (ref 10.6–13.4)

## 2012-09-04 ENCOUNTER — Encounter: Payer: Self-pay | Admitting: Cardiology

## 2012-09-04 ENCOUNTER — Ambulatory Visit (INDEPENDENT_AMBULATORY_CARE_PROVIDER_SITE_OTHER): Payer: BC Managed Care – PPO | Admitting: Cardiology

## 2012-09-04 VITALS — BP 160/80 | HR 61 | Wt 305.0 lb

## 2012-09-04 DIAGNOSIS — I4891 Unspecified atrial fibrillation: Secondary | ICD-10-CM

## 2012-09-04 DIAGNOSIS — Q251 Coarctation of aorta: Secondary | ICD-10-CM

## 2012-09-04 DIAGNOSIS — Z954 Presence of other heart-valve replacement: Secondary | ICD-10-CM

## 2012-09-04 DIAGNOSIS — I10 Essential (primary) hypertension: Secondary | ICD-10-CM

## 2012-09-04 DIAGNOSIS — Z952 Presence of prosthetic heart valve: Secondary | ICD-10-CM

## 2012-09-04 DIAGNOSIS — I712 Thoracic aortic aneurysm, without rupture: Secondary | ICD-10-CM

## 2012-09-04 NOTE — Assessment & Plan Note (Signed)
Blood pressure is mildly elevated he follows this closely at home. Typically his systolic is [id number] or less. Continue to monitor and increase medications as needed.

## 2012-09-04 NOTE — Assessment & Plan Note (Signed)
Continue SBE prophylaxis. 

## 2012-09-04 NOTE — Assessment & Plan Note (Signed)
Followed by nephrology. 

## 2012-09-04 NOTE — Assessment & Plan Note (Signed)
Patient scheduled to followup at [facility] in July. He will most likely need followup imaging in the near future.

## 2012-09-04 NOTE — Patient Instructions (Addendum)
Your physician wants you to follow-up in: [place] will receive a reminder letter in the mail two months in advance. If you don't receive a letter, please call our office to schedule the follow-up appointment.

## 2012-09-04 NOTE — Assessment & Plan Note (Signed)
Patient remains in sinus rhythm today. Continue beta blocker and Coumadin.

## 2012-09-04 NOTE — Assessment & Plan Note (Signed)
Most recent echocardiogram showed a peak gradient in the descending aorta a 49 mm of mercury. He is scheduled to be seen at the [facility] clinic in followup in July. I have asked him to discuss further imaging with Dr. Laurann Montana. CTA and MRA will be somewhat problematic due to renal insufficiency.

## 2012-09-04 NOTE — Progress Notes (Signed)
HPI: Pleasant male with past medical history of paroxysmal atrial fibrillation/flutter, history of aortic valve replacement, aortic coarctation, aortic aneurysm, coronary artery disease on CT scan and renal insufficiency for fu. Patient is s/p coarctation repair in 1958 and redo in 1971. Had endovascular stent in 1995 and 1996. Had aortic balloon valvuloplasty in 1974 and AVR in 1994. 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 Stent in distal arch, proximal descending aorta patent. Mildly enlarged lymph nodes. He is seen at the [facility] clinic on a yearly basis for followup of above. Patient also has atrial flutter. We cardioverted him previously and his dyspnea on exertion improved. Echocardiogram in December of 2013 showed normal LV function. There was a mechanical aortic valve with a mean gradient of 19 mm of mercury. The ascending aorta was 49 mm and there was moderate left atrial enlargement and there was a gradient in the descending aorta with a peak of 49 mm of mercury. Patient was admitted in March with GI bleeding following a polypectomy and required transfusion. Since he was last seen, he denies dyspnea, chest pain, palpitations or syncope. Mild pedal edema.   Current Outpatient Prescriptions  Medication Sig Dispense Refill  . acetaminophen (TYLENOL) 500 MG tablet Take 500 mg by mouth every 6 (six) hours as needed. For pain      . amLODipine (NORVASC) 10 MG tablet Take 10 mg by mouth daily.       Marland Kitchen amoxicillin (AMOXIL) 500 MG capsule Before dental appts.      Marland Kitchen b complex vitamins tablet Take 1 tablet by mouth 2 (two) times daily.       . cloNIDine (CATAPRES) 0.1 MG tablet Take 0.1 mg by mouth 2 (two) times daily.       . cyclobenzaprine (FLEXERIL) 10 MG tablet Take 5 mg by mouth at bedtime as needed.       Marland Kitchen FORTESTA 10 MG/ACT (2%) GEL APPLY 2 PUMPS TO THIGH ONCE DAILY  60 g  3  . furosemide (LASIX) 20 MG tablet Take 20 mg by mouth daily. 4 times weekly.  Tues thurs Saturday and Sunday      . HYDROcodone-acetaminophen (VICODIN) 5-500 MG per tablet Take 1 tablet by mouth every 6 (six) hours as needed for pain.  30 tablet  2  . labetalol (NORMODYNE) 200 MG tablet 2 tabs am 1 tab pm      . metoprolol succinate (TOPROL-XL) 50 MG 24 hr tablet Take 1 tablet (50 mg total) by mouth daily.      Marland Kitchen olmesartan (BENICAR) 40 MG tablet Take 1 tablet (40 mg total) by mouth daily.  90 tablet  3  . pantoprazole (PROTONIX) 40 MG tablet Take 40 mg by mouth daily.       . predniSONE (DELTASONE) 10 MG tablet prn      . sildenafil (VIAGRA) 50 MG tablet Take 50 mg by mouth daily as needed.        Marland Kitchen ULORIC 40 MG tablet Take 80 mg by mouth daily.       Marland Kitchen warfarin (COUMADIN) 7.5 MG tablet Take by mouth daily.        No current facility-administered medications for this visit.     Past Medical History  Diagnosis Date  . 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 HTN (hypertension)   . CAD (coronary artery disease)   . Aortic aneurysm   . Gout   . Atrial fibrillation   .  Anemia   . Hyperlipidemia   . Atrial flutter   . Obesity   . Anemia of renal disease 10/04/2011  . CKD (chronic kidney disease)     baseline creatinine is 1.5-2  . Gastric polyp 2007, 06/2012    benign.  . Colonic ulcer 2007    felt to be secondary to NSAID  . PONV (postoperative nausea and vomiting)   . Testicular adenoma     Past Surgical History  Procedure Laterality Date  . Coarctation of aorta repair  1958  . Coarctation of aorta repair  1970  . Aortic valve replacement and mitral valve repair  1974    Bicuspid aortic valve  . Orchiectomy  1992    testicular cancer  . Aortic valve replacement  1995    [facility]  . Tonsillectomy    . Cardioversion  02/27/2012    Procedure: CARDIOVERSION;  Surgeon: Lelon Perla, MD;  Location: [facility] ENDOSCOPY;  Service: Cardiovascular;  Laterality: N/A;  . Esophagogastroduodenoscopy N/A 07/04/2012    Procedure:  ESOPHAGOGASTRODUODENOSCOPY (EGD);  Surgeon: Inda Castle, MD;  Location: [place] [facility];  Service: Endoscopy;  Laterality: N/A;    History   Social History  . Marital Status: Married    Spouse Name: N/A    Number of Children: 4  . Years of Education: N/A   Occupational History  . Not on file.   Social History Main Topics  . Smoking status: Former Research scientist (life sciences)  . Smokeless tobacco: Never Used  . Alcohol Use: Yes     Comment: wine, occasionally  . Drug Use: No  . Sexually Active: Not on file   Other Topics Concern  . Not on file   Social History Narrative   Pt lives in [place] with wife.  3 children ages 45,20,22 (all 3 are adopted)   Sales grinding wheels.    ROS: no fevers or chills, productive cough, hemoptysis, dysphasia, odynophagia, melena, hematochezia, dysuria, hematuria, rash, seizure activity, orthopnea, PND, claudication. Remaining systems are negative.  Physical Exam: Well-developed well-nourished in no acute distress.  Skin is warm and dry.  HEENT is normal.  Neck is supple.  Chest is clear to auscultation with normal expansion.  Cardiovascular exam is regular rate and rhythm. Crisp mechanical valve sounds with 2/6 systolic murmur left sternal border. Abdominal exam nontender or distended. No masses palpated. Extremities show trace to 1+ edema. neuro grossly intact  ECG sinus rhythm with first degree AV block. Normal axis. IVCD. Lateral T-wave inversion.

## 2012-09-13 ENCOUNTER — Telehealth: Payer: Self-pay | Admitting: Cardiology

## 2012-09-13 NOTE — Telephone Encounter (Signed)
Dr Stanford Breed made aware, no change at this time

## 2012-09-13 NOTE — Telephone Encounter (Signed)
New Problem:   Patient had another Dr.'s appt and his BP was 132/62.  No need to call back.

## 2012-09-25 ENCOUNTER — Telehealth: Payer: Self-pay | Admitting: Hematology & Oncology

## 2012-09-25 NOTE — Telephone Encounter (Signed)
Pt moved 6-27 to 6-25 he is going out of town

## 2012-09-27 ENCOUNTER — Ambulatory Visit (HOSPITAL_BASED_OUTPATIENT_CLINIC_OR_DEPARTMENT_OTHER): Payer: BC Managed Care – PPO | Admitting: Lab

## 2012-09-27 DIAGNOSIS — N189 Chronic kidney disease, unspecified: Secondary | ICD-10-CM

## 2012-09-27 DIAGNOSIS — D649 Anemia, unspecified: Secondary | ICD-10-CM

## 2012-09-27 DIAGNOSIS — I4891 Unspecified atrial fibrillation: Secondary | ICD-10-CM

## 2012-09-27 LAB — CBC WITH DIFFERENTIAL (CANCER CENTER ONLY)
BASO%: 0.4 % (ref 0.0–2.0)
LYMPH#: 1.3 10*3/uL (ref 0.9–3.3)
LYMPH%: 22.6 % (ref 14.0–48.0)
MONO#: 0.5 10*3/uL (ref 0.1–0.9)
Platelets: 265 10*3/uL (ref 145–400)
RDW: 15.5 % (ref 11.1–15.7)
WBC: 5.6 10*3/uL (ref 4.0–10.0)

## 2012-09-27 LAB — PROTIME-INR (CHCC SATELLITE): Protime: 22.8 Seconds — ABNORMAL HIGH (ref 10.6–13.4)

## 2012-10-14 ENCOUNTER — Other Ambulatory Visit: Payer: Self-pay | Admitting: Hematology & Oncology

## 2012-10-23 ENCOUNTER — Ambulatory Visit (HOSPITAL_BASED_OUTPATIENT_CLINIC_OR_DEPARTMENT_OTHER): Payer: BC Managed Care – PPO | Admitting: Hematology & Oncology

## 2012-10-23 ENCOUNTER — Other Ambulatory Visit (HOSPITAL_BASED_OUTPATIENT_CLINIC_OR_DEPARTMENT_OTHER): Payer: BC Managed Care – PPO | Admitting: Lab

## 2012-10-23 ENCOUNTER — Ambulatory Visit: Payer: BC Managed Care – PPO

## 2012-10-23 VITALS — BP 125/50 | HR 65 | Temp 98.1°F | Resp 18 | Ht 74.0 in | Wt 307.0 lb

## 2012-10-23 DIAGNOSIS — D509 Iron deficiency anemia, unspecified: Secondary | ICD-10-CM

## 2012-10-23 DIAGNOSIS — D630 Anemia in neoplastic disease: Secondary | ICD-10-CM

## 2012-10-23 DIAGNOSIS — Z7901 Long term (current) use of anticoagulants: Secondary | ICD-10-CM

## 2012-10-23 DIAGNOSIS — C61 Malignant neoplasm of prostate: Secondary | ICD-10-CM

## 2012-10-23 DIAGNOSIS — D631 Anemia in chronic kidney disease: Secondary | ICD-10-CM

## 2012-10-23 DIAGNOSIS — C6292 Malignant neoplasm of left testis, unspecified whether descended or undescended: Secondary | ICD-10-CM

## 2012-10-23 DIAGNOSIS — N039 Chronic nephritic syndrome with unspecified morphologic changes: Secondary | ICD-10-CM

## 2012-10-23 DIAGNOSIS — C6290 Malignant neoplasm of unspecified testis, unspecified whether descended or undescended: Secondary | ICD-10-CM

## 2012-10-23 LAB — CBC WITH DIFFERENTIAL (CANCER CENTER ONLY)
BASO%: 0.6 % (ref 0.0–2.0)
EOS%: 2.9 % (ref 0.0–7.0)
LYMPH%: 24.4 % (ref 14.0–48.0)
MCHC: 34.7 g/dL (ref 32.0–35.9)
MCV: 82 fL (ref 82–98)
MONO#: 0.4 10*3/uL (ref 0.1–0.9)
Platelets: 247 10*3/uL (ref 145–400)
RDW: 15.2 % (ref 11.1–15.7)
WBC: 5.2 10*3/uL (ref 4.0–10.0)

## 2012-10-23 LAB — CMP (CANCER CENTER ONLY)
Albumin: 3.9 g/dL (ref 3.3–5.5)
Alkaline Phosphatase: 45 U/L (ref 26–84)
BUN, Bld: 38 mg/dL — ABNORMAL HIGH (ref 7–22)
Glucose, Bld: 109 mg/dL (ref 73–118)
Total Bilirubin: 0.6 mg/dl (ref 0.20–1.60)

## 2012-10-23 LAB — PROTIME-INR (CHCC SATELLITE): Protime: 22.8 Seconds — ABNORMAL HIGH (ref 10.6–13.4)

## 2012-10-23 LAB — FERRITIN: Ferritin: 742 ng/mL — ABNORMAL HIGH (ref 22–322)

## 2012-10-23 LAB — RETICULOCYTES (CHCC): Retic Ct Pct: 1.1 % (ref 0.4–2.3)

## 2012-10-23 NOTE — Progress Notes (Signed)
This office note has been dictated.

## 2012-10-25 ENCOUNTER — Other Ambulatory Visit: Payer: BC Managed Care – PPO | Admitting: Lab

## 2012-10-25 ENCOUNTER — Ambulatory Visit: Payer: BC Managed Care – PPO | Admitting: Hematology & Oncology

## 2012-10-25 ENCOUNTER — Ambulatory Visit (HOSPITAL_BASED_OUTPATIENT_CLINIC_OR_DEPARTMENT_OTHER): Payer: BC Managed Care – PPO

## 2012-10-25 ENCOUNTER — Other Ambulatory Visit: Payer: Self-pay | Admitting: Oncology

## 2012-10-25 ENCOUNTER — Ambulatory Visit: Payer: BC Managed Care – PPO

## 2012-10-25 DIAGNOSIS — D509 Iron deficiency anemia, unspecified: Secondary | ICD-10-CM

## 2012-10-25 MED ORDER — SODIUM CHLORIDE 0.9 % IV SOLN
Freq: Once | INTRAVENOUS | Status: AC
  Administered 2012-10-25: 15:00:00 via INTRAVENOUS

## 2012-10-25 MED ORDER — SODIUM CHLORIDE 0.9 % IV SOLN
1020.0000 mg | Freq: Once | INTRAVENOUS | Status: AC
  Administered 2012-10-25: 1020 mg via INTRAVENOUS
  Filled 2012-10-25: qty 34

## 2012-10-25 NOTE — Progress Notes (Signed)
DIAGNOSES: 1. Anemia of renal insufficiency. 2. Intermittent iron deficiency anemia. 3. 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 4. 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  CURRENT THERAPY: 1. IV iron as indicated. 2. Aranesp 300 mcg subcu as needed for hemoglobin less than 11. 3. Topical testosterone. 4. Coumadin to maintain INR between 2 to 3.  INTERIM HISTORY:  Mr. Tambe comes in for followup.  She actually is doing pretty well.  We last saw him back in March.  He is getting ready go on his family vacation.  They go to the mid [place] for a few weeks.  They drive around and they have a good time. When we last saw him back in March, his iron studies showed a ferritin of __________ with iron saturation of 32%.  His iron was 96%.  His last Shirlean Kelly was given almost a year ago in July.  He is using [place] for topical testosterone replacement.  He says that he does not think this helping him all that much.  His last testosterone level back in March was 612.  He has had no bleeding.  He is still not exercising all that much.  He has still not lost a lot of weight.  He is going up to the [facility] for his annual cardiology check I think next month.  PHYSICAL EXAMINATION:  General:  This is an obese white gentleman in no obvious distress.  Vital signs:  Temperature of 98.1, pulse 65, respiratory rate 18, blood pressure 125/50.  Weight is 307.  Head and neck:  Normocephalic, atraumatic skull.  There are no ocular or oral lesions.  There are no palpable cervical or supraclavicular lymph nodes. Lungs:  Clear bilaterally.  Cardiac:  Regular rate and rhythm with a normal S1, S2.  He has a systolic click secondary to the prostatic valve.  Abdomen:  Soft with good bowel sounds.  There is no fluid wave. There is no palpable abdominal mass.  There is no palpable hepatosplenomegaly.  Extremities:  No clubbing, cyanosis, or edema. Neurological:  Shows no focal  neurological deficits.  Skin:  No rashes, ecchymoses, or petechia.  LABORATORY STUDIES:  White cell count is 5.2, hemoglobin 11.4, hematocrit 32.8, platelet count 247,000.  Ferritin 742 with an iron saturation of 19%.  BUN is 38 and creatinine 2.3.  Testosterone is 529.  INR is 1.9.  IMPRESSION:  Mr. Hafez is a 57 year old gentleman with multifactorial anemia.  I think he is probably going to need IV iron.  We will see if we cannot get him in before he goes on his vacation.  If not, we can treat him afterwards.  I would not mess with his Coumadin dose.  I think he is okay with an INR 1.9.  He does not need any Aranesp.  We are checking his labs every 3 to 4 weeks.  I will plan to see him back myself in another 2 to 3 months.    ______________________________ Volanda Napoleon, M.D. PRE/MEDQ  D:  10/24/2012  T:  10/25/2012  Job:  [id number]

## 2012-10-25 NOTE — Patient Instructions (Signed)
Ferumoxytol injection What is this medicine? FERUMOXYTOL is an iron complex. Iron is used to make healthy red blood cells, which carry oxygen and nutrients throughout the body. This medicine is used to treat iron deficiency anemia in people with chronic kidney disease. This medicine may be used for other purposes; ask your health care provider or pharmacist if you have questions. What should I tell my health care provider before I take this medicine? They need to know if you have any of these conditions: -anemia not caused by low iron levels -high levels of iron in the blood -magnetic resonance imaging (MRI) test scheduled -an unusual or allergic reaction to iron, other medicines, foods, dyes, or preservatives -pregnant or trying to get pregnant -breast-feeding How should I use this medicine? This medicine is for infusion into a vein. It is given by a health care professional in a hospital or clinic setting. Talk to your pediatrician regarding the use of this medicine in children. Special care may be needed. Overdosage: If you think you've taken too much of this medicine contact a poison control center or emergency room at once. Overdosage: If you think you have taken too much of this medicine contact a poison control center or emergency room at once. NOTE: This medicine is only for you. Do not share this medicine with others. What if I miss a dose? It is important not to miss your dose. Call your doctor or health care professional if you are unable to keep an appointment. What may interact with this medicine? This medicine may interact with the following medications: -other iron products This list may not describe all possible interactions. Give your health care provider a list of all the medicines, herbs, non-prescription drugs, or dietary supplements you use. Also tell them if you smoke, drink alcohol, or use illegal drugs. Some items may interact with your medicine. What should I watch  for while using this medicine? Visit your doctor or healthcare professional regularly. Tell your doctor or healthcare professional if your symptoms do not start to get better or if they get worse. You may need blood work done while you are taking this medicine. You may need to follow a special diet. Talk to your doctor. Foods that contain iron include: whole grains/cereals, dried fruits, beans, or peas, leafy green vegetables, and organ meats (liver, kidney). What side effects may I notice from receiving this medicine? Side effects that you should report to your doctor or health care professional as soon as possible: -allergic reactions like skin rash, itching or hives, swelling of the face, lips, or tongue -breathing problems -changes in blood pressure -feeling faint or lightheaded, falls -fever or chills -flushing, sweating, or hot feelings -swelling of the ankles or feet Side effects that usually do not require medical attention (Report these to your doctor or health care professional if they continue or are bothersome.): -diarrhea -headache -nausea, vomiting -stomach pain This list may not describe all possible side effects. Call your doctor for medical advice about side effects. You may report side effects to FDA at 1-800-FDA-1088. Where should I keep my medicine? This drug is given in a hospital or clinic and will not be stored at home. NOTE: This sheet is a summary. It may not cover all possible information. If you have questions about this medicine, talk to your doctor, pharmacist, or health care provider.  2013, Elsevier/Gold Standard. (01/08/2008 9:48:25 PM)  

## 2012-11-12 ENCOUNTER — Ambulatory Visit: Payer: BC Managed Care – PPO | Admitting: Internal Medicine

## 2012-11-12 ENCOUNTER — Encounter: Payer: Self-pay | Admitting: Internal Medicine

## 2012-11-12 ENCOUNTER — Ambulatory Visit (INDEPENDENT_AMBULATORY_CARE_PROVIDER_SITE_OTHER): Payer: BC Managed Care – PPO | Admitting: Internal Medicine

## 2012-11-12 ENCOUNTER — Ambulatory Visit (HOSPITAL_BASED_OUTPATIENT_CLINIC_OR_DEPARTMENT_OTHER)
Admission: RE | Admit: 2012-11-12 | Discharge: 2012-11-12 | Disposition: A | Discharge status: Home / Self Care | Payer: BC Managed Care – PPO | Source: Ambulatory Visit | Attending: Internal Medicine | Admitting: Internal Medicine

## 2012-11-12 VITALS — BP 154/70 | HR 78 | Temp 98.5°F | Wt 312.0 lb

## 2012-11-12 DIAGNOSIS — Z952 Presence of prosthetic heart valve: Secondary | ICD-10-CM

## 2012-11-12 DIAGNOSIS — R059 Cough, unspecified: Secondary | ICD-10-CM | POA: Insufficient documentation

## 2012-11-12 DIAGNOSIS — Z7901 Long term (current) use of anticoagulants: Secondary | ICD-10-CM

## 2012-11-12 DIAGNOSIS — J209 Acute bronchitis, unspecified: Secondary | ICD-10-CM

## 2012-11-12 DIAGNOSIS — Z954 Presence of other heart-valve replacement: Secondary | ICD-10-CM

## 2012-11-12 DIAGNOSIS — R05 Cough: Secondary | ICD-10-CM | POA: Insufficient documentation

## 2012-11-12 DIAGNOSIS — Z79899 Other long term (current) drug therapy: Secondary | ICD-10-CM

## 2012-11-12 DIAGNOSIS — N189 Chronic kidney disease, unspecified: Secondary | ICD-10-CM

## 2012-11-12 DIAGNOSIS — J984 Other disorders of lung: Secondary | ICD-10-CM | POA: Insufficient documentation

## 2012-11-12 DIAGNOSIS — D631 Anemia in chronic kidney disease: Secondary | ICD-10-CM

## 2012-11-12 LAB — CBC WITH DIFFERENTIAL/PLATELET
Basophils Relative: 0.1 % (ref 0.0–3.0)
HCT: 28.6 % — ABNORMAL LOW (ref 39.0–52.0)
Hemoglobin: 9.7 g/dL — ABNORMAL LOW (ref 13.0–17.0)
Lymphocytes Relative: 9.4 % — ABNORMAL LOW (ref 12.0–46.0)
Lymphs Abs: 0.7 10*3/uL (ref 0.7–4.0)
Monocytes Relative: 8 % (ref 3.0–12.0)
Neutro Abs: 5.9 10*3/uL (ref 1.4–7.7)
RBC: 3.35 Mil/uL — ABNORMAL LOW (ref 4.22–5.81)

## 2012-11-12 LAB — BASIC METABOLIC PANEL
BUN: 47 mg/dL — ABNORMAL HIGH (ref 6–23)
CO2: 24 mEq/L (ref 19–32)
Calcium: 8.9 mg/dL (ref 8.4–10.5)
Chloride: 105 mEq/L (ref 96–112)
Creatinine, Ser: 2.2 mg/dL — ABNORMAL HIGH (ref 0.4–1.5)
Glucose, Bld: 99 mg/dL (ref 70–99)

## 2012-11-12 MED ORDER — AMOXICILLIN-POT CLAVULANATE 875-125 MG PO TABS: 1.0000 | ORAL_TABLET | Freq: Two times a day (BID) | ORAL | Status: DC

## 2012-11-12 NOTE — Progress Notes (Signed)
  Subjective:    Patient ID: Calvin [name], male    DOB: [birth year], 56 y.o.   MRN: [id number]  HPI   Symptoms began 11/09/12 is fatigue associated with fever and chills. Temperature was high as 10 61F. He was in [place] and did not seek medical treatment  He did go to bed and took Tylenol and the generic cough suppressant/expectorant with some improvement. Cough has been nonproductive but associated with exertional dyspnea and subjective wheezing when supine. He has been sleeping in a chair for the last few days. He has had some yellow nasal discharge.  As of 7/14 he developed chest congestion. Temperature has continued to be 100-102 over the last 48 hours.  Significant past history includes pneumonia on 3 occasions. He believes he has had pneumonia vaccine  His past medical history was reviewed and corrected.  Echocardiogram and EKG were performed 10/31/12 at [facility]; these revealed no acute change I.    Review of Systems   He also describes anorexia over the last several days.  He denies frontal headache, facial pain, dental pain, otic pain, otic discharge.     Objective:   Physical Exam Gen.: Appears ill but adequately nourished in appearance. Alert, appropriate and cooperative throughout exam. Head: Normocephalic without obvious abnormalities Eyes: No corneal or conjunctival inflammation noted. No icterus Ears: External  ear exam reveals no significant lesions or deformities. Canals clear .TMs normal. Hearing is grossly normal bilaterally. Nose: External nasal exam reveals no deformity or inflammation. Nasal mucosa are pink and moist. No lesions or exudates noted.  Mouth: Oral mucosa and oropharynx reveal no lesions or exudates but erythema. Teeth in good repair. Neck: No deformities, masses, or tenderness noted. No NVD Lungs: Normal respiratory effort; chest expands symmetrically. Lungs are clear to auscultation without rales, wheezes, or increased work of breathing. DOE  moving from exam table Heart: Normal rate and rhythm. Normal S1 and S2. No gallop, click, or rub. Mechanical click ;Grade 2 systolic murmur. Abdomen: Bowel sounds normal; abdomen soft and nontender. No masses, organomegaly or hernias noted. No HJR                                 Musculoskeletal/extremities: No clubbing, cyanosis,  or significant extremity  deformity noted. Tone & strength  Normal. Joints normal. Nail health good. Able to lie down & sit up w/o help. Negative Homan's bilaterally Vascular: Carotid, radial artery,  pulses are full and equal. Decreased dorsalis pedis and  posterior tibial pulses.Bilateral carotid bruits present. Neurologic: Alert and oriented x3.         Skin: Intact without suspicious lesions or rashes. Shallow complexion Lymph: No cervical, axillary lymphadenopathy present. Psych: Mood and affect are normal. Normally interactive                                                                                        Assessment & Plan:  #1 fever with RTI symptoms . PMH of CAP X 3 #2 See Current Assessment & Plan in Problem List under specific Diagnosis

## 2012-11-12 NOTE — Patient Instructions (Addendum)
Order for x-rays entered into  the computer; these will be performed at [facility]. No appointment is necessary.   PT/INR, Current dose: 7.5 mg daily Except 11.025 mg on T/TH, per MD change to 1/2 tab today, 7.5 mg daily EXCEPT 11.025mg  on Wed.

## 2012-11-14 ENCOUNTER — Telehealth: Payer: Self-pay | Admitting: Hematology & Oncology

## 2012-11-14 ENCOUNTER — Ambulatory Visit (HOSPITAL_BASED_OUTPATIENT_CLINIC_OR_DEPARTMENT_OTHER): Payer: BC Managed Care – PPO

## 2012-11-14 ENCOUNTER — Other Ambulatory Visit: Payer: Self-pay | Admitting: *Deleted

## 2012-11-14 VITALS — BP 142/61 | HR 74 | Temp 97.2°F

## 2012-11-14 DIAGNOSIS — C61 Malignant neoplasm of prostate: Secondary | ICD-10-CM

## 2012-11-14 DIAGNOSIS — D631 Anemia in chronic kidney disease: Secondary | ICD-10-CM

## 2012-11-14 DIAGNOSIS — N189 Chronic kidney disease, unspecified: Secondary | ICD-10-CM

## 2012-11-14 MED ORDER — DARBEPOETIN ALFA-POLYSORBATE 200 MCG/0.4ML IJ SOLN
200.0000 ug | Freq: Once | INTRAMUSCULAR | Status: AC
  Administered 2012-11-14: 200 ug via SUBCUTANEOUS

## 2012-11-14 NOTE — Patient Instructions (Signed)
Darbepoetin Alfa injection What is this medicine? DARBEPOETIN ALFA (dar be POE e tin AL fa) helps your body make more red blood cells. It is used to treat anemia caused by chronic kidney failure and chemotherapy. This medicine may be used for other purposes; ask your health care provider or pharmacist if you have questions. What should I tell my health care provider before I take this medicine? They need to know if you have any of these conditions: -blood clotting disorders or history of blood clots -cancer patient not on chemotherapy -cystic fibrosis -heart disease, such as angina, heart failure, or a history of a heart attack -hemoglobin level of 12 g/dL or greater -high blood pressure -low levels of folate, iron, or vitamin B12 -seizures -an unusual or allergic reaction to darbepoetin, erythropoietin, albumin, hamster proteins, latex, other medicines, foods, dyes, or preservatives -pregnant or trying to get pregnant -breast-feeding How should I use this medicine? This medicine is for injection into a vein or under the skin. It is usually given by a health care professional in a hospital or clinic setting. If you get this medicine at home, you will be taught how to prepare and give this medicine. Do not shake the solution before you withdraw a dose. Use exactly as directed. Take your medicine at regular intervals. Do not take your medicine more often than directed. It is important that you put your used needles and syringes in a special sharps container. Do not put them in a trash can. If you do not have a sharps container, call your pharmacist or healthcare provider to get one. Talk to your pediatrician regarding the use of this medicine in children. While this medicine may be used in children as young as 1 year for selected conditions, precautions do apply. Overdosage: If you think you have taken too much of this medicine contact a poison control center or emergency room at once. NOTE:  This medicine is only for you. Do not share this medicine with others. What if I miss a dose? If you miss a dose, take it as soon as you can. If it is almost time for your next dose, take only that dose. Do not take double or extra doses. What may interact with this medicine? Do not take this medicine with any of the following medications: -epoetin alfa This list may not describe all possible interactions. Give your health care provider a list of all the medicines, herbs, non-prescription drugs, or dietary supplements you use. Also tell them if you smoke, drink alcohol, or use illegal drugs. Some items may interact with your medicine. What should I watch for while using this medicine? Visit your prescriber or health care professional for regular checks on your progress and for the needed blood tests and blood pressure measurements. It is especially important for the doctor to make sure your hemoglobin level is in the desired range, to limit the risk of potential side effects and to give you the best benefit. Keep all appointments for any recommended tests. Check your blood pressure as directed. Ask your doctor what your blood pressure should be and when you should contact him or her. As your body makes more red blood cells, you may need to take iron, folic acid, or vitamin B supplements. Ask your doctor or health care provider which products are right for you. If you have kidney disease continue dietary restrictions, even though this medication can make you feel better. Talk with your doctor or health care professional about the   foods you eat and the vitamins that you take. What side effects may I notice from receiving this medicine? Side effects that you should report to your doctor or health care professional as soon as possible: -allergic reactions like skin rash, itching or hives, swelling of the face, lips, or tongue -breathing problems -changes in vision -chest pain -confusion, trouble speaking  or understanding -feeling faint or lightheaded, falls -high blood pressure -muscle aches or pains -pain, swelling, warmth in the leg -rapid weight gain -severe headaches -sudden numbness or weakness of the face, arm or leg -trouble walking, dizziness, loss of balance or coordination -seizures (convulsions) -swelling of the ankles, feet, hands -unusually weak or tired Side effects that usually do not require medical attention (report to your doctor or health care professional if they continue or are bothersome): -diarrhea -fever, chills (flu-like symptoms) -headaches -nausea, vomiting -redness, stinging, or swelling at site where injected This list may not describe all possible side effects. Call your doctor for medical advice about side effects. You may report side effects to FDA at 1-800-FDA-1088. Where should I keep my medicine? Keep out of the reach of children. Store in a refrigerator between 2 and 8 degrees C (36 and 46 degrees F). Do not freeze. Do not shake. Throw away any unused portion if using a single-dose vial. Throw away any unused medicine after the expiration date. NOTE: This sheet is a summary. It may not cover all possible information. If you have questions about this medicine, talk to your doctor, pharmacist, or health care provider.  2013, [employer]. (03/31/2008 10:23:57 AM)

## 2012-11-14 NOTE — Progress Notes (Signed)
Pt called stating he went to his PCP yesterday. His HGB is 9.7 and needs an Aranesp injection. Said he has pneumonia. Scheduled to come in today at 2:30

## 2012-11-14 NOTE — Telephone Encounter (Signed)
Pt left message wanting inj said Hg is low. Left message with Amy RN

## 2012-11-19 ENCOUNTER — Telehealth: Payer: Self-pay | Admitting: Internal Medicine

## 2012-11-19 ENCOUNTER — Other Ambulatory Visit: Payer: Self-pay | Admitting: Internal Medicine

## 2012-11-19 DIAGNOSIS — R9389 Abnormal findings on diagnostic imaging of other specified body structures: Secondary | ICD-10-CM

## 2012-11-19 NOTE — Telephone Encounter (Signed)
Dr. Linna Darner: Did you want a repeat x-ray on this patient? GF/RN

## 2012-11-19 NOTE — Telephone Encounter (Signed)
Patient called stating he is due to have a follow up chest xray. I do not see where Dr. Linna Darner has recommended this. Please advise.

## 2012-11-19 NOTE — Telephone Encounter (Signed)
Order for x-rays entered into  the computer; these will be performed at [facility] No appointment is necessary.

## 2012-11-20 ENCOUNTER — Telehealth: Payer: Self-pay | Admitting: Hematology & Oncology

## 2012-11-20 NOTE — Telephone Encounter (Signed)
Pt left message to move 7-28 to 8-4 or 8-5. I left message with 8-4 lab

## 2012-11-21 ENCOUNTER — Ambulatory Visit (HOSPITAL_BASED_OUTPATIENT_CLINIC_OR_DEPARTMENT_OTHER)
Admission: RE | Admit: 2012-11-21 | Discharge: 2012-11-21 | Disposition: A | Discharge status: Home / Self Care | Payer: BC Managed Care – PPO | Source: Ambulatory Visit | Attending: Internal Medicine | Admitting: Internal Medicine

## 2012-11-21 DIAGNOSIS — R918 Other nonspecific abnormal finding of lung field: Secondary | ICD-10-CM | POA: Insufficient documentation

## 2012-11-21 DIAGNOSIS — R9389 Abnormal findings on diagnostic imaging of other specified body structures: Secondary | ICD-10-CM

## 2012-11-21 NOTE — Telephone Encounter (Signed)
Spoke with pt and advised of CXR results being normal

## 2012-11-21 NOTE — Telephone Encounter (Signed)
Patient is calling in regards to his x-ray results. He had his x-ray this morning but is traveling next week and wants to make sure he is okay to travel. Please advise.

## 2012-11-25 ENCOUNTER — Other Ambulatory Visit: Payer: BC Managed Care – PPO | Admitting: Lab

## 2012-12-02 ENCOUNTER — Other Ambulatory Visit (HOSPITAL_BASED_OUTPATIENT_CLINIC_OR_DEPARTMENT_OTHER): Payer: BC Managed Care – PPO | Admitting: Lab

## 2012-12-02 ENCOUNTER — Ambulatory Visit: Payer: BC Managed Care – PPO

## 2012-12-02 DIAGNOSIS — N189 Chronic kidney disease, unspecified: Secondary | ICD-10-CM

## 2012-12-02 DIAGNOSIS — D631 Anemia in chronic kidney disease: Secondary | ICD-10-CM

## 2012-12-02 DIAGNOSIS — I4891 Unspecified atrial fibrillation: Secondary | ICD-10-CM

## 2012-12-02 LAB — PROTIME-INR (CHCC SATELLITE): Protime: 45.6 Seconds — ABNORMAL HIGH (ref 10.6–13.4)

## 2012-12-02 LAB — CBC WITH DIFFERENTIAL (CANCER CENTER ONLY)
BASO%: 0.4 % (ref 0.0–2.0)
HCT: 31.5 % — ABNORMAL LOW (ref 38.7–49.9)
LYMPH%: 20.4 % (ref 14.0–48.0)
MCH: 28.5 pg (ref 28.0–33.4)
MCV: 86 fL (ref 82–98)
MONO%: 7.6 % (ref 0.0–13.0)
NEUT%: 68.7 % (ref 40.0–80.0)
Platelets: 299 10*3/uL (ref 145–400)
RDW: 14.8 % (ref 11.1–15.7)

## 2012-12-02 NOTE — Progress Notes (Signed)
This encounter was created in error. It is too soon to receive an Aranesp injection. Pt made aware and will call if he becomes symptomatic.

## 2012-12-18 ENCOUNTER — Telehealth: Payer: Self-pay | Admitting: Hematology & Oncology

## 2012-12-18 NOTE — Telephone Encounter (Signed)
Faxed Medical Records via fax today  to:  [facility] @ [address], [place]  [address]  Ph: [phone] Fx: [phone]   Medical  Records requested ALL to present   [place]

## 2012-12-27 ENCOUNTER — Ambulatory Visit (HOSPITAL_BASED_OUTPATIENT_CLINIC_OR_DEPARTMENT_OTHER): Payer: BC Managed Care – PPO

## 2012-12-27 ENCOUNTER — Ambulatory Visit (HOSPITAL_BASED_OUTPATIENT_CLINIC_OR_DEPARTMENT_OTHER): Payer: BC Managed Care – PPO | Admitting: Lab

## 2012-12-27 VITALS — BP 116/68 | HR 72 | Temp 97.8°F

## 2012-12-27 DIAGNOSIS — Z7901 Long term (current) use of anticoagulants: Secondary | ICD-10-CM

## 2012-12-27 DIAGNOSIS — C61 Malignant neoplasm of prostate: Secondary | ICD-10-CM

## 2012-12-27 DIAGNOSIS — D649 Anemia, unspecified: Secondary | ICD-10-CM

## 2012-12-27 DIAGNOSIS — N289 Disorder of kidney and ureter, unspecified: Secondary | ICD-10-CM

## 2012-12-27 DIAGNOSIS — D631 Anemia in chronic kidney disease: Secondary | ICD-10-CM

## 2012-12-27 DIAGNOSIS — I4891 Unspecified atrial fibrillation: Secondary | ICD-10-CM

## 2012-12-27 LAB — CBC WITH DIFFERENTIAL (CANCER CENTER ONLY)
BASO%: 0.2 % (ref 0.0–2.0)
EOS%: 2.4 % (ref 0.0–7.0)
HCT: 31.4 % — ABNORMAL LOW (ref 38.7–49.9)
LYMPH#: 1.4 10*3/uL (ref 0.9–3.3)
LYMPH%: 16.8 % (ref 14.0–48.0)
MCH: 29 pg (ref 28.0–33.4)
MCHC: 33.8 g/dL (ref 32.0–35.9)
MCV: 86 fL (ref 82–98)
MONO%: 7.9 % (ref 0.0–13.0)
NEUT%: 72.7 % (ref 40.0–80.0)
RDW: 14.5 % (ref 11.1–15.7)

## 2012-12-27 MED ORDER — DARBEPOETIN ALFA-POLYSORBATE 200 MCG/0.4ML IJ SOLN
200.0000 ug | Freq: Once | INTRAMUSCULAR | Status: AC
  Administered 2012-12-27: 200 ug via SUBCUTANEOUS

## 2012-12-27 NOTE — Patient Instructions (Signed)
Darbepoetin Alfa injection What is this medicine? DARBEPOETIN ALFA (dar be POE e tin AL fa) helps your body make more red blood cells. It is used to treat anemia caused by chronic kidney failure and chemotherapy. This medicine may be used for other purposes; ask your health care provider or pharmacist if you have questions. What should I tell my health care provider before I take this medicine? They need to know if you have any of these conditions: -blood clotting disorders or history of blood clots -cancer patient not on chemotherapy -cystic fibrosis -heart disease, such as angina, heart failure, or a history of a heart attack -hemoglobin level of 12 g/dL or greater -high blood pressure -low levels of folate, iron, or vitamin B12 -seizures -an unusual or allergic reaction to darbepoetin, erythropoietin, albumin, hamster proteins, latex, other medicines, foods, dyes, or preservatives -pregnant or trying to get pregnant -breast-feeding How should I use this medicine? This medicine is for injection into a vein or under the skin. It is usually given by a health care professional in a hospital or clinic setting. If you get this medicine at home, you will be taught how to prepare and give this medicine. Do not shake the solution before you withdraw a dose. Use exactly as directed. Take your medicine at regular intervals. Do not take your medicine more often than directed. It is important that you put your used needles and syringes in a special sharps container. Do not put them in a trash can. If you do not have a sharps container, call your pharmacist or healthcare provider to get one. Talk to your pediatrician regarding the use of this medicine in children. While this medicine may be used in children as young as 1 year for selected conditions, precautions do apply. Overdosage: If you think you have taken too much of this medicine contact a poison control center or emergency room at once. NOTE:  This medicine is only for you. Do not share this medicine with others. What if I miss a dose? If you miss a dose, take it as soon as you can. If it is almost time for your next dose, take only that dose. Do not take double or extra doses. What may interact with this medicine? Do not take this medicine with any of the following medications: -epoetin alfa This list may not describe all possible interactions. Give your health care provider a list of all the medicines, herbs, non-prescription drugs, or dietary supplements you use. Also tell them if you smoke, drink alcohol, or use illegal drugs. Some items may interact with your medicine. What should I watch for while using this medicine? Visit your prescriber or health care professional for regular checks on your progress and for the needed blood tests and blood pressure measurements. It is especially important for the doctor to make sure your hemoglobin level is in the desired range, to limit the risk of potential side effects and to give you the best benefit. Keep all appointments for any recommended tests. Check your blood pressure as directed. Ask your doctor what your blood pressure should be and when you should contact him or her. As your body makes more red blood cells, you may need to take iron, folic acid, or vitamin B supplements. Ask your doctor or health care provider which products are right for you. If you have kidney disease continue dietary restrictions, even though this medication can make you feel better. Talk with your doctor or health care professional about the   foods you eat and the vitamins that you take. What side effects may I notice from receiving this medicine? Side effects that you should report to your doctor or health care professional as soon as possible: -allergic reactions like skin rash, itching or hives, swelling of the face, lips, or tongue -breathing problems -changes in vision -chest pain -confusion, trouble speaking  or understanding -feeling faint or lightheaded, falls -high blood pressure -muscle aches or pains -pain, swelling, warmth in the leg -rapid weight gain -severe headaches -sudden numbness or weakness of the face, arm or leg -trouble walking, dizziness, loss of balance or coordination -seizures (convulsions) -swelling of the ankles, feet, hands -unusually weak or tired Side effects that usually do not require medical attention (report to your doctor or health care professional if they continue or are bothersome): -diarrhea -fever, chills (flu-like symptoms) -headaches -nausea, vomiting -redness, stinging, or swelling at site where injected This list may not describe all possible side effects. Call your doctor for medical advice about side effects. You may report side effects to FDA at 1-800-FDA-1088. Where should I keep my medicine? Keep out of the reach of children. Store in a refrigerator between 2 and 8 degrees C (36 and 46 degrees F). Do not freeze. Do not shake. Throw away any unused portion if using a single-dose vial. Throw away any unused medicine after the expiration date. NOTE: This sheet is a summary. It may not cover all possible information. If you have questions about this medicine, talk to your doctor, pharmacist, or health care provider.  2013, [employer]. (03/31/2008 10:23:57 AM)

## 2012-12-31 ENCOUNTER — Telehealth: Payer: Self-pay | Admitting: Cardiology

## 2012-12-31 DIAGNOSIS — I359 Nonrheumatic aortic valve disorder, unspecified: Secondary | ICD-10-CM

## 2012-12-31 NOTE — Telephone Encounter (Signed)
New Prob  Pt wants to know if he can have an echo done on 12.11 or 12.12, he is scheduled to see Dr Stanford Breed on 12.17.14.  I did not see an order for an echo but pt states he always has one done a week prior to see the doctor.

## 2012-12-31 NOTE — Telephone Encounter (Signed)
Spoke with pt, echo scheduled. 

## 2013-01-01 ENCOUNTER — Other Ambulatory Visit: Payer: Self-pay

## 2013-01-01 DIAGNOSIS — C61 Malignant neoplasm of prostate: Secondary | ICD-10-CM

## 2013-01-01 DIAGNOSIS — D509 Iron deficiency anemia, unspecified: Secondary | ICD-10-CM

## 2013-01-01 MED ORDER — LABETALOL HCL 200 MG PO TABS: ORAL_TABLET | ORAL | Status: DC

## 2013-01-01 MED ORDER — AMLODIPINE BESYLATE 10 MG PO TABS: 10.0000 mg | ORAL_TABLET | Freq: Every day | ORAL | Status: DC

## 2013-01-01 MED ORDER — METOPROLOL SUCCINATE ER 50 MG PO TB24: 50.0000 mg | ORAL_TABLET | Freq: Every day | ORAL | Status: DC

## 2013-01-03 ENCOUNTER — Other Ambulatory Visit: Payer: Self-pay | Admitting: *Deleted

## 2013-01-03 DIAGNOSIS — D509 Iron deficiency anemia, unspecified: Secondary | ICD-10-CM

## 2013-01-03 DIAGNOSIS — C6290 Malignant neoplasm of unspecified testis, unspecified whether descended or undescended: Secondary | ICD-10-CM

## 2013-01-03 DIAGNOSIS — C61 Malignant neoplasm of prostate: Secondary | ICD-10-CM

## 2013-01-03 MED ORDER — WARFARIN SODIUM 7.5 MG PO TABS: 7.5000 mg | ORAL_TABLET | Freq: Every day | ORAL | Status: DC

## 2013-01-06 ENCOUNTER — Other Ambulatory Visit: Payer: Self-pay | Admitting: *Deleted

## 2013-01-06 DIAGNOSIS — C61 Malignant neoplasm of prostate: Secondary | ICD-10-CM

## 2013-01-06 DIAGNOSIS — D509 Iron deficiency anemia, unspecified: Secondary | ICD-10-CM

## 2013-01-06 MED ORDER — CLONIDINE HCL 0.1 MG PO TABS: 0.1000 mg | ORAL_TABLET | Freq: Two times a day (BID) | ORAL | Status: DC

## 2013-01-09 ENCOUNTER — Other Ambulatory Visit: Payer: Self-pay | Admitting: Hematology & Oncology

## 2013-01-21 ENCOUNTER — Ambulatory Visit: Payer: BC Managed Care – PPO | Admitting: Internal Medicine

## 2013-01-25 ENCOUNTER — Other Ambulatory Visit: Payer: Self-pay | Admitting: Cardiology

## 2013-01-27 ENCOUNTER — Ambulatory Visit (HOSPITAL_BASED_OUTPATIENT_CLINIC_OR_DEPARTMENT_OTHER): Payer: BC Managed Care – PPO | Admitting: Hematology & Oncology

## 2013-01-27 ENCOUNTER — Other Ambulatory Visit (HOSPITAL_BASED_OUTPATIENT_CLINIC_OR_DEPARTMENT_OTHER): Payer: BC Managed Care – PPO | Admitting: Lab

## 2013-01-27 VITALS — BP 135/53 | HR 62 | Temp 98.4°F | Resp 18 | Ht 73.0 in | Wt 308.0 lb

## 2013-01-27 DIAGNOSIS — D631 Anemia in chronic kidney disease: Secondary | ICD-10-CM

## 2013-01-27 DIAGNOSIS — C61 Malignant neoplasm of prostate: Secondary | ICD-10-CM

## 2013-01-27 DIAGNOSIS — D509 Iron deficiency anemia, unspecified: Secondary | ICD-10-CM

## 2013-01-27 DIAGNOSIS — R109 Unspecified abdominal pain: Secondary | ICD-10-CM

## 2013-01-27 DIAGNOSIS — D630 Anemia in neoplastic disease: Secondary | ICD-10-CM

## 2013-01-27 DIAGNOSIS — R52 Pain, unspecified: Secondary | ICD-10-CM

## 2013-01-27 LAB — BASIC METABOLIC PANEL
BUN: 34 mg/dL — ABNORMAL HIGH (ref 6–23)
Calcium: 9.2 mg/dL (ref 8.4–10.5)
Glucose, Bld: 103 mg/dL — ABNORMAL HIGH (ref 70–99)

## 2013-01-27 LAB — CBC WITH DIFFERENTIAL (CANCER CENTER ONLY)
BASO%: 0.4 % (ref 0.0–2.0)
EOS%: 4.4 % (ref 0.0–7.0)
MCH: 28.8 pg (ref 28.0–33.4)
MCHC: 33.9 g/dL (ref 32.0–35.9)
MONO%: 10.7 % (ref 0.0–13.0)
NEUT#: 3.3 10*3/uL (ref 1.5–6.5)
Platelets: 238 10*3/uL (ref 145–400)
RBC: 3.79 10*6/uL — ABNORMAL LOW (ref 4.20–5.70)
RDW: 14.1 % (ref 11.1–15.7)

## 2013-01-27 LAB — PROTIME-INR (CHCC SATELLITE)
INR: 2.5 (ref 2.0–3.5)
Protime: 30 Seconds — ABNORMAL HIGH (ref 10.6–13.4)

## 2013-01-27 NOTE — Progress Notes (Signed)
This office note has been dictated.

## 2013-01-28 ENCOUNTER — Other Ambulatory Visit (HOSPITAL_BASED_OUTPATIENT_CLINIC_OR_DEPARTMENT_OTHER): Payer: BC Managed Care – PPO

## 2013-01-28 NOTE — Progress Notes (Signed)
CC:   Calvin Loft, MD  DIAGNOSES: 1. Anemia of renal insufficiency. 2. Intermittent iron-deficiency anemia (past gastrointestinal blood     loss). 3. Hypotestosteronemia, secondary to past history of testicular     seminoma. 4. Prostatic tricuspid valve, long-term anticoagulation.  CURRENT THERAPY: 1. IV iron as indicated. 2. Aranesp 300 mcg subcu as needed for hemoglobin less than 11. 3. Testosterone, topical gel. 4. Lifelong Coumadin to maintain INR between 2 and 3.  INTERIM HISTORY:  Calvin [name] comes in for a followup.  He is doing okay. He unfortunately has gained weight.  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  He is really thinking long and hard about continuing his work.  He is not sure he really wants to keep up this "pace."  __________ he has not had any problems with bleeding.  He has had no melena or bright red blood per rectum.  He has had no cough.  He has had no nausea or vomiting.  He has had no headache.  When we last saw him, his iron studies showed a ferritin of 742 with an iron saturation of 19%.  We last gave him iron back in June.  He last got Aranesp back in late August.  PHYSICAL EXAMINATION:  General:  This is an obese white gentleman in no obvious distress.  Vital Signs:  Show temperature of 98.4, pulse 62, respiratory rate 18, blood pressure 135/53.  Weight is 308 pounds.  Head and Neck:  Shows a normocephalic, atraumatic skull.  There are no ocular or oral lesions.  He has no palpable cervical or supraclavicular lymph nodes.  Lungs:  Clear bilaterally.  Cardiac:  Regular rate and rhythm with a normal S1, S2.  He has a systolic click from his tricuspid valve. There may be a 1/6 systolic ejection murmur.  Abdomen:  Obese.  He has decent bowel sounds.  There is no fluid wave.  No palpable hepatosplenomegaly is noted.  Back:  No tenderness over the spine, ribs, or hips.  Extremities:  Show no clubbing, cyanosis or edema.  There may be some  slight nonpitting edema of the lower legs.  Skin:  No rashes, ecchymoses or petechia.  LABORATORY STUDIES:  White cell count is 5, hemoglobin 10.9, hematocrit 32.2, platelet count 238.  BUN is 34, creatinine 1.71.  IMPRESSION:  Calvin [name] is a very nice 57 year old gentleman with multifactorial anemia.  He has a prostatic valve.  He has low testosterone.  Today, he feels that the testosterone probably is not helping him all that much.  His testosterone level today was 800.  His INR today was 2.5.  We will continue to follow him monthly.  We will hold on the Aranesp for right now.  I would doubt that he needs any iron.  I will see him back in another 3 months.    ______________________________ Volanda Napoleon, M.D. PRE/MEDQ  D:  01/27/2013  T:  01/28/2013  Job:  [id number]

## 2013-01-31 ENCOUNTER — Ambulatory Visit (HOSPITAL_BASED_OUTPATIENT_CLINIC_OR_DEPARTMENT_OTHER)
Admission: RE | Admit: 2013-01-31 | Discharge: 2013-01-31 | Disposition: A | Discharge status: Home / Self Care | Payer: BC Managed Care – PPO | Source: Ambulatory Visit | Attending: Hematology & Oncology | Admitting: Hematology & Oncology

## 2013-01-31 DIAGNOSIS — N189 Chronic kidney disease, unspecified: Secondary | ICD-10-CM | POA: Insufficient documentation

## 2013-01-31 DIAGNOSIS — C6290 Malignant neoplasm of unspecified testis, unspecified whether descended or undescended: Secondary | ICD-10-CM | POA: Insufficient documentation

## 2013-01-31 DIAGNOSIS — R109 Unspecified abdominal pain: Secondary | ICD-10-CM

## 2013-02-03 ENCOUNTER — Telehealth: Payer: Self-pay | Admitting: *Deleted

## 2013-02-03 NOTE — Telephone Encounter (Addendum)
Message copied by Jodelle Green on Mon Feb 03, 2013 11:47 AM ------      Message from: Volanda Napoleon      Created: Fri Jan 31, 2013  3:36 PM       Call - U/S is ok! Pete -----  Pt called inquiring about his [place] results. Gave him the above message from Dr Marin Olp with verbalized understanding.

## 2013-02-10 ENCOUNTER — Other Ambulatory Visit: Payer: Self-pay | Admitting: Hematology & Oncology

## 2013-02-27 ENCOUNTER — Ambulatory Visit (HOSPITAL_BASED_OUTPATIENT_CLINIC_OR_DEPARTMENT_OTHER): Payer: BC Managed Care – PPO | Admitting: Lab

## 2013-02-27 ENCOUNTER — Ambulatory Visit: Payer: BC Managed Care – PPO

## 2013-02-27 VITALS — BP 134/66 | HR 58 | Temp 97.4°F | Resp 20

## 2013-02-27 DIAGNOSIS — D649 Anemia, unspecified: Secondary | ICD-10-CM

## 2013-02-27 DIAGNOSIS — C61 Malignant neoplasm of prostate: Secondary | ICD-10-CM

## 2013-02-27 DIAGNOSIS — N289 Disorder of kidney and ureter, unspecified: Secondary | ICD-10-CM

## 2013-02-27 DIAGNOSIS — N189 Chronic kidney disease, unspecified: Secondary | ICD-10-CM

## 2013-02-27 DIAGNOSIS — D631 Anemia in chronic kidney disease: Secondary | ICD-10-CM

## 2013-02-27 DIAGNOSIS — Z7901 Long term (current) use of anticoagulants: Secondary | ICD-10-CM

## 2013-02-27 LAB — CBC WITH DIFFERENTIAL (CANCER CENTER ONLY)
BASO#: 0 10*3/uL (ref 0.0–0.2)
Eosinophils Absolute: 0.2 10*3/uL (ref 0.0–0.5)
HCT: 32.1 % — ABNORMAL LOW (ref 38.7–49.9)
HGB: 10.7 g/dL — ABNORMAL LOW (ref 13.0–17.1)
LYMPH#: 1 10*3/uL (ref 0.9–3.3)
LYMPH%: 17.8 % (ref 14.0–48.0)
MCH: 28.4 pg (ref 28.0–33.4)
MCHC: 33.3 g/dL (ref 32.0–35.9)
MCV: 85 fL (ref 82–98)
MONO#: 0.5 10*3/uL (ref 0.1–0.9)
MONO%: 7.8 % (ref 0.0–13.0)
NEUT%: 71 % (ref 40.0–80.0)
WBC: 5.8 10*3/uL (ref 4.0–10.0)

## 2013-02-27 LAB — PROTIME-INR (CHCC SATELLITE): Protime: 24 Seconds — ABNORMAL HIGH (ref 10.6–13.4)

## 2013-02-27 MED ORDER — DARBEPOETIN ALFA-POLYSORBATE 200 MCG/0.4ML IJ SOLN: 200.0000 ug | Freq: Once | INTRAMUSCULAR | Status: DC

## 2013-02-27 MED ORDER — DARBEPOETIN ALFA-POLYSORBATE 300 MCG/0.6ML IJ SOLN
300.0000 ug | Freq: Once | INTRAMUSCULAR | Status: AC
  Administered 2013-02-27: 300 ug via SUBCUTANEOUS

## 2013-02-27 MED ORDER — DARBEPOETIN ALFA-POLYSORBATE 300 MCG/0.6ML IJ SOLN
INTRAMUSCULAR | Status: AC
  Filled 2013-02-27: qty 0.6

## 2013-02-27 NOTE — Patient Instructions (Signed)
Darbepoetin Alfa injection What is this medicine? DARBEPOETIN ALFA (dar be POE e tin AL fa) helps your body make more red blood cells. It is used to treat anemia caused by chronic kidney failure and chemotherapy. This medicine may be used for other purposes; ask your health care provider or pharmacist if you have questions. What should I tell my health care provider before I take this medicine? They need to know if you have any of these conditions: -blood clotting disorders or history of blood clots -cancer patient not on chemotherapy -cystic fibrosis -heart disease, such as angina, heart failure, or a history of a heart attack -hemoglobin level of 12 g/dL or greater -high blood pressure -low levels of folate, iron, or vitamin B12 -seizures -an unusual or allergic reaction to darbepoetin, erythropoietin, albumin, hamster proteins, latex, other medicines, foods, dyes, or preservatives -pregnant or trying to get pregnant -breast-feeding How should I use this medicine? This medicine is for injection into a vein or under the skin. It is usually given by a health care professional in a hospital or clinic setting. If you get this medicine at home, you will be taught how to prepare and give this medicine. Do not shake the solution before you withdraw a dose. Use exactly as directed. Take your medicine at regular intervals. Do not take your medicine more often than directed. It is important that you put your used needles and syringes in a special sharps container. Do not put them in a trash can. If you do not have a sharps container, call your pharmacist or healthcare provider to get one. Talk to your pediatrician regarding the use of this medicine in children. While this medicine may be used in children as young as 1 year for selected conditions, precautions do apply. Overdosage: If you think you have taken too much of this medicine contact a poison control center or emergency room at once. NOTE:  This medicine is only for you. Do not share this medicine with others. What if I miss a dose? If you miss a dose, take it as soon as you can. If it is almost time for your next dose, take only that dose. Do not take double or extra doses. What may interact with this medicine? Do not take this medicine with any of the following medications: -epoetin alfa This list may not describe all possible interactions. Give your health care provider a list of all the medicines, herbs, non-prescription drugs, or dietary supplements you use. Also tell them if you smoke, drink alcohol, or use illegal drugs. Some items may interact with your medicine. What should I watch for while using this medicine? Visit your prescriber or health care professional for regular checks on your progress and for the needed blood tests and blood pressure measurements. It is especially important for the doctor to make sure your hemoglobin level is in the desired range, to limit the risk of potential side effects and to give you the best benefit. Keep all appointments for any recommended tests. Check your blood pressure as directed. Ask your doctor what your blood pressure should be and when you should contact him or her. As your body makes more red blood cells, you may need to take iron, folic acid, or vitamin B supplements. Ask your doctor or health care provider which products are right for you. If you have kidney disease continue dietary restrictions, even though this medication can make you feel better. Talk with your doctor or health care professional about the   foods you eat and the vitamins that you take. What side effects may I notice from receiving this medicine? Side effects that you should report to your doctor or health care professional as soon as possible: -allergic reactions like skin rash, itching or hives, swelling of the face, lips, or tongue -breathing problems -changes in vision -chest pain -confusion, trouble speaking  or understanding -feeling faint or lightheaded, falls -high blood pressure -muscle aches or pains -pain, swelling, warmth in the leg -rapid weight gain -severe headaches -sudden numbness or weakness of the face, arm or leg -trouble walking, dizziness, loss of balance or coordination -seizures (convulsions) -swelling of the ankles, feet, hands -unusually weak or tired Side effects that usually do not require medical attention (report to your doctor or health care professional if they continue or are bothersome): -diarrhea -fever, chills (flu-like symptoms) -headaches -nausea, vomiting -redness, stinging, or swelling at site where injected This list may not describe all possible side effects. Call your doctor for medical advice about side effects. You may report side effects to FDA at 1-800-FDA-1088. Where should I keep my medicine? Keep out of the reach of children. Store in a refrigerator between 2 and 8 degrees C (36 and 46 degrees F). Do not freeze. Do not shake. Throw away any unused portion if using a single-dose vial. Throw away any unused medicine after the expiration date. NOTE: This sheet is a summary. It may not cover all possible information. If you have questions about this medicine, talk to your doctor, pharmacist, or health care provider.  2013, [employer]. (03/31/2008 10:23:57 AM)

## 2013-03-06 ENCOUNTER — Other Ambulatory Visit: Payer: Self-pay

## 2013-03-24 ENCOUNTER — Ambulatory Visit (HOSPITAL_BASED_OUTPATIENT_CLINIC_OR_DEPARTMENT_OTHER): Payer: BC Managed Care – PPO | Admitting: Lab

## 2013-03-24 DIAGNOSIS — D630 Anemia in neoplastic disease: Secondary | ICD-10-CM

## 2013-03-24 DIAGNOSIS — Z7901 Long term (current) use of anticoagulants: Secondary | ICD-10-CM

## 2013-03-24 DIAGNOSIS — C61 Malignant neoplasm of prostate: Secondary | ICD-10-CM

## 2013-03-24 LAB — CBC WITH DIFFERENTIAL (CANCER CENTER ONLY)
BASO%: 0.5 % (ref 0.0–2.0)
EOS%: 3 % (ref 0.0–7.0)
Eosinophils Absolute: 0.2 10*3/uL (ref 0.0–0.5)
HGB: 11.8 g/dL — ABNORMAL LOW (ref 13.0–17.1)
LYMPH%: 18.3 % (ref 14.0–48.0)
MCH: 28.3 pg (ref 28.0–33.4)
MCHC: 33.1 g/dL (ref 32.0–35.9)
MCV: 85 fL (ref 82–98)
MONO%: 5.9 % (ref 0.0–13.0)
NEUT#: 4.1 10*3/uL (ref 1.5–6.5)
Platelets: 228 10*3/uL (ref 145–400)
RBC: 4.17 10*6/uL — ABNORMAL LOW (ref 4.20–5.70)

## 2013-03-24 LAB — PROTIME-INR (CHCC SATELLITE)
INR: 2.6 (ref 2.0–3.5)
Protime: 31.2 Seconds — ABNORMAL HIGH (ref 10.6–13.4)

## 2013-04-10 ENCOUNTER — Ambulatory Visit (HOSPITAL_COMMUNITY): Payer: BC Managed Care – PPO | Attending: Cardiology | Admitting: Radiology

## 2013-04-10 DIAGNOSIS — I359 Nonrheumatic aortic valve disorder, unspecified: Secondary | ICD-10-CM

## 2013-04-10 DIAGNOSIS — I4891 Unspecified atrial fibrillation: Secondary | ICD-10-CM | POA: Insufficient documentation

## 2013-04-10 DIAGNOSIS — Z954 Presence of other heart-valve replacement: Secondary | ICD-10-CM | POA: Insufficient documentation

## 2013-04-10 DIAGNOSIS — I10 Essential (primary) hypertension: Secondary | ICD-10-CM | POA: Insufficient documentation

## 2013-04-10 DIAGNOSIS — I079 Rheumatic tricuspid valve disease, unspecified: Secondary | ICD-10-CM | POA: Insufficient documentation

## 2013-04-10 NOTE — Progress Notes (Signed)
Echocardiogram performed.  

## 2013-04-11 NOTE — Progress Notes (Deleted)
°   Patient ID: Calvin [name], male    DOB: Mar 10, 1956, 57 y.o.   MRN: [id number]  HPI    Review of Systems    Physical Exam

## 2013-04-11 NOTE — Progress Notes (Signed)
Echo report Faxed to Dr. Vanetta Shawl @ [facility] @[phone] and Dr. Valora Piccolo

## 2013-04-11 NOTE — Progress Notes (Deleted)
°   Patient ID: Calvin [name], male    DOB: 09/02/1955, 57 y.o.   MRN: [id number]  HPI    Review of Systems    Physical Exam

## 2013-04-11 NOTE — Progress Notes (Deleted)
°   Patient ID: Calvin [name], male    DOB: Oct 26, 1955, 57 y.o.   MRN: [id number]  HPI    Review of Systems    Physical Exam

## 2013-04-16 ENCOUNTER — Encounter: Payer: Self-pay | Admitting: Cardiology

## 2013-04-16 ENCOUNTER — Ambulatory Visit (INDEPENDENT_AMBULATORY_CARE_PROVIDER_SITE_OTHER): Payer: BC Managed Care – PPO | Admitting: Cardiology

## 2013-04-16 VITALS — BP 130/70 | HR 64 | Ht 74.0 in | Wt 314.0 lb

## 2013-04-16 DIAGNOSIS — I10 Essential (primary) hypertension: Secondary | ICD-10-CM

## 2013-04-16 DIAGNOSIS — I712 Thoracic aortic aneurysm, without rupture: Secondary | ICD-10-CM

## 2013-04-16 DIAGNOSIS — Z954 Presence of other heart-valve replacement: Secondary | ICD-10-CM

## 2013-04-16 DIAGNOSIS — Q251 Coarctation of aorta: Secondary | ICD-10-CM

## 2013-04-16 DIAGNOSIS — I4891 Unspecified atrial fibrillation: Secondary | ICD-10-CM

## 2013-04-16 DIAGNOSIS — Z952 Presence of prosthetic heart valve: Secondary | ICD-10-CM

## 2013-04-16 NOTE — Assessment & Plan Note (Signed)
Continue SBE prophylaxis. 

## 2013-04-16 NOTE — Assessment & Plan Note (Signed)
Remains enlarged by echo but unchanged. He is also followed at the [facility] clinic. I plan is to continue echoes. If there is further dilatation we would need to consider a CTA despite renal insufficiency.

## 2013-04-16 NOTE — Assessment & Plan Note (Signed)
Recent echo shows no significant change. Patient also followed at the [facility] clinic.

## 2013-04-16 NOTE — Progress Notes (Signed)
HPI: Pleasant male with past medical history of paroxysmal atrial fibrillation/flutter, history of aortic valve replacement, aortic coarctation, aortic aneurysm, coronary artery disease on CT scan and renal insufficiency for fu. Patient is s/p coarctation repair in 1958 and redo in 1971. Had endovascular stent in 1995 and 1996. Had aortic balloon valvuloplasty in 1974 and AVR in 1994. 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 Stent in distal arch, proximal descending aorta patent. Mildly enlarged lymph nodes. He is seen at the [facility] clinic on a yearly basis for followup of above. Patient also has atrial flutter. We cardioverted him previously and his dyspnea on exertion improved. Echocardiogram in December of 2014 showed normal LV function, mechanical mitral valve, moderate left atrial enlargement and gradient across for repair. Patient last seen in [place] in July. Echocardiogram showed thoracic aortic aneurysm with stable in size. CTA was recommended if increased in size by echo. I last saw him in May of 2014. Since then he denies dyspnea, chest pain, palpitations or syncope.   Current Outpatient Prescriptions  Medication Sig Dispense Refill  . acetaminophen (TYLENOL) 500 MG tablet Take 500 mg by mouth as needed. For pain      . amLODipine (NORVASC) 10 MG tablet Take 1 tablet (10 mg total) by mouth daily.  30 tablet  3  . amoxicillin (AMOXIL) 500 MG capsule Before dental appts.      Marland Kitchen b complex vitamins tablet Take 1 tablet by mouth 2 (two) times daily.       . cloNIDine (CATAPRES) 0.1 MG tablet Take 0.1 mg by mouth 3 (three) times daily.      . cyclobenzaprine (FLEXERIL) 10 MG tablet Take 5 mg by mouth at bedtime as needed.       . Febuxostat (ULORIC) 80 MG TABS Take by mouth every morning.      . furosemide (LASIX) 20 MG tablet Take 20 mg by mouth daily.       Marland Kitchen HYDROcodone-acetaminophen (VICODIN) 5-500 MG per tablet Take 1 tablet by mouth every 6  (six) hours as needed for pain.  30 tablet  2  . labetalol (NORMODYNE) 200 MG tablet 2 tabs am 1 tab pm  90 tablet  3  . metoprolol (LOPRESSOR) 100 MG tablet TAKE 1 TABLET BY MOUTH ONCE DAILY.  90 tablet  3  . olmesartan (BENICAR) 40 MG tablet Take 1 tablet (40 mg total) by mouth daily.  90 tablet  3  . pantoprazole (PROTONIX) 40 MG tablet Take 40 mg by mouth daily.       . sildenafil (VIAGRA) 50 MG tablet Take 50 mg by mouth daily as needed.       . Testosterone 10 MG/ACT (2%) GEL APPLY 2 PUMPS ONCE DAILY  60 g  3  . warfarin (COUMADIN) 7.5 MG tablet        No current facility-administered medications for this visit.     Past Medical History  Diagnosis Date  . 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 HTN (hypertension)   . CAD (coronary artery disease)   . Aortic aneurysm   . Gout   . Atrial fibrillation   . Anemia   . Hyperlipidemia   . Atrial flutter   . Obesity   . Anemia of renal disease 10/04/2011  . CKD (chronic kidney disease)     baseline creatinine is 1.5-2  . Gastric polyp 2007, 06/2012    benign.  . Colonic  ulcer 2007    felt to be secondary to NSAID  . PONV (postoperative nausea and vomiting)   . Testicular adenoma     Past Surgical History  Procedure Laterality Date  . Coarctation of aorta repair  1958  . Coarctation of aorta repair  1970  . Aortic valve replacement and mitral valve repair  1974    Bicuspid aortic valve  . Orchiectomy  1992    testicular cancer  . Aortic valve replacement  1995    [facility]  . Tonsillectomy    . Cardioversion  02/27/2012    Procedure: CARDIOVERSION;  Surgeon: Lelon Perla, MD;  Location: [facility] ENDOSCOPY;  Service: Cardiovascular;  Laterality: N/A;  . Esophagogastroduodenoscopy N/A 07/04/2012    Procedure: ESOPHAGOGASTRODUODENOSCOPY (EGD);  Surgeon: Inda Castle, MD;  Location: [facility];  Service: Endoscopy;  Laterality: N/A;    History   Social History  . Marital Status: Married    Spouse Name:  N/A    Number of Children: 4  . Years of Education: N/A   Occupational History  . Not on file.   Social History Main Topics  . Smoking status: Former Research scientist (life sciences)  . Smokeless tobacco: Never Used     Comment: 25 years ago as of 2014   . Alcohol Use: Yes     Comment: wine, occasionally  . Drug Use: No  . Sexual Activity: Not on file   Other Topics Concern  . Not on file   Social History Narrative   Pt lives in [place] with wife.  3 children ages 6,20,22 (all 3 are adopted)   Sales grinding wheels.    ROS: no fevers or chills, productive cough, hemoptysis, dysphasia, odynophagia, melena, hematochezia, dysuria, hematuria, rash, seizure activity, orthopnea, PND, pedal edema, claudication. Remaining systems are negative.  Physical Exam: Well-developed well-nourished in no acute distress.  Skin is warm and dry.  HEENT is normal.  Neck is supple.  Chest is clear to auscultation with normal expansion.  Cardiovascular exam is regular rate and rhythm. Crisp mechanical valve sound. 2/6 systolic murmur. No diastolic murmur. Abdominal exam nontender or distended. No masses palpated. Extremities show trace edema. neuro grossly intact  ECG sinus rhythm with first degree AV block. Normal axis. Nonspecific T-wave changes.

## 2013-04-16 NOTE — Assessment & Plan Note (Signed)
Patient remains in sinus rhythm. Continue beta blocker and Coumadin.

## 2013-04-16 NOTE — Assessment & Plan Note (Signed)
Blood pressure controlled.continue present medications. 

## 2013-04-16 NOTE — Patient Instructions (Signed)
Your physician wants you to follow-up in: ONE YEAR WITH DR CRENSHAW You will receive a reminder letter in the mail two months in advance. If you don't receive a letter, please call our office to schedule the follow-up appointment.  

## 2013-04-16 NOTE — Assessment & Plan Note (Signed)
Followed by nephrology. 

## 2013-04-18 ENCOUNTER — Telehealth: Payer: Self-pay | Admitting: Cardiology

## 2013-04-18 DIAGNOSIS — D509 Iron deficiency anemia, unspecified: Secondary | ICD-10-CM

## 2013-04-18 DIAGNOSIS — C61 Malignant neoplasm of prostate: Secondary | ICD-10-CM

## 2013-04-18 MED ORDER — AMLODIPINE BESYLATE 10 MG PO TABS: 10.0000 mg | ORAL_TABLET | Freq: Every day | ORAL | Status: DC

## 2013-04-18 NOTE — Telephone Encounter (Signed)
Spoke with pt, aware script sent to the pharm

## 2013-04-18 NOTE — Telephone Encounter (Signed)
New problem   Pt asked that you please call in  Amlodiping 10mg  90 day to [facility] on [address].  Thank you.

## 2013-04-28 ENCOUNTER — Encounter: Payer: Self-pay | Admitting: Nurse Practitioner

## 2013-04-28 ENCOUNTER — Other Ambulatory Visit: Payer: Self-pay

## 2013-04-28 ENCOUNTER — Other Ambulatory Visit (HOSPITAL_BASED_OUTPATIENT_CLINIC_OR_DEPARTMENT_OTHER): Payer: BC Managed Care – PPO | Admitting: Lab

## 2013-04-28 ENCOUNTER — Ambulatory Visit (HOSPITAL_BASED_OUTPATIENT_CLINIC_OR_DEPARTMENT_OTHER): Payer: BC Managed Care – PPO

## 2013-04-28 ENCOUNTER — Ambulatory Visit (HOSPITAL_BASED_OUTPATIENT_CLINIC_OR_DEPARTMENT_OTHER): Payer: BC Managed Care – PPO | Admitting: Hematology & Oncology

## 2013-04-28 VITALS — BP 155/67 | HR 72 | Temp 99.7°F | Resp 18 | Ht 74.0 in | Wt 323.0 lb

## 2013-04-28 DIAGNOSIS — N189 Chronic kidney disease, unspecified: Secondary | ICD-10-CM

## 2013-04-28 DIAGNOSIS — D509 Iron deficiency anemia, unspecified: Secondary | ICD-10-CM

## 2013-04-28 DIAGNOSIS — I070 Rheumatic tricuspid stenosis: Secondary | ICD-10-CM

## 2013-04-28 DIAGNOSIS — C61 Malignant neoplasm of prostate: Secondary | ICD-10-CM

## 2013-04-28 DIAGNOSIS — D631 Anemia in chronic kidney disease: Secondary | ICD-10-CM

## 2013-04-28 DIAGNOSIS — N289 Disorder of kidney and ureter, unspecified: Secondary | ICD-10-CM

## 2013-04-28 DIAGNOSIS — E349 Endocrine disorder, unspecified: Secondary | ICD-10-CM

## 2013-04-28 DIAGNOSIS — R635 Abnormal weight gain: Secondary | ICD-10-CM

## 2013-04-28 DIAGNOSIS — D649 Anemia, unspecified: Secondary | ICD-10-CM

## 2013-04-28 DIAGNOSIS — Z7901 Long term (current) use of anticoagulants: Secondary | ICD-10-CM

## 2013-04-28 DIAGNOSIS — R5381 Other malaise: Secondary | ICD-10-CM

## 2013-04-28 LAB — IRON AND TIBC CHCC
%SAT: 7 % — ABNORMAL LOW (ref 20–55)
Iron: 16 ug/dL — ABNORMAL LOW (ref 42–163)
TIBC: 241 ug/dL (ref 202–409)
UIBC: 225 ug/dL (ref 117–376)

## 2013-04-28 LAB — FERRITIN CHCC: Ferritin: 1428 ng/ml — ABNORMAL HIGH (ref 22–316)

## 2013-04-28 LAB — BASIC METABOLIC PANEL - CANCER CENTER ONLY
CO2: 24 mEq/L (ref 18–33)
Calcium: 8.8 mg/dL (ref 8.0–10.3)
Chloride: 103 mEq/L (ref 98–108)
Creat: 2.9 mg/dl — ABNORMAL HIGH (ref 0.6–1.2)
Glucose, Bld: 103 mg/dL (ref 73–118)
Potassium: 4.9 mEq/L — ABNORMAL HIGH (ref 3.3–4.7)
Sodium: 136 mEq/L (ref 128–145)

## 2013-04-28 LAB — CBC WITH DIFFERENTIAL (CANCER CENTER ONLY)
BASO%: 0.1 % (ref 0.0–2.0)
EOS%: 1.5 % (ref 0.0–7.0)
HGB: 10 g/dL — ABNORMAL LOW (ref 13.0–17.1)
MCH: 28.7 pg (ref 28.0–33.4)
MCHC: 33.4 g/dL (ref 32.0–35.9)
MCV: 86 fL (ref 82–98)
MONO%: 7.3 % (ref 0.0–13.0)
NEUT#: 7.1 10*3/uL — ABNORMAL HIGH (ref 1.5–6.5)
NEUT%: 82.4 % — ABNORMAL HIGH (ref 40.0–80.0)
Platelets: 208 10*3/uL (ref 145–400)
WBC: 8.6 10*3/uL (ref 4.0–10.0)

## 2013-04-28 LAB — PROTIME-INR (CHCC SATELLITE)
INR: 3.6 — ABNORMAL HIGH (ref 2.0–3.5)
Protime: 43.2 Seconds — ABNORMAL HIGH (ref 10.6–13.4)

## 2013-04-28 MED ORDER — DARBEPOETIN ALFA-POLYSORBATE 300 MCG/0.6ML IJ SOLN
INTRAMUSCULAR | Status: AC
  Filled 2013-04-28: qty 0.6

## 2013-04-28 MED ORDER — SODIUM CHLORIDE 0.9 % IV SOLN
1020.0000 mg | Freq: Once | INTRAVENOUS | Status: AC
  Administered 2013-04-28: 1020 mg via INTRAVENOUS
  Filled 2013-04-28: qty 34

## 2013-04-28 MED ORDER — SODIUM CHLORIDE 0.9 % IV SOLN
INTRAVENOUS | Status: DC
  Administered 2013-04-28: 11:00:00 via INTRAVENOUS

## 2013-04-28 MED ORDER — DARBEPOETIN ALFA-POLYSORBATE 300 MCG/0.6ML IJ SOLN
300.0000 ug | Freq: Once | INTRAMUSCULAR | Status: AC
  Administered 2013-04-28: 300 ug via SUBCUTANEOUS

## 2013-04-28 NOTE — Progress Notes (Signed)
This office note has been dictated.

## 2013-04-28 NOTE — Patient Instructions (Signed)
Darbepoetin Alfa injection What is this medicine? DARBEPOETIN ALFA (dar be POE e tin AL fa) helps your body make more red blood cells. It is used to treat anemia caused by chronic kidney failure and chemotherapy. This medicine may be used for other purposes; ask your health care provider or pharmacist if you have questions. What should I tell my health care provider before I take this medicine? They need to know if you have any of these conditions: -blood clotting disorders or history of blood clots -cancer patient not on chemotherapy -cystic fibrosis -heart disease, such as angina, heart failure, or a history of a heart attack -hemoglobin level of 12 g/dL or greater -high blood pressure -low levels of folate, iron, or vitamin B12 -seizures -an unusual or allergic reaction to darbepoetin, erythropoietin, albumin, hamster proteins, latex, other medicines, foods, dyes, or preservatives -pregnant or trying to get pregnant -breast-feeding How should I use this medicine? This medicine is for injection into a vein or under the skin. It is usually given by a health care professional in a hospital or clinic setting. If you get this medicine at home, you will be taught how to prepare and give this medicine. Do not shake the solution before you withdraw a dose. Use exactly as directed. Take your medicine at regular intervals. Do not take your medicine more often than directed. It is important that you put your used needles and syringes in a special sharps container. Do not put them in a trash can. If you do not have a sharps container, call your pharmacist or healthcare provider to get one. Talk to your pediatrician regarding the use of this medicine in children. While this medicine may be used in children as young as 1 year for selected conditions, precautions do apply. Overdosage: If you think you have taken too much of this medicine contact a poison control center or emergency room at once. NOTE:  This medicine is only for you. Do not share this medicine with others. What if I miss a dose? If you miss a dose, take it as soon as you can. If it is almost time for your next dose, take only that dose. Do not take double or extra doses. What may interact with this medicine? Do not take this medicine with any of the following medications: -epoetin alfa This list may not describe all possible interactions. Give your health care provider a list of all the medicines, herbs, non-prescription drugs, or dietary supplements you use. Also tell them if you smoke, drink alcohol, or use illegal drugs. Some items may interact with your medicine. What should I watch for while using this medicine? Visit your prescriber or health care professional for regular checks on your progress and for the needed blood tests and blood pressure measurements. It is especially important for the doctor to make sure your hemoglobin level is in the desired range, to limit the risk of potential side effects and to give you the best benefit. Keep all appointments for any recommended tests. Check your blood pressure as directed. Ask your doctor what your blood pressure should be and when you should contact him or her. As your body makes more red blood cells, you may need to take iron, folic acid, or vitamin B supplements. Ask your doctor or health care provider which products are right for you. If you have kidney disease continue dietary restrictions, even though this medication can make you feel better. Talk with your doctor or health care professional about the   foods you eat and the vitamins that you take. What side effects may I notice from receiving this medicine? Side effects that you should report to your doctor or health care professional as soon as possible: -allergic reactions like skin rash, itching or hives, swelling of the face, lips, or tongue -breathing problems -changes in vision -chest pain -confusion, trouble speaking  or understanding -feeling faint or lightheaded, falls -high blood pressure -muscle aches or pains -pain, swelling, warmth in the leg -rapid weight gain -severe headaches -sudden numbness or weakness of the face, arm or leg -trouble walking, dizziness, loss of balance or coordination -seizures (convulsions) -swelling of the ankles, feet, hands -unusually weak or tired Side effects that usually do not require medical attention (report to your doctor or health care professional if they continue or are bothersome): -diarrhea -fever, chills (flu-like symptoms) -headaches -nausea, vomiting -redness, stinging, or swelling at site where injected This list may not describe all possible side effects. Call your doctor for medical advice about side effects. You may report side effects to FDA at 1-800-FDA-1088. Where should I keep my medicine? Keep out of the reach of children. Store in a refrigerator between 2 and 8 degrees C (36 and 46 degrees F). Do not freeze. Do not shake. Throw away any unused portion if using a single-dose vial. Throw away any unused medicine after the expiration date. NOTE: This sheet is a summary. It may not cover all possible information. If you have questions about this medicine, talk to your doctor, pharmacist, or health care provider.  2013, Elsevier/Gold Standard. (03/31/2008 10:23:57 AM)  Ferumoxytol injection What is this medicine? FERUMOXYTOL is an iron complex. Iron is used to make healthy red blood cells, which carry oxygen and nutrients throughout the body. This medicine is used to treat iron deficiency anemia in people with chronic kidney disease. This medicine may be used for other purposes; ask your health care provider or pharmacist if you have questions. COMMON BRAND NAME(S): Feraheme  What should I tell my health care provider before I take this medicine? They need to know if you have any of these conditions: -anemia not caused by low iron levels -high  levels of iron in the blood -magnetic resonance imaging (MRI) test scheduled -an unusual or allergic reaction to iron, other medicines, foods, dyes, or preservatives -pregnant or trying to get pregnant -breast-feeding How should I use this medicine? This medicine is for injection into a vein. It is given by a health care professional in a hospital or clinic setting. Talk to your pediatrician regarding the use of this medicine in children. Special care may be needed. Overdosage: If you think you've taken too much of this medicine contact a poison control center or emergency room at once. Overdosage: If you think you have taken too much of this medicine contact a poison control center or emergency room at once. NOTE: This medicine is only for you. Do not share this medicine with others. What if I miss a dose? It is important not to miss your dose. Call your doctor or health care professional if you are unable to keep an appointment. What may interact with this medicine? This medicine may interact with the following medications: -other iron products This list may not describe all possible interactions. Give your health care provider a list of all the medicines, herbs, non-prescription drugs, or dietary supplements you use. Also tell them if you smoke, drink alcohol, or use illegal drugs. Some items may interact with your medicine. What  should I watch for while using this medicine? Visit your doctor or healthcare professional regularly. Tell your doctor or healthcare professional if your symptoms do not start to get better or if they get worse. You may need blood work done while you are taking this medicine. You may need to follow a special diet. Talk to your doctor. Foods that contain iron include: whole grains/cereals, dried fruits, beans, or peas, leafy green vegetables, and organ meats (liver, kidney). What side effects may I notice from receiving this medicine? Side effects that you should  report to your doctor or health care professional as soon as possible: -allergic reactions like skin rash, itching or hives, swelling of the face, lips, or tongue -breathing problems -changes in blood pressure -feeling faint or lightheaded, falls -fever or chills -flushing, sweating, or hot feelings -swelling of the ankles or feet Side effects that usually do not require medical attention (Report these to your doctor or health care professional if they continue or are bothersome.): -diarrhea -headache -nausea, vomiting -stomach pain This list may not describe all possible side effects. Call your doctor for medical advice about side effects. You may report side effects to FDA at 1-800-FDA-1088. Where should I keep my medicine? This drug is given in a hospital or clinic and will not be stored at home. NOTE: This sheet is a summary. It may not cover all possible information. If you have questions about this medicine, talk to your doctor, pharmacist, or health care provider.  2014, Elsevier/Gold Standard. (2011-12-01 15:23:36)

## 2013-04-28 NOTE — Progress Notes (Signed)
Per Dr. Marin Olp a copy of pt's EKG was faxed to Dr. Jacalyn Lefevre office.

## 2013-04-29 NOTE — Progress Notes (Signed)
DIAGNOSES: 1. Anemia of renal insufficiency. 2. Intermittent iron-deficiency anemia. 3. 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 4. Chronic renal insufficiency. 5. Hypotestosteronemia.  CURRENT THERAPY: 1. Aranesp 300 mcg subcu as needed for hemoglobin less than 11. 2. Coumadin-lifelong. 3. IV iron as indicated. 4. Topical testosterone gel.  INTERIM HISTORY:  Mr. Gortney comes in for followup.  We last saw him back in September.  Since then, he has had some issues.  He has had a lot of weight gain.  He has gained 15 pounds since we last saw him.  He just feels tired all the time.  He has had no nausea or vomiting.  He has had no bleeding.  His INR has been checked monthly.  So far, this has not been much of a problem for him.  He sees a kidney doctor every 6 months.  When we checked his BUN and creatinine back in September, it was 34 and 1.71, which is actually pretty good for him.  He has not had iron in a while.  Back in June, his iron saturation was 19% with a ferritin of 742.  Again, he has gained quite a bit of weight.  We did do an EKG on him in the office today.  When I examined him, he sounded like his heart was back in atrial fibrillation.  The EKG showed normal sinus rhythm with abnormal sinus arrhythmia.  I am not sure what this means.  I have the EKG faxed over to Dr. Stanford Breed.  Again, he just feels tired.  He is not traveling for his job as much. He and his wife are going on a cruise in January and he wants to try to feel better for this.  He said that he had been increasing his Lasix that he has been taking.  PHYSICAL EXAMINATION:  General:  This is a fairly well-developed, well- nourished white gentleman in no obvious distress.  Vital Signs: Temperature of 99.7, pulse 72, respiratory rate 16, blood pressure 155/67, weight is 323 pounds.  Head and Neck:  Normocephalic, atraumatic skull.  There are no ocular or oral lesions.  There are no  palpable cervical or supraclavicular lymph nodes.  Lungs:  Clear bilaterally. Cardiac:  Regular rate and rhythm.  He has systolic click from his mechanical valve.  Abdomen:  Soft.  He has decent bowel sounds.  There is no fluid wave.  There is no palpable abdominal mass.  There is no palpable hepatosplenomegaly.  Back:  No tenderness over the spine, ribs, or hips.  Extremities:  Show some 1+ edema in his lower legs.  He has decent strength in his arms and his legs.  He has good range of motion of his joints.  Neurological:  No focal neurological deficits.  LABORATORY STUDIES:  White cell count is 8.6, hemoglobin 10, hematocrit 30, platelet count 208.  INR is 3.6.  IMPRESSION:  Mr. Wilden is a nice 57 year old gentleman.  He has multifactorial anemia.  He clearly will get an Aranesp shot today.  I will also give him a dose of iron.  I really think this may help him out.  We would like to see what his testosterone level is.  We also would like to see what his renal function is.  We will check his labs monthly.  We will try to coordinate with his nephrologist, who is at [facility]. Hopefully, we can get his labs monthly here and make it easier for him.  I want to see him back in  another month, so we can follow up with his lab work.    ______________________________ Volanda Napoleon, M.D. PRE/MEDQ  D:  04/28/2013  T:  04/29/2013  Job:  [id number]

## 2013-05-05 ENCOUNTER — Telehealth: Payer: Self-pay | Admitting: Hematology & Oncology

## 2013-05-05 ENCOUNTER — Telehealth: Payer: Self-pay | Admitting: Cardiology

## 2013-05-05 NOTE — Telephone Encounter (Signed)
Pt called made 1-7 lab appointment said MD wanted it, RN aware

## 2013-05-05 NOTE — Telephone Encounter (Signed)
Spoke with pt, aware I have printed the EKG for dr Stanford Breed to review tomorrow afternoon when he is back in the office. Pt agreed with this plan.

## 2013-05-05 NOTE — Telephone Encounter (Signed)
New message     On dec 29 got ekg by Dr Marin Olp.  He found it "unusual" and was going to fax it to [place]. Did we get the ekg?  Pt want [place] to look at it and call him back.  He is scheduled to go on a cruise next week and do not want to go until someone looks at the ekg.

## 2013-05-06 NOTE — Telephone Encounter (Signed)
Spoke with pt, aware dr Stanford Breed has reviewed his EKG and he is back in atrial flutter. Per dr Stanford Breed if pt is feeling fine he is okay to go on his trip. Pt is feeling fine. I scheduled the pt to see dr Stanford Breed when he returns to discuss the atrial flutter. Pt agreed with this plan.

## 2013-05-07 ENCOUNTER — Other Ambulatory Visit (HOSPITAL_BASED_OUTPATIENT_CLINIC_OR_DEPARTMENT_OTHER): Payer: BC Managed Care – PPO | Admitting: Lab

## 2013-05-07 DIAGNOSIS — N183 Chronic kidney disease, stage 3 unspecified: Secondary | ICD-10-CM

## 2013-05-07 DIAGNOSIS — E349 Endocrine disorder, unspecified: Secondary | ICD-10-CM

## 2013-05-07 DIAGNOSIS — D649 Anemia, unspecified: Secondary | ICD-10-CM

## 2013-05-07 DIAGNOSIS — N289 Disorder of kidney and ureter, unspecified: Secondary | ICD-10-CM

## 2013-05-07 DIAGNOSIS — I070 Rheumatic tricuspid stenosis: Secondary | ICD-10-CM

## 2013-05-07 DIAGNOSIS — D509 Iron deficiency anemia, unspecified: Secondary | ICD-10-CM

## 2013-05-07 LAB — PROTHROMBIN TIME
INR: 2.54 — ABNORMAL HIGH (ref <1.50)
PROTHROMBIN TIME: 26.7 s — AB (ref 11.6–15.2)

## 2013-05-07 LAB — CBC WITH DIFFERENTIAL (CANCER CENTER ONLY)
BASO#: 0 10*3/uL (ref 0.0–0.2)
BASO%: 0.4 % (ref 0.0–2.0)
EOS ABS: 0.2 10*3/uL (ref 0.0–0.5)
EOS%: 4 % (ref 0.0–7.0)
HCT: 32.1 % — ABNORMAL LOW (ref 38.7–49.9)
HEMOGLOBIN: 10.6 g/dL — AB (ref 13.0–17.1)
LYMPH#: 1 10*3/uL (ref 0.9–3.3)
LYMPH%: 19.1 % (ref 14.0–48.0)
MCH: 28.8 pg (ref 28.0–33.4)
MCHC: 33 g/dL (ref 32.0–35.9)
MCV: 87 fL (ref 82–98)
MONO#: 0.4 10*3/uL (ref 0.1–0.9)
MONO%: 7.6 % (ref 0.0–13.0)
NEUT#: 3.4 10*3/uL (ref 1.5–6.5)
NEUT%: 68.9 % (ref 40.0–80.0)
Platelets: 359 10*3/uL (ref 145–400)
RBC: 3.68 10*6/uL — AB (ref 4.20–5.70)
RDW: 15.5 % (ref 11.1–15.7)
WBC: 5 10*3/uL (ref 4.0–10.0)

## 2013-05-07 LAB — BASIC METABOLIC PANEL
BUN: 74 mg/dL — AB (ref 6–23)
CALCIUM: 9.2 mg/dL (ref 8.4–10.5)
CO2: 21 mEq/L (ref 19–32)
Chloride: 106 mEq/L (ref 96–112)
Creatinine, Ser: 3.03 mg/dL — ABNORMAL HIGH (ref 0.50–1.35)
GLUCOSE: 99 mg/dL (ref 70–99)
Potassium: 4.6 mEq/L (ref 3.5–5.3)
Sodium: 139 mEq/L (ref 135–145)

## 2013-05-27 ENCOUNTER — Other Ambulatory Visit (HOSPITAL_BASED_OUTPATIENT_CLINIC_OR_DEPARTMENT_OTHER): Payer: BC Managed Care – PPO | Admitting: Lab

## 2013-05-27 ENCOUNTER — Ambulatory Visit (HOSPITAL_BASED_OUTPATIENT_CLINIC_OR_DEPARTMENT_OTHER): Payer: BC Managed Care – PPO | Admitting: Hematology & Oncology

## 2013-05-27 ENCOUNTER — Encounter: Payer: Self-pay | Admitting: Hematology & Oncology

## 2013-05-27 VITALS — BP 119/57 | HR 83 | Temp 97.6°F | Resp 18 | Ht 74.0 in | Wt 315.0 lb

## 2013-05-27 DIAGNOSIS — I070 Rheumatic tricuspid stenosis: Secondary | ICD-10-CM

## 2013-05-27 DIAGNOSIS — D509 Iron deficiency anemia, unspecified: Secondary | ICD-10-CM

## 2013-05-27 DIAGNOSIS — R635 Abnormal weight gain: Secondary | ICD-10-CM

## 2013-05-27 DIAGNOSIS — D649 Anemia, unspecified: Secondary | ICD-10-CM

## 2013-05-27 DIAGNOSIS — N183 Chronic kidney disease, stage 3 unspecified: Secondary | ICD-10-CM

## 2013-05-27 DIAGNOSIS — E291 Testicular hypofunction: Secondary | ICD-10-CM

## 2013-05-27 DIAGNOSIS — N289 Disorder of kidney and ureter, unspecified: Secondary | ICD-10-CM

## 2013-05-27 DIAGNOSIS — E349 Endocrine disorder, unspecified: Secondary | ICD-10-CM

## 2013-05-27 LAB — PROTIME-INR (CHCC SATELLITE)
INR: 2.9 (ref 2.0–3.5)
Protime: 34.8 Seconds — ABNORMAL HIGH (ref 10.6–13.4)

## 2013-05-27 LAB — CBC WITH DIFFERENTIAL (CANCER CENTER ONLY)
BASO#: 0 10*3/uL (ref 0.0–0.2)
BASO%: 0.6 % (ref 0.0–2.0)
EOS%: 4.4 % (ref 0.0–7.0)
Eosinophils Absolute: 0.2 10*3/uL (ref 0.0–0.5)
HCT: 34.7 % — ABNORMAL LOW (ref 38.7–49.9)
HGB: 11.4 g/dL — ABNORMAL LOW (ref 13.0–17.1)
LYMPH#: 1.2 10*3/uL (ref 0.9–3.3)
LYMPH%: 23.5 % (ref 14.0–48.0)
MCH: 28.4 pg (ref 28.0–33.4)
MCHC: 32.9 g/dL (ref 32.0–35.9)
MCV: 87 fL (ref 82–98)
MONO#: 0.5 10*3/uL (ref 0.1–0.9)
MONO%: 10.3 % (ref 0.0–13.0)
NEUT%: 61.2 % (ref 40.0–80.0)
NEUTROS ABS: 3 10*3/uL (ref 1.5–6.5)
PLATELETS: 221 10*3/uL (ref 145–400)
RBC: 4.01 10*6/uL — ABNORMAL LOW (ref 4.20–5.70)
RDW: 14.8 % (ref 11.1–15.7)
WBC: 5 10*3/uL (ref 4.0–10.0)

## 2013-05-27 LAB — TSH CHCC: TSH: 3.096 m[IU]/L (ref 0.320–4.118)

## 2013-05-27 NOTE — Progress Notes (Signed)
This office note has been dictated.

## 2013-05-28 ENCOUNTER — Encounter: Payer: Self-pay | Admitting: Cardiology

## 2013-05-28 ENCOUNTER — Encounter: Payer: Self-pay | Admitting: Nurse Practitioner

## 2013-05-28 ENCOUNTER — Ambulatory Visit (INDEPENDENT_AMBULATORY_CARE_PROVIDER_SITE_OTHER): Payer: BC Managed Care – PPO | Admitting: Cardiology

## 2013-05-28 ENCOUNTER — Other Ambulatory Visit: Payer: Self-pay | Admitting: Cardiology

## 2013-05-28 ENCOUNTER — Encounter: Payer: Self-pay | Admitting: *Deleted

## 2013-05-28 VITALS — BP 140/70 | HR 61 | Ht 74.0 in | Wt 313.0 lb

## 2013-05-28 DIAGNOSIS — Z954 Presence of other heart-valve replacement: Secondary | ICD-10-CM

## 2013-05-28 DIAGNOSIS — I712 Thoracic aortic aneurysm, without rupture, unspecified: Secondary | ICD-10-CM

## 2013-05-28 DIAGNOSIS — I4891 Unspecified atrial fibrillation: Secondary | ICD-10-CM

## 2013-05-28 DIAGNOSIS — I10 Essential (primary) hypertension: Secondary | ICD-10-CM

## 2013-05-28 DIAGNOSIS — Z952 Presence of prosthetic heart valve: Secondary | ICD-10-CM

## 2013-05-28 DIAGNOSIS — I4892 Unspecified atrial flutter: Secondary | ICD-10-CM

## 2013-05-28 DIAGNOSIS — Q251 Coarctation of aorta: Secondary | ICD-10-CM

## 2013-05-28 DIAGNOSIS — E785 Hyperlipidemia, unspecified: Secondary | ICD-10-CM

## 2013-05-28 NOTE — Assessment & Plan Note (Signed)
Patient has developed recurrent atrial flutter versus ectopic atrial rhythm. He is symptomatic with fatigue and increased dyspnea on exertion. His last cardioversion was greater than one year ago. His INR has been therapeutic. Proceed with cardioversion. If he is not hold sinus rhythm and remains symptomatic we will refer for ablation versus consider amiodarone. Continue Coumadin.

## 2013-05-28 NOTE — Progress Notes (Signed)
CC:   Calvin Loft, MD  DIAGNOSES: 1. Anemia of renal insufficiency. 2. Intermittent iron-deficiency anemia. 3. Mechanical tricuspid valve. 4. Chronic renal insufficiency. 5. Hypotestosteronemia.  CURRENT THERAPY: 1. Aranesp 300 mcg subcu as needed for hemoglobin less than 11. 2. IV iron as indicated. 3. Coumadin. 4. Topical testosterone gel.  INTERIM HISTORY:  Calvin [name] comes in for his followup.  He is doing fairly well.  When we last saw him, he was feeling a little bit tired. His renal function was lower.  This I think is getting a little bit better.  He went on a cruise.  He had a great time with his wife.  They enjoyed the cruise.  When we saw him in December, his ferritin was 1400, but his iron saturation was only 7%.  Total iron was 16.  As such, the iron saturation I think were on the low side.  He last got IV iron back on December 29th.  He has had no bleeding.  He has had issues with GI blood loss in the past.  He is on Coumadin.  He does feel he does get tired on occasion.  He says this can happen at any time.  I do not think his thyroid has ever been checked.  We will go ahead and check a TSH on him.  His legs are not swollen.  He says he lost weight secondary to his diuresis.  PHYSICAL EXAMINATION:  This is a somewhat obese white gentleman in no obvious distress.  Vital Signs:  Temperature of 97.6, pulse 83, respiratory rate 18, blood pressure 119/57, and weight is 315 pounds. Head and Neck:  Normocephalic, atraumatic skull.  There are no ocular or oral lesions.  There are no palpable cervical or supraclavicular lymph nodes.  Lungs:  Clear bilaterally.  Cardiac:  Regular rate and rhythm. He has a systolic click.  He has a 1/6 systolic ejection murmur. Abdomen:  Soft, obese.  He has good bowel sounds.  There is no fluid wave.  There is no palpable hepatosplenomegaly.  Back:  No tenderness over the spine, ribs, or hips.  Extremities:  No clubbing,  cyanosis, or edema.  There may be some slight trace edema in his lower legs.  Skin: No rashes, ecchymosis, or petechia.  LABORATORY STUDIES:  White cell count is 5, hemoglobin 11.4, hematocrit 34.7, platelet count 221.  MCV is 87.  His INR is 2.9.  IMPRESSION:  Calvin [name] is a very nice 58 year old gentleman.  He has multiple hematologic issues.  His anemia is much better today.  He does not need an Aranesp injection.  I do not think he needs any iron.  I will plan to see him back in 2-3 months myself.  He comes back monthly for his lab work.  Again, we will add a TSH to his last laboratory studies done today to see about the thyroid study that he has.  His last testosterone value back in mid September was 803.  We are checking that today.    ______________________________ Volanda Napoleon, M.D. PRE/MEDQ  D:  05/27/2013  T:  05/28/2013  Job:  [id number]

## 2013-05-28 NOTE — Assessment & Plan Note (Signed)
Remains enlarged by echo but unchanged. He is also followed at the [facility] clinic. Plan is to continue echoes. If there is further dilatation we would need to consider a CTA despite renal insufficiency.

## 2013-05-28 NOTE — Assessment & Plan Note (Signed)
Recent echocardiogram shows no change. Followed at [facility] clinic.

## 2013-05-28 NOTE — Patient Instructions (Signed)
Your physician has recommended that you have a Cardioversion (DCCV). Electrical Cardioversion uses a jolt of electricity to your heart either through paddles or wired patches attached to your chest. This is a controlled, usually prescheduled, procedure. Defibrillation is done under light anesthesia in the hospital, and you usually go home the day of the procedure. This is done to get your heart back into a normal rhythm. You are not awake for the procedure. Please see the instruction sheet given to you today.   Your physician wants you to follow-up in: 3 [place] will receive a reminder letter in the mail two months in advance. If you don't receive a letter, please call our office to schedule the follow-up appointment.

## 2013-05-28 NOTE — Progress Notes (Signed)
HPI: Pleasant male with past medical history of paroxysmal atrial fibrillation/flutter, history of aortic valve replacement, aortic coarctation, aortic aneurysm, coronary artery disease on CT scan and renal insufficiency for fu. Patient is s/p coarctation repair in 1958 and redo in 1971. Had endovascular stent in 1995 and 1996. Had aortic balloon valvuloplasty in 1974 and AVR in 1994. 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 Stent in distal arch, proximal descending aorta patent. Mildly enlarged lymph nodes. He is seen at the [facility] on a yearly basis for followup of above. Patient also has atrial flutter. We cardioverted him previously and his dyspnea on exertion improved. Echocardiogram in December of 2014 showed normal LV function, mechanical aortic valve, moderate left atrial enlargement and stable gradient across presumed coarctation repair. Patient last seen in [place] in July. Echocardiogram showed thoracic aortic aneurysm with stable in size. CTA was recommended if increased in size by echo. Patient presented for an electrocardiogram from December 29 and was found to be in recurrent atrial flutter. Since then, he note some fatigue and dyspnea on exertion. Occasional palpitations. No chest pain.   Current Outpatient Prescriptions  Medication Sig Dispense Refill  . acetaminophen (TYLENOL) 500 MG tablet Take 500 mg by mouth as needed. For pain      . amLODipine (NORVASC) 10 MG tablet Take 1 tablet (10 mg total) by mouth daily.  90 tablet  3  . amoxicillin (AMOXIL) 500 MG capsule Before dental appts.      Marland Kitchen b complex vitamins tablet Take 1 tablet by mouth 2 (two) times daily.       . cloNIDine (CATAPRES) 0.1 MG tablet Take 0.1 mg by mouth 3 (three) times daily.      Marland Kitchen COLCRYS 0.6 MG tablet Take 0.6 mg by mouth as needed.       . cyclobenzaprine (FLEXERIL) 10 MG tablet Take 5 mg by mouth at bedtime as needed.       . Febuxostat (ULORIC) 80 MG  TABS Take by mouth every morning.      . furosemide (LASIX) 20 MG tablet Take 20 mg by mouth daily. Takes 40 mg if he feels he needs it      . HYDROcodone-acetaminophen (VICODIN) 5-500 MG per tablet Take 1 tablet by mouth every 6 (six) hours as needed for pain.  30 tablet  2  . labetalol (NORMODYNE) 200 MG tablet 2 tabs am 1 tab pm  90 tablet  3  . metoprolol (LOPRESSOR) 100 MG tablet TAKE 1 TABLET BY MOUTH ONCE DAILY.  90 tablet  3  . olmesartan (BENICAR) 40 MG tablet Take 1 tablet (40 mg total) by mouth daily.  90 tablet  3  . pantoprazole (PROTONIX) 40 MG tablet Take 40 mg by mouth daily.       . predniSONE (DELTASONE) 5 MG tablet Take 5 mg by mouth. "As needed for gout"      . sildenafil (VIAGRA) 50 MG tablet Take 50 mg by mouth daily as needed.       . Testosterone 10 MG/ACT (2%) GEL APPLY 2 PUMPS ONCE DAILY  60 g  3  . VOLTAREN 1 % GEL Apply 2 g topically as needed.       . warfarin (COUMADIN) 7.5 MG tablet Take by mouth as directed.        No current facility-administered medications for this visit.     Past Medical History  Diagnosis Date  . 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 HTN (hypertension)   . CAD (coronary artery disease)   . Aortic aneurysm   . Gout   . Atrial fibrillation   . Anemia   . Hyperlipidemia   . Atrial flutter   . Obesity   . Anemia of renal disease 10/04/2011  . CKD (chronic kidney disease)     baseline creatinine is 1.5-2  . Gastric polyp 2007, 06/2012    benign.  . Colonic ulcer 2007    felt to be secondary to NSAID  . PONV (postoperative nausea and vomiting)   . Testicular adenoma     Past Surgical History  Procedure Laterality Date  . Coarctation of aorta repair  1958  . Coarctation of aorta repair  1970  . Aortic valve replacement and mitral valve repair  1974    Bicuspid aortic valve  . Orchiectomy  1992    testicular cancer  . Aortic valve replacement  1995    [facility]  . Tonsillectomy    . Cardioversion   02/27/2012    Procedure: CARDIOVERSION;  Surgeon: Lelon Perla, MD;  Location: [facility] ENDOSCOPY;  Service: Cardiovascular;  Laterality: N/A;  . Esophagogastroduodenoscopy N/A 07/04/2012    Procedure: ESOPHAGOGASTRODUODENOSCOPY (EGD);  Surgeon: Inda Castle, MD;  Location: [place];  Service: Endoscopy;  Laterality: N/A;    History   Social History  . Marital Status: Married    Spouse Name: N/A    Number of Children: 4  . Years of Education: N/A   Occupational History  . Not on file.   Social History Main Topics  . Smoking status: Former Smoker -- 0.25 packs/day for 7 years    Types: Cigarettes    Start date: 05/28/1975    Quit date: 05/27/1981  . Smokeless tobacco: Never Used     Comment: quit 25 years ago  . Alcohol Use: Yes     Comment: wine, occasionally  . Drug Use: No  . Sexual Activity: Not on file   Other Topics Concern  . Not on file   Social History Narrative   Pt lives in [place] with wife.  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    ROS: no fevers or chills, productive cough, hemoptysis, dysphasia, odynophagia, melena, hematochezia, dysuria, hematuria, rash, seizure activity, orthopnea, PND, pedal edema, claudication. Remaining systems are negative.  Physical Exam: Well-developed well-nourished in no acute distress.  Skin is warm and dry.  HEENT is normal.  Neck is supple.  Chest is clear to auscultation with normal expansion.  Cardiovascular exam is irregular, 2/6 systolic murmur left sternal border. Abdominal exam nontender or distended. No masses palpated. Extremities show trace to 1+ edema. neuro grossly intact  ECG atrial flutter versus ectopic atrial tachycardia. IVCD. Nonspecific T-wave changes.

## 2013-05-28 NOTE — Assessment & Plan Note (Signed)
Managed by primary care. 

## 2013-05-28 NOTE — Assessment & Plan Note (Signed)
Continue present blood pressure medications. 

## 2013-05-28 NOTE — Assessment & Plan Note (Signed)
Continue SBE prophylaxis. Continue Coumadin.

## 2013-05-29 ENCOUNTER — Ambulatory Visit (HOSPITAL_COMMUNITY): Payer: BC Managed Care – PPO | Admitting: Anesthesiology

## 2013-05-29 ENCOUNTER — Other Ambulatory Visit: Payer: Self-pay

## 2013-05-29 ENCOUNTER — Encounter (HOSPITAL_COMMUNITY)
Admission: RE | Disposition: A | Discharge status: Home / Self Care | Payer: Self-pay | Source: Ambulatory Visit | Attending: Cardiology

## 2013-05-29 ENCOUNTER — Encounter (HOSPITAL_COMMUNITY): Payer: BC Managed Care – PPO | Admitting: Anesthesiology

## 2013-05-29 ENCOUNTER — Encounter (HOSPITAL_COMMUNITY): Payer: Self-pay | Admitting: *Deleted

## 2013-05-29 ENCOUNTER — Ambulatory Visit (HOSPITAL_COMMUNITY)
Admission: RE | Admit: 2013-05-29 | Discharge: 2013-05-29 | Disposition: A | Discharge status: Home / Self Care | Payer: BC Managed Care – PPO | Source: Ambulatory Visit | Attending: Cardiology | Admitting: Cardiology

## 2013-05-29 DIAGNOSIS — I712 Thoracic aortic aneurysm, without rupture, unspecified: Secondary | ICD-10-CM | POA: Insufficient documentation

## 2013-05-29 DIAGNOSIS — Z954 Presence of other heart-valve replacement: Secondary | ICD-10-CM | POA: Insufficient documentation

## 2013-05-29 DIAGNOSIS — I4891 Unspecified atrial fibrillation: Secondary | ICD-10-CM

## 2013-05-29 DIAGNOSIS — I2510 Atherosclerotic heart disease of native coronary artery without angina pectoris: Secondary | ICD-10-CM | POA: Insufficient documentation

## 2013-05-29 DIAGNOSIS — E785 Hyperlipidemia, unspecified: Secondary | ICD-10-CM | POA: Insufficient documentation

## 2013-05-29 DIAGNOSIS — Q251 Coarctation of aorta: Secondary | ICD-10-CM | POA: Insufficient documentation

## 2013-05-29 DIAGNOSIS — I4892 Unspecified atrial flutter: Secondary | ICD-10-CM

## 2013-05-29 DIAGNOSIS — I129 Hypertensive chronic kidney disease with stage 1 through stage 4 chronic kidney disease, or unspecified chronic kidney disease: Secondary | ICD-10-CM | POA: Insufficient documentation

## 2013-05-29 DIAGNOSIS — E669 Obesity, unspecified: Secondary | ICD-10-CM | POA: Insufficient documentation

## 2013-05-29 DIAGNOSIS — N189 Chronic kidney disease, unspecified: Secondary | ICD-10-CM | POA: Insufficient documentation

## 2013-05-29 HISTORY — PX: CARDIOVERSION: SHX1299

## 2013-05-29 SURGERY — CARDIOVERSION
Anesthesia: General

## 2013-05-29 MED ORDER — PROPOFOL INFUSION 10 MG/ML OPTIME
INTRAVENOUS | Status: DC | PRN
  Administered 2013-05-29: 150 mL via INTRAVENOUS

## 2013-05-29 MED ORDER — LIDOCAINE HCL (CARDIAC) 20 MG/ML IV SOLN
INTRAVENOUS | Status: DC | PRN
  Administered 2013-05-29: 50 mg via INTRAVENOUS

## 2013-05-29 MED ORDER — HYDROCORTISONE 1 % EX CREA
1.0000 "application " | TOPICAL_CREAM | Freq: Three times a day (TID) | CUTANEOUS | Status: DC | PRN
  Filled 2013-05-29: qty 28

## 2013-05-29 MED ORDER — SODIUM CHLORIDE 0.9 % IJ SOLN: 3.0000 mL | Freq: Two times a day (BID) | INTRAMUSCULAR | Status: DC

## 2013-05-29 MED ORDER — SODIUM CHLORIDE 0.9 % IJ SOLN: 3.0000 mL | INTRAMUSCULAR | Status: DC | PRN

## 2013-05-29 MED ORDER — SODIUM CHLORIDE 0.9 % IV SOLN
250.0000 mL | INTRAVENOUS | Status: DC
  Administered 2013-05-29: 500 mL via INTRAVENOUS
  Administered 2013-05-29: 11:00:00 via INTRAVENOUS

## 2013-05-29 NOTE — Anesthesia Postprocedure Evaluation (Signed)
  Anesthesia Post-op Note  Patient: Calvin [name]  Procedure(s) Performed: Procedure(s): CARDIOVERSION (N/A)  Patient Location: Endoscopy Unit  Anesthesia Type:General  Level of Consciousness: awake, alert , oriented and patient cooperative  Airway and Oxygen Therapy: Patient Spontanous Breathing  Post-op Pain: none  Post-op Assessment: Post-op Vital signs reviewed, Patient's Cardiovascular Status Stable, Respiratory Function Stable, Patent Airway, No signs of Nausea or vomiting and Pain level controlled  Post-op Vital Signs: stable  Complications: No apparent anesthesia complications

## 2013-05-29 NOTE — H&P (Signed)
Calvin [name]  05/28/2013 11:00 AM   Office Visit  MRN:  [id number]   Description: 58 year old male  Provider: Lelon Perla, MD  Department: [facility]        Referring Provider    Lilian Coma, MD      Diagnoses    Atrial fibrillation with controlled ventricular response    -  Primary    427.31    Coarctation of aorta        747.10    Hyperlipidemia        272.4    Hypertension        401.9    S/P aortic valve replacement        V43.3    Thoracic aortic aneurysm        441.2      Reason for Visit    Follow-up    Atrial fibrillation         Progress Notes    Lelon Perla, MD at 05/28/2013  9:33 AM    Status: Signed                     HPI: Pleasant male with past medical history of paroxysmal atrial fibrillation/flutter, history of aortic valve replacement, aortic coarctation, aortic aneurysm, coronary artery disease on CT scan and renal insufficiency for fu. Patient is s/p coarctation repair in 1958 and redo in 1971. Had endovascular stent in 1995 and 1996. Had aortic balloon valvuloplasty in 1974 and AVR in 1994. 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 Stent in distal arch, proximal descending aorta patent. Mildly enlarged lymph nodes. He is seen at the [facility] clinic on a yearly basis for followup of above. Patient also has atrial flutter. We cardioverted him previously and his dyspnea on exertion improved. Echocardiogram in December of 2014 showed normal LV function, mechanical aortic valve, moderate left atrial enlargement and stable gradient across presumed coarctation repair. Patient last seen in [place] in July. Echocardiogram showed thoracic aortic aneurysm with stable in size. CTA was recommended if increased in size by echo. Patient presented for an electrocardiogram from December 29 and was found to be in recurrent atrial flutter. Since then, he note some fatigue and dyspnea on exertion. Occasional  palpitations. No chest pain.      Current Outpatient Prescriptions   Medication  Sig  Dispense  Refill   .  acetaminophen (TYLENOL) 500 MG tablet  Take 500 mg by mouth as needed. For pain         .  amLODipine (NORVASC) 10 MG tablet  Take 1 tablet (10 mg total) by mouth daily.   90 tablet   3   .  amoxicillin (AMOXIL) 500 MG capsule  Before dental appts.         Marland Kitchen  b complex vitamins tablet  Take 1 tablet by mouth 2 (two) times daily.          .  cloNIDine (CATAPRES) 0.1 MG tablet  Take 0.1 mg by mouth 3 (three) times daily.         Marland Kitchen  COLCRYS 0.6 MG tablet  Take 0.6 mg by mouth as needed.          .  cyclobenzaprine (FLEXERIL) 10 MG tablet  Take 5 mg by mouth at bedtime as needed.          .  Febuxostat (ULORIC) 80 MG TABS  Take by mouth every morning.         Marland Kitchen  furosemide (LASIX) 20 MG tablet  Take 20 mg by mouth daily. Takes 40 mg if he feels he needs it         .  HYDROcodone-acetaminophen (VICODIN) 5-500 MG per tablet  Take 1 tablet by mouth every 6 (six) hours as needed for pain.   30 tablet   2   .  labetalol (NORMODYNE) 200 MG tablet  2 tabs am 1 tab pm   90 tablet   3   .  metoprolol (LOPRESSOR) 100 MG tablet  TAKE 1 TABLET BY MOUTH ONCE DAILY.   90 tablet   3   .  olmesartan (BENICAR) 40 MG tablet  Take 1 tablet (40 mg total) by mouth daily.   90 tablet   3   .  pantoprazole (PROTONIX) 40 MG tablet  Take 40 mg by mouth daily.          .  predniSONE (DELTASONE) 5 MG tablet  Take 5 mg by mouth. "As needed for gout"         .  sildenafil (VIAGRA) 50 MG tablet  Take 50 mg by mouth daily as needed.          .  Testosterone 10 MG/ACT (2%) GEL  APPLY 2 PUMPS ONCE DAILY   60 g   3   .  VOLTAREN 1 % GEL  Apply 2 g topically as needed.          .  warfarin (COUMADIN) 7.5 MG tablet  Take by mouth as directed.              No current facility-administered medications for this visit.           Past Medical History   Diagnosis  Date   .  Seminoma  1992       s/p resection and  radiation   .  HTN (hypertension)     .  CAD (coronary artery disease)     .  Aortic aneurysm     .  Gout     .  Atrial fibrillation     .  Anemia     .  Hyperlipidemia     .  Atrial flutter     .  Obesity     .  Anemia of renal disease  10/04/2011   .  CKD (chronic kidney disease)         baseline creatinine is 1.5-2   .  Gastric polyp  2007, 06/2012       benign.   .  Colonic ulcer  2007       felt to be secondary to NSAID   .  PONV (postoperative nausea and vomiting)     .  Testicular adenoma           Past Surgical History   Procedure  Laterality  Date   .  Coarctation of aorta repair    1958   .  Coarctation of aorta repair    1970   .  Aortic valve replacement and mitral valve repair    1974       Bicuspid aortic valve   .  Orchiectomy    1992       testicular cancer   .  Aortic valve replacement    1995       [facility]   .  Tonsillectomy       .  Cardioversion    02/27/2012  Procedure: CARDIOVERSION;  Surgeon: Lelon Perla, MD;  Location: [facility] ENDOSCOPY;  Service: Cardiovascular;  Laterality: N/A;   .  Esophagogastroduodenoscopy  N/A  07/04/2012       Procedure: ESOPHAGOGASTRODUODENOSCOPY (EGD);  Surgeon: Inda Castle, MD;  Location: [facility];  Service: Endoscopy;  Laterality: N/A;         History       Social History   .  Marital Status:  Married       Spouse Name:  N/A       Number of Children:  4   .  Years of Education:  N/A       Occupational History   .  Not on file.       Social History Main Topics   .  Smoking status:  Former Smoker -- 0.25 packs/day for 7 years       Types:  Cigarettes       Start date:  05/28/1975       Quit date:  05/27/1981   .  Smokeless tobacco:  Never Used         Comment: quit 25 years ago   .  Alcohol Use:  Yes         Comment: wine, occasionally   .  Drug Use:  No   .  Sexual Activity:  Not on file       Other Topics  Concern   .  Not on file       Social History Narrative     Pt lives  in [place] with wife.  3 children ages 94,20,22 (all 3 are adopted)     Sales grinding wheels.        ROS: no fevers or chills, productive cough, hemoptysis, dysphasia, odynophagia, melena, hematochezia, dysuria, hematuria, rash, seizure activity, orthopnea, PND, pedal edema, claudication. Remaining systems are negative.   Physical Exam: Well-developed well-nourished in no acute distress.   Skin is warm and dry.   HEENT is normal.   Neck is supple.   Chest is clear to auscultation with normal expansion.   Cardiovascular exam is irregular, 2/6 systolic murmur left sternal border. Abdominal exam nontender or distended. No masses palpated. Extremities show trace to 1+ edema. neuro grossly intact   ECG atrial flutter versus ectopic atrial tachycardia. IVCD. Nonspecific T-wave changes.                  History of atrial flutter s/p DCCV - Lelon Perla, MD at 05/28/2013 11:27 AM    Status: Written Related Problem: History of atrial flutter s/p DCCV    Patient has developed recurrent atrial flutter versus ectopic atrial rhythm. He is symptomatic with fatigue and increased dyspnea on exertion. His last cardioversion was greater than one year ago. His INR has been therapeutic. Proceed with cardioversion. If he is not hold sinus rhythm and remains symptomatic we will refer for ablation versus consider amiodarone. Continue Coumadin.         Coarctation of aorta (previous repair and stent) - Lelon Perla, MD at 05/28/2013 11:28 AM    Status: Written Related Problem: Coarctation of aorta (previous repair and stent)    Recent echocardiogram shows no change. Followed at [facility].         Hyperlipidemia - Lelon Perla, MD at 05/28/2013 11:29 AM    Status: Written Related Problem: Hyperlipidemia    Managed by primary care.         Hypertension - Aaron Edelman  Jacalyn Lefevre, MD at 05/28/2013 11:29 AM    Status: Written Related Problem: Hypertension    Continue present blood  pressure medications.         S/P aortic valve replacement - Lelon Perla, MD at 05/28/2013 11:29 AM    Status: Written Related Problem: S/P aortic valve replacement    Continue SBE prophylaxis. Continue Coumadin.         Thoracic aortic aneurysm - Lelon Perla, MD at 05/28/2013 11:29 AM    Status: Written Related Problem: Thoracic aortic aneurysm    Remains enlarged by echo but unchanged. He is also followed at the [facility] clinic. Plan is to continue echoes. If there is further dilatation we would need to consider a CTA despite renal insufficiency.             Encounter-Level Documents:    Electronic signature on 05/28/2013 10:59 AM          Vitals - Last Recorded    BP Pulse Ht Wt BMI      140/70 61 6\' 2"  (1.88 m) 313 lb (141.976 kg) 40.17 kg/m2  For DCCV; no changes. Kirk Ruths

## 2013-05-29 NOTE — Discharge Instructions (Signed)
Electrical Cardioversion °Electrical cardioversion is the delivery of a jolt of electricity to change the rhythm of the heart. Sticky patches or metal paddles are placed on the chest to deliver the electricity from a device. This is done to restore a normal rhythm. A rhythm that is too fast or not regular keeps the heart from pumping well. °Electrical cardioversion is done in an emergency if:  °· There is low or no blood pressure as a result of the heart rhythm.   °· Normal rhythm must be restored as fast as possible to protect the brain and heart from further damage.   °· It may save a life. °Cardioversion may be done for heart rhythms that are not immediately life-threatening, such as atrial fibrillation or flutter, in which:  °· The heart is beating too fast or is not regular.   °· Medicine to change the rhythm has not worked.   °· It is safe to wait in order to allow time for preparation. °· Symptoms of the abnormal rhythm are bothersome. °· The risk of stroke and other serious complications can be reduced. °LET YOUR CAREGIVER KNOW ABOUT:  °· All medicines you are taking, including vitamins, herbs, eye drops, creams, and over-the-counter medicines.   °· Previous problems you or members of your family have had with the use of anesthetics.   °· Any blood disorders you have.   °· Previous surgeries you have had.   °· Medical conditions you have. °RISKS AND COMPLICATIONS  °Generally, this is a safe procedure. However, as with any procedure, complications can occur. Possible complications include:  °· Breathing problems related to the anesthetic used. °· Cardiac arrest This risk is rare. °· A blood clot that breaks free and travels to other parts of your body. This could cause a stroke or other problems. The risk of this is lowered by use of blood thinning medicine (anticoagulant) prior to the procedure. °BEFORE THE PROCEDURE  °· You may have tests to detect blood clots in your heart and evaluate heart  function.  °· You may start taking anticoagulants so your blood does not clot as easily.   °· Medicines may be given to help stabilize your heart rate and rhythm. °PROCEDURE °· You will be given medicine through an IV tube to reduce discomfort and make you sleepy (sedative).   °· An electrical shock will be delivered. °AFTER THE PROCEDURE °Your heart rhythm will be watched to make sure it does not change. You may be able to go home within a few hours.  °Document Released: 04/07/2002 Document Revised: 02/05/2013 Document Reviewed: 10/30/2012 °Exit[employer]® Patient Information ©2014 [employer]. ° °

## 2013-05-29 NOTE — Transfer of Care (Signed)
Immediate Anesthesia Transfer of Care Note  Patient: Calvin [name]  Procedure(s) Performed: Procedure(s): CARDIOVERSION (N/A)  Patient Location: PACU  Anesthesia Type:General  Level of Consciousness: awake, alert  and oriented  Airway & Oxygen Therapy: Patient Spontanous Breathing and Patient connected to nasal cannula oxygen  Post-op Assessment: Report given to PACU RN and Post -op Vital signs reviewed and stable  Post vital signs: Reviewed and stable  Complications: No apparent anesthesia complications

## 2013-05-29 NOTE — Preoperative (Signed)
Beta Blockers   Last taken toprol on 05-27-13

## 2013-05-29 NOTE — Procedures (Signed)
Electrical Cardioversion Procedure Note EYAL TANTILLO [id number] Aug 28, 1955  Procedure: Electrical Cardioversion Indications:  Atrial Fibrillation  Procedure Details Consent: Risks of procedure as well as the alternatives and risks of each were explained to the (patient/caregiver).  Consent for procedure obtained. Time Out: Verified patient identification, verified procedure, site/side was marked, verified correct patient position, special equipment/implants available, medications/allergies/relevent history reviewed, required imaging and test results available.  Performed  Patient placed on cardiac monitor, pulse oximetry, supplemental oxygen as necessary.  Sedation given: Patient sedated by anesthesia with lidocaine 40 mg and diprovan 150 mg IV. Pacer pads placed anterior and posterior chest.  Cardioverted 1 time(s).  Cardioverted at 120J.  Evaluation Findings: Post procedure EKG shows: NSR Complications: None Patient did tolerate procedure well.   Kirk Ruths 05/29/2013, 10:58 AM

## 2013-05-29 NOTE — Anesthesia Preprocedure Evaluation (Addendum)
Anesthesia Evaluation  Patient identified by MRN, date of birth, ID band Patient awake    Reviewed: Allergy & Precautions, H&P , NPO status , Patient's Chart, lab work & pertinent test results  History of Anesthesia Complications (+) PONV  Airway Mallampati: II TM Distance: >3 FB     Dental  (+) Dental Advisory Given and Teeth Intact   Pulmonary former smoker,    Pulmonary exam normal       Cardiovascular hypertension, Pt. on home beta blockers + CAD and + Peripheral Vascular Disease + dysrhythmias Atrial Fibrillation + Valvular Problems/Murmurs AS and MR Rhythm:Irregular Rate:Tachycardia     Neuro/Psych    GI/Hepatic PUD,   Endo/Other    Renal/GU CRFRenal disease     Musculoskeletal  (+) Arthritis -, Osteoarthritis,    Abdominal   Peds  Hematology  (+) anemia ,   Anesthesia Other Findings   Reproductive/Obstetrics                        Anesthesia Physical Anesthesia Plan  ASA: III  Anesthesia Plan: General   Post-op Pain Management:    Induction: Intravenous  Airway Management Planned: Mask  Additional Equipment:   Intra-op Plan:   Post-operative Plan:   Informed Consent:   Dental advisory given  Plan Discussed with: CRNA and Anesthesiologist  Anesthesia Plan Comments:        Anesthesia Quick Evaluation

## 2013-05-30 ENCOUNTER — Encounter: Payer: Self-pay | Admitting: Cardiology

## 2013-05-30 ENCOUNTER — Encounter (HOSPITAL_COMMUNITY): Payer: Self-pay | Admitting: Cardiology

## 2013-05-30 LAB — BASIC METABOLIC PANEL
BUN: 67 mg/dL — ABNORMAL HIGH (ref 6–23)
CO2: 20 mEq/L (ref 19–32)
Calcium: 9.4 mg/dL (ref 8.4–10.5)
Chloride: 108 mEq/L (ref 96–112)
Creatinine, Ser: 3.12 mg/dL — ABNORMAL HIGH (ref 0.50–1.35)
GLUCOSE: 77 mg/dL (ref 70–99)
Potassium: 5 mEq/L (ref 3.5–5.3)
SODIUM: 139 meq/L (ref 135–145)

## 2013-05-30 LAB — TRANSFERRIN RECEPTOR, SOLUABLE: Transferrin Receptor, Soluble: 2.34 mg/L — ABNORMAL HIGH (ref 0.76–1.76)

## 2013-05-30 LAB — TESTOSTERONE: Testosterone: 897 ng/dL — ABNORMAL HIGH (ref 300–890)

## 2013-06-23 ENCOUNTER — Other Ambulatory Visit: Payer: Self-pay | Admitting: Nurse Practitioner

## 2013-06-23 ENCOUNTER — Other Ambulatory Visit (HOSPITAL_BASED_OUTPATIENT_CLINIC_OR_DEPARTMENT_OTHER): Payer: BC Managed Care – PPO | Admitting: Lab

## 2013-06-23 DIAGNOSIS — D649 Anemia, unspecified: Secondary | ICD-10-CM

## 2013-06-23 DIAGNOSIS — C6290 Malignant neoplasm of unspecified testis, unspecified whether descended or undescended: Secondary | ICD-10-CM

## 2013-06-23 DIAGNOSIS — N183 Chronic kidney disease, stage 3 unspecified: Secondary | ICD-10-CM

## 2013-06-23 DIAGNOSIS — D509 Iron deficiency anemia, unspecified: Secondary | ICD-10-CM

## 2013-06-23 DIAGNOSIS — I070 Rheumatic tricuspid stenosis: Secondary | ICD-10-CM

## 2013-06-23 DIAGNOSIS — E349 Endocrine disorder, unspecified: Secondary | ICD-10-CM

## 2013-06-23 LAB — CBC WITH DIFFERENTIAL (CANCER CENTER ONLY)
BASO#: 0 10*3/uL (ref 0.0–0.2)
BASO%: 0.6 % (ref 0.0–2.0)
EOS%: 4.8 % (ref 0.0–7.0)
Eosinophils Absolute: 0.2 10*3/uL (ref 0.0–0.5)
HCT: 32.8 % — ABNORMAL LOW (ref 38.7–49.9)
HGB: 11.3 g/dL — ABNORMAL LOW (ref 13.0–17.1)
LYMPH#: 1.1 10*3/uL (ref 0.9–3.3)
LYMPH%: 23.2 % (ref 14.0–48.0)
MCH: 28.8 pg (ref 28.0–33.4)
MCHC: 34.5 g/dL (ref 32.0–35.9)
MCV: 84 fL (ref 82–98)
MONO#: 0.4 10*3/uL (ref 0.1–0.9)
MONO%: 9 % (ref 0.0–13.0)
NEUT#: 3 10*3/uL (ref 1.5–6.5)
NEUT%: 62.4 % (ref 40.0–80.0)
PLATELETS: 205 10*3/uL (ref 145–400)
RBC: 3.93 10*6/uL — AB (ref 4.20–5.70)
RDW: 13.8 % (ref 11.1–15.7)
WBC: 4.8 10*3/uL (ref 4.0–10.0)

## 2013-06-23 LAB — PROTIME-INR (CHCC SATELLITE)
INR: 3.5 (ref 2.0–3.5)
PROTIME: 42 s — AB (ref 10.6–13.4)

## 2013-06-23 MED ORDER — TESTOSTERONE 10 MG/ACT (2%) TD GEL: 40.0000 mg | Freq: Every day | TRANSDERMAL | Status: DC

## 2013-06-24 ENCOUNTER — Telehealth: Payer: Self-pay | Admitting: Cardiology

## 2013-06-24 ENCOUNTER — Other Ambulatory Visit: Payer: Self-pay

## 2013-06-24 MED ORDER — CLONIDINE HCL 0.1 MG PO TABS: 0.1000 mg | ORAL_TABLET | Freq: Three times a day (TID) | ORAL | Status: DC

## 2013-06-24 NOTE — Telephone Encounter (Deleted)
Error

## 2013-06-26 LAB — BASIC METABOLIC PANEL
BUN: 73 mg/dL — AB (ref 6–23)
CHLORIDE: 107 meq/L (ref 96–112)
CO2: 20 mEq/L (ref 19–32)
CREATININE: 2.73 mg/dL — AB (ref 0.50–1.35)
Calcium: 9 mg/dL (ref 8.4–10.5)
GLUCOSE: 94 mg/dL (ref 70–99)
POTASSIUM: 5.2 meq/L (ref 3.5–5.3)
Sodium: 138 mEq/L (ref 135–145)

## 2013-06-26 LAB — TRANSFERRIN RECEPTOR, SOLUABLE: Transferrin Receptor, Soluble: 1.28 mg/L (ref 0.76–1.76)

## 2013-06-26 LAB — TESTOSTERONE: Testosterone: 974 ng/dL — ABNORMAL HIGH (ref 300–890)

## 2013-07-14 ENCOUNTER — Telehealth: Payer: Self-pay | Admitting: Hematology & Oncology

## 2013-07-14 NOTE — Telephone Encounter (Signed)
Pt cx 3-27 scheduled 3-17 lab and inj and 4-30 follow up. MD is aware

## 2013-07-15 ENCOUNTER — Other Ambulatory Visit (HOSPITAL_BASED_OUTPATIENT_CLINIC_OR_DEPARTMENT_OTHER): Payer: BC Managed Care – PPO | Admitting: Lab

## 2013-07-15 ENCOUNTER — Ambulatory Visit: Payer: BC Managed Care – PPO

## 2013-07-15 DIAGNOSIS — N183 Chronic kidney disease, stage 3 unspecified: Secondary | ICD-10-CM

## 2013-07-15 DIAGNOSIS — E291 Testicular hypofunction: Secondary | ICD-10-CM

## 2013-07-15 DIAGNOSIS — E349 Endocrine disorder, unspecified: Secondary | ICD-10-CM

## 2013-07-15 DIAGNOSIS — N289 Disorder of kidney and ureter, unspecified: Secondary | ICD-10-CM

## 2013-07-15 DIAGNOSIS — I070 Rheumatic tricuspid stenosis: Secondary | ICD-10-CM

## 2013-07-15 DIAGNOSIS — D649 Anemia, unspecified: Secondary | ICD-10-CM

## 2013-07-15 DIAGNOSIS — Z7901 Long term (current) use of anticoagulants: Secondary | ICD-10-CM

## 2013-07-15 DIAGNOSIS — D509 Iron deficiency anemia, unspecified: Secondary | ICD-10-CM

## 2013-07-15 LAB — CBC WITH DIFFERENTIAL (CANCER CENTER ONLY)
BASO#: 0 10*3/uL (ref 0.0–0.2)
BASO%: 0.6 % (ref 0.0–2.0)
EOS%: 3.7 % (ref 0.0–7.0)
Eosinophils Absolute: 0.2 10*3/uL (ref 0.0–0.5)
HCT: 32.4 % — ABNORMAL LOW (ref 38.7–49.9)
HGB: 11.2 g/dL — ABNORMAL LOW (ref 13.0–17.1)
LYMPH#: 1.2 10*3/uL (ref 0.9–3.3)
LYMPH%: 24.5 % (ref 14.0–48.0)
MCH: 28.7 pg (ref 28.0–33.4)
MCHC: 34.6 g/dL (ref 32.0–35.9)
MCV: 83 fL (ref 82–98)
MONO#: 0.3 10*3/uL (ref 0.1–0.9)
MONO%: 7 % (ref 0.0–13.0)
NEUT#: 3.1 10*3/uL (ref 1.5–6.5)
NEUT%: 64.2 % (ref 40.0–80.0)
PLATELETS: 215 10*3/uL (ref 145–400)
RBC: 3.9 10*6/uL — ABNORMAL LOW (ref 4.20–5.70)
RDW: 13.6 % (ref 11.1–15.7)
WBC: 4.9 10*3/uL (ref 4.0–10.0)

## 2013-07-15 LAB — PROTIME-INR (CHCC SATELLITE)
INR: 2.9 (ref 2.0–3.5)
Protime: 34.8 Seconds — ABNORMAL HIGH (ref 10.6–13.4)

## 2013-07-15 NOTE — Progress Notes (Unsigned)
Copies of labs electronically sent to Dr. Valora Piccolo and Dr. Emilio Math.

## 2013-07-18 ENCOUNTER — Other Ambulatory Visit: Payer: Self-pay | Admitting: *Deleted

## 2013-07-18 DIAGNOSIS — D509 Iron deficiency anemia, unspecified: Secondary | ICD-10-CM

## 2013-07-18 LAB — BASIC METABOLIC PANEL WITH GFR
BUN: 58 mg/dL — ABNORMAL HIGH (ref 6–23)
CO2: 22 meq/L (ref 19–32)
Calcium: 9 mg/dL (ref 8.4–10.5)
Chloride: 103 meq/L (ref 96–112)
Creatinine, Ser: 2.67 mg/dL — ABNORMAL HIGH (ref 0.50–1.35)
Glucose, Bld: 114 mg/dL — ABNORMAL HIGH (ref 70–99)
Potassium: 4.5 meq/L (ref 3.5–5.3)
Sodium: 139 meq/L (ref 135–145)

## 2013-07-18 LAB — TRANSFERRIN RECEPTOR, SOLUABLE: Transferrin Receptor, Soluble: 0.9 mg/L (ref 0.76–1.76)

## 2013-07-18 LAB — TESTOSTERONE: Testosterone: 555 ng/dL (ref 300–890)

## 2013-07-18 MED ORDER — OLMESARTAN MEDOXOMIL 40 MG PO TABS: 40.0000 mg | ORAL_TABLET | Freq: Every day | ORAL | Status: DC

## 2013-07-23 ENCOUNTER — Other Ambulatory Visit: Payer: Self-pay

## 2013-07-23 ENCOUNTER — Telehealth: Payer: Self-pay | Admitting: Hematology & Oncology

## 2013-07-23 MED ORDER — LABETALOL HCL 200 MG PO TABS: ORAL_TABLET | ORAL | Status: DC

## 2013-07-23 NOTE — Telephone Encounter (Signed)
Faxed Medical Records via fax today  to:  [facility] Ph: [phone] Fx: [phone]   Medical  Records requested from last 2 years and last EKG   CONSENT COPY SCANNED

## 2013-07-25 ENCOUNTER — Ambulatory Visit: Payer: BC Managed Care – PPO | Admitting: Hematology & Oncology

## 2013-07-25 ENCOUNTER — Other Ambulatory Visit: Payer: BC Managed Care – PPO | Admitting: Lab

## 2013-08-27 ENCOUNTER — Other Ambulatory Visit: Payer: Self-pay

## 2013-08-27 MED ORDER — LABETALOL HCL 200 MG PO TABS: ORAL_TABLET | ORAL | Status: DC

## 2013-08-28 ENCOUNTER — Encounter: Payer: Self-pay | Admitting: Hematology & Oncology

## 2013-08-28 ENCOUNTER — Ambulatory Visit (HOSPITAL_BASED_OUTPATIENT_CLINIC_OR_DEPARTMENT_OTHER): Payer: BC Managed Care – PPO | Admitting: Hematology & Oncology

## 2013-08-28 ENCOUNTER — Other Ambulatory Visit (HOSPITAL_BASED_OUTPATIENT_CLINIC_OR_DEPARTMENT_OTHER): Payer: BC Managed Care – PPO | Admitting: Lab

## 2013-08-28 VITALS — BP 140/50 | HR 69 | Temp 97.9°F | Resp 18 | Ht 72.0 in | Wt 312.0 lb

## 2013-08-28 DIAGNOSIS — D631 Anemia in chronic kidney disease: Secondary | ICD-10-CM

## 2013-08-28 DIAGNOSIS — Z954 Presence of other heart-valve replacement: Secondary | ICD-10-CM

## 2013-08-28 DIAGNOSIS — E349 Endocrine disorder, unspecified: Secondary | ICD-10-CM

## 2013-08-28 DIAGNOSIS — E291 Testicular hypofunction: Secondary | ICD-10-CM

## 2013-08-28 DIAGNOSIS — N039 Chronic nephritic syndrome with unspecified morphologic changes: Secondary | ICD-10-CM

## 2013-08-28 DIAGNOSIS — I070 Rheumatic tricuspid stenosis: Secondary | ICD-10-CM

## 2013-08-28 DIAGNOSIS — N189 Chronic kidney disease, unspecified: Secondary | ICD-10-CM

## 2013-08-28 DIAGNOSIS — N183 Chronic kidney disease, stage 3 unspecified: Secondary | ICD-10-CM

## 2013-08-28 DIAGNOSIS — D509 Iron deficiency anemia, unspecified: Secondary | ICD-10-CM

## 2013-08-28 LAB — CBC WITH DIFFERENTIAL (CANCER CENTER ONLY)
BASO#: 0 10*3/uL (ref 0.0–0.2)
BASO%: 0.5 % (ref 0.0–2.0)
EOS%: 2.9 % (ref 0.0–7.0)
Eosinophils Absolute: 0.2 10*3/uL (ref 0.0–0.5)
HCT: 32.6 % — ABNORMAL LOW (ref 38.7–49.9)
HGB: 11.4 g/dL — ABNORMAL LOW (ref 13.0–17.1)
LYMPH#: 1.2 10*3/uL (ref 0.9–3.3)
LYMPH%: 19.8 % (ref 14.0–48.0)
MCH: 29.8 pg (ref 28.0–33.4)
MCHC: 35 g/dL (ref 32.0–35.9)
MCV: 85 fL (ref 82–98)
MONO#: 0.4 10*3/uL (ref 0.1–0.9)
MONO%: 6 % (ref 0.0–13.0)
NEUT#: 4.2 10*3/uL (ref 1.5–6.5)
NEUT%: 70.8 % (ref 40.0–80.0)
PLATELETS: 224 10*3/uL (ref 145–400)
RBC: 3.82 10*6/uL — AB (ref 4.20–5.70)
RDW: 14.5 % (ref 11.1–15.7)
WBC: 5.9 10*3/uL (ref 4.0–10.0)

## 2013-08-28 LAB — PROTIME-INR (CHCC SATELLITE)
INR: 2 (ref 2.0–3.5)
PROTIME: 24 s — AB (ref 10.6–13.4)

## 2013-08-29 NOTE — Progress Notes (Signed)
Hematology and Oncology Follow Up Visit  Calvin [name] [id number] [birth year] 58 y.o. 08/29/2013   Principle Diagnosis:  1. Anemia of renal insufficiency. 2. Intermittent iron-deficiency anemia. 3. 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 4. Chronic renal insufficiency. 5. Hypotestosteronemia.   Current Therapy:   1. Aranesp 300 mcg subcu as needed for hemoglobin less than 11. 2. IV iron as indicated. 3. Coumadin - lifelong.     Interim History:  Mr.  Calvin [name] is back for followup. He's been doing fairly well. He still working. He's had no problems with his mechanical tricuspid valve. He's had no bleeding from the and Coumadin.  He did have his heart shocked since last saw him. He goes into atrial fibrillation and flutter. This is helped.  He does get iron when necessary. He does get Aranesp. These do make him feel better.  He's had no bleeding. He's had no fever. He's had some occasional leg swelling.  Medications: Current outpatient prescriptions:acetaminophen (TYLENOL) 500 MG tablet, Take 500 mg by mouth as needed. For pain, Disp: , Rfl: ;  amLODipine (NORVASC) 10 MG tablet, Take 1 tablet (10 mg total) by mouth daily., Disp: 90 tablet, Rfl: 3;  b complex vitamins tablet, Take 1 tablet by mouth daily. , Disp: , Rfl: ;  cloNIDine (CATAPRES) 0.1 MG tablet, Take 1 tablet (0.1 mg total) by mouth 3 (three) times daily., Disp: 270 tablet, Rfl: 3 COLCRYS 0.6 MG tablet, Take 0.6 mg by mouth as needed. , Disp: , Rfl: ;  cyclobenzaprine (FLEXERIL) 10 MG tablet, Take 5 mg by mouth at bedtime as needed. , Disp: , Rfl: ;  furosemide (LASIX) 20 MG tablet, Take 20 mg by mouth daily. May take 40 mg if he feels he needs it, Disp: , Rfl: ;  HYDROcodone-acetaminophen (VICODIN) 5-500 MG per tablet, Take 1 tablet by mouth as needed for pain., Disp: , Rfl:  labetalol (NORMODYNE) 200 MG tablet, 2 tabs am 1 tab pm, Disp: 90 tablet, Rfl: 0;  metoprolol (LOPRESSOR) 100 MG tablet, TAKE 1 TABLET BY MOUTH  ONCE DAILY., Disp: 90 tablet, Rfl: 3;  olmesartan (BENICAR) 40 MG tablet, Take 1 tablet (40 mg total) by mouth daily., Disp: 90 tablet, Rfl: 0;  pantoprazole (PROTONIX) 40 MG tablet, Take 40 mg by mouth daily. , Disp: , Rfl:  sildenafil (VIAGRA) 50 MG tablet, Take 50 mg by mouth daily as needed. , Disp: , Rfl: ;  Testosterone (FORTESTA) 10 MG/ACT (2%) GEL, Place 40 mg onto the skin daily. (2 pumps to thigh), Disp: 60 g, Rfl: 3;  VOLTAREN 1 % GEL, Apply 2 g topically as needed. , Disp: , Rfl: ;  warfarin (COUMADIN) 7.5 MG tablet, Take by mouth every morning. TAKES 7.5 MG Q. DAY  EXCEPT Wednesday  HE TAKES 11.25 MG., Disp: , Rfl:  amoxicillin (AMOXIL) 500 MG capsule, Before dental appts., Disp: , Rfl: ;  predniSONE (DELTASONE) 5 MG tablet, Take 5 mg by mouth. "As needed for gout", Disp: , Rfl:   Allergies:  Allergies  Allergen Reactions  . Ace Inhibitors     Diffuse swelling No issues with Benicar    Past Medical History, Surgical history, Social history, and Family History were reviewed and updated.  Review of Systems: As above  Physical Exam:  height is 6' (1.829 m) and weight is 312 lb (141.522 kg). His oral temperature is 97.9 F (36.6 C). His blood pressure is 140/50 and his pulse is 69. His respiration is 18.   Obese white gentleman. Lungs are  clear. Cardiac exam regular rate and rhythm. He has a systolic click from his tricuspid valve. Abdomen is obese but soft. Has good bowel sounds. There is no fluid wave. There is a palpable liver or spleen tip. Back exam no tenderness over the spine. Extremities show some trace edema in his legs. Skin exam no ecchymoses or petechia. Neurological exam nonfocal.  Lab Results  Component Value Date   WBC 4.9 07/15/2013   HGB 11.2* 07/15/2013   HCT 32.4* 07/15/2013   MCV 83 07/15/2013   PLT 215 07/15/2013     Chemistry      Component Value Date/Time   NA 140 08/28/2013 0837   NA 136 04/28/2013 1104   K 4.3 08/28/2013 0837   K 4.9* 04/28/2013 1104    CL 105 08/28/2013 0837   CL 103 04/28/2013 1104   CO2 22 08/28/2013 0837   CO2 24 04/28/2013 1104   BUN 46* 08/28/2013 0837   BUN 68* 04/28/2013 1104   CREATININE 2.42* 08/28/2013 0837   CREATININE 2.9* 04/28/2013 1104      Component Value Date/Time   CALCIUM 9.1 08/28/2013 0837   CALCIUM 8.8 04/28/2013 1104   ALKPHOS 44 08/28/2013 0837   ALKPHOS 45 10/23/2012 1416   AST 15 08/28/2013 0837   AST 22 10/23/2012 1416   ALT 16 08/28/2013 0837   ALT 22 10/23/2012 1416   BILITOT 0.5 08/28/2013 0837   BILITOT 0.60 10/23/2012 1416         Impression and Plan: [name] is good sexual abdomen. He has anemia from renal insufficiency. He also has anemia secondary to iron deficiency. He has had some GI bleeding in the past. He also has low testosterone levels.  We will go ahead and continue to have his labs checked monthly. I will see him back myself in 2 or 3 months.     Volanda Napoleon, MD 5/1/20157:17 AM

## 2013-09-01 ENCOUNTER — Telehealth: Payer: Self-pay | Admitting: *Deleted

## 2013-09-01 NOTE — Telephone Encounter (Deleted)
Message copied by Lenn Sink on Mon Sep 01, 2013  8:51 AM ------      Message from: Volanda Napoleon      Created: Sun Aug 31, 2013  8:39 PM       Call - renal function is stable. testostrone is low.  Pete ------

## 2013-09-01 NOTE — Telephone Encounter (Addendum)
Message copied by Lenn Sink on Mon Sep 01, 2013  8:58 AM ------      Message from: Volanda Napoleon      Created: Sun Aug 31, 2013  8:39 PM       Call - renal function is stable. testostrone is low.  Pete ------Informed pt renal function is stable and his testosterone is low. Pt wants the office to send his CMP to his nephrologist, Dr. Emilio Math.

## 2013-09-03 LAB — COMPREHENSIVE METABOLIC PANEL WITH GFR
ALT: 16 U/L (ref 0–53)
AST: 15 U/L (ref 0–37)
Albumin: 4.1 g/dL (ref 3.5–5.2)
Alkaline Phosphatase: 44 U/L (ref 39–117)
BUN: 46 mg/dL — ABNORMAL HIGH (ref 6–23)
CO2: 22 meq/L (ref 19–32)
Calcium: 9.1 mg/dL (ref 8.4–10.5)
Chloride: 105 meq/L (ref 96–112)
Creatinine, Ser: 2.42 mg/dL — ABNORMAL HIGH (ref 0.50–1.35)
Glucose, Bld: 100 mg/dL — ABNORMAL HIGH (ref 70–99)
Potassium: 4.3 meq/L (ref 3.5–5.3)
Sodium: 140 meq/L (ref 135–145)
Total Bilirubin: 0.5 mg/dL (ref 0.2–1.2)
Total Protein: 6.3 g/dL (ref 6.0–8.3)

## 2013-09-03 LAB — SOLUBLE TRANSFERRIN RECEPTOR: Transferrin Receptor, Soluble: 1.32 mg/L (ref 0.76–1.76)

## 2013-09-03 LAB — TESTOSTERONE: Testosterone: 239 ng/dL — ABNORMAL LOW (ref 300–890)

## 2013-09-17 ENCOUNTER — Encounter: Payer: Self-pay | Admitting: Cardiology

## 2013-09-17 ENCOUNTER — Ambulatory Visit (INDEPENDENT_AMBULATORY_CARE_PROVIDER_SITE_OTHER): Payer: BC Managed Care – PPO | Admitting: Cardiology

## 2013-09-17 VITALS — BP 140/72 | HR 60 | Ht 74.0 in | Wt 315.0 lb

## 2013-09-17 DIAGNOSIS — I10 Essential (primary) hypertension: Secondary | ICD-10-CM

## 2013-09-17 DIAGNOSIS — Q251 Coarctation of aorta: Secondary | ICD-10-CM

## 2013-09-17 DIAGNOSIS — I712 Thoracic aortic aneurysm, without rupture, unspecified: Secondary | ICD-10-CM

## 2013-09-17 DIAGNOSIS — E785 Hyperlipidemia, unspecified: Secondary | ICD-10-CM

## 2013-09-17 DIAGNOSIS — I4891 Unspecified atrial fibrillation: Secondary | ICD-10-CM

## 2013-09-17 DIAGNOSIS — Z952 Presence of prosthetic heart valve: Secondary | ICD-10-CM

## 2013-09-17 DIAGNOSIS — Z954 Presence of other heart-valve replacement: Secondary | ICD-10-CM

## 2013-09-17 DIAGNOSIS — N183 Chronic kidney disease, stage 3 unspecified: Secondary | ICD-10-CM

## 2013-09-17 DIAGNOSIS — Z8679 Personal history of other diseases of the circulatory system: Secondary | ICD-10-CM

## 2013-09-17 MED ORDER — WARFARIN SODIUM 7.5 MG PO TABS: 7.5000 mg | ORAL_TABLET | Freq: Every morning | ORAL | Status: DC

## 2013-09-17 MED ORDER — OLMESARTAN MEDOXOMIL 40 MG PO TABS: 40.0000 mg | ORAL_TABLET | Freq: Every day | ORAL | Status: DC

## 2013-09-17 MED ORDER — LABETALOL HCL 200 MG PO TABS: ORAL_TABLET | ORAL | Status: DC

## 2013-09-17 NOTE — Progress Notes (Signed)
HPI: FU paroxysmal atrial fibrillation/flutter, history of aortic valve replacement, aortic coarctation, aortic aneurysm, coronary artery disease on CT scan and renal insufficiency. Patient is s/p coarctation repair in 1958 and redo in 1971. Had endovascular stent in 1995 and 1996. Had aortic balloon valvuloplasty in 1974 and AVR in 1994. 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 Stent in distal arch, proximal descending aorta patent. Mildly enlarged lymph nodes. He is seen at the [facility] clinic on a yearly basis for followup of above. Patient last seen in [place] in July 2014. Echocardiogram showed thoracic aortic aneurysm with stable in size. CTA was recommended if increased in size by echo (patient with renal insufficiency). Echocardiogram in December of 2014 showed normal LV function, mechanical aortic valve, moderate left atrial enlargement and stable gradient across presumed coarctation repair.  Patient presented for an electrocardiogram from December 29 and was found to be in recurrent atrial flutter. Underwent repeat cardioversion January 2015. Since then, Has dyspnea with more extreme activities. Chronic pedal edema. No chest pain, palpitations or syncope. Feels better in sinus rhythm.   Current Outpatient Prescriptions  Medication Sig Dispense Refill  . acetaminophen (TYLENOL) 500 MG tablet Take 500 mg by mouth as needed. For pain      . amLODipine (NORVASC) 10 MG tablet Take 1 tablet (10 mg total) by mouth daily.  90 tablet  3  . amoxicillin (AMOXIL) 500 MG capsule Before dental appts.      Marland Kitchen b complex vitamins tablet Take 1 tablet by mouth daily.       . cloNIDine (CATAPRES) 0.1 MG tablet Take 1 tablet (0.1 mg total) by mouth 3 (three) times daily.  270 tablet  3  . COLCRYS 0.6 MG tablet Take 0.6 mg by mouth as needed.       . cyclobenzaprine (FLEXERIL) 10 MG tablet Take 5 mg by mouth at bedtime as needed.       . furosemide (LASIX) 20 MG  tablet Take 20 mg by mouth daily. May take 40 mg if he feels he needs it      . HYDROcodone-acetaminophen (VICODIN) 5-500 MG per tablet Take 1 tablet by mouth as needed for pain.      Marland Kitchen labetalol (NORMODYNE) 200 MG tablet 2 tabs am 1 tab pm  90 tablet  0  . metoprolol (LOPRESSOR) 100 MG tablet TAKE 1 TABLET BY MOUTH ONCE DAILY.  90 tablet  3  . olmesartan (BENICAR) 40 MG tablet Take 1 tablet (40 mg total) by mouth daily.  90 tablet  0  . pantoprazole (PROTONIX) 40 MG tablet Take 40 mg by mouth daily.       . predniSONE (DELTASONE) 5 MG tablet Take 5 mg by mouth. "As needed for gout"      . sildenafil (VIAGRA) 50 MG tablet Take 50 mg by mouth daily as needed.       . Testosterone (FORTESTA) 10 MG/ACT (2%) GEL Place 40 mg onto the skin daily. (2 pumps to thigh)  60 g  3  . VOLTAREN 1 % GEL Apply 2 g topically as needed.       . warfarin (COUMADIN) 7.5 MG tablet Take by mouth every morning. TAKES 7.5 MG Q. DAY  EXCEPT Wednesday  HE TAKES 11.25 MG.       No current facility-administered medications for this visit.     Past Medical History  Diagnosis Date  . 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 HTN (hypertension)   . CAD (coronary artery disease)   . Aortic aneurysm   . Gout   . Atrial fibrillation   . Anemia   . Hyperlipidemia   . Atrial flutter   . Obesity   . Anemia of renal disease 10/04/2011  . CKD (chronic kidney disease)     baseline creatinine is 1.5-2  . Gastric polyp 2007, 06/2012    benign.  . Colonic ulcer 2007    felt to be secondary to NSAID  . Testicular adenoma   . PONV (postoperative nausea and vomiting)     Past Surgical History  Procedure Laterality Date  . Coarctation of aorta repair  1958  . Coarctation of aorta repair  1970  . Aortic valve replacement and mitral valve repair  1974    Bicuspid aortic valve  . Orchiectomy  1992    testicular cancer  . Aortic valve replacement  1995    [facility]  . Tonsillectomy    . Cardioversion   02/27/2012    Procedure: CARDIOVERSION;  Surgeon: Lelon Perla, MD;  Location: [facility] ENDOSCOPY;  Service: Cardiovascular;  Laterality: N/A;  . Esophagogastroduodenoscopy N/A 07/04/2012    Procedure: ESOPHAGOGASTRODUODENOSCOPY (EGD);  Surgeon: Inda Castle, MD;  Location: [place];  Service: Endoscopy;  Laterality: N/A;  . Cardioversion N/A 05/29/2013    Procedure: CARDIOVERSION;  Surgeon: Lelon Perla, MD;  Location: [facility] ENDOSCOPY;  Service: Cardiovascular;  Laterality: N/A;    History   Social History  . Marital Status: Married    Spouse Name: N/A    Number of Children: 4  . Years of Education: N/A   Occupational History  . Not on file.   Social History Main Topics  . Smoking status: Former Smoker -- 0.25 packs/day for 7 years    Types: Cigarettes    Start date: 05/28/1975    Quit date: 05/27/1981  . Smokeless tobacco: Never Used     Comment: quit 25 years ago  . Alcohol Use: Yes     Comment: wine, occasionally  . Drug Use: No  . Sexual Activity: Not on file   Other Topics Concern  . Not on file   Social History Narrative   Pt lives in [place] with wife.  3 children ages 50,20,22 (all 3 are adopted)   Sales grinding wheels.    ROS: no fevers or chills, productive cough, hemoptysis, dysphasia, odynophagia, melena, hematochezia, dysuria, hematuria, rash, seizure activity, orthopnea, PND, pedal edema, claudication. Remaining systems are negative.  Physical Exam: Well-developed well-nourished in no acute distress.  Skin is warm and dry.  HEENT is normal.  Neck is supple.  Chest is clear to auscultation with normal expansion.  Cardiovascular exam is regular rate and rhythm. 2/6 systolic murmur left sternal border Abdominal exam nontender or distended. No masses palpated. Extremities show 1+ edema. neuro grossly intact  ECG Sinus rhythm with first degree AV block. Normal axis. Nonspecific lateral T-wave changes.

## 2013-09-17 NOTE — Assessment & Plan Note (Signed)
Patient remains in sinus rhythm status post cardioversion. Continue beta blocker and Coumadin. If he develops recurrent atrial flutter in the future we will consider referral to electrophysiology for ablation versus adding an antiarrhythmic most likely amiodarone.

## 2013-09-17 NOTE — Assessment & Plan Note (Signed)
Blood pressure controlled. Continue present medications. 

## 2013-09-17 NOTE — Assessment & Plan Note (Signed)
Followed by nephrology. 

## 2013-09-17 NOTE — Assessment & Plan Note (Signed)
Managed by primary care. 

## 2013-09-17 NOTE — Assessment & Plan Note (Signed)
Would like to pursue MRI of thoracic aorta as outlined under thoracic aortic aneurysm.

## 2013-09-17 NOTE — Assessment & Plan Note (Addendum)
The patient has a history of thoracic aortic aneurysm followed at the [facility] clinic. He states he has been 5.6-5.8 cm in the past. Surgery felt not indicated as scar tissue from previous surgery would make rupture less likely. I would like to reassess his aortic root. However he has significant renal insufficiency with a creatinine of approximately 2.5. I would therefore like to avoid CTA due to the risk of contrast nephropathy. I would avoid MRA due to Risk of nephrosclerosis. I discussed this with Dr. Johnsie Cancel. He felt we could proceed with a noncontrast MRI of his aortic root which would evaluate both his coarctation and the size of his aneurysm. However he has significant claustrophobia. He would like to discuss this with the physicians at the [facility] clinic. We have recently been trying to follow his aortic aneurysm with echocardiogram which I do not think is adequate. He will contact us if he would like to perform the MRI here after his visit to [facility] scheduled for July.

## 2013-09-17 NOTE — Patient Instructions (Signed)
Your physician wants you to follow-up in: 6 MONTHS WITH DR CRENSHAW You will receive a reminder letter in the mail two months in advance. If you don't receive a letter, please call our office to schedule the follow-up appointment.  

## 2013-09-17 NOTE — Assessment & Plan Note (Signed)
Continue SBE prophylaxis. 

## 2013-09-26 ENCOUNTER — Other Ambulatory Visit (HOSPITAL_BASED_OUTPATIENT_CLINIC_OR_DEPARTMENT_OTHER): Payer: BC Managed Care – PPO | Admitting: Lab

## 2013-09-26 DIAGNOSIS — N183 Chronic kidney disease, stage 3 unspecified: Secondary | ICD-10-CM

## 2013-09-26 DIAGNOSIS — Z954 Presence of other heart-valve replacement: Secondary | ICD-10-CM

## 2013-09-26 DIAGNOSIS — I070 Rheumatic tricuspid stenosis: Secondary | ICD-10-CM

## 2013-09-26 DIAGNOSIS — D509 Iron deficiency anemia, unspecified: Secondary | ICD-10-CM

## 2013-09-26 DIAGNOSIS — N039 Chronic nephritic syndrome with unspecified morphologic changes: Secondary | ICD-10-CM

## 2013-09-26 DIAGNOSIS — D631 Anemia in chronic kidney disease: Secondary | ICD-10-CM

## 2013-09-26 DIAGNOSIS — E349 Endocrine disorder, unspecified: Secondary | ICD-10-CM

## 2013-09-26 LAB — CBC WITH DIFFERENTIAL (CANCER CENTER ONLY)
BASO#: 0 10*3/uL (ref 0.0–0.2)
BASO%: 0.3 % (ref 0.0–2.0)
EOS%: 3 % (ref 0.0–7.0)
Eosinophils Absolute: 0.2 10*3/uL (ref 0.0–0.5)
HEMATOCRIT: 30.6 % — AB (ref 38.7–49.9)
HGB: 10.8 g/dL — ABNORMAL LOW (ref 13.0–17.1)
LYMPH#: 1.3 10*3/uL (ref 0.9–3.3)
LYMPH%: 21.5 % (ref 14.0–48.0)
MCH: 30.3 pg (ref 28.0–33.4)
MCHC: 35.3 g/dL (ref 32.0–35.9)
MCV: 86 fL (ref 82–98)
MONO#: 0.4 10*3/uL (ref 0.1–0.9)
MONO%: 7.2 % (ref 0.0–13.0)
NEUT#: 4.1 10*3/uL (ref 1.5–6.5)
NEUT%: 68 % (ref 40.0–80.0)
Platelets: 234 10*3/uL (ref 145–400)
RBC: 3.56 10*6/uL — ABNORMAL LOW (ref 4.20–5.70)
RDW: 13.6 % (ref 11.1–15.7)
WBC: 6.1 10*3/uL (ref 4.0–10.0)

## 2013-09-26 LAB — FERRITIN CHCC: Ferritin: 1162 ng/ml — ABNORMAL HIGH (ref 22–316)

## 2013-09-26 LAB — IRON AND TIBC CHCC
%SAT: 38 % (ref 20–55)
Iron: 101 ug/dL (ref 42–163)
TIBC: 265 ug/dL (ref 202–409)
UIBC: 164 ug/dL (ref 117–376)

## 2013-09-26 LAB — PROTIME-INR (CHCC SATELLITE)
INR: 2.4 (ref 2.0–3.5)
Protime: 28.8 Seconds — ABNORMAL HIGH (ref 10.6–13.4)

## 2013-09-30 LAB — BASIC METABOLIC PANEL
BUN: 58 mg/dL — AB (ref 6–23)
CO2: 23 meq/L (ref 19–32)
CREATININE: 2.52 mg/dL — AB (ref 0.50–1.35)
Calcium: 9 mg/dL (ref 8.4–10.5)
Chloride: 103 mEq/L (ref 96–112)
Glucose, Bld: 94 mg/dL (ref 70–99)
Potassium: 4.6 mEq/L (ref 3.5–5.3)
Sodium: 136 mEq/L (ref 135–145)

## 2013-09-30 LAB — TESTOSTERONE: Testosterone: 900 ng/dL — ABNORMAL HIGH (ref 300–890)

## 2013-09-30 LAB — SOLUBLE TRANSFERRIN RECEPTOR: Transferrin Receptor, Soluble: 1.43 mg/L (ref 0.76–1.76)

## 2013-09-30 LAB — RETICULOCYTES (CHCC)
ABS Retic: 76.7 10*3/uL (ref 19.0–186.0)
RBC.: 3.65 MIL/uL — AB (ref 4.22–5.81)
RETIC CT PCT: 2.1 % (ref 0.4–2.3)

## 2013-10-15 ENCOUNTER — Other Ambulatory Visit: Payer: Self-pay | Admitting: Hematology & Oncology

## 2013-10-15 ENCOUNTER — Telehealth: Payer: Self-pay | Admitting: Hematology & Oncology

## 2013-10-15 DIAGNOSIS — D631 Anemia in chronic kidney disease: Secondary | ICD-10-CM

## 2013-10-15 DIAGNOSIS — N189 Chronic kidney disease, unspecified: Secondary | ICD-10-CM

## 2013-10-15 DIAGNOSIS — D509 Iron deficiency anemia, unspecified: Secondary | ICD-10-CM

## 2013-10-15 NOTE — Telephone Encounter (Signed)
Pt called said he didn't feel good made 6-18 appointment for lab and possible inj, per MD that's fine

## 2013-10-16 ENCOUNTER — Telehealth: Payer: Self-pay | Admitting: Hematology & Oncology

## 2013-10-16 ENCOUNTER — Other Ambulatory Visit (HOSPITAL_BASED_OUTPATIENT_CLINIC_OR_DEPARTMENT_OTHER): Payer: BC Managed Care – PPO | Admitting: Lab

## 2013-10-16 ENCOUNTER — Ambulatory Visit (HOSPITAL_BASED_OUTPATIENT_CLINIC_OR_DEPARTMENT_OTHER): Payer: BC Managed Care – PPO

## 2013-10-16 VITALS — BP 150/62 | HR 64 | Temp 97.0°F | Resp 20

## 2013-10-16 DIAGNOSIS — D630 Anemia in neoplastic disease: Secondary | ICD-10-CM

## 2013-10-16 DIAGNOSIS — N183 Chronic kidney disease, stage 3 unspecified: Secondary | ICD-10-CM

## 2013-10-16 DIAGNOSIS — C61 Malignant neoplasm of prostate: Secondary | ICD-10-CM

## 2013-10-16 DIAGNOSIS — D509 Iron deficiency anemia, unspecified: Secondary | ICD-10-CM

## 2013-10-16 DIAGNOSIS — N039 Chronic nephritic syndrome with unspecified morphologic changes: Secondary | ICD-10-CM

## 2013-10-16 DIAGNOSIS — D631 Anemia in chronic kidney disease: Secondary | ICD-10-CM

## 2013-10-16 DIAGNOSIS — N189 Chronic kidney disease, unspecified: Secondary | ICD-10-CM

## 2013-10-16 LAB — CMP (CANCER CENTER ONLY)
ALT(SGPT): 21 U/L (ref 10–47)
AST: 22 U/L (ref 11–38)
Albumin: 3.6 g/dL (ref 3.3–5.5)
Alkaline Phosphatase: 39 U/L (ref 26–84)
BILIRUBIN TOTAL: 0.8 mg/dL (ref 0.20–1.60)
BUN: 55 mg/dL — AB (ref 7–22)
CALCIUM: 9.1 mg/dL (ref 8.0–10.3)
CHLORIDE: 102 meq/L (ref 98–108)
CO2: 26 meq/L (ref 18–33)
CREATININE: 2.4 mg/dL — AB (ref 0.6–1.2)
Glucose, Bld: 115 mg/dL (ref 73–118)
Potassium: 4.4 mEq/L (ref 3.3–4.7)
Sodium: 141 mEq/L (ref 128–145)
Total Protein: 6.9 g/dL (ref 6.4–8.1)

## 2013-10-16 LAB — IRON AND TIBC CHCC
%SAT: 25 % (ref 20–55)
Iron: 65 ug/dL (ref 42–163)
TIBC: 260 ug/dL (ref 202–409)
UIBC: 195 ug/dL (ref 117–376)

## 2013-10-16 LAB — RETICULOCYTES (CHCC)
ABS Retic: 66.9 10*3/uL (ref 19.0–186.0)
RBC.: 3.52 MIL/uL — ABNORMAL LOW (ref 4.22–5.81)
RETIC CT PCT: 1.9 % (ref 0.4–2.3)

## 2013-10-16 LAB — FERRITIN CHCC

## 2013-10-16 LAB — CBC WITH DIFFERENTIAL (CANCER CENTER ONLY)
BASO#: 0 10*3/uL (ref 0.0–0.2)
BASO%: 0.4 % (ref 0.0–2.0)
EOS ABS: 0.2 10*3/uL (ref 0.0–0.5)
EOS%: 3.9 % (ref 0.0–7.0)
HEMATOCRIT: 30.1 % — AB (ref 38.7–49.9)
HEMOGLOBIN: 10.6 g/dL — AB (ref 13.0–17.1)
LYMPH#: 1.1 10*3/uL (ref 0.9–3.3)
LYMPH%: 19.4 % (ref 14.0–48.0)
MCH: 30.9 pg (ref 28.0–33.4)
MCHC: 35.2 g/dL (ref 32.0–35.9)
MCV: 88 fL (ref 82–98)
MONO#: 0.3 10*3/uL (ref 0.1–0.9)
MONO%: 6.1 % (ref 0.0–13.0)
NEUT#: 3.8 10*3/uL (ref 1.5–6.5)
NEUT%: 70.2 % (ref 40.0–80.0)
Platelets: 226 10*3/uL (ref 145–400)
RBC: 3.43 10*6/uL — ABNORMAL LOW (ref 4.20–5.70)
RDW: 12.7 % (ref 11.1–15.7)
WBC: 5.4 10*3/uL (ref 4.0–10.0)

## 2013-10-16 MED ORDER — DARBEPOETIN ALFA-POLYSORBATE 300 MCG/0.6ML IJ SOLN
300.0000 ug | Freq: Once | INTRAMUSCULAR | Status: AC
  Administered 2013-10-16: 300 ug via SUBCUTANEOUS

## 2013-10-16 MED ORDER — DARBEPOETIN ALFA-POLYSORBATE 300 MCG/0.6ML IJ SOLN
INTRAMUSCULAR | Status: AC
  Filled 2013-10-16: qty 0.6

## 2013-10-16 NOTE — Patient Instructions (Signed)
Please bring Living Will with you next time!    Darbepoetin Alfa injection What is this medicine? DARBEPOETIN ALFA (dar be POE e tin AL fa) helps your body make more red blood cells. It is used to treat anemia caused by chronic kidney failure and chemotherapy. This medicine may be used for other purposes; ask your health care provider or pharmacist if you have questions. COMMON BRAND NAME(S): Aranesp What should I tell my health care provider before I take this medicine? They need to know if you have any of these conditions: -blood clotting disorders or history of blood clots -cancer patient not on chemotherapy -cystic fibrosis -heart disease, such as angina, heart failure, or a history of a heart attack -hemoglobin level of 12 g/dL or greater -high blood pressure -low levels of folate, iron, or vitamin B12 -seizures -an unusual or allergic reaction to darbepoetin, erythropoietin, albumin, hamster proteins, latex, other medicines, foods, dyes, or preservatives -pregnant or trying to get pregnant -breast-feeding How should I use this medicine? This medicine is for injection into a vein or under the skin. It is usually given by a health care professional in a hospital or clinic setting. If you get this medicine at home, you will be taught how to prepare and give this medicine. Do not shake the solution before you withdraw a dose. Use exactly as directed. Take your medicine at regular intervals. Do not take your medicine more often than directed. It is important that you put your used needles and syringes in a special sharps container. Do not put them in a trash can. If you do not have a sharps container, call your pharmacist or healthcare provider to get one. Talk to your pediatrician regarding the use of this medicine in children. While this medicine may be used in children as young as 1 year for selected conditions, precautions do apply. Overdosage: If you think you have taken too much of  this medicine contact a poison control center or emergency room at once. NOTE: This medicine is only for you. Do not share this medicine with others. What if I miss a dose? If you miss a dose, take it as soon as you can. If it is almost time for your next dose, take only that dose. Do not take double or extra doses. What may interact with this medicine? Do not take this medicine with any of the following medications: -epoetin alfa This list may not describe all possible interactions. Give your health care provider a list of all the medicines, herbs, non-prescription drugs, or dietary supplements you use. Also tell them if you smoke, drink alcohol, or use illegal drugs. Some items may interact with your medicine. What should I watch for while using this medicine? Visit your prescriber or health care professional for regular checks on your progress and for the needed blood tests and blood pressure measurements. It is especially important for the doctor to make sure your hemoglobin level is in the desired range, to limit the risk of potential side effects and to give you the best benefit. Keep all appointments for any recommended tests. Check your blood pressure as directed. Ask your doctor what your blood pressure should be and when you should contact him or her. As your body makes more red blood cells, you may need to take iron, folic acid, or vitamin B supplements. Ask your doctor or health care provider which products are right for you. If you have kidney disease continue dietary restrictions, even though this medication  can make you feel better. Talk with your doctor or health care professional about the foods you eat and the vitamins that you take. What side effects may I notice from receiving this medicine? Side effects that you should report to your doctor or health care professional as soon as possible: -allergic reactions like skin rash, itching or hives, swelling of the face, lips, or  tongue -breathing problems -changes in vision -chest pain -confusion, trouble speaking or understanding -feeling faint or lightheaded, falls -high blood pressure -muscle aches or pains -pain, swelling, warmth in the leg -rapid weight gain -severe headaches -sudden numbness or weakness of the face, arm or leg -trouble walking, dizziness, loss of balance or coordination -seizures (convulsions) -swelling of the ankles, feet, hands -unusually weak or tired Side effects that usually do not require medical attention (report to your doctor or health care professional if they continue or are bothersome): -diarrhea -fever, chills (flu-like symptoms) -headaches -nausea, vomiting -redness, stinging, or swelling at site where injected This list may not describe all possible side effects. Call your doctor for medical advice about side effects. You may report side effects to FDA at 1-800-FDA-1088. Where should I keep my medicine? Keep out of the reach of children. Store in a refrigerator between 2 and 8 degrees C (36 and 46 degrees F). Do not freeze. Do not shake. Throw away any unused portion if using a single-dose vial. Throw away any unused medicine after the expiration date. NOTE: This sheet is a summary. It may not cover all possible information. If you have questions about this medicine, talk to your doctor, pharmacist, or health care provider.  2015, Elsevier/Gold Standard. (2008-03-31 10:23:57)

## 2013-10-16 NOTE — Telephone Encounter (Signed)
Pt moved 6-29 to 7-21. I left RN voice mail

## 2013-10-17 ENCOUNTER — Other Ambulatory Visit: Payer: Self-pay | Admitting: *Deleted

## 2013-10-17 ENCOUNTER — Telehealth: Payer: Self-pay | Admitting: *Deleted

## 2013-10-17 DIAGNOSIS — D509 Iron deficiency anemia, unspecified: Secondary | ICD-10-CM

## 2013-10-17 DIAGNOSIS — I4891 Unspecified atrial fibrillation: Secondary | ICD-10-CM

## 2013-10-17 MED ORDER — LABETALOL HCL 200 MG PO TABS: ORAL_TABLET | ORAL | Status: DC

## 2013-10-17 NOTE — Telephone Encounter (Addendum)
Message copied by Lenn Sink on Fri Oct 17, 2013  9:51 AM ------      Message from: [name]      Created: Thu Oct 16, 2013  4:47 PM       Call - iron is low.  Need feraheme 501mg  x 1 dose.  plases set up in 1 week.  pete ------Informed pt that iron is low and he needs IV iron transfusion in the next week. Sent pt to scheduler to set up an appt.

## 2013-10-20 ENCOUNTER — Ambulatory Visit (HOSPITAL_BASED_OUTPATIENT_CLINIC_OR_DEPARTMENT_OTHER): Payer: BC Managed Care – PPO

## 2013-10-20 VITALS — BP 150/65 | HR 67 | Temp 97.6°F | Resp 12

## 2013-10-20 DIAGNOSIS — D509 Iron deficiency anemia, unspecified: Secondary | ICD-10-CM

## 2013-10-20 MED ORDER — SODIUM CHLORIDE 0.9 % IV SOLN
510.0000 mg | Freq: Once | INTRAVENOUS | Status: AC
  Administered 2013-10-20: 510 mg via INTRAVENOUS
  Filled 2013-10-20: qty 17

## 2013-10-20 MED ORDER — SODIUM CHLORIDE 0.9 % IV SOLN
INTRAVENOUS | Status: DC
  Administered 2013-10-20: 13:00:00 via INTRAVENOUS

## 2013-10-20 NOTE — Patient Instructions (Signed)

## 2013-10-27 ENCOUNTER — Other Ambulatory Visit: Payer: BC Managed Care – PPO | Admitting: Lab

## 2013-10-27 ENCOUNTER — Ambulatory Visit: Payer: BC Managed Care – PPO | Admitting: Hematology & Oncology

## 2013-11-18 ENCOUNTER — Ambulatory Visit (HOSPITAL_BASED_OUTPATIENT_CLINIC_OR_DEPARTMENT_OTHER): Payer: BC Managed Care – PPO | Admitting: Hematology & Oncology

## 2013-11-18 ENCOUNTER — Other Ambulatory Visit (HOSPITAL_BASED_OUTPATIENT_CLINIC_OR_DEPARTMENT_OTHER): Payer: BC Managed Care – PPO | Admitting: Lab

## 2013-11-18 VITALS — BP 128/65 | HR 65 | Temp 96.8°F | Resp 18 | Ht 74.0 in | Wt 310.0 lb

## 2013-11-18 DIAGNOSIS — N183 Chronic kidney disease, stage 3 unspecified: Secondary | ICD-10-CM

## 2013-11-18 DIAGNOSIS — D631 Anemia in chronic kidney disease: Secondary | ICD-10-CM

## 2013-11-18 DIAGNOSIS — N189 Chronic kidney disease, unspecified: Secondary | ICD-10-CM

## 2013-11-18 DIAGNOSIS — D509 Iron deficiency anemia, unspecified: Secondary | ICD-10-CM

## 2013-11-18 DIAGNOSIS — E291 Testicular hypofunction: Secondary | ICD-10-CM

## 2013-11-18 DIAGNOSIS — N039 Chronic nephritic syndrome with unspecified morphologic changes: Secondary | ICD-10-CM

## 2013-11-18 DIAGNOSIS — E349 Endocrine disorder, unspecified: Secondary | ICD-10-CM

## 2013-11-18 DIAGNOSIS — I070 Rheumatic tricuspid stenosis: Secondary | ICD-10-CM

## 2013-11-18 LAB — CBC WITH DIFFERENTIAL (CANCER CENTER ONLY)
BASO#: 0 10*3/uL (ref 0.0–0.2)
BASO%: 0 % (ref 0.0–2.0)
EOS ABS: 0.2 10*3/uL (ref 0.0–0.5)
EOS%: 3.7 % (ref 0.0–7.0)
HEMATOCRIT: 35 % — AB (ref 38.7–49.9)
HEMOGLOBIN: 12.2 g/dL — AB (ref 13.0–17.1)
LYMPH#: 1.1 10*3/uL (ref 0.9–3.3)
LYMPH%: 25 % (ref 14.0–48.0)
MCH: 29.7 pg (ref 28.0–33.4)
MCHC: 34.9 g/dL (ref 32.0–35.9)
MCV: 85 fL (ref 82–98)
MONO#: 0.4 10*3/uL (ref 0.1–0.9)
MONO%: 8.3 % (ref 0.0–13.0)
NEUT#: 2.8 10*3/uL (ref 1.5–6.5)
NEUT%: 63 % (ref 40.0–80.0)
Platelets: 261 10*3/uL (ref 145–400)
RBC: 4.11 10*6/uL — AB (ref 4.20–5.70)
RDW: 12.6 % (ref 11.1–15.7)
WBC: 4.4 10*3/uL (ref 4.0–10.0)

## 2013-11-18 LAB — COMPREHENSIVE METABOLIC PANEL (CC13)
ALBUMIN: 3.6 g/dL (ref 3.5–5.0)
ALT: 34 U/L (ref 0–55)
AST: 25 U/L (ref 5–34)
Alkaline Phosphatase: 51 U/L (ref 40–150)
Anion Gap: 8 mEq/L (ref 3–11)
BUN: 48.8 mg/dL — ABNORMAL HIGH (ref 7.0–26.0)
CALCIUM: 9.3 mg/dL (ref 8.4–10.4)
CO2: 24 mEq/L (ref 22–29)
Chloride: 106 mEq/L (ref 98–109)
Creatinine: 2.3 mg/dL — ABNORMAL HIGH (ref 0.7–1.3)
Glucose: 87 mg/dl (ref 70–140)
POTASSIUM: 4.4 meq/L (ref 3.5–5.1)
Sodium: 138 mEq/L (ref 136–145)
Total Bilirubin: 0.32 mg/dL (ref 0.20–1.20)
Total Protein: 6.9 g/dL (ref 6.4–8.3)

## 2013-11-18 LAB — IRON AND TIBC CHCC
%SAT: 45 % (ref 20–55)
Iron: 105 ug/dL (ref 42–163)
TIBC: 234 ug/dL (ref 202–409)
UIBC: 129 ug/dL (ref 117–376)

## 2013-11-18 LAB — PROTIME-INR (CHCC SATELLITE)
INR: 2.7 (ref 2.0–3.5)
Protime: 32.4 Seconds — ABNORMAL HIGH (ref 10.6–13.4)

## 2013-11-18 LAB — FERRITIN CHCC: Ferritin: 1984 ng/ml — ABNORMAL HIGH (ref 22–316)

## 2013-11-19 ENCOUNTER — Telehealth: Payer: Self-pay | Admitting: *Deleted

## 2013-11-19 LAB — RETICULOCYTES (CHCC)
ABS Retic: 25 10*3/uL (ref 19.0–186.0)
RBC.: 4.16 MIL/uL — AB (ref 4.22–5.81)
Retic Ct Pct: 0.6 % (ref 0.4–2.3)

## 2013-11-19 LAB — TESTOSTERONE: Testosterone: 193 ng/dL — ABNORMAL LOW (ref 300–890)

## 2013-11-19 NOTE — Progress Notes (Signed)
Hematology and Oncology Follow Up Visit  MAHAMADOU HEYDER [id number] 1955/06/07 58 y.o. 11/19/2013   Principle Diagnosis:  1. anemia of renal insufficiency 2. Intermittent iron deficiency anemia 3. 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 Aranesp 300 mcg subcu as needed for hemoglobin less than 11. 2. IV iron as indicated. 3. Coumadin - lifelong.     Interim History:  Mr.  Vlahos is back for followup. He feels that he has a low bit of a cold. He's a little congested in the sinuses. He just got back from a trip to [place].  He has got iron and Aranesp but in June. He got Aranesp at 300 mcg. He got Feraheme at a dose of 510 mg.  He has had no obvious bleeding.  There's been no shortness of breath. He's had no nausea vomiting. His leg swelling has gotten a little bit better.  Has not noted any melena or bright red blood per rectum.  He's had no issues with respect to atrial ablation or flutter.    Medications: Current outpatient prescriptions:acetaminophen (TYLENOL) 500 MG tablet, Take 500 mg by mouth as needed. For pain, Disp: , Rfl: ;  amLODipine (NORVASC) 10 MG tablet, Take 1 tablet (10 mg total) by mouth daily., Disp: 90 tablet, Rfl: 3;  amoxicillin (AMOXIL) 500 MG capsule, Before dental appts., Disp: , Rfl: ;  cloNIDine (CATAPRES) 0.1 MG tablet, Take 1 tablet (0.1 mg total) by mouth 3 (three) times daily., Disp: 270 tablet, Rfl: 3 COLCRYS 0.6 MG tablet, Take 0.6 mg by mouth as needed. , Disp: , Rfl: ;  cyclobenzaprine (FLEXERIL) 10 MG tablet, Take 5 mg by mouth at bedtime as needed. , Disp: , Rfl: ;  furosemide (LASIX) 20 MG tablet, Take 20 mg by mouth daily. May take 40 mg if he feels he needs it, Disp: , Rfl: ;  HYDROcodone-acetaminophen (VICODIN) 5-500 MG per tablet, Take 1 tablet by mouth as needed for pain., Disp: , Rfl:  labetalol (NORMODYNE) 200 MG tablet, 2 tabs am 1 tab pm, Disp: 270 tablet, Rfl: 1;  metoprolol (LOPRESSOR)  100 MG tablet, TAKE 1 TABLET BY MOUTH ONCE DAILY., Disp: 90 tablet, Rfl: 3;  olmesartan (BENICAR) 40 MG tablet, Take 1 tablet (40 mg total) by mouth daily., Disp: 90 tablet, Rfl: 3;  pantoprazole (PROTONIX) 40 MG tablet, Take 40 mg by mouth daily. , Disp: , Rfl:  predniSONE (DELTASONE) 5 MG tablet, Take 5 mg by mouth. "As needed for gout", Disp: , Rfl: ;  sildenafil (VIAGRA) 50 MG tablet, Take 50 mg by mouth daily as needed. , Disp: , Rfl: ;  Testosterone (FORTESTA) 10 MG/ACT (2%) GEL, Place 40 mg onto the skin daily. (2 pumps to thigh), Disp: 60 g, Rfl: 3;  ULORIC 80 MG TABS, Take 80 mg by mouth daily., Disp: , Rfl: ;  VOLTAREN 1 % GEL, Apply 2 g topically as needed. , Disp: , Rfl:  warfarin (COUMADIN) 7.5 MG tablet, Take 1 tablet (7.5 mg total) by mouth every morning. TAKES 7.5 MG Q. DAY  EXCEPT Wednesday  HE TAKES 11.25 MG., Disp: 90 tablet, Rfl: 3;  b complex vitamins tablet, Take 1 tablet by mouth daily. , Disp: , Rfl:   Allergies:  Allergies  Allergen Reactions  . Ace Inhibitors     Diffuse swelling No issues with Benicar    Past Medical History, Surgical history, Social history, and Family History were reviewed and updated.  Review of Systems: As above  Physical Exam:  height is 6\' 2"  (1.88 m) and weight is 310 lb (140.615 kg). His oral temperature is 96.8 F (36 C). His blood pressure is 128/65 and his pulse is 65. His respiration is 18.   Well-developed and well-nourished white cell and. He is somewhat overweight. Head and neck exam shows no ocular or oral lesions. There is no palpable cervical or supraclavicular lymph nodes. Lungs are clear. Cardiac exam shows a click from his mechanical tricuspid valve. This is a systolic click. He has no murmurs. Abdomen is soft. His obese. Has good bowel sounds. There is no palpable liver or spleen tip. Back exam shows no tenderness over the spine ribs or hips. Extremities shows no clubbing cyanosis or edema. Skin exam no rashes ecchymosis or  petechia. Neurological exam is nonfocal.  Lab Results  Component Value Date   WBC 4.4 11/18/2013   HGB 12.2* 11/18/2013   HCT 35.0* 11/18/2013   MCV 85 11/18/2013   PLT 261 11/18/2013     Chemistry      Component Value Date/Time   NA 138 11/18/2013 0920   NA 141 10/16/2013 1033   NA 140 08/28/2013 0837   K 4.4 11/18/2013 0920   K 4.4 10/16/2013 1033   K 4.3 08/28/2013 0837   CL 102 10/16/2013 1033   CL 105 08/28/2013 0837   CO2 24 11/18/2013 0920   CO2 26 10/16/2013 1033   CO2 22 08/28/2013 0837   BUN 48.8* 11/18/2013 0920   BUN 55* 10/16/2013 1033   BUN 46* 08/28/2013 0837   CREATININE 2.3* 11/18/2013 0920   CREATININE 2.4* 10/16/2013 1033   CREATININE 2.42* 08/28/2013 0837      Component Value Date/Time   CALCIUM 9.3 11/18/2013 0920   CALCIUM 9.1 10/16/2013 1033   CALCIUM 9.1 08/28/2013 0837   ALKPHOS 51 11/18/2013 0920   ALKPHOS 39 10/16/2013 1033   ALKPHOS 44 08/28/2013 0837   AST 25 11/18/2013 0920   AST 22 10/16/2013 1033   AST 15 08/28/2013 0837   ALT 34 11/18/2013 0920   ALT 21 10/16/2013 1033   ALT 16 08/28/2013 0837   BILITOT 0.32 11/18/2013 0920   BILITOT 0.80 10/16/2013 1033   BILITOT 0.5 08/28/2013 0837      Ferritin is 2000. Iron saturation is 45%. Total iron is 105.  Testosterone is 193.  INR is 2.4 Impression and Plan: Mr. Dichter is 58 year old gentleman with multiple anemia etiologies. His blood counts he is doing quite well right now. Again, he or response to Aranesp and iron.  He he is not iron deficient. His testosterone level is on the low side. This might be causing to have some fatigue.  I will continue to follow his blood work monthly.   I will see him back myself in 3 months. He cyanosis HEENT come in to see [place] at any time for blood work.  I spent a good half hour with him today. I went over his labs. I reviewed his blood smear.   Volanda Napoleon, MD 7/22/20156:48 AM

## 2013-11-19 NOTE — Telephone Encounter (Addendum)
Message copied by Lenn Sink on Wed Nov 19, 2013  3:56 PM ------      Message from: [name]      Created: Wed Nov 19, 2013  7:05 AM       Call -iron is ok but testosterone is low!!  Is he still taking the Benin??  pete ------Informed pt that his iron is okay. Informed pt his testosterone is low. Pt states he is taking the Benin as directed. I informed pt I will let the Dr. know this info and I will get back to him

## 2013-12-11 ENCOUNTER — Telehealth: Payer: Self-pay | Admitting: Cardiology

## 2013-12-11 NOTE — Telephone Encounter (Signed)
Check ECG and Inr; if patient in afib and INR therapeutic, schedule DCCV. Kirk Ruths

## 2013-12-11 NOTE — Telephone Encounter (Signed)
Spoke with pt, Aware of dr Jacalyn Lefevre recommendations.  He will come to the office tomorrow foe EKG and INR.

## 2013-12-11 NOTE — Telephone Encounter (Signed)
New Prob    Pt states he is currently in A-Fib onset 8/10 and requesting to speak to a nurse regarding scheduling a cardioversion ASAP. Pt will be in a conference and requesting a call after 1130 AM today.

## 2013-12-11 NOTE — Telephone Encounter (Signed)
Spoke with pt, he has been in atrial fib since August 04, 2022, it is more pronounced than ever per his report. He is c/o headache and he feels jumpy. Dr Marin Olp follows his INR, the last was drawn on 11-18-13 and was 2.7. He would like a dccv asap if dr Stanford Breed thinks that is appropriate. Will forward for dr Stanford Breed review

## 2013-12-12 ENCOUNTER — Ambulatory Visit (INDEPENDENT_AMBULATORY_CARE_PROVIDER_SITE_OTHER): Payer: BC Managed Care – PPO | Admitting: Pharmacist Clinician (PhC)/ Clinical Pharmacy Specialist

## 2013-12-12 ENCOUNTER — Encounter: Payer: Self-pay | Admitting: *Deleted

## 2013-12-12 ENCOUNTER — Ambulatory Visit (INDEPENDENT_AMBULATORY_CARE_PROVIDER_SITE_OTHER): Payer: BC Managed Care – PPO | Admitting: *Deleted

## 2013-12-12 VITALS — BP 138/60 | HR 56 | Ht 74.0 in | Wt 315.2 lb

## 2013-12-12 DIAGNOSIS — I4891 Unspecified atrial fibrillation: Secondary | ICD-10-CM

## 2013-12-12 DIAGNOSIS — Z7901 Long term (current) use of anticoagulants: Secondary | ICD-10-CM

## 2013-12-12 HISTORY — DX: Long term (current) use of anticoagulants: Z79.01

## 2013-12-12 LAB — POCT INR: INR: 2.8

## 2013-12-12 NOTE — Progress Notes (Signed)
Dr Stanford Breed reviewed EKG, pt will have DCCV Monday 12-15-13 with dr turner

## 2013-12-12 NOTE — Progress Notes (Signed)
Patient for EKG AND INR INR 2.8 EKG DONE  Notified Fredia Beets RN of results

## 2013-12-15 ENCOUNTER — Encounter (HOSPITAL_COMMUNITY): Payer: Self-pay

## 2013-12-15 ENCOUNTER — Ambulatory Visit (HOSPITAL_COMMUNITY)
Admission: RE | Admit: 2013-12-15 | Discharge: 2013-12-15 | Disposition: A | Discharge status: Home / Self Care | Payer: BC Managed Care – PPO | Source: Ambulatory Visit | Attending: Cardiology | Admitting: Cardiology

## 2013-12-15 ENCOUNTER — Ambulatory Visit (HOSPITAL_COMMUNITY): Payer: BC Managed Care – PPO | Admitting: Anesthesiology

## 2013-12-15 ENCOUNTER — Encounter (HOSPITAL_COMMUNITY)
Admission: RE | Disposition: A | Discharge status: Home / Self Care | Payer: Self-pay | Source: Ambulatory Visit | Attending: Cardiology

## 2013-12-15 ENCOUNTER — Encounter (HOSPITAL_COMMUNITY): Payer: BC Managed Care – PPO | Admitting: Anesthesiology

## 2013-12-15 DIAGNOSIS — I2510 Atherosclerotic heart disease of native coronary artery without angina pectoris: Secondary | ICD-10-CM | POA: Insufficient documentation

## 2013-12-15 DIAGNOSIS — E669 Obesity, unspecified: Secondary | ICD-10-CM | POA: Diagnosis not present

## 2013-12-15 DIAGNOSIS — I712 Thoracic aortic aneurysm, without rupture, unspecified: Secondary | ICD-10-CM | POA: Diagnosis not present

## 2013-12-15 DIAGNOSIS — I4891 Unspecified atrial fibrillation: Secondary | ICD-10-CM

## 2013-12-15 DIAGNOSIS — D631 Anemia in chronic kidney disease: Secondary | ICD-10-CM | POA: Insufficient documentation

## 2013-12-15 DIAGNOSIS — Z87891 Personal history of nicotine dependence: Secondary | ICD-10-CM | POA: Diagnosis not present

## 2013-12-15 DIAGNOSIS — N189 Chronic kidney disease, unspecified: Secondary | ICD-10-CM | POA: Insufficient documentation

## 2013-12-15 DIAGNOSIS — I129 Hypertensive chronic kidney disease with stage 1 through stage 4 chronic kidney disease, or unspecified chronic kidney disease: Secondary | ICD-10-CM | POA: Diagnosis not present

## 2013-12-15 DIAGNOSIS — Z954 Presence of other heart-valve replacement: Secondary | ICD-10-CM | POA: Diagnosis not present

## 2013-12-15 DIAGNOSIS — N039 Chronic nephritic syndrome with unspecified morphologic changes: Secondary | ICD-10-CM

## 2013-12-15 DIAGNOSIS — I739 Peripheral vascular disease, unspecified: Secondary | ICD-10-CM | POA: Insufficient documentation

## 2013-12-15 DIAGNOSIS — I4892 Unspecified atrial flutter: Secondary | ICD-10-CM | POA: Diagnosis not present

## 2013-12-15 HISTORY — PX: CARDIOVERSION: SHX1299

## 2013-12-15 LAB — POCT I-STAT, CHEM 8
BUN: 82 mg/dL — AB (ref 6–23)
CHLORIDE: 106 meq/L (ref 96–112)
CREATININE: 4.1 mg/dL — AB (ref 0.50–1.35)
Calcium, Ion: 1.15 mmol/L (ref 1.12–1.23)
GLUCOSE: 103 mg/dL — AB (ref 70–99)
HCT: 30 % — ABNORMAL LOW (ref 39.0–52.0)
Hemoglobin: 10.2 g/dL — ABNORMAL LOW (ref 13.0–17.0)
Potassium: 4.9 mEq/L (ref 3.7–5.3)
Sodium: 137 mEq/L (ref 137–147)
TCO2: 22 mmol/L (ref 0–100)

## 2013-12-15 SURGERY — CARDIOVERSION
Anesthesia: Monitor Anesthesia Care

## 2013-12-15 MED ORDER — PROPOFOL 10 MG/ML IV BOLUS
INTRAVENOUS | Status: DC | PRN
  Administered 2013-12-15 (×3): 50 mg via INTRAVENOUS

## 2013-12-15 MED ORDER — LIDOCAINE HCL (CARDIAC) 20 MG/ML IV SOLN
INTRAVENOUS | Status: DC | PRN
  Administered 2013-12-15: 80 mg via INTRAVENOUS

## 2013-12-15 MED ORDER — SODIUM CHLORIDE 0.9 % IV SOLN
INTRAVENOUS | Status: DC
Start: 2013-12-15 — End: 2013-12-15
  Administered 2013-12-15: 13:00:00 via INTRAVENOUS

## 2013-12-15 NOTE — Anesthesia Preprocedure Evaluation (Signed)
Anesthesia Evaluation    History of Anesthesia Complications (+) PONV  Airway Mallampati: III TM Distance: >3 FB Neck ROM: Full    Dental  (+) Teeth Intact, Dental Advisory Given   Pulmonary former smoker,          Cardiovascular hypertension, + CAD and + Peripheral Vascular Disease + dysrhythmias     Neuro/Psych    GI/Hepatic PUD,   Endo/Other    Renal/GU Renal InsufficiencyRenal disease     Musculoskeletal   Abdominal   Peds  Hematology  (+) anemia ,   Anesthesia Other Findings   Reproductive/Obstetrics                           Anesthesia Physical Anesthesia Plan  ASA: III  Anesthesia Plan: MAC   Post-op Pain Management:    Induction: Intravenous  Airway Management Planned: Mask  Additional Equipment:   Intra-op Plan:   Post-operative Plan:   Informed Consent: I have reviewed the patients History and Physical, chart, labs and discussed the procedure including the risks, benefits and alternatives for the proposed anesthesia with the patient or authorized representative who has indicated his/her understanding and acceptance.   Dental advisory given  Plan Discussed with: CRNA, Anesthesiologist and Surgeon  Anesthesia Plan Comments:         Anesthesia Quick Evaluation

## 2013-12-15 NOTE — Discharge Instructions (Signed)

## 2013-12-15 NOTE — Transfer of Care (Signed)
Immediate Anesthesia Transfer of Care Note  Patient: Calvin [name]  Procedure(s) Performed: Procedure(s): CARDIOVERSION (N/A)  Patient Location: Endoscopy Unit  Anesthesia Type:MAC  Level of Consciousness: awake, alert  and oriented  Airway & Oxygen Therapy: Patient Spontanous Breathing and Patient connected to nasal cannula oxygen  Post-op Assessment: Report given to PACU RN and Post -op Vital signs reviewed and stable  Post vital signs: Reviewed and stable  Complications: No apparent anesthesia complications

## 2013-12-15 NOTE — CV Procedure (Signed)
Electrical Cardioversion Procedure Note Calvin [name] [id number] 08/30/1955  Procedure: Electrical Cardioversion Indications:  Atrial Fibrillation  Time Out: Verified patient identification, verified procedure,medications/allergies/relevent history reviewed, required imaging and test results available.  Performed  Procedure Details  The patient was NPO after midnight. Anesthesia was administered at the beside  by Dr.Moser with 80mg  of Lidocaine and 150mg  of propofol.  Cardioversion was done with synchronized biphasic defibrillation with AP pads with 150watts.  The patient converted to normal sinus rhythm. The patient tolerated the procedure well   IMPRESSION:  Successful cardioversion of atrial fibrillation    Calvin [name] 12/15/2013, 1:34 PM

## 2013-12-15 NOTE — Anesthesia Postprocedure Evaluation (Signed)
  Anesthesia Post-op Note  Patient: Calvin [name]  Procedure(s) Performed: Procedure(s): CARDIOVERSION (N/A)  Patient Location: Endoscopy Unit  Anesthesia Type:MAC  Level of Consciousness: awake and alert   Airway and Oxygen Therapy: Patient Spontanous Breathing  Post-op Pain: none  Post-op Assessment: Post-op Vital signs reviewed, Patient's Cardiovascular Status Stable, Respiratory Function Stable, Patent Airway, No signs of Nausea or vomiting and Pain level controlled  Post-op Vital Signs: Reviewed and stable  Last Vitals:  Filed Vitals:   12/15/13 1443  BP: 158/57  Pulse: 63  Temp:   Resp: 14    Complications: No apparent anesthesia complications

## 2013-12-16 ENCOUNTER — Encounter (HOSPITAL_COMMUNITY): Payer: Self-pay | Admitting: Cardiology

## 2013-12-16 NOTE — H&P (Signed)
Admit date: 12/15/2013 Referring Physician Dr. Stanford Breed Primary Cardiologist Dr. Stanford Breed Chief complaint/reason for admission:  Atrial fibrillation  HPI: Pleasant male with past medical history of paroxysmal atrial fibrillation/flutter, history of aortic valve replacement, aortic coarctation, aortic aneurysm, coronary artery disease on CT scan and renal insufficiency for fu. Patient is s/p coarctation repair in 1958 and redo in 1971. Had endovascular stent in 1995 and 1996. Had aortic balloon valvuloplasty in 1974 and AVR in 1994. 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 Stent in distal arch, proximal descending aorta patent. Mildly enlarged lymph nodes. He is seen at the [facility] clinic on a yearly basis for followup of above. Patient also has atrial flutter. We cardioverted him previously and his dyspnea on exertion improved. Echocardiogram in December of 2014 showed normal LV function, mechanical aortic valve, moderate left atrial enlargement and stable gradient across presumed coarctation repair. Patient last seen in [place] in July. Echocardiogram showed thoracic aortic aneurysm with stable in size. CTA was recommended if increased in size by echo. Patient presented for an electrocardiogram on Friday 8/14 due to palpitations and fatigue and was found to be in recurrent atrial flibrillation. He was set up by Dr. Stanford Breed to come in today as outpt for DCCV.  He has had a therapeutic INR for months.  He denies any chest pain, SOB, DOE, LE edema.  He has been fatigued.    PMH:    Past Medical History  Diagnosis Date  . 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 HTN (hypertension)   . CAD (coronary artery disease)   . Aortic aneurysm   . Gout   . Atrial fibrillation   . Anemia   . Hyperlipidemia   . Atrial flutter   . Obesity   . Anemia of renal disease 10/04/2011  . CKD (chronic kidney disease)     baseline creatinine is 1.5-2  . Gastric polyp  2007, 06/2012    benign.  . Colonic ulcer 2007    felt to be secondary to NSAID  . Testicular adenoma   . PONV (postoperative nausea and vomiting)     PSH:    Past Surgical History  Procedure Laterality Date  . Coarctation of aorta repair  1958  . Coarctation of aorta repair  1970  . Aortic valve replacement and mitral valve repair  1974    Bicuspid aortic valve  . Orchiectomy  1992    testicular cancer  . Aortic valve replacement  1995    [facility]  . Tonsillectomy    . Cardioversion  02/27/2012    Procedure: CARDIOVERSION;  Surgeon: Lelon Perla, MD;  Location: [facility] ENDOSCOPY;  Service: Cardiovascular;  Laterality: N/A;  . Esophagogastroduodenoscopy N/A 07/04/2012    Procedure: ESOPHAGOGASTRODUODENOSCOPY (EGD);  Surgeon: Inda Castle, MD;  Location: [place];  Service: Endoscopy;  Laterality: N/A;  . Cardioversion N/A 05/29/2013    Procedure: CARDIOVERSION;  Surgeon: Lelon Perla, MD;  Location: [facility] ENDOSCOPY;  Service: Cardiovascular;  Laterality: N/A;    ALLERGIES:   Ace inhibitors  Prior to Admit Meds:   No prescriptions prior to admission   Family HX:    Family History  Problem Relation Age of Onset  . Heart disease      No family history   Social HX:    History   Social History  . Marital Status: Married    Spouse Name: N/A    Number of Children: 4  .  Years of Education: N/A   Occupational History  . Not on file.   Social History Main Topics  . Smoking status: Former Smoker -- 0.25 packs/day for 7 years    Types: Cigarettes    Start date: 05/28/1975    Quit date: 05/27/1981  . Smokeless tobacco: Never Used     Comment: quit 25 years ago  . Alcohol Use: Yes     Comment: wine, occasionally  . Drug Use: No  . Sexual Activity: Not on file   Other Topics Concern  . Not on file   Social History Narrative   Pt lives in [place] with wife.  3 children ages 25,20,22 (all 3 are adopted)   Sales grinding wheels.     ROS:  All 11 ROS were  addressed and are negative except what is stated in the HPI  PHYSICAL EXAM Filed Vitals:   12/15/13 1443  BP: 158/57  Pulse: 63  Temp:   Resp: 14   General: Well developed, well nourished, in no acute distress Head: Eyes PERRLA, No xanthomas.   Normal cephalic and atramatic  Lungs:   Clear bilaterally to auscultation and percussion. Heart:  irreguarly irregular and tachy S1 S2 Pulses are 2+ & equal.            No carotid bruit. No JVD.  No abdominal bruits. No femoral bruits. Abdomen: Bowel sounds are positive, abdomen soft and non-tender without masses  Extremities:   No clubbing, cyanosis or edema.  DP +1 Neuro: Alert and oriented X 3. Psych:  Good affect, responds appropriately   Labs:   Lab Results  Component Value Date   WBC 4.4 11/18/2013   HGB 10.2* 12/15/2013   HCT 30.0* 12/15/2013   MCV 85 11/18/2013   PLT 261 11/18/2013    Recent Labs Lab 12/15/13 1315  NA 137  K 4.9  CL 106  BUN 82*  CREATININE 4.10*  GLUCOSE 103*   No results found for this basename: CKTOTAL, CKMB, CKMBINDEX, TROPONINI   No results found for this basename: PTT   Lab Results  Component Value Date   INR 2.8 12/12/2013   INR 2.7 11/18/2013   INR 2.4 09/26/2013   PROTIME 32.4* 11/18/2013   PROTIME 28.8* 09/26/2013   PROTIME 24.0* 08/28/2013     No results found for this basename: CHOL   No results found for this basename: HDL   No results found for this basename: LDLCALC   No results found for this basename: TRIG   No results found for this basename: CHOLHDL   No results found for this basename: LDLDIRECT      Radiology:  No results found.  EKG:  Atrial fibrillation with slow VR and nonspecific ST abnormality  ASSESSMENT:  1.  Atrial fibrillation - recurrent and symptomatic with fatigue and palpitations 2.  Chronic systemic anticoagulation with therapeutic INR since March 2015 3.  Acute on chronic kidney disease - followed by renal - creatinine increased to 4.1 (2.3 on  11/18/2013) 4.  HTN controlled  PLAN:   1.  DCCV today 2.  I will give him a copy of his labs and he has agreed to contact his renal MD today when he gets home to let him know what his labs look like  Sueanne Margarita, MD  12/16/2013  8:17 AM

## 2013-12-19 ENCOUNTER — Telehealth: Payer: Self-pay | Admitting: Hematology & Oncology

## 2013-12-19 NOTE — Telephone Encounter (Signed)
Patient's wife called and cx 12/22/13 lab apt due to patient being in the hospital

## 2013-12-22 ENCOUNTER — Other Ambulatory Visit: Payer: BC Managed Care – PPO | Admitting: Lab

## 2013-12-24 ENCOUNTER — Ambulatory Visit (HOSPITAL_BASED_OUTPATIENT_CLINIC_OR_DEPARTMENT_OTHER): Payer: BC Managed Care – PPO

## 2013-12-24 ENCOUNTER — Ambulatory Visit (HOSPITAL_BASED_OUTPATIENT_CLINIC_OR_DEPARTMENT_OTHER): Payer: BC Managed Care – PPO | Admitting: Lab

## 2013-12-24 VITALS — BP 156/61 | HR 62 | Temp 97.7°F | Resp 20 | Wt 322.5 lb

## 2013-12-24 DIAGNOSIS — D509 Iron deficiency anemia, unspecified: Secondary | ICD-10-CM

## 2013-12-24 DIAGNOSIS — N289 Disorder of kidney and ureter, unspecified: Secondary | ICD-10-CM

## 2013-12-24 DIAGNOSIS — I070 Rheumatic tricuspid stenosis: Secondary | ICD-10-CM

## 2013-12-24 DIAGNOSIS — C61 Malignant neoplasm of prostate: Secondary | ICD-10-CM

## 2013-12-24 DIAGNOSIS — D649 Anemia, unspecified: Secondary | ICD-10-CM

## 2013-12-24 DIAGNOSIS — N189 Chronic kidney disease, unspecified: Secondary | ICD-10-CM

## 2013-12-24 DIAGNOSIS — D631 Anemia in chronic kidney disease: Secondary | ICD-10-CM

## 2013-12-24 DIAGNOSIS — D630 Anemia in neoplastic disease: Secondary | ICD-10-CM

## 2013-12-24 DIAGNOSIS — Z7901 Long term (current) use of anticoagulants: Secondary | ICD-10-CM

## 2013-12-24 DIAGNOSIS — N183 Chronic kidney disease, stage 3 unspecified: Secondary | ICD-10-CM

## 2013-12-24 DIAGNOSIS — E349 Endocrine disorder, unspecified: Secondary | ICD-10-CM

## 2013-12-24 LAB — CBC WITH DIFFERENTIAL (CANCER CENTER ONLY)
BASO#: 0 10*3/uL (ref 0.0–0.2)
BASO%: 0.4 % (ref 0.0–2.0)
EOS%: 4 % (ref 0.0–7.0)
Eosinophils Absolute: 0.2 10*3/uL (ref 0.0–0.5)
HEMATOCRIT: 28.2 % — AB (ref 38.7–49.9)
HGB: 9.6 g/dL — ABNORMAL LOW (ref 13.0–17.1)
LYMPH#: 1 10*3/uL (ref 0.9–3.3)
LYMPH%: 18.7 % (ref 14.0–48.0)
MCH: 29.4 pg (ref 28.0–33.4)
MCHC: 34 g/dL (ref 32.0–35.9)
MCV: 86 fL (ref 82–98)
MONO#: 0.4 10*3/uL (ref 0.1–0.9)
MONO%: 7.2 % (ref 0.0–13.0)
NEUT#: 3.9 10*3/uL (ref 1.5–6.5)
NEUT%: 69.7 % (ref 40.0–80.0)
PLATELETS: 294 10*3/uL (ref 145–400)
RBC: 3.27 10*6/uL — ABNORMAL LOW (ref 4.20–5.70)
RDW: 13.6 % (ref 11.1–15.7)
WBC: 5.6 10*3/uL (ref 4.0–10.0)

## 2013-12-24 LAB — TESTOSTERONE: Testosterone: 185 ng/dL — ABNORMAL LOW (ref 300–890)

## 2013-12-24 LAB — BASIC METABOLIC PANEL
BUN: 44 mg/dL — ABNORMAL HIGH (ref 6–23)
CHLORIDE: 107 meq/L (ref 96–112)
CO2: 22 meq/L (ref 19–32)
CREATININE: 2.05 mg/dL — AB (ref 0.50–1.35)
Calcium: 8.7 mg/dL (ref 8.4–10.5)
GLUCOSE: 94 mg/dL (ref 70–99)
Potassium: 4.4 mEq/L (ref 3.5–5.3)
Sodium: 139 mEq/L (ref 135–145)

## 2013-12-24 LAB — PROTIME-INR (CHCC SATELLITE)
INR: 3.5 (ref 2.0–3.5)
Protime: 42 Seconds — ABNORMAL HIGH (ref 10.6–13.4)

## 2013-12-24 MED ORDER — DARBEPOETIN ALFA-POLYSORBATE 300 MCG/0.6ML IJ SOLN
300.0000 ug | Freq: Once | INTRAMUSCULAR | Status: AC
  Administered 2013-12-24: 300 ug via SUBCUTANEOUS

## 2013-12-24 MED ORDER — DARBEPOETIN ALFA-POLYSORBATE 300 MCG/0.6ML IJ SOLN
INTRAMUSCULAR | Status: AC
  Filled 2013-12-24: qty 0.6

## 2013-12-24 NOTE — Patient Instructions (Signed)
Darbepoetin Alfa injection What is this medicine? DARBEPOETIN ALFA (dar be POE e tin AL fa) helps your body make more red blood cells. It is used to treat anemia caused by chronic kidney failure and chemotherapy. This medicine may be used for other purposes; ask your health care provider or pharmacist if you have questions. COMMON BRAND NAME(S): Aranesp What should I tell my health care provider before I take this medicine? They need to know if you have any of these conditions: -blood clotting disorders or history of blood clots -cancer patient not on chemotherapy -cystic fibrosis -heart disease, such as angina, heart failure, or a history of a heart attack -hemoglobin level of 12 g/dL or greater -high blood pressure -low levels of folate, iron, or vitamin B12 -seizures -an unusual or allergic reaction to darbepoetin, erythropoietin, albumin, hamster proteins, latex, other medicines, foods, dyes, or preservatives -pregnant or trying to get pregnant -breast-feeding How should I use this medicine? This medicine is for injection into a vein or under the skin. It is usually given by a health care professional in a hospital or clinic setting. If you get this medicine at home, you will be taught how to prepare and give this medicine. Do not shake the solution before you withdraw a dose. Use exactly as directed. Take your medicine at regular intervals. Do not take your medicine more often than directed. It is important that you put your used needles and syringes in a special sharps container. Do not put them in a trash can. If you do not have a sharps container, call your pharmacist or healthcare provider to get one. Talk to your pediatrician regarding the use of this medicine in children. While this medicine may be used in children as young as 1 year for selected conditions, precautions do apply. Overdosage: If you think you have taken too much of this medicine contact a poison control center or  emergency room at once. NOTE: This medicine is only for you. Do not share this medicine with others. What if I miss a dose? If you miss a dose, take it as soon as you can. If it is almost time for your next dose, take only that dose. Do not take double or extra doses. What may interact with this medicine? Do not take this medicine with any of the following medications: -epoetin alfa This list may not describe all possible interactions. Give your health care provider a list of all the medicines, herbs, non-prescription drugs, or dietary supplements you use. Also tell them if you smoke, drink alcohol, or use illegal drugs. Some items may interact with your medicine. What should I watch for while using this medicine? Visit your prescriber or health care professional for regular checks on your progress and for the needed blood tests and blood pressure measurements. It is especially important for the doctor to make sure your hemoglobin level is in the desired range, to limit the risk of potential side effects and to give you the best benefit. Keep all appointments for any recommended tests. Check your blood pressure as directed. Ask your doctor what your blood pressure should be and when you should contact him or her. As your body makes more red blood cells, you may need to take iron, folic acid, or vitamin B supplements. Ask your doctor or health care provider which products are right for you. If you have kidney disease continue dietary restrictions, even though this medication can make you feel better. Talk with your doctor or health   care professional about the foods you eat and the vitamins that you take. What side effects may I notice from receiving this medicine? Side effects that you should report to your doctor or health care professional as soon as possible: -allergic reactions like skin rash, itching or hives, swelling of the face, lips, or tongue -breathing problems -changes in vision -chest  pain -confusion, trouble speaking or understanding -feeling faint or lightheaded, falls -high blood pressure -muscle aches or pains -pain, swelling, warmth in the leg -rapid weight gain -severe headaches -sudden numbness or weakness of the face, arm or leg -trouble walking, dizziness, loss of balance or coordination -seizures (convulsions) -swelling of the ankles, feet, hands -unusually weak or tired Side effects that usually do not require medical attention (report to your doctor or health care professional if they continue or are bothersome): -diarrhea -fever, chills (flu-like symptoms) -headaches -nausea, vomiting -redness, stinging, or swelling at site where injected This list may not describe all possible side effects. Call your doctor for medical advice about side effects. You may report side effects to FDA at 1-800-FDA-1088. Where should I keep my medicine? Keep out of the reach of children. Store in a refrigerator between 2 and 8 degrees C (36 and 46 degrees F). Do not freeze. Do not shake. Throw away any unused portion if using a single-dose vial. Throw away any unused medicine after the expiration date. NOTE: This sheet is a summary. It may not cover all possible information. If you have questions about this medicine, talk to your doctor, pharmacist, or health care provider.  2015, Elsevier/Gold Standard. (2008-03-31 10:23:57)  

## 2013-12-29 ENCOUNTER — Encounter: Payer: Self-pay | Admitting: Physician Assistant

## 2013-12-29 ENCOUNTER — Ambulatory Visit (INDEPENDENT_AMBULATORY_CARE_PROVIDER_SITE_OTHER): Payer: BC Managed Care – PPO | Admitting: Physician Assistant

## 2013-12-29 ENCOUNTER — Other Ambulatory Visit: Payer: Self-pay | Admitting: Hematology & Oncology

## 2013-12-29 VITALS — BP 150/70 | HR 65 | Ht 74.0 in | Wt 319.0 lb

## 2013-12-29 DIAGNOSIS — I712 Thoracic aortic aneurysm, without rupture, unspecified: Secondary | ICD-10-CM

## 2013-12-29 DIAGNOSIS — I4891 Unspecified atrial fibrillation: Secondary | ICD-10-CM

## 2013-12-29 DIAGNOSIS — I10 Essential (primary) hypertension: Secondary | ICD-10-CM

## 2013-12-29 DIAGNOSIS — I480 Paroxysmal atrial fibrillation: Secondary | ICD-10-CM

## 2013-12-29 DIAGNOSIS — N183 Chronic kidney disease, stage 3 unspecified: Secondary | ICD-10-CM

## 2013-12-29 DIAGNOSIS — Z954 Presence of other heart-valve replacement: Secondary | ICD-10-CM

## 2013-12-29 DIAGNOSIS — Z952 Presence of prosthetic heart valve: Secondary | ICD-10-CM

## 2013-12-29 DIAGNOSIS — Q251 Coarctation of aorta: Secondary | ICD-10-CM

## 2013-12-29 DIAGNOSIS — E785 Hyperlipidemia, unspecified: Secondary | ICD-10-CM

## 2013-12-29 NOTE — Patient Instructions (Signed)
The name of the medication we discussed today is Amiodarone.  Schedule follow up with Dr. Kirk Ruths in 03/2014.

## 2013-12-29 NOTE — Progress Notes (Signed)
Cardiology Office Note    Date:  12/29/2013   ID:  Drayce, Hollings [birth year], MRN [id number]  PCP:  Lilian Coma., MD  Cardiologist:  Dr. Kirk Ruths   Electrophysiologist:  Dr. Thompson Grayer    History of Present Illness: Calvin [name] is a 58 y.o. male with a hx of paroxysmal atrial fibrillation/flutter, s/p aortic valve replacement, aortic coarctation, aortic aneurysm, coronary artery disease on CT scan and CKD. Patient is s/p coarctation repair in 1958 and redo in 1971. Had endovascular stent in 1995 and 1996. Had aortic balloon valvuloplasty in 1974 and AVR in 1994. 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 Stent in distal arch, proximal descending aorta patent. Mildly enlarged lymph nodes. He is seen at the [facility] clinic on a yearly basis for followup of above.   Echocardiogram in December of 2014 showed normal LV function, mechanical aortic valve, moderate left atrial enlargement and stable gradient across presumed coarctation repair. Patient presented for an electrocardiogram from December 29 and was found to be in recurrent atrial flutter. Underwent repeat cardioversion January 2015. Patient last seen in [facility] in 10/2013.  Plan is to obtain Chest CT without contrast next year to survey his thoracic aorta.    Patient recently presented to the office with palpitations and fatigue.  He was found to be in  Recurrent AFib.  INR had been therapeutic for months.  He was set up for elective DCCV.  This was performed by Dr. Fransico Him on 12/15/13.  He was noted to have an elevated creatinine and was asked to FU with his nephrologist.  He returns for FU.    Patient was admitted to [facility] after his DCCV for several days due to a/c renal failure in the setting of pneumonia.  He was treated with IVFs and antibiotics.  His ARB was held.  His creatinine improved.  I do not have records.  He follows with Dr. Adrian Prows in [facility] (Nephrologist).  He was  cleared to resume his ARB recently and his creatinine has been stable.  He denies any palpitations.  Denies chest pain.  Denies significant dyspnea.  Denies orthopnea, PND.  He has noted increased LE edema.  He has a sliding scale for Lasix when he has increased edema.  He was planning to take extra today/tomorrow.    Studies:  - Echo (12/14):  Mod LVH, EF 55-60%, AVR ok (mean 18 mmHg), mod LAE, stable gradient acrose coarctation repair but restenosis likely - pt followed at [facility] Labs/Images: 05/27/2013: TSH 3.096  11/18/2013: ALT 34  12/24/2013: Creatinine 2.05*; Hemoglobin 9.6*; Potassium 4.4    Wt Readings from Last 3 Encounters:  12/24/13 322 lb 8 oz (146.285 kg)  12/12/13 315 lb 3.2 oz (142.974 kg)  11/18/13 310 lb (140.615 kg)     Past Medical History  Diagnosis Date  . 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 HTN (hypertension)   . CAD (coronary artery disease)   . Aortic aneurysm   . Gout   . Atrial fibrillation   . Anemia   . Hyperlipidemia   . Atrial flutter   . Obesity   . Anemia of renal disease 10/04/2011  . CKD (chronic kidney disease)     baseline creatinine is 1.5-2  . Gastric polyp 2007, 06/2012    benign.  . Colonic ulcer 2007    felt to be secondary to NSAID  . Testicular adenoma   .  PONV (postoperative nausea and vomiting)     Current Outpatient Prescriptions  Medication Sig Dispense Refill  . acetaminophen (TYLENOL) 500 MG tablet Take 500 mg by mouth as needed. For pain      . amLODipine (NORVASC) 10 MG tablet Take 1 tablet (10 mg total) by mouth daily.  90 tablet  3  . amoxicillin (AMOXIL) 500 MG capsule Before dental appts.      Marland Kitchen b complex vitamins tablet Take 1 tablet by mouth daily.       . cloNIDine (CATAPRES) 0.1 MG tablet Take 1 tablet (0.1 mg total) by mouth 3 (three) times daily.  270 tablet  3  . COLCRYS 0.6 MG tablet Take 0.6 mg by mouth as needed.       . cyclobenzaprine (FLEXERIL) 10 MG tablet Take 5 mg by  mouth at bedtime as needed.       . furosemide (LASIX) 20 MG tablet Take 20 mg by mouth daily. May take 40 mg if he feels he needs it      . HYDROcodone-acetaminophen (VICODIN) 5-500 MG per tablet Take 1 tablet by mouth as needed for pain.      Marland Kitchen labetalol (NORMODYNE) 200 MG tablet 2 tabs am 1 tab pm  270 tablet  1  . metoprolol (LOPRESSOR) 100 MG tablet TAKE 1 TABLET BY MOUTH ONCE DAILY.  90 tablet  3  . olmesartan (BENICAR) 40 MG tablet Take 1 tablet (40 mg total) by mouth daily.  90 tablet  3  . pantoprazole (PROTONIX) 40 MG tablet Take 40 mg by mouth daily.       . predniSONE (DELTASONE) 5 MG tablet Take 5 mg by mouth. "As needed for gout"      . sildenafil (VIAGRA) 50 MG tablet Take 50 mg by mouth daily as needed.       . Testosterone (FORTESTA) 10 MG/ACT (2%) GEL Place 40 mg onto the skin daily. (2 pumps to thigh)  60 g  3  . ULORIC 80 MG TABS Take 80 mg by mouth daily.      . VOLTAREN 1 % GEL Apply 2 g topically as needed.       . warfarin (COUMADIN) 7.5 MG tablet Take 1 tablet (7.5 mg total) by mouth every morning. TAKES 7.5 MG Q. DAY  EXCEPT Wednesday  HE TAKES 11.25 MG.  90 tablet  3   No current facility-administered medications for this visit.     Allergies:   Ace inhibitors   Social History:  The patient  reports that he quit smoking about 32 years ago. His smoking use included Cigarettes. He started smoking about 38 years ago. He has a 1.75 pack-year smoking history. He has never used smokeless tobacco. He reports that he drinks alcohol. He reports that he does not use illicit drugs.   Family History:  The patient's family history includes Heart disease in an other family member.   ROS:  Please see the history of present illness.      All other systems reviewed and negative.   PHYSICAL EXAM: VS:  BP 150/70  Pulse 65  Ht 6\' 2"  (1.88 m)  Wt 319 lb (144.697 kg)  BMI 40.94 kg/m2 Well nourished, well developed, in no acute distress HEENT: normal Neck: no JVD Cardiac:   normal S1, S2; RRR; no murmur Lungs:  clear to auscultation bilaterally, no wheezing, rhonchi or rales Abd: soft, nontender, no hepatomegaly Ext: 1+ bilateral LE edema Skin: warm and dry Neuro:  CNs  2-12 intact, no focal abnormalities noted  EKG:  NSR, HR 65, normal axis, IVCD, NSSTTW changes     ASSESSMENT AND PLAN:  Paroxysmal atrial fibrillation:  He remains in NSR.  I will review further with Dr. Stanford Breed regarding whether to refer back to electrophysiology versus trying antiarrhythmic therapy (amiodarone). I reviewed this with the patient today. Continue Coumadin.  Thoracic aortic aneurysm: He is followed closely at the [facility] clinic. Dr. Stanford Breed previously considered proceeding with MRI. Patient tells me that his recent measurements were stable at the [facility] and those records will be forward to [place] soon. The plan is to obtain a non-contrasted chest CT next year to resizes his aneurysm.  Coarctation of aorta (previous repair and stent): As outlined above.  Essential hypertension: His blood pressure is usually better controlled. Continue to monitor.  Hyperlipidemia: Management per primary care.  S/P aortic valve replacement: Continue Coumadin. Continue SBE prophylaxis.  CKD (chronic kidney disease) stage 3, GFR 30-59 ml/min: Continue followup with nephrology.   Disposition:  FU with Dr. Kirk Ruths in 3 mos.    Signed, Versie Starks, MHS 12/29/2013 1:53 PM    [facility] [address], [place], [place]  [address] Phone: [phone]; Fax: [phone]

## 2013-12-31 ENCOUNTER — Other Ambulatory Visit: Payer: Self-pay | Admitting: *Deleted

## 2013-12-31 MED ORDER — METOPROLOL TARTRATE 100 MG PO TABS: ORAL_TABLET | ORAL | Status: DC

## 2014-01-01 ENCOUNTER — Telehealth: Payer: Self-pay | Admitting: Physician Assistant

## 2014-01-01 NOTE — Telephone Encounter (Signed)
Please tell [name] that I reviewed with Dr. Kirk Ruths. He will d/w the patient at his next FU visit in 03/2014 whether to start an anti-arrhythmic drug or refer back to EP for ?ablation. Keep FU as planned. Richardson Dopp, PA-C   01/01/2014 2:01 PM

## 2014-01-02 NOTE — Telephone Encounter (Signed)
lmom per Dr. Stanford Breed he will d/w pt further about anti-arrhythmic drug or refer back to EP for ?ablation. Lmom [phone] if any questions.

## 2014-01-03 IMAGING — CT CT ORBITS W/O CM
3 of 4 series · 15 of 47 positions shown, 18 images · non-contrast
Comparison: None.

CT HEAD

CLINICAL DATA: Left periorbital erythema.

CT HEAD AND ORBITS WITHOUT CONTRAST
TECHNIQUE: Contiguous axial images were obtained from the base of
the skull through the vertex without contrast. Multidetector CT
imaging of the orbits was performed using the standard protocol
without intravenous contrast.

[Series 2: head 4.8 h37s · axial · 0.50mm/px · z∈[+1226,+1366]mm · 9 of 36 slices shown, 12 images]
[im 4/36  brain]
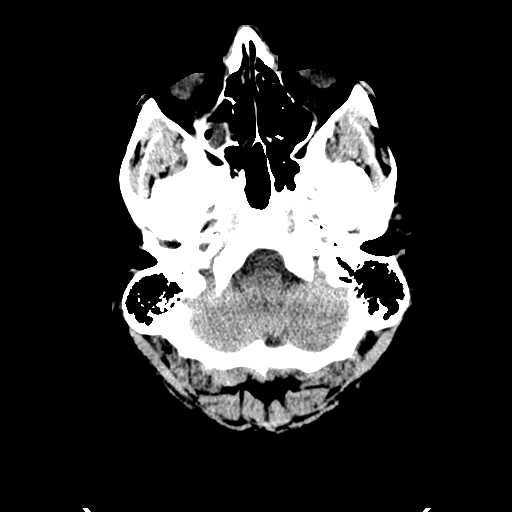
[im 4/36  bone]
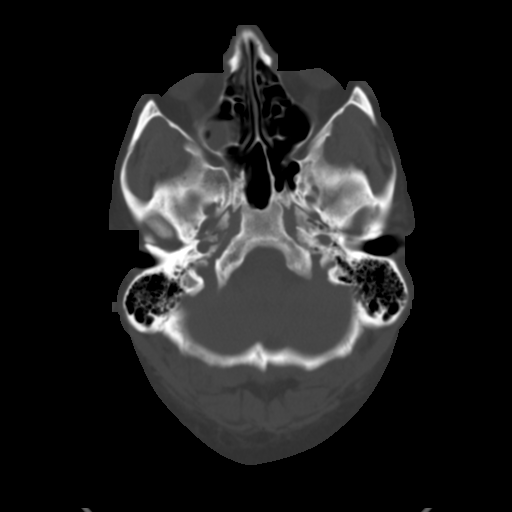
[im 7/36  bone]
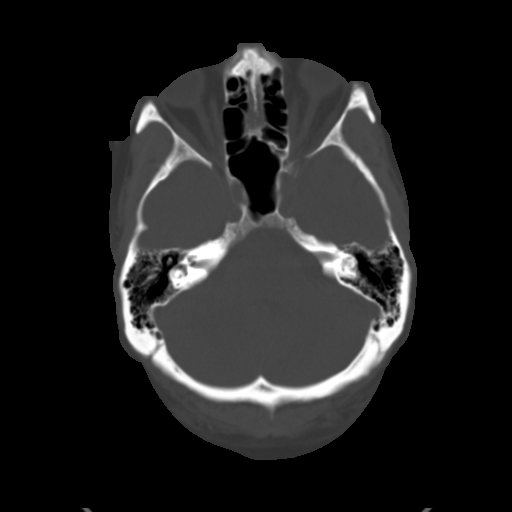
[im 10/36  bone]
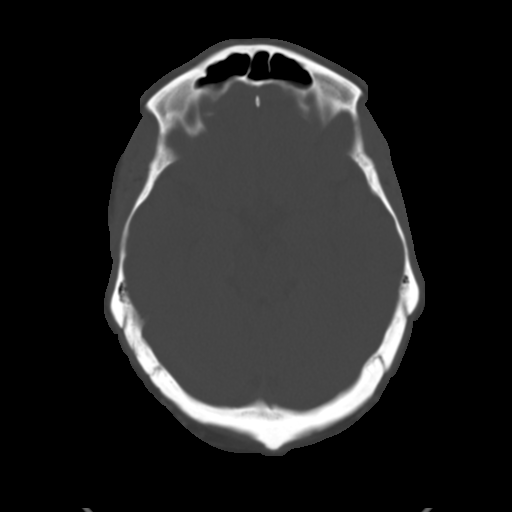
[im 13/36  bone]
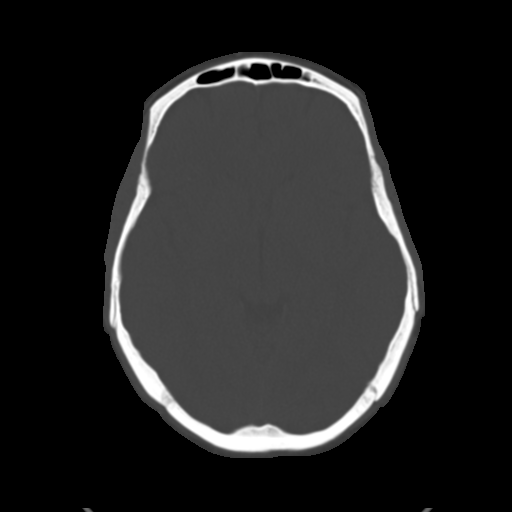
[im 20/36  brain]
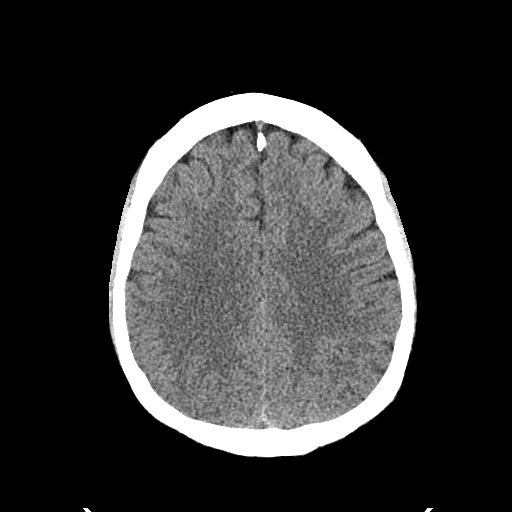
[im 20/36  bone]
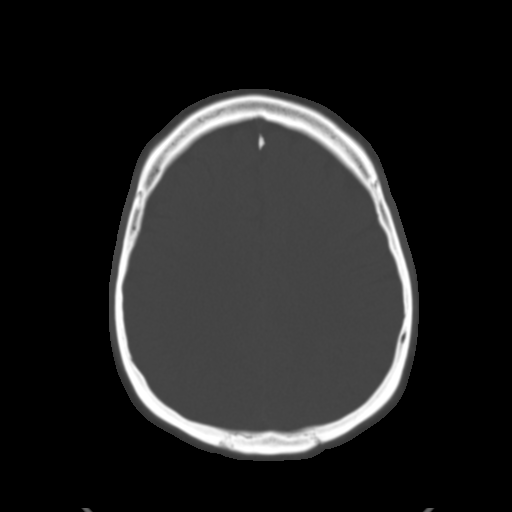
[im 23/36  bone]
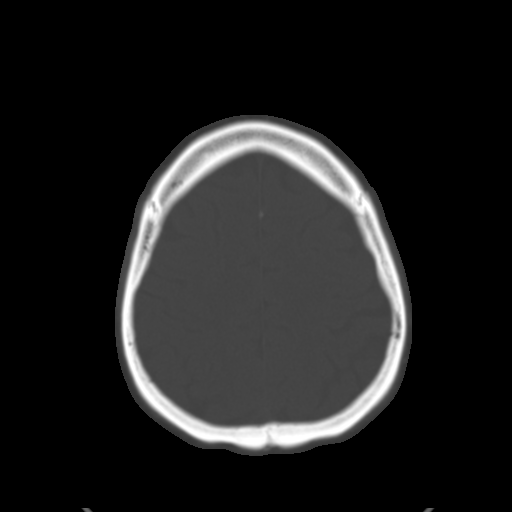
[im 26/36  bone]
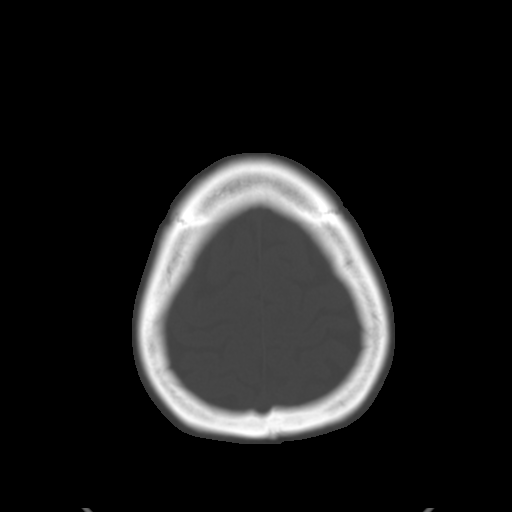
[im 29/36  bone]
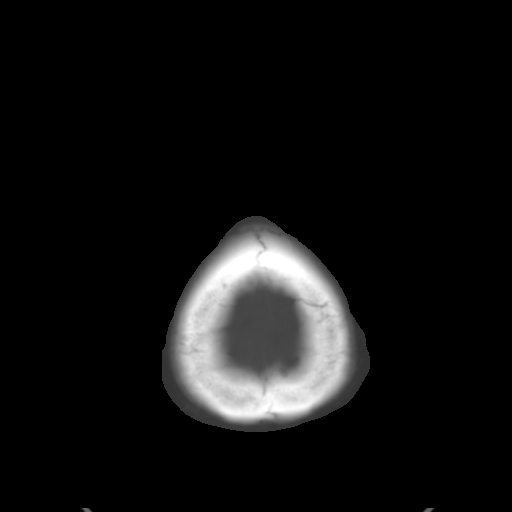
[im 32/36  brain]
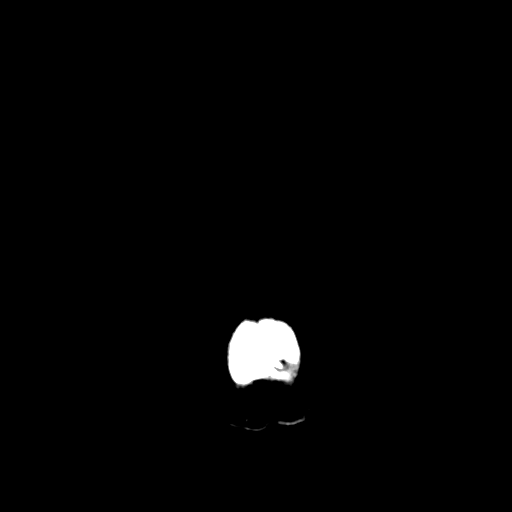
[im 32/36  bone]
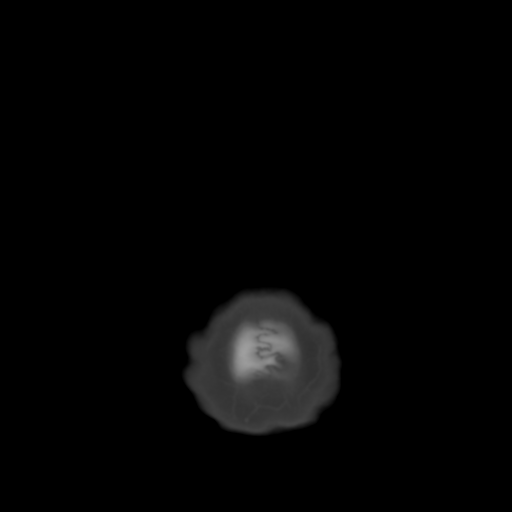

[Series 10: orbits 2.0 coronal · coronal · 0.23mm/px · 3 of 64 slices shown]
[im 22/64  bone]
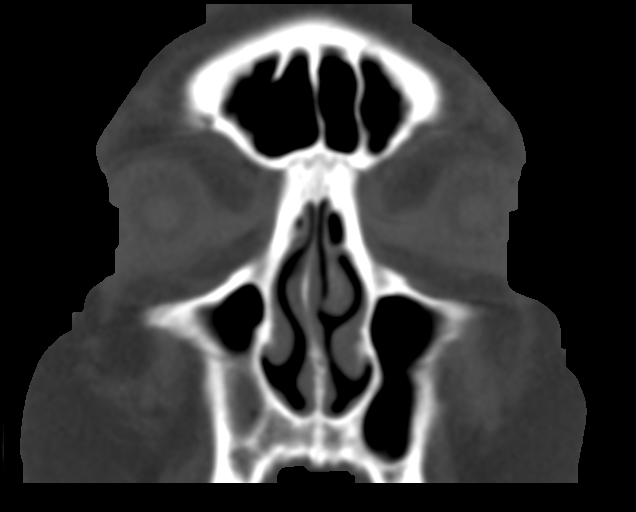
[im 29/64  bone]
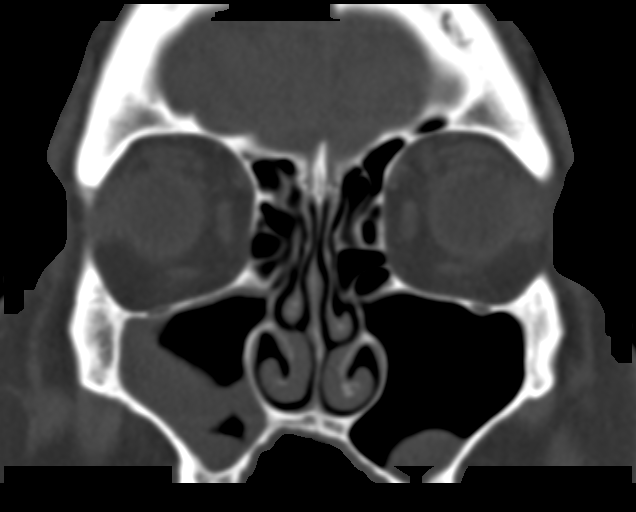
[im 36/64  bone]
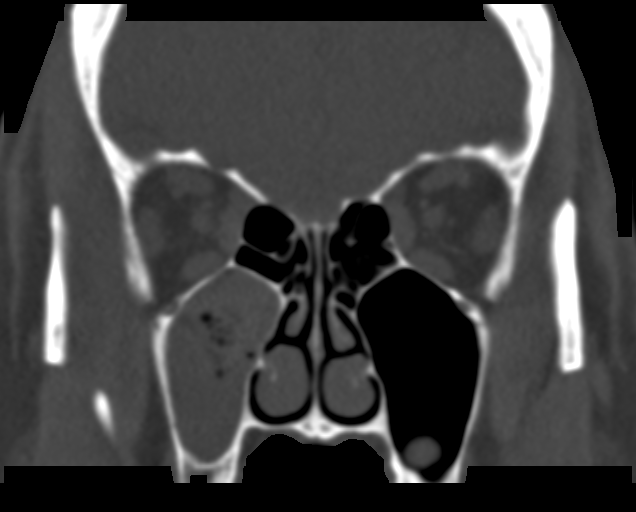

[Series 11: orbits 2.0 sagittal · sagittal · 0.23mm/px · 3 of 73 slices shown]
[im 25/73  bone]
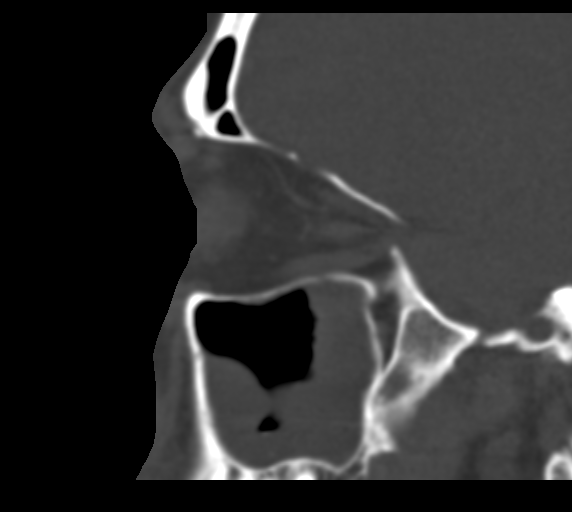
[im 37/73  bone]
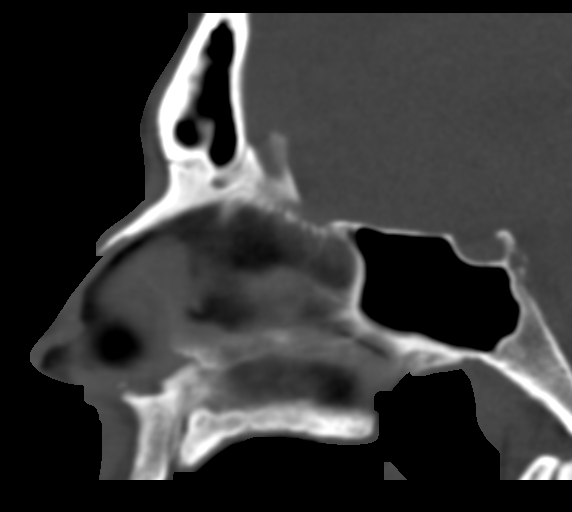
[im 49/73  bone]
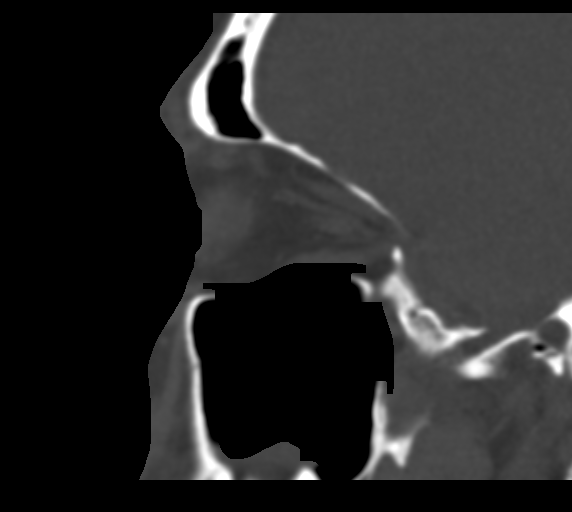

[15 of 47 positions shown; findings below may reference images not displayed]

FINDINGS: The brain demonstrates no evidence of hemorrhage,
infarction, edema, mass effect, extra-axial fluid collection,
hydrocephalus or mass lesion.  The skull is unremarkable.  Mucosal
thickening is present in the right maxillary antrum.
IMPRESSION: Unremarkable head CT.  The right maxillary mucosal sinus disease.

CT ORBITS
FINDINGS: The orbits show a normal appearance bilaterally with no
asymmetry noted.  No soft tissue swelling, hemorrhage or abscess is
identified.  No evidence of intraorbital abnormality.  The globes
are intact.  Extraocular muscles are symmetric.  No evidence of
fracture.

Sinus disease present in both maxillary antra with prominent
mucosal thickening in the right maxillary antrum and a small
retention cyst inferiorly in the left maxillary antrum.  Other
paranasal sinuses are normally aerated.
IMPRESSION: Unremarkable orbital CT.  Maxillary sinus disease present with
mucosal thickening in the right maxillary antrum and a mucous
retention cyst in the left maxillary antrum.

## 2014-01-12 ENCOUNTER — Other Ambulatory Visit: Payer: Self-pay | Admitting: Hematology & Oncology

## 2014-01-26 ENCOUNTER — Other Ambulatory Visit (HOSPITAL_BASED_OUTPATIENT_CLINIC_OR_DEPARTMENT_OTHER): Payer: BC Managed Care – PPO | Admitting: Lab

## 2014-01-26 DIAGNOSIS — D509 Iron deficiency anemia, unspecified: Secondary | ICD-10-CM

## 2014-01-26 DIAGNOSIS — N183 Chronic kidney disease, stage 3 unspecified: Secondary | ICD-10-CM

## 2014-01-26 DIAGNOSIS — I070 Rheumatic tricuspid stenosis: Secondary | ICD-10-CM

## 2014-01-26 DIAGNOSIS — E349 Endocrine disorder, unspecified: Secondary | ICD-10-CM

## 2014-01-26 DIAGNOSIS — Z954 Presence of other heart-valve replacement: Secondary | ICD-10-CM

## 2014-01-26 LAB — CBC WITH DIFFERENTIAL (CANCER CENTER ONLY)
BASO#: 0 10*3/uL (ref 0.0–0.2)
BASO%: 0.4 % (ref 0.0–2.0)
EOS%: 1.4 % (ref 0.0–7.0)
Eosinophils Absolute: 0.1 10*3/uL (ref 0.0–0.5)
HEMATOCRIT: 33.5 % — AB (ref 38.7–49.9)
HEMOGLOBIN: 11.4 g/dL — AB (ref 13.0–17.1)
LYMPH#: 1.7 10*3/uL (ref 0.9–3.3)
LYMPH%: 24.1 % (ref 14.0–48.0)
MCH: 28.9 pg (ref 28.0–33.4)
MCHC: 34 g/dL (ref 32.0–35.9)
MCV: 85 fL (ref 82–98)
MONO#: 0.6 10*3/uL (ref 0.1–0.9)
MONO%: 8.5 % (ref 0.0–13.0)
NEUT%: 65.6 % (ref 40.0–80.0)
NEUTROS ABS: 4.6 10*3/uL (ref 1.5–6.5)
Platelets: 245 10*3/uL (ref 145–400)
RBC: 3.95 10*6/uL — ABNORMAL LOW (ref 4.20–5.70)
RDW: 14.3 % (ref 11.1–15.7)
WBC: 7 10*3/uL (ref 4.0–10.0)

## 2014-01-26 LAB — PROTIME-INR (CHCC SATELLITE)
INR: 2.6 (ref 2.0–3.5)
Protime: 31.2 Seconds — ABNORMAL HIGH (ref 10.6–13.4)

## 2014-01-26 LAB — TESTOSTERONE: Testosterone: 234 ng/dL — ABNORMAL LOW (ref 300–890)

## 2014-01-27 ENCOUNTER — Telehealth: Payer: Self-pay | Admitting: *Deleted

## 2014-01-27 NOTE — Telephone Encounter (Addendum)
Message copied by Lenn Sink on Tue Jan 27, 2014 11:44 AM ------      Message from: [name]      Created: Mon Jan 26, 2014  5:28 PM       Call - testosterone is low, but getting better!! Calvin [name] ------Left voicemail informing pt that testosterone is low, but getting better.

## 2014-02-10 NOTE — Telephone Encounter (Signed)
error 

## 2014-02-23 ENCOUNTER — Encounter: Payer: Self-pay | Admitting: Hematology & Oncology

## 2014-02-23 ENCOUNTER — Ambulatory Visit (HOSPITAL_BASED_OUTPATIENT_CLINIC_OR_DEPARTMENT_OTHER): Payer: BC Managed Care – PPO | Admitting: Hematology & Oncology

## 2014-02-23 ENCOUNTER — Other Ambulatory Visit (HOSPITAL_BASED_OUTPATIENT_CLINIC_OR_DEPARTMENT_OTHER): Payer: BC Managed Care – PPO | Admitting: Lab

## 2014-02-23 VITALS — BP 157/56 | HR 64 | Temp 97.9°F | Resp 20 | Ht 72.0 in | Wt 323.0 lb

## 2014-02-23 DIAGNOSIS — E349 Endocrine disorder, unspecified: Secondary | ICD-10-CM

## 2014-02-23 DIAGNOSIS — N183 Chronic kidney disease, stage 3 unspecified: Secondary | ICD-10-CM

## 2014-02-23 DIAGNOSIS — D509 Iron deficiency anemia, unspecified: Secondary | ICD-10-CM

## 2014-02-23 DIAGNOSIS — Z952 Presence of prosthetic heart valve: Secondary | ICD-10-CM

## 2014-02-23 DIAGNOSIS — D631 Anemia in chronic kidney disease: Secondary | ICD-10-CM

## 2014-02-23 DIAGNOSIS — I070 Rheumatic tricuspid stenosis: Secondary | ICD-10-CM

## 2014-02-23 DIAGNOSIS — Z7901 Long term (current) use of anticoagulants: Secondary | ICD-10-CM

## 2014-02-23 DIAGNOSIS — E291 Testicular hypofunction: Secondary | ICD-10-CM

## 2014-02-23 DIAGNOSIS — N189 Chronic kidney disease, unspecified: Secondary | ICD-10-CM

## 2014-02-23 DIAGNOSIS — C6292 Malignant neoplasm of left testis, unspecified whether descended or undescended: Secondary | ICD-10-CM

## 2014-02-23 LAB — CBC WITH DIFFERENTIAL (CANCER CENTER ONLY)
BASO#: 0 10*3/uL (ref 0.0–0.2)
BASO%: 0.6 % (ref 0.0–2.0)
EOS%: 3.3 % (ref 0.0–7.0)
Eosinophils Absolute: 0.2 10*3/uL (ref 0.0–0.5)
HCT: 32.6 % — ABNORMAL LOW (ref 38.7–49.9)
HEMOGLOBIN: 11.3 g/dL — AB (ref 13.0–17.1)
LYMPH#: 1.6 10*3/uL (ref 0.9–3.3)
LYMPH%: 29.4 % (ref 14.0–48.0)
MCH: 28.8 pg (ref 28.0–33.4)
MCHC: 34.7 g/dL (ref 32.0–35.9)
MCV: 83 fL (ref 82–98)
MONO#: 0.5 10*3/uL (ref 0.1–0.9)
MONO%: 8.9 % (ref 0.0–13.0)
NEUT#: 3.1 10*3/uL (ref 1.5–6.5)
NEUT%: 57.8 % (ref 40.0–80.0)
Platelets: 230 10*3/uL (ref 145–400)
RBC: 3.92 10*6/uL — ABNORMAL LOW (ref 4.20–5.70)
RDW: 14.6 % (ref 11.1–15.7)
WBC: 5.4 10*3/uL (ref 4.0–10.0)

## 2014-02-23 NOTE — Progress Notes (Signed)
Hematology and Oncology Follow Up Visit  Calvin [name] [id number] 12/18/1955 58 y.o. 02/23/2014   Principle Diagnosis:  1. anemia of renal insufficiency 2. Intermittent iron deficiency anemia 3. mechanical tricuspid valve-lifelong Coumadin 4. hypo-testosteronemia  Current Therapy:   1. Aranesp 300 mcg subcu as needed for hemoglobin less than 11. 2. IV iron as indicated. 3. Coumadin - lifelong.     Interim History:  Mr.  Calvin [name] is back for followup. He feels pretty good. He has had no problems with his heart. He's been in normal rhythm. He says the cardiologist my want to go on a medication which he doesn't really want. He has had no obvious bleeding. He is on Coumadin. His levels have been doing quite well.  He last had iron back in June. He last got Aranesp back in August.  There's been no shortness of breath. He's had no nausea vomiting. His leg swelling has gotten a little bit better.     Medications: Current outpatient prescriptions:acetaminophen (TYLENOL) 500 MG tablet, Take 500 mg by mouth as needed. For pain, Disp: , Rfl: ;  amLODipine (NORVASC) 10 MG tablet, Take 1 tablet (10 mg total) by mouth daily., Disp: 90 tablet, Rfl: 3;  amoxicillin (AMOXIL) 500 MG capsule, Before dental appts., Disp: , Rfl: ;  b complex vitamins tablet, Take 1 tablet by mouth daily. , Disp: , Rfl:  cloNIDine (CATAPRES) 0.1 MG tablet, Take 1 tablet (0.1 mg total) by mouth 3 (three) times daily., Disp: 270 tablet, Rfl: 3;  COLCRYS 0.6 MG tablet, Take 0.6 mg by mouth as needed. , Disp: , Rfl: ;  cyclobenzaprine (FLEXERIL) 10 MG tablet, Take 5 mg by mouth at bedtime as needed. , Disp: , Rfl: ;  furosemide (LASIX) 20 MG tablet, Take 20 mg by mouth daily. May take 40 mg if he feels he needs it, Disp: , Rfl:  HYDROcodone-acetaminophen (VICODIN) 5-500 MG per tablet, Take 1 tablet by mouth as needed for pain., Disp: , Rfl: ;  labetalol (NORMODYNE) 200 MG tablet, 2 tabs am 1 tab pm, Disp: 270 tablet, Rfl: 1;   metoprolol (LOPRESSOR) 100 MG tablet, TAKE 1 TABLET BY MOUTH ONCE DAILY., Disp: 90 tablet, Rfl: 0;  olmesartan (BENICAR) 40 MG tablet, Take 1 tablet (40 mg total) by mouth daily., Disp: 90 tablet, Rfl: 3 pantoprazole (PROTONIX) 40 MG tablet, Take 40 mg by mouth daily. , Disp: , Rfl: ;  predniSONE (DELTASONE) 5 MG tablet, Take 5 mg by mouth. "As needed for gout", Disp: , Rfl: ;  sildenafil (VIAGRA) 50 MG tablet, Take 50 mg by mouth daily as needed. , Disp: , Rfl: ;  sodium bicarbonate 650 MG tablet, Take 650 mg by mouth 2 (two) times daily. PT NOT SURE OF DOSAGE--, Disp: , Rfl:  Testosterone 10 MG/ACT (2%) GEL, APPLY 2 PUMPS TO THIGHS AS DIRECTED, Disp: 60 g, Rfl: 2;  ULORIC 80 MG TABS, Take 80 mg by mouth daily., Disp: , Rfl: ;  VOLTAREN 1 % GEL, Apply 2 g topically as needed. , Disp: , Rfl: ;  warfarin (COUMADIN) 7.5 MG tablet, Take 1 tablet (7.5 mg total) by mouth every morning. TAKES 7.5 MG Q. DAY  EXCEPT Wednesday  HE TAKES 11.25 MG., Disp: 90 tablet, Rfl: 3  Allergies:  Allergies  Allergen Reactions  . Ace Inhibitors Anxiety    Diffuse swelling No issues with Benicar Edema Diffuse swelling No issues with Benicar    Past Medical History, Surgical history, Social history, and Family History were reviewed  and updated.  Review of Systems: As above  Physical Exam:  height is 6' (1.829 m) and weight is 323 lb (146.512 kg). His oral temperature is 97.9 F (36.6 C). His blood pressure is 157/56 and his pulse is 64. His respiration is 20.   Well-developed and well-nourished white cell and. He is somewhat overweight. Head and neck exam shows no ocular or oral lesions. There is no palpable cervical or supraclavicular lymph nodes. Lungs are clear. Cardiac exam shows a click from his mechanical tricuspid valve. This is a systolic click. He has no murmurs. Abdomen is soft. He is obese. He has good bowel sounds. There is no palpable liver or spleen tip. Back exam shows no tenderness over the spine ribs  or hips. Extremities shows no clubbing cyanosis or edema. He may have a little bit of edema in his lower legs. Skin exam no rashes ecchymosis or petechia. Neurological exam is nonfocal.  Lab Results  Component Value Date   WBC 5.4 02/23/2014   HGB 11.3* 02/23/2014   HCT 32.6* 02/23/2014   MCV 83 02/23/2014   PLT 230 02/23/2014     Chemistry      Component Value Date/Time   NA 139 12/24/2013 1415   NA 138 11/18/2013 0920   NA 141 10/16/2013 1033   K 4.4 12/24/2013 1415   K 4.4 11/18/2013 0920   K 4.4 10/16/2013 1033   CL 107 12/24/2013 1415   CL 102 10/16/2013 1033   CO2 22 12/24/2013 1415   CO2 24 11/18/2013 0920   CO2 26 10/16/2013 1033   BUN 44* 12/24/2013 1415   BUN 48.8* 11/18/2013 0920   BUN 55* 10/16/2013 1033   CREATININE 2.05* 12/24/2013 1415   CREATININE 2.3* 11/18/2013 0920   CREATININE 2.4* 10/16/2013 1033      Component Value Date/Time   CALCIUM 8.7 12/24/2013 1415   CALCIUM 9.3 11/18/2013 0920   CALCIUM 9.1 10/16/2013 1033   ALKPHOS 51 11/18/2013 0920   ALKPHOS 39 10/16/2013 1033   ALKPHOS 44 08/28/2013 0837   AST 25 11/18/2013 0920   AST 22 10/16/2013 1033   AST 15 08/28/2013 0837   ALT 34 11/18/2013 0920   ALT 21 10/16/2013 1033   ALT 16 08/28/2013 0837   BILITOT 0.32 11/18/2013 0920   BILITOT 0.80 10/16/2013 1033   BILITOT 0.5 08/28/2013 0837      Impression and Plan: Mr. Calvin [name] is 58 year old gentleman with multiple anemia etiologies. His blood counts he is doing quite well right now. Again, he responds well to iron and Aranesp. He does not need any Aranesp today.  We will see what his iron levels are.  His testosterone level is on the low side. This might be causing to have some fatigue.  I will continue to follow his blood work monthly.   I will see him back myself in 3 months.   I spent a good half hour with him today. I went over his labs. I reviewed his blood smear.   Volanda Napoleon, MD 10/26/201510:07 AM

## 2014-02-24 ENCOUNTER — Telehealth: Payer: Self-pay | Admitting: *Deleted

## 2014-02-24 NOTE — Telephone Encounter (Addendum)
Left message on personal voice mail.  Message copied by Cordelia Poche on Tue Feb 24, 2014  1:41 PM ------      Message from: Volanda Napoleon      Created: Mon Feb 23, 2014  6:44 PM       Call - coumadin level is good (INR is 2.26).  Renal function is stable.  Testosterone level is still low.  Pete ------

## 2014-02-26 LAB — PROTHROMBIN TIME
INR: 2.25 — ABNORMAL HIGH (ref <1.50)
Prothrombin Time: 24.9 seconds — ABNORMAL HIGH (ref 11.6–15.2)

## 2014-02-26 LAB — SOLUBLE TRANSFERRIN RECEPTOR: Transferrin Receptor, Soluble: 1.34 mg/L (ref 0.76–1.76)

## 2014-02-26 LAB — BASIC METABOLIC PANEL
BUN: 49 mg/dL — ABNORMAL HIGH (ref 6–23)
CALCIUM: 9 mg/dL (ref 8.4–10.5)
CO2: 22 meq/L (ref 19–32)
Chloride: 108 mEq/L (ref 96–112)
Creatinine, Ser: 2.17 mg/dL — ABNORMAL HIGH (ref 0.50–1.35)
GLUCOSE: 101 mg/dL — AB (ref 70–99)
POTASSIUM: 4.5 meq/L (ref 3.5–5.3)
Sodium: 139 mEq/L (ref 135–145)

## 2014-02-26 LAB — TESTOSTERONE: Testosterone: 248 ng/dL — ABNORMAL LOW (ref 300–890)

## 2014-03-30 ENCOUNTER — Ambulatory Visit (HOSPITAL_BASED_OUTPATIENT_CLINIC_OR_DEPARTMENT_OTHER): Payer: BC Managed Care – PPO

## 2014-03-30 ENCOUNTER — Other Ambulatory Visit (HOSPITAL_BASED_OUTPATIENT_CLINIC_OR_DEPARTMENT_OTHER): Payer: BC Managed Care – PPO | Admitting: Lab

## 2014-03-30 VITALS — BP 121/45 | HR 59 | Temp 98.2°F | Resp 16

## 2014-03-30 DIAGNOSIS — N189 Chronic kidney disease, unspecified: Principal | ICD-10-CM

## 2014-03-30 DIAGNOSIS — D631 Anemia in chronic kidney disease: Secondary | ICD-10-CM

## 2014-03-30 DIAGNOSIS — I070 Rheumatic tricuspid stenosis: Secondary | ICD-10-CM

## 2014-03-30 DIAGNOSIS — E349 Endocrine disorder, unspecified: Secondary | ICD-10-CM

## 2014-03-30 DIAGNOSIS — N183 Chronic kidney disease, stage 3 unspecified: Secondary | ICD-10-CM

## 2014-03-30 DIAGNOSIS — D509 Iron deficiency anemia, unspecified: Secondary | ICD-10-CM

## 2014-03-30 DIAGNOSIS — D649 Anemia, unspecified: Secondary | ICD-10-CM

## 2014-03-30 LAB — CBC WITH DIFFERENTIAL (CANCER CENTER ONLY)
BASO#: 0 10*3/uL (ref 0.0–0.2)
BASO%: 0.8 % (ref 0.0–2.0)
EOS%: 4.7 % (ref 0.0–7.0)
Eosinophils Absolute: 0.2 10*3/uL (ref 0.0–0.5)
HCT: 30.6 % — ABNORMAL LOW (ref 38.7–49.9)
HEMOGLOBIN: 10.4 g/dL — AB (ref 13.0–17.1)
LYMPH#: 0.9 10*3/uL (ref 0.9–3.3)
LYMPH%: 19.4 % (ref 14.0–48.0)
MCH: 28.9 pg (ref 28.0–33.4)
MCHC: 34 g/dL (ref 32.0–35.9)
MCV: 85 fL (ref 82–98)
MONO#: 0.5 10*3/uL (ref 0.1–0.9)
MONO%: 9.5 % (ref 0.0–13.0)
NEUT#: 3.2 10*3/uL (ref 1.5–6.5)
NEUT%: 65.6 % (ref 40.0–80.0)
Platelets: 206 10*3/uL (ref 145–400)
RBC: 3.6 10*6/uL — ABNORMAL LOW (ref 4.20–5.70)
RDW: 14.3 % (ref 11.1–15.7)
WBC: 4.9 10*3/uL (ref 4.0–10.0)

## 2014-03-30 LAB — BASIC METABOLIC PANEL
BUN: 44 mg/dL — AB (ref 6–23)
CHLORIDE: 105 meq/L (ref 96–112)
CO2: 22 mEq/L (ref 19–32)
CREATININE: 2.39 mg/dL — AB (ref 0.50–1.35)
Calcium: 8.9 mg/dL (ref 8.4–10.5)
Glucose, Bld: 120 mg/dL — ABNORMAL HIGH (ref 70–99)
Potassium: 4.8 mEq/L (ref 3.5–5.3)
Sodium: 137 mEq/L (ref 135–145)

## 2014-03-30 LAB — PROTHROMBIN TIME
INR: 2.28 — ABNORMAL HIGH (ref <1.50)
Prothrombin Time: 25.1 seconds — ABNORMAL HIGH (ref 11.6–15.2)

## 2014-03-30 LAB — TESTOSTERONE: Testosterone: 313 ng/dL (ref 300–890)

## 2014-03-30 MED ORDER — DARBEPOETIN ALFA 300 MCG/0.6ML IJ SOSY
300.0000 ug | PREFILLED_SYRINGE | Freq: Once | INTRAMUSCULAR | Status: AC
  Administered 2014-03-30: 300 ug via SUBCUTANEOUS

## 2014-03-30 MED ORDER — DARBEPOETIN ALFA 300 MCG/0.6ML IJ SOSY
PREFILLED_SYRINGE | INTRAMUSCULAR | Status: AC
  Filled 2014-03-30: qty 0.6

## 2014-03-30 NOTE — Patient Instructions (Signed)
Darbepoetin Alfa injection What is this medicine? DARBEPOETIN ALFA (dar be POE e tin AL fa) helps your body make more red blood cells. It is used to treat anemia caused by chronic kidney failure and chemotherapy. This medicine may be used for other purposes; ask your health care provider or pharmacist if you have questions. COMMON BRAND NAME(S): Aranesp What should I tell my health care provider before I take this medicine? They need to know if you have any of these conditions: -blood clotting disorders or history of blood clots -cancer patient not on chemotherapy -cystic fibrosis -heart disease, such as angina, heart failure, or a history of a heart attack -hemoglobin level of 12 g/dL or greater -high blood pressure -low levels of folate, iron, or vitamin B12 -seizures -an unusual or allergic reaction to darbepoetin, erythropoietin, albumin, hamster proteins, latex, other medicines, foods, dyes, or preservatives -pregnant or trying to get pregnant -breast-feeding How should I use this medicine? This medicine is for injection into a vein or under the skin. It is usually given by a health care professional in a hospital or clinic setting. If you get this medicine at home, you will be taught how to prepare and give this medicine. Do not shake the solution before you withdraw a dose. Use exactly as directed. Take your medicine at regular intervals. Do not take your medicine more often than directed. It is important that you put your used needles and syringes in a special sharps container. Do not put them in a trash can. If you do not have a sharps container, call your pharmacist or healthcare provider to get one. Talk to your pediatrician regarding the use of this medicine in children. While this medicine may be used in children as young as 1 year for selected conditions, precautions do apply. Overdosage: If you think you have taken too much of this medicine contact a poison control center or  emergency room at once. NOTE: This medicine is only for you. Do not share this medicine with others. What if I miss a dose? If you miss a dose, take it as soon as you can. If it is almost time for your next dose, take only that dose. Do not take double or extra doses. What may interact with this medicine? Do not take this medicine with any of the following medications: -epoetin alfa This list may not describe all possible interactions. Give your health care provider a list of all the medicines, herbs, non-prescription drugs, or dietary supplements you use. Also tell them if you smoke, drink alcohol, or use illegal drugs. Some items may interact with your medicine. What should I watch for while using this medicine? Visit your prescriber or health care professional for regular checks on your progress and for the needed blood tests and blood pressure measurements. It is especially important for the doctor to make sure your hemoglobin level is in the desired range, to limit the risk of potential side effects and to give you the best benefit. Keep all appointments for any recommended tests. Check your blood pressure as directed. Ask your doctor what your blood pressure should be and when you should contact him or her. As your body makes more red blood cells, you may need to take iron, folic acid, or vitamin B supplements. Ask your doctor or health care provider which products are right for you. If you have kidney disease continue dietary restrictions, even though this medication can make you feel better. Talk with your doctor or health   care professional about the foods you eat and the vitamins that you take. What side effects may I notice from receiving this medicine? Side effects that you should report to your doctor or health care professional as soon as possible: -allergic reactions like skin rash, itching or hives, swelling of the face, lips, or tongue -breathing problems -changes in vision -chest  pain -confusion, trouble speaking or understanding -feeling faint or lightheaded, falls -high blood pressure -muscle aches or pains -pain, swelling, warmth in the leg -rapid weight gain -severe headaches -sudden numbness or weakness of the face, arm or leg -trouble walking, dizziness, loss of balance or coordination -seizures (convulsions) -swelling of the ankles, feet, hands -unusually weak or tired Side effects that usually do not require medical attention (report to your doctor or health care professional if they continue or are bothersome): -diarrhea -fever, chills (flu-like symptoms) -headaches -nausea, vomiting -redness, stinging, or swelling at site where injected This list may not describe all possible side effects. Call your doctor for medical advice about side effects. You may report side effects to FDA at 1-800-FDA-1088. Where should I keep my medicine? Keep out of the reach of children. Store in a refrigerator between 2 and 8 degrees C (36 and 46 degrees F). Do not freeze. Do not shake. Throw away any unused portion if using a single-dose vial. Throw away any unused medicine after the expiration date. NOTE: This sheet is a summary. It may not cover all possible information. If you have questions about this medicine, talk to your doctor, pharmacist, or health care provider.  2015, Elsevier/Gold Standard. (2008-03-31 10:23:57)  

## 2014-03-31 ENCOUNTER — Telehealth: Payer: Self-pay | Admitting: *Deleted

## 2014-03-31 NOTE — Telephone Encounter (Addendum)
-----   Message from Calvin Napoleon, MD sent at 03/30/2014  6:55 PM EST ----- Call and let her know that the INR is normal. Calvin [name] -Informed pt that INR is normal and renal function is good. Testosterone is a little low. Pt verbalized understanding.

## 2014-04-01 ENCOUNTER — Other Ambulatory Visit: Payer: Self-pay | Admitting: Cardiology

## 2014-04-01 ENCOUNTER — Other Ambulatory Visit: Payer: Self-pay | Admitting: Hematology & Oncology

## 2014-04-03 ENCOUNTER — Other Ambulatory Visit: Payer: Self-pay | Admitting: *Deleted

## 2014-04-03 DIAGNOSIS — I4891 Unspecified atrial fibrillation: Secondary | ICD-10-CM

## 2014-04-03 MED ORDER — LABETALOL HCL 200 MG PO TABS: ORAL_TABLET | ORAL | Status: DC

## 2014-04-07 ENCOUNTER — Encounter: Payer: Self-pay | Admitting: Hematology & Oncology

## 2014-04-07 NOTE — Progress Notes (Signed)
HPI: FU paroxysmal atrial fibrillation/flutter, s/p aortic valve replacement, aortic coarctation, aortic aneurysm, coronary artery disease on CT scan and CKD. Patient is s/p coarctation repair in 1958 and redo in 1971. Had endovascular stent in 1995 and 1996. Had aortic balloon valvuloplasty in 1974 and AVR in 1994. 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 Stent in distal arch, proximal descending aorta patent. Mildly enlarged lymph nodes. He is seen at the [facility] clinic on a yearly basis for followup of above. Echocardiogram in December of 2014 showed normal LV function, mechanical aortic valve, moderate left atrial enlargement and stable gradient across presumed coarctation repair. Patient last seen in [facility] in 10/2013. Plan is to obtain Chest CT without contrast next year to survey his thoracic aorta.   Had repeat DCCV for atrial fibrillation 8/15. Also has renal insuff followed by nephrology. Since last seen, There is mild dyspnea on exertion but no orthopnea, PND, pedal edema, palpitations, syncope or chest pain.   Studies: - Echo (12/14): Mod LVH, EF 55-60%, AVR ok (mean 18 mmHg), mod LAE, stable gradient acrose coarctation repair but restenosis likely - pt followed at [facility]  Current Outpatient Prescriptions  Medication Sig Dispense Refill  . acetaminophen (TYLENOL) 500 MG tablet Take 500 mg by mouth as needed. For pain    . amLODipine (NORVASC) 10 MG tablet TAKE 1 TABLET (10 MG TOTAL) BY MOUTH DAILY. 90 tablet 3  . amoxicillin (AMOXIL) 500 MG capsule Before dental appts.    Marland Kitchen b complex vitamins tablet Take 1 tablet by mouth daily.     . cloNIDine (CATAPRES) 0.1 MG tablet Take 1 tablet (0.1 mg total) by mouth 3 (three) times daily. 270 tablet 3  . COLCRYS 0.6 MG tablet Take 0.6 mg by mouth as needed.     . cyclobenzaprine (FLEXERIL) 10 MG tablet Take 5 mg by mouth at bedtime as needed (Muscle Spasm---stated by  patient).     . furosemide (LASIX) 20 MG tablet Take 20 mg by mouth daily. May take 40 mg if he feels he needs it    . HYDROcodone-acetaminophen (VICODIN) 5-500 MG per tablet Take 1 tablet by mouth as needed for pain.    Marland Kitchen labetalol (NORMODYNE) 200 MG tablet 2 tabs am 1 tab pm 270 tablet 0  . metoprolol (LOPRESSOR) 100 MG tablet TAKE 1 TABLET BY MOUTH ONCE DAILY. 90 tablet 0  . olmesartan (BENICAR) 40 MG tablet Take 1 tablet (40 mg total) by mouth daily. 90 tablet 3  . pantoprazole (PROTONIX) 40 MG tablet Take 40 mg by mouth daily.     . predniSONE (DELTASONE) 5 MG tablet Take 5 mg by mouth. "As needed for gout"    . sildenafil (VIAGRA) 50 MG tablet Take 50 mg by mouth daily as needed.     . sodium bicarbonate 650 MG tablet Take 650 mg by mouth 2 (two) times daily. PT NOT SURE OF DOSAGE--    . Testosterone 10 MG/ACT (2%) GEL APPLY 2 PUMPS TO THIGHS AS DIRECTED 60 g 2  . ULORIC 80 MG TABS Take 80 mg by mouth daily.    . VOLTAREN 1 % GEL Apply 2 g topically as needed.     . warfarin (COUMADIN) 7.5 MG tablet Take 1 tablet (7.5 mg total) by mouth every morning. TAKES 7.5 MG Q. DAY  EXCEPT Wednesday  HE TAKES 11.25 MG. 90 tablet 3   No current facility-administered medications for this visit.  Past Medical History  Diagnosis Date  . 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 HTN (hypertension)   . CAD (coronary artery disease)   . Aortic aneurysm   . Gout   . Atrial fibrillation   . Anemia   . Hyperlipidemia   . Atrial flutter   . Obesity   . Anemia of renal disease 10/04/2011  . CKD (chronic kidney disease)     baseline creatinine is 1.5-2  . Gastric polyp 2007, 06/2012    benign.  . Colonic ulcer 2007    felt to be secondary to NSAID  . Testicular adenoma   . PONV (postoperative nausea and vomiting)     Past Surgical History  Procedure Laterality Date  . Coarctation of aorta repair  1958  . Coarctation of aorta repair  1970  . Aortic valve replacement and mitral valve  repair  1974    Bicuspid aortic valve  . Orchiectomy  1992    testicular cancer  . Aortic valve replacement  1995    [facility]  . Tonsillectomy    . Cardioversion  02/27/2012    Procedure: CARDIOVERSION;  Surgeon: Lelon Perla, MD;  Location: [facility] ENDOSCOPY;  Service: Cardiovascular;  Laterality: N/A;  . Esophagogastroduodenoscopy N/A 07/04/2012    Procedure: ESOPHAGOGASTRODUODENOSCOPY (EGD);  Surgeon: Inda Castle, MD;  Location: [place];  Service: Endoscopy;  Laterality: N/A;  . Cardioversion N/A 05/29/2013    Procedure: CARDIOVERSION;  Surgeon: Lelon Perla, MD;  Location: [facility] ENDOSCOPY;  Service: Cardiovascular;  Laterality: N/A;  . Cardioversion N/A 12/15/2013    Procedure: CARDIOVERSION;  Surgeon: Sueanne Margarita, MD;  Location: [facility] ENDOSCOPY;  Service: Cardiovascular;  Laterality: N/A;    History   Social History  . Marital Status: Married    Spouse Name: N/A    Number of Children: 4  . Years of Education: N/A   Occupational History  . Not on file.   Social History Main Topics  . Smoking status: Former Smoker -- 0.25 packs/day for 7 years    Types: Cigarettes    Start date: 05/28/1975    Quit date: 05/27/1981  . Smokeless tobacco: Never Used     Comment: quit 25 years ago  . Alcohol Use: Yes     Comment: wine, occasionally  . Drug Use: No  . Sexual Activity: Not on file   Other Topics Concern  . Not on file   Social History Narrative   Pt lives in [place] with wife.  3 children ages 10,20,22 (all 3 are adopted)   Sales grinding wheels.    ROS: no fevers or chills, productive cough, hemoptysis, dysphasia, odynophagia, melena, hematochezia, dysuria, hematuria, rash, seizure activity, orthopnea, PND, pedal edema, claudication. Remaining systems are negative.  Physical Exam: Well-developed well-nourished in no acute distress.  Skin is warm and dry.  HEENT is normal.  Neck is supple.  Chest is clear to auscultation with normal expansion.    Cardiovascular exam is regular rate and rhythm. 2/6 systolic murmur left sternal border. Abdominal exam nontender or distended. No masses palpated. Extremities show no edema. neuro grossly intact  ECG Sinus rhythm at a rate of 63. First-degree AV block. IVCD. Lateral T-wave inversion.

## 2014-04-08 ENCOUNTER — Encounter: Payer: Self-pay | Admitting: *Deleted

## 2014-04-08 ENCOUNTER — Encounter: Payer: Self-pay | Admitting: Cardiology

## 2014-04-08 ENCOUNTER — Ambulatory Visit (INDEPENDENT_AMBULATORY_CARE_PROVIDER_SITE_OTHER): Payer: BC Managed Care – PPO | Admitting: Cardiology

## 2014-04-08 VITALS — BP 112/58 | HR 63 | Ht 72.0 in | Wt 325.8 lb

## 2014-04-08 DIAGNOSIS — N183 Chronic kidney disease, stage 3 unspecified: Secondary | ICD-10-CM

## 2014-04-08 DIAGNOSIS — I712 Thoracic aortic aneurysm, without rupture, unspecified: Secondary | ICD-10-CM

## 2014-04-08 DIAGNOSIS — Q251 Coarctation of aorta: Secondary | ICD-10-CM

## 2014-04-08 DIAGNOSIS — Z952 Presence of prosthetic heart valve: Secondary | ICD-10-CM

## 2014-04-08 DIAGNOSIS — Z954 Presence of other heart-valve replacement: Secondary | ICD-10-CM

## 2014-04-08 DIAGNOSIS — I480 Paroxysmal atrial fibrillation: Secondary | ICD-10-CM

## 2014-04-08 NOTE — Assessment & Plan Note (Signed)
Blood pressure controlled. Continue present medications. 

## 2014-04-08 NOTE — Assessment & Plan Note (Signed)
Patient remains in sinus rhythm. Continue beta blocker and Coumadin. We could consider amiodarone in the future if his symptoms become more frequent.

## 2014-04-08 NOTE — Assessment & Plan Note (Signed)
Followed by nephrology. 

## 2014-04-08 NOTE — Assessment & Plan Note (Signed)
Continue SBE prophylaxis. 

## 2014-04-08 NOTE — Assessment & Plan Note (Signed)
CT is to be scheduled at the [facility] clinic in July 2016.

## 2014-04-08 NOTE — Patient Instructions (Signed)
Your physician wants you to follow-up in: 6 MONTHS WITH DR CRENSHAW You will receive a reminder letter in the mail two months in advance. If you don't receive a letter, please call our office to schedule the follow-up appointment.  

## 2014-04-08 NOTE — Progress Notes (Signed)
Patient sent email verifying flu vaccine information and tetanus information as well as medication update

## 2014-04-08 NOTE — Assessment & Plan Note (Signed)
CT planned at the [facility] clinic in July 2016.

## 2014-04-17 ENCOUNTER — Other Ambulatory Visit: Payer: Self-pay | Admitting: Hematology & Oncology

## 2014-04-27 ENCOUNTER — Other Ambulatory Visit (HOSPITAL_BASED_OUTPATIENT_CLINIC_OR_DEPARTMENT_OTHER): Payer: BC Managed Care – PPO | Admitting: Lab

## 2014-04-27 DIAGNOSIS — D509 Iron deficiency anemia, unspecified: Secondary | ICD-10-CM

## 2014-04-27 DIAGNOSIS — I070 Rheumatic tricuspid stenosis: Secondary | ICD-10-CM

## 2014-04-27 DIAGNOSIS — N183 Chronic kidney disease, stage 3 unspecified: Secondary | ICD-10-CM

## 2014-04-27 DIAGNOSIS — D649 Anemia, unspecified: Secondary | ICD-10-CM

## 2014-04-27 DIAGNOSIS — E349 Endocrine disorder, unspecified: Secondary | ICD-10-CM

## 2014-04-27 LAB — CBC WITH DIFFERENTIAL (CANCER CENTER ONLY)
BASO#: 0 10*3/uL (ref 0.0–0.2)
BASO%: 0.4 % (ref 0.0–2.0)
EOS%: 3.8 % (ref 0.0–7.0)
Eosinophils Absolute: 0.2 10*3/uL (ref 0.0–0.5)
HEMATOCRIT: 36 % — AB (ref 38.7–49.9)
HEMOGLOBIN: 12.4 g/dL — AB (ref 13.0–17.1)
LYMPH#: 1.2 10*3/uL (ref 0.9–3.3)
LYMPH%: 24.5 % (ref 14.0–48.0)
MCH: 29.3 pg (ref 28.0–33.4)
MCHC: 34.4 g/dL (ref 32.0–35.9)
MCV: 85 fL (ref 82–98)
MONO#: 0.5 10*3/uL (ref 0.1–0.9)
MONO%: 9.6 % (ref 0.0–13.0)
NEUT#: 2.9 10*3/uL (ref 1.5–6.5)
NEUT%: 61.7 % (ref 40.0–80.0)
PLATELETS: 241 10*3/uL (ref 145–400)
RBC: 4.23 10*6/uL (ref 4.20–5.70)
RDW: 14.2 % (ref 11.1–15.7)
WBC: 4.7 10*3/uL (ref 4.0–10.0)

## 2014-04-28 ENCOUNTER — Telehealth: Payer: Self-pay | Admitting: *Deleted

## 2014-04-28 ENCOUNTER — Other Ambulatory Visit: Payer: Self-pay | Admitting: *Deleted

## 2014-04-28 DIAGNOSIS — D509 Iron deficiency anemia, unspecified: Secondary | ICD-10-CM

## 2014-04-28 NOTE — Telephone Encounter (Addendum)
Spoke with patient and appointment made for next week.   ----- Message from Volanda Napoleon, MD sent at 04/28/2014  2:57 PM EST ----- Call and let him know that the Coumadin level is a little bit low. I would not change his dose. I would just check his INR in one week. Please set this up. Thanks

## 2014-05-04 LAB — BASIC METABOLIC PANEL
BUN: 49 mg/dL — AB (ref 6–23)
CALCIUM: 9.4 mg/dL (ref 8.4–10.5)
CO2: 21 mEq/L (ref 19–32)
Chloride: 105 mEq/L (ref 96–112)
Creatinine, Ser: 2.19 mg/dL — ABNORMAL HIGH (ref 0.50–1.35)
GLUCOSE: 97 mg/dL (ref 70–99)
Potassium: 5 mEq/L (ref 3.5–5.3)
Sodium: 135 mEq/L (ref 135–145)

## 2014-05-04 LAB — PROTHROMBIN TIME
INR: 1.63 — ABNORMAL HIGH (ref <1.50)
Prothrombin Time: 19.3 seconds — ABNORMAL HIGH (ref 11.6–15.2)

## 2014-05-04 LAB — TRANSFERRIN RECEPTOR, SOLUABLE: Transferrin Receptor, Soluble: 1.87 mg/L — ABNORMAL HIGH (ref 0.76–1.76)

## 2014-05-04 LAB — TESTOSTERONE: Testosterone: 378 ng/dL (ref 300–890)

## 2014-05-08 ENCOUNTER — Other Ambulatory Visit (HOSPITAL_BASED_OUTPATIENT_CLINIC_OR_DEPARTMENT_OTHER): Payer: BLUE CROSS/BLUE SHIELD | Admitting: Lab

## 2014-05-08 DIAGNOSIS — Z952 Presence of prosthetic heart valve: Secondary | ICD-10-CM

## 2014-05-08 DIAGNOSIS — D509 Iron deficiency anemia, unspecified: Secondary | ICD-10-CM

## 2014-05-08 LAB — PROTIME-INR (CHCC SATELLITE)
INR: 2.2 (ref 2.0–3.5)
Protime: 26.4 Seconds — ABNORMAL HIGH (ref 10.6–13.4)

## 2014-05-18 ENCOUNTER — Encounter: Payer: Self-pay | Admitting: Cardiology

## 2014-05-26 ENCOUNTER — Ambulatory Visit: Payer: BC Managed Care – PPO

## 2014-05-26 ENCOUNTER — Ambulatory Visit (HOSPITAL_BASED_OUTPATIENT_CLINIC_OR_DEPARTMENT_OTHER): Payer: BLUE CROSS/BLUE SHIELD | Admitting: Lab

## 2014-05-26 ENCOUNTER — Ambulatory Visit (HOSPITAL_BASED_OUTPATIENT_CLINIC_OR_DEPARTMENT_OTHER): Payer: BLUE CROSS/BLUE SHIELD | Admitting: Hematology & Oncology

## 2014-05-26 ENCOUNTER — Telehealth: Payer: Self-pay | Admitting: Hematology & Oncology

## 2014-05-26 VITALS — BP 152/57 | HR 62 | Temp 97.9°F | Resp 20 | Ht 71.0 in | Wt 327.0 lb

## 2014-05-26 DIAGNOSIS — C6292 Malignant neoplasm of left testis, unspecified whether descended or undescended: Secondary | ICD-10-CM

## 2014-05-26 DIAGNOSIS — D509 Iron deficiency anemia, unspecified: Secondary | ICD-10-CM

## 2014-05-26 DIAGNOSIS — N189 Chronic kidney disease, unspecified: Secondary | ICD-10-CM

## 2014-05-26 DIAGNOSIS — D631 Anemia in chronic kidney disease: Secondary | ICD-10-CM

## 2014-05-26 DIAGNOSIS — I481 Persistent atrial fibrillation: Secondary | ICD-10-CM

## 2014-05-26 DIAGNOSIS — Z7901 Long term (current) use of anticoagulants: Secondary | ICD-10-CM

## 2014-05-26 DIAGNOSIS — I4819 Other persistent atrial fibrillation: Secondary | ICD-10-CM

## 2014-05-26 DIAGNOSIS — C6291 Malignant neoplasm of right testis, unspecified whether descended or undescended: Secondary | ICD-10-CM

## 2014-05-26 LAB — CBC WITH DIFFERENTIAL (CANCER CENTER ONLY)
BASO#: 0 10*3/uL (ref 0.0–0.2)
BASO%: 0.6 % (ref 0.0–2.0)
EOS%: 4.2 % (ref 0.0–7.0)
Eosinophils Absolute: 0.2 10*3/uL (ref 0.0–0.5)
HCT: 33.7 % — ABNORMAL LOW (ref 38.7–49.9)
HGB: 11.7 g/dL — ABNORMAL LOW (ref 13.0–17.1)
LYMPH#: 1.5 10*3/uL (ref 0.9–3.3)
LYMPH%: 27.9 % (ref 14.0–48.0)
MCH: 29.1 pg (ref 28.0–33.4)
MCHC: 34.7 g/dL (ref 32.0–35.9)
MCV: 84 fL (ref 82–98)
MONO#: 0.5 10*3/uL (ref 0.1–0.9)
MONO%: 10.4 % (ref 0.0–13.0)
NEUT%: 56.9 % (ref 40.0–80.0)
NEUTROS ABS: 3 10*3/uL (ref 1.5–6.5)
PLATELETS: 237 10*3/uL (ref 145–400)
RBC: 4.02 10*6/uL — AB (ref 4.20–5.70)
RDW: 13.6 % (ref 11.1–15.7)
WBC: 5.2 10*3/uL (ref 4.0–10.0)

## 2014-05-26 LAB — COMPREHENSIVE METABOLIC PANEL
ALK PHOS: 46 U/L (ref 39–117)
ALT: 38 U/L (ref 0–53)
AST: 22 U/L (ref 0–37)
Albumin: 4 g/dL (ref 3.5–5.2)
BUN: 45 mg/dL — ABNORMAL HIGH (ref 6–23)
CHLORIDE: 104 meq/L (ref 96–112)
CO2: 21 meq/L (ref 19–32)
Calcium: 9.1 mg/dL (ref 8.4–10.5)
Creatinine, Ser: 2.24 mg/dL — ABNORMAL HIGH (ref 0.50–1.35)
Glucose, Bld: 89 mg/dL (ref 70–99)
POTASSIUM: 4.8 meq/L (ref 3.5–5.3)
SODIUM: 136 meq/L (ref 135–145)
TOTAL PROTEIN: 6.1 g/dL (ref 6.0–8.3)
Total Bilirubin: 0.5 mg/dL (ref 0.2–1.2)

## 2014-05-26 LAB — PROTIME-INR (CHCC SATELLITE)
INR: 1.8 — ABNORMAL LOW (ref 2.0–3.5)
Protime: 21.6 Seconds — ABNORMAL HIGH (ref 10.6–13.4)

## 2014-05-26 LAB — IRON AND TIBC CHCC
%SAT: 31 % (ref 20–55)
Iron: 81 ug/dL (ref 42–163)
TIBC: 263 ug/dL (ref 202–409)
UIBC: 181 ug/dL (ref 117–376)

## 2014-05-26 LAB — FERRITIN CHCC

## 2014-05-26 LAB — CHCC SATELLITE - SMEAR

## 2014-05-26 LAB — TESTOSTERONE: Testosterone: 478 ng/dL (ref 300–890)

## 2014-05-26 NOTE — Telephone Encounter (Signed)
Pt aware of feb,mar,apr appointments. He moved march out a week, I sent MD inbasket

## 2014-05-26 NOTE — Progress Notes (Signed)
Hematology and Oncology Follow Up Visit  Calvin [name] [id number] 1955-06-04 59 y.o. 05/26/2014   Principle Diagnosis:  1. anemia of renal insufficiency 2. Intermittent iron deficiency anemia 3. mechanical tricuspid valve-lifelong Coumadin 4. hypo-testosteronemia  Current Therapy:   1. Aranesp 300 mcg subcu as needed for hemoglobin less than 11. 2. IV iron as indicated. 3. Coumadin - lifelong.     Interim History:  [name] is back for followup. He feels pretty good. He has had no problems with his heart. He's been in normal rhythm.   He had a good Christmas. He's been traveling more than he would like with his work. 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  He's not noted any cough. He's had no change in bowel or bladder habits. He's had some chronic leg swelling. He's had no rashes.  He last had iron back in June. He last got Aranesp back in August.  There's been no shortness of breath.   He's not lost any weight. I keep getting on him about this. He really needs to lose weight or he is at significant risk for diabetes.   Medications:  Current outpatient prescriptions:  .  acetaminophen (TYLENOL) 500 MG tablet, Take 500 mg by mouth as needed. For pain, Disp: , Rfl:  .  amLODipine (NORVASC) 10 MG tablet, TAKE 1 TABLET (10 MG TOTAL) BY MOUTH DAILY., Disp: 90 tablet, Rfl: 3 .  b complex vitamins tablet, Take 1 tablet by mouth daily. , Disp: , Rfl:  .  cloNIDine (CATAPRES) 0.1 MG tablet, Take 1 tablet (0.1 mg total) by mouth 3 (three) times daily., Disp: 270 tablet, Rfl: 3 .  COLCRYS 0.6 MG tablet, Take 0.6 mg by mouth as needed. , Disp: , Rfl:  .  cyclobenzaprine (FLEXERIL) 10 MG tablet, Take 5 mg by mouth at bedtime as needed (Muscle Spasm---stated by patient). , Disp: , Rfl:  .  furosemide (LASIX) 20 MG tablet, Take 20 mg by mouth daily. May take 40 mg if he feels he needs it, Disp: , Rfl:  .  HYDROcodone-acetaminophen (VICODIN) 5-500 MG per tablet, Take 1 tablet  by mouth as needed for pain., Disp: , Rfl:  .  labetalol (NORMODYNE) 200 MG tablet, 2 tabs am 1 tab pm, Disp: 270 tablet, Rfl: 0 .  metoprolol (LOPRESSOR) 100 MG tablet, TAKE 1 TABLET BY MOUTH ONCE DAILY., Disp: 90 tablet, Rfl: 0 .  olmesartan (BENICAR) 40 MG tablet, Take 1 tablet (40 mg total) by mouth daily., Disp: 90 tablet, Rfl: 3 .  pantoprazole (PROTONIX) 40 MG tablet, Take 40 mg by mouth daily. , Disp: , Rfl:  .  predniSONE (DELTASONE) 5 MG tablet, Take 5 mg by mouth. "As needed for gout", Disp: , Rfl:  .  sildenafil (VIAGRA) 50 MG tablet, Take 50 mg by mouth daily as needed. , Disp: , Rfl:  .  sodium bicarbonate 650 MG tablet, Take 650 mg by mouth 2 (two) times daily. PT NOT SURE OF DOSAGE--, Disp: , Rfl:  .  Testosterone 10 MG/ACT (2%) GEL, APPLY 2 PUMPS TO THIGHS AS DIRECTED, Disp: 60 g, Rfl: 2 .  ULORIC 80 MG TABS, Take 80 mg by mouth daily., Disp: , Rfl:  .  VOLTAREN 1 % GEL, Apply 2 g topically as needed. , Disp: , Rfl:  .  amoxicillin (AMOXIL) 500 MG capsule, Before dental appts., Disp: , Rfl:  .  warfarin (COUMADIN) 7.5 MG tablet, Take 1 tablet (7.5 mg total)  by mouth every morning. TAKES 7.5 MG Q. DAY  EXCEPT Wednesday  HE TAKES 11.25 MG., Disp: 90 tablet, Rfl: 3  Allergies:  Allergies  Allergen Reactions  . Ace Inhibitors Anxiety    Diffuse swelling No issues with Benicar Edema Diffuse swelling No issues with Benicar    Past Medical History, Surgical history, Social history, and Family History were reviewed and updated.  Review of Systems: As above  Physical Exam:  height is 5\' 11"  (1.803 m) and weight is 327 lb (148.326 kg). His oral temperature is 97.9 F (36.6 C). His blood pressure is 152/57 and his pulse is 62. His respiration is 20.   Well-developed and well-nourished white cell and. He is somewhat overweight. Head and neck exam shows no ocular or oral lesions. There is no palpable cervical or supraclavicular lymph nodes. Lungs are clear. Cardiac exam shows a  click from his mechanical tricuspid valve. This is a systolic click. He has no murmurs. Abdomen is soft. He is obese. He has good bowel sounds. There is no palpable liver or spleen tip. Back exam shows no tenderness over the spine ribs or hips. Extremities shows no clubbing cyanosis or edema. He may have a little bit of edema in his lower legs. Skin exam no rashes ecchymosis or petechia. Neurological exam is nonfocal.  Lab Results  Component Value Date   WBC 5.2 05/26/2014   HGB 11.7* 05/26/2014   HCT 33.7* 05/26/2014   MCV 84 05/26/2014   PLT 237 05/26/2014     Chemistry      Component Value Date/Time   NA 135 04/27/2014 1030   NA 138 11/18/2013 0920   NA 141 10/16/2013 1033   K 5.0 04/27/2014 1030   K 4.4 11/18/2013 0920   K 4.4 10/16/2013 1033   CL 105 04/27/2014 1030   CL 102 10/16/2013 1033   CO2 21 04/27/2014 1030   CO2 24 11/18/2013 0920   CO2 26 10/16/2013 1033   BUN 49* 04/27/2014 1030   BUN 48.8* 11/18/2013 0920   BUN 55* 10/16/2013 1033   CREATININE 2.19* 04/27/2014 1030   CREATININE 2.3* 11/18/2013 0920   CREATININE 2.4* 10/16/2013 1033      Component Value Date/Time   CALCIUM 9.4 04/27/2014 1030   CALCIUM 9.3 11/18/2013 0920   CALCIUM 9.1 10/16/2013 1033   ALKPHOS 51 11/18/2013 0920   ALKPHOS 39 10/16/2013 1033   ALKPHOS 44 08/28/2013 0837   AST 25 11/18/2013 0920   AST 22 10/16/2013 1033   AST 15 08/28/2013 0837   ALT 34 11/18/2013 0920   ALT 21 10/16/2013 1033   ALT 16 08/28/2013 0837   BILITOT 0.32 11/18/2013 0920   BILITOT 0.80 10/16/2013 1033   BILITOT 0.5 08/28/2013 0837      Impression and Plan: Calvin [name] is 59 year old gentleman with multiple anemia etiologies. His blood counts he is doing quite well right now. Again, he responds well to iron and Aranesp. He does not need any Aranesp today.  We will see what his iron levels are.  His testosterone level is on the low side. This might be causing to have some fatigue.  I will continue to  follow his blood work monthly.-  I will see him back myself in 3 months.   I spent a good half hour with him today. I went over his labs. I reviewed his blood smear.   Volanda Napoleon, MD 1/26/201611:19 AM

## 2014-05-27 ENCOUNTER — Telehealth: Payer: Self-pay | Admitting: *Deleted

## 2014-05-27 NOTE — Telephone Encounter (Signed)
-----   Message from Volanda Napoleon, MD sent at 05/27/2014  7:09 AM EST ----- Call - please let him know the results of BUN/creatinine.  pete

## 2014-06-15 ENCOUNTER — Telehealth: Payer: Self-pay | Admitting: Hematology & Oncology

## 2014-06-15 ENCOUNTER — Ambulatory Visit (HOSPITAL_BASED_OUTPATIENT_CLINIC_OR_DEPARTMENT_OTHER): Payer: BLUE CROSS/BLUE SHIELD

## 2014-06-15 ENCOUNTER — Other Ambulatory Visit (HOSPITAL_BASED_OUTPATIENT_CLINIC_OR_DEPARTMENT_OTHER): Payer: BLUE CROSS/BLUE SHIELD | Admitting: Lab

## 2014-06-15 DIAGNOSIS — D649 Anemia, unspecified: Secondary | ICD-10-CM

## 2014-06-15 DIAGNOSIS — N189 Chronic kidney disease, unspecified: Principal | ICD-10-CM

## 2014-06-15 DIAGNOSIS — D630 Anemia in neoplastic disease: Secondary | ICD-10-CM

## 2014-06-15 DIAGNOSIS — C6291 Malignant neoplasm of right testis, unspecified whether descended or undescended: Secondary | ICD-10-CM

## 2014-06-15 DIAGNOSIS — N289 Disorder of kidney and ureter, unspecified: Secondary | ICD-10-CM

## 2014-06-15 DIAGNOSIS — C61 Malignant neoplasm of prostate: Secondary | ICD-10-CM

## 2014-06-15 DIAGNOSIS — D509 Iron deficiency anemia, unspecified: Secondary | ICD-10-CM

## 2014-06-15 DIAGNOSIS — D631 Anemia in chronic kidney disease: Secondary | ICD-10-CM

## 2014-06-15 DIAGNOSIS — I4819 Other persistent atrial fibrillation: Secondary | ICD-10-CM

## 2014-06-15 LAB — CBC WITH DIFFERENTIAL (CANCER CENTER ONLY)
BASO#: 0 10*3/uL (ref 0.0–0.2)
BASO%: 0.3 % (ref 0.0–2.0)
EOS%: 3.3 % (ref 0.0–7.0)
Eosinophils Absolute: 0.2 10*3/uL (ref 0.0–0.5)
HEMATOCRIT: 27.1 % — AB (ref 38.7–49.9)
HEMOGLOBIN: 9.1 g/dL — AB (ref 13.0–17.1)
LYMPH#: 0.8 10*3/uL — ABNORMAL LOW (ref 0.9–3.3)
LYMPH%: 13.9 % — ABNORMAL LOW (ref 14.0–48.0)
MCH: 28.9 pg (ref 28.0–33.4)
MCHC: 33.6 g/dL (ref 32.0–35.9)
MCV: 86 fL (ref 82–98)
MONO#: 0.5 10*3/uL (ref 0.1–0.9)
MONO%: 8.4 % (ref 0.0–13.0)
NEUT%: 74.1 % (ref 40.0–80.0)
NEUTROS ABS: 4.3 10*3/uL (ref 1.5–6.5)
Platelets: 225 10*3/uL (ref 145–400)
RBC: 3.15 10*6/uL — AB (ref 4.20–5.70)
RDW: 13.2 % (ref 11.1–15.7)
WBC: 5.7 10*3/uL (ref 4.0–10.0)

## 2014-06-15 LAB — BASIC METABOLIC PANEL
BUN: 95 mg/dL — AB (ref 6–23)
CALCIUM: 8.5 mg/dL (ref 8.4–10.5)
CO2: 20 mEq/L (ref 19–32)
Chloride: 101 mEq/L (ref 96–112)
Creatinine, Ser: 5.36 mg/dL — ABNORMAL HIGH (ref 0.50–1.35)
Glucose, Bld: 101 mg/dL — ABNORMAL HIGH (ref 70–99)
Potassium: 5.3 mEq/L (ref 3.5–5.3)
Sodium: 132 mEq/L — ABNORMAL LOW (ref 135–145)

## 2014-06-15 LAB — PROTHROMBIN TIME
INR: 3.04 — AB (ref <1.50)
Prothrombin Time: 31.5 seconds — ABNORMAL HIGH (ref 11.6–15.2)

## 2014-06-15 MED ORDER — DARBEPOETIN ALFA 300 MCG/0.6ML IJ SOSY
PREFILLED_SYRINGE | INTRAMUSCULAR | Status: AC
  Filled 2014-06-15: qty 0.6

## 2014-06-15 MED ORDER — DARBEPOETIN ALFA-POLYSORBATE 200 MCG/0.4ML IJ SOLN: 300.0000 ug | Freq: Once | INTRAMUSCULAR | Status: DC

## 2014-06-15 MED ORDER — DARBEPOETIN ALFA 300 MCG/0.6ML IJ SOSY
300.0000 ug | PREFILLED_SYRINGE | Freq: Once | INTRAMUSCULAR | Status: AC
  Administered 2014-06-15: 300 ug via SUBCUTANEOUS

## 2014-06-15 NOTE — Telephone Encounter (Signed)
Pt called is short of breath scheduled lab and inj for today, transferred to RN for triage.

## 2014-06-15 NOTE — Patient Instructions (Signed)
Darbepoetin Alfa injection What is this medicine? DARBEPOETIN ALFA (dar be POE e tin AL fa) helps your body make more red blood cells. It is used to treat anemia caused by chronic kidney failure and chemotherapy. This medicine may be used for other purposes; ask your health care provider or pharmacist if you have questions. COMMON BRAND NAME(S): Aranesp What should I tell my health care provider before I take this medicine? They need to know if you have any of these conditions: -blood clotting disorders or history of blood clots -cancer patient not on chemotherapy -cystic fibrosis -heart disease, such as angina, heart failure, or a history of a heart attack -hemoglobin level of 12 g/dL or greater -high blood pressure -low levels of folate, iron, or vitamin B12 -seizures -an unusual or allergic reaction to darbepoetin, erythropoietin, albumin, hamster proteins, latex, other medicines, foods, dyes, or preservatives -pregnant or trying to get pregnant -breast-feeding How should I use this medicine? This medicine is for injection into a vein or under the skin. It is usually given by a health care professional in a hospital or clinic setting. If you get this medicine at home, you will be taught how to prepare and give this medicine. Do not shake the solution before you withdraw a dose. Use exactly as directed. Take your medicine at regular intervals. Do not take your medicine more often than directed. It is important that you put your used needles and syringes in a special sharps container. Do not put them in a trash can. If you do not have a sharps container, call your pharmacist or healthcare provider to get one. Talk to your pediatrician regarding the use of this medicine in children. While this medicine may be used in children as young as 1 year for selected conditions, precautions do apply. Overdosage: If you think you have taken too much of this medicine contact a poison control center or  emergency room at once. NOTE: This medicine is only for you. Do not share this medicine with others. What if I miss a dose? If you miss a dose, take it as soon as you can. If it is almost time for your next dose, take only that dose. Do not take double or extra doses. What may interact with this medicine? Do not take this medicine with any of the following medications: -epoetin alfa This list may not describe all possible interactions. Give your health care provider a list of all the medicines, herbs, non-prescription drugs, or dietary supplements you use. Also tell them if you smoke, drink alcohol, or use illegal drugs. Some items may interact with your medicine. What should I watch for while using this medicine? Visit your prescriber or health care professional for regular checks on your progress and for the needed blood tests and blood pressure measurements. It is especially important for the doctor to make sure your hemoglobin level is in the desired range, to limit the risk of potential side effects and to give you the best benefit. Keep all appointments for any recommended tests. Check your blood pressure as directed. Ask your doctor what your blood pressure should be and when you should contact him or her. As your body makes more red blood cells, you may need to take iron, folic acid, or vitamin B supplements. Ask your doctor or health care provider which products are right for you. If you have kidney disease continue dietary restrictions, even though this medication can make you feel better. Talk with your doctor or health   care professional about the foods you eat and the vitamins that you take. What side effects may I notice from receiving this medicine? Side effects that you should report to your doctor or health care professional as soon as possible: -allergic reactions like skin rash, itching or hives, swelling of the face, lips, or tongue -breathing problems -changes in vision -chest  pain -confusion, trouble speaking or understanding -feeling faint or lightheaded, falls -high blood pressure -muscle aches or pains -pain, swelling, warmth in the leg -rapid weight gain -severe headaches -sudden numbness or weakness of the face, arm or leg -trouble walking, dizziness, loss of balance or coordination -seizures (convulsions) -swelling of the ankles, feet, hands -unusually weak or tired Side effects that usually do not require medical attention (report to your doctor or health care professional if they continue or are bothersome): -diarrhea -fever, chills (flu-like symptoms) -headaches -nausea, vomiting -redness, stinging, or swelling at site where injected This list may not describe all possible side effects. Call your doctor for medical advice about side effects. You may report side effects to FDA at 1-800-FDA-1088. Where should I keep my medicine? Keep out of the reach of children. Store in a refrigerator between 2 and 8 degrees C (36 and 46 degrees F). Do not freeze. Do not shake. Throw away any unused portion if using a single-dose vial. Throw away any unused medicine after the expiration date. NOTE: This sheet is a summary. It may not cover all possible information. If you have questions about this medicine, talk to your doctor, pharmacist, or health care provider.  2015, Elsevier/Gold Standard. (2008-03-31 10:23:57)  

## 2014-06-16 ENCOUNTER — Telehealth: Payer: Self-pay | Admitting: *Deleted

## 2014-06-16 NOTE — Telephone Encounter (Signed)
Patient creatinine 5.36  Resulted on 06/15/14. Dr Marin Olp aware. Wants patient notified so he can call his renal doctor for follow up.  Attempted to call patient at home twice on 06/15/14. Message left once. No phone call back.  Attempted to call patient at home twice on 06/16/14. Message left once at 1528. No phone call back.  Patient's wife called me back at 1541 to say that patient had been admitted to [facility] with renal failure and CHF. Told her to call us if they needed anything.

## 2014-06-17 NOTE — Telephone Encounter (Signed)
Spoke with wife

## 2014-06-24 ENCOUNTER — Other Ambulatory Visit: Payer: BLUE CROSS/BLUE SHIELD | Admitting: Lab

## 2014-06-24 ENCOUNTER — Ambulatory Visit: Payer: BLUE CROSS/BLUE SHIELD

## 2014-06-26 ENCOUNTER — Other Ambulatory Visit: Payer: BLUE CROSS/BLUE SHIELD | Admitting: Lab

## 2014-06-26 ENCOUNTER — Ambulatory Visit: Payer: BLUE CROSS/BLUE SHIELD

## 2014-07-02 ENCOUNTER — Other Ambulatory Visit: Payer: Self-pay

## 2014-07-02 DIAGNOSIS — I4891 Unspecified atrial fibrillation: Secondary | ICD-10-CM

## 2014-07-02 MED ORDER — CLONIDINE HCL 0.1 MG PO TABS: 0.1000 mg | ORAL_TABLET | Freq: Three times a day (TID) | ORAL | Status: DC

## 2014-07-02 MED ORDER — METOPROLOL TARTRATE 100 MG PO TABS: ORAL_TABLET | ORAL | Status: DC

## 2014-07-02 MED ORDER — LABETALOL HCL 200 MG PO TABS: ORAL_TABLET | ORAL | Status: DC

## 2014-07-22 ENCOUNTER — Ambulatory Visit: Payer: BLUE CROSS/BLUE SHIELD

## 2014-07-22 ENCOUNTER — Other Ambulatory Visit: Payer: BLUE CROSS/BLUE SHIELD | Admitting: Lab

## 2014-07-26 NOTE — Progress Notes (Signed)
HPI: FU paroxysmal atrial fibrillation/flutter, s/p aortic valve replacement, aortic coarctation, aortic aneurysm, coronary artery disease on CT scan and CKD. Patient is s/p coarctation repair in 1958 and redo in 1971. Had endovascular stent in 1995 and 1996. Had aortic balloon valvuloplasty in 1974 and AVR in 1994. 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 Stent in distal arch, proximal descending aorta patent. Mildly enlarged lymph nodes. He is seen at the [facility] clinic on a yearly basis for followup of above. Echocardiogram in December of 2014 showed normal LV function, mechanical aortic valve, moderate left atrial enlargement and stable gradient across presumed coarctation repair. Patient last seen in [facility] in 10/2013. Echo 7/15 showed normal LV function, biatrial enlargement, AVR with mean gradient of 18 mmHg, dilated aortic root (unchanged); coarctation with peak gradient of 60 mmHg. Plan is to obtain Chest CT without contrast next year to survey his thoracic aorta.   In early January and the patient noticed increasing dyspnea on exertion and weight gain. He went into atrial fibrillation in late January. He apparently was admitted to [facility] with elevated creatinine, anemia and heart failure. He was transfused and diuresed with significant improvement. His creatinine also improved. Since discharge he still has some dyspnea on exertion and fatigue related to atrial fibrillation. He denies chest pain.  Had repeat DCCV for atrial fibrillation 8/15. Also has renal insuff followed by nephrology. Since last seen,    Current Outpatient Prescriptions  Medication Sig Dispense Refill  . acetaminophen (TYLENOL) 500 MG tablet Take 500 mg by mouth as needed. For pain    . amLODipine (NORVASC) 10 MG tablet TAKE 1 TABLET (10 MG TOTAL) BY MOUTH DAILY. 90 tablet 3  . amoxicillin (AMOXIL) 500 MG capsule Before dental appts.    Marland Kitchen b complex  vitamins tablet Take 1 tablet by mouth daily.     . cloNIDine (CATAPRES) 0.1 MG tablet Take 1 tablet (0.1 mg total) by mouth 3 (three) times daily. 270 tablet 0  . COLCRYS 0.6 MG tablet Take 0.6 mg by mouth as needed.     . cyclobenzaprine (FLEXERIL) 10 MG tablet Take 5 mg by mouth at bedtime as needed (Muscle Spasm---stated by patient).     . furosemide (LASIX) 20 MG tablet Take 20 mg by mouth daily.     . furosemide (LASIX) 40 MG tablet Take 40 mg by mouth every morning.    Marland Kitchen HYDROcodone-acetaminophen (NORCO/VICODIN) 5-325 MG per tablet Take 1 tablet by mouth as needed.  0  . labetalol (NORMODYNE) 200 MG tablet 2 tabs am 1 tab pm 270 tablet 1  . olmesartan (BENICAR) 40 MG tablet Take 20 mg by mouth daily.    . pantoprazole (PROTONIX) 40 MG tablet Take 40 mg by mouth daily.     . predniSONE (DELTASONE) 5 MG tablet Take 5 mg by mouth. "As needed for gout"    . sildenafil (VIAGRA) 50 MG tablet Take 50 mg by mouth daily as needed.     . sodium bicarbonate 650 MG tablet Take 650 mg by mouth 2 (two) times daily. PT NOT SURE OF DOSAGE--    . Testosterone 10 MG/ACT (2%) GEL APPLY 2 PUMPS TO THIGHS AS DIRECTED 60 g 2  . ULORIC 80 MG TABS Take 80 mg by mouth daily.    Marland Kitchen warfarin (COUMADIN) 7.5 MG tablet Take 7.5 mg by mouth daily. Per Dr Valora Piccolo    . metoprolol (LOPRESSOR) 100 MG tablet  TAKE 1 TABLET BY MOUTH ONCE DAILY. (Patient not taking: Reported on 07/28/2014) 90 tablet 0   No current facility-administered medications for this visit.     Past Medical History  Diagnosis Date  . 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 HTN (hypertension)   . CAD (coronary artery disease)   . Aortic aneurysm   . Gout   . Atrial fibrillation   . Anemia   . Hyperlipidemia   . Atrial flutter   . Obesity   . Anemia of renal disease 10/04/2011  . CKD (chronic kidney disease)     baseline creatinine is 1.5-2  . Gastric polyp 2007, 06/2012    benign.  . Colonic ulcer 2007    felt to be secondary to NSAID    . Testicular adenoma   . PONV (postoperative nausea and vomiting)     Past Surgical History  Procedure Laterality Date  . Coarctation of aorta repair  1958  . Coarctation of aorta repair  1970  . Aortic valve replacement and mitral valve repair  1974    Bicuspid aortic valve  . Orchiectomy  1992    testicular cancer  . Aortic valve replacement  1995    [facility]  . Tonsillectomy    . Cardioversion  02/27/2012    Procedure: CARDIOVERSION;  Surgeon: Lelon Perla, MD;  Location: [facility] ENDOSCOPY;  Service: Cardiovascular;  Laterality: N/A;  . Esophagogastroduodenoscopy N/A 07/04/2012    Procedure: ESOPHAGOGASTRODUODENOSCOPY (EGD);  Surgeon: Inda Castle, MD;  Location: [place];  Service: Endoscopy;  Laterality: N/A;  . Cardioversion N/A 05/29/2013    Procedure: CARDIOVERSION;  Surgeon: Lelon Perla, MD;  Location: [facility] ENDOSCOPY;  Service: Cardiovascular;  Laterality: N/A;  . Cardioversion N/A 12/15/2013    Procedure: CARDIOVERSION;  Surgeon: Sueanne Margarita, MD;  Location: [facility] ENDOSCOPY;  Service: Cardiovascular;  Laterality: N/A;    History   Social History  . Marital Status: Married    Spouse Name: N/A  . Number of Children: 4  . Years of Education: N/A   Occupational History  . Not on file.   Social History Main Topics  . Smoking status: Former Smoker -- 0.25 packs/day for 7 years    Types: Cigarettes    Start date: 05/28/1975    Quit date: 05/27/1981  . Smokeless tobacco: Never Used     Comment: quit 25 years ago  . Alcohol Use: Yes     Comment: wine, occasionally  . Drug Use: No  . Sexual Activity: Not on file   Other Topics Concern  . Not on file   Social History Narrative   Pt lives in [place] with wife.  3 children ages 71,20,22 (all 3 are adopted)   Sales grinding wheels.    ROS: no fevers or chills, productive cough, hemoptysis, dysphasia, odynophagia, melena, hematochezia, dysuria, hematuria, rash, seizure activity, orthopnea, PND, pedal  edema, claudication. Remaining systems are negative.  Physical Exam: Well-developed well-nourished in no acute distress.  Skin is warm and dry.  HEENT is normal.  Neck is supple.  Chest is clear to auscultation with normal expansion.  Cardiovascular exam is irregular, 2/6 systolic murmur left sternal border Abdominal exam nontender or distended. No masses palpated. Extremities show no edema. neuro grossly intact  ECG atrial fibrillation at a rate of 63. Normal axis. Occasional PVC. Nonspecific ST changes.

## 2014-07-28 ENCOUNTER — Ambulatory Visit (INDEPENDENT_AMBULATORY_CARE_PROVIDER_SITE_OTHER): Payer: BLUE CROSS/BLUE SHIELD | Admitting: Cardiology

## 2014-07-28 ENCOUNTER — Encounter: Payer: Self-pay | Admitting: Cardiology

## 2014-07-28 VITALS — BP 132/80 | HR 80 | Ht 74.0 in | Wt 320.5 lb

## 2014-07-28 DIAGNOSIS — I480 Paroxysmal atrial fibrillation: Secondary | ICD-10-CM

## 2014-07-28 DIAGNOSIS — I10 Essential (primary) hypertension: Secondary | ICD-10-CM | POA: Diagnosis not present

## 2014-07-28 DIAGNOSIS — Q251 Coarctation of aorta: Secondary | ICD-10-CM

## 2014-07-28 DIAGNOSIS — K2970 Gastritis, unspecified, without bleeding: Secondary | ICD-10-CM

## 2014-07-28 DIAGNOSIS — K2990 Gastroduodenitis, unspecified, without bleeding: Secondary | ICD-10-CM

## 2014-07-28 DIAGNOSIS — N183 Chronic kidney disease, stage 3 unspecified: Secondary | ICD-10-CM

## 2014-07-28 DIAGNOSIS — Z954 Presence of other heart-valve replacement: Secondary | ICD-10-CM

## 2014-07-28 DIAGNOSIS — Z952 Presence of prosthetic heart valve: Secondary | ICD-10-CM

## 2014-07-28 NOTE — Assessment & Plan Note (Signed)
Patient has developed recurrent atrial fibrillation which is symptomatic. We will obtain his most recent INRs. If therapeutic for greater than 3 weeks we will proceed with cardioversion. Continue Coumadin. I discussed antiarrhythmic therapy with the possibility of adding amiodarone. However he would like to continue off of this medication at this point but will consider in the future if his episodes become more frequent. Continue beta blocker.

## 2014-07-28 NOTE — Patient Instructions (Signed)
Your physician recommends that you schedule a follow-up appointment in: [place]

## 2014-07-28 NOTE — Assessment & Plan Note (Signed)
noncontrast chest CT in July at [facility].

## 2014-07-28 NOTE — Assessment & Plan Note (Signed)
This and his thoracic aortic aneurysm followed at the [facility] clinic. Noncontrast chest CT is scheduled for July.

## 2014-07-28 NOTE — Assessment & Plan Note (Signed)
Followed by nephrology. 

## 2014-07-28 NOTE — Assessment & Plan Note (Signed)
Continue present blood pressure medications. 

## 2014-07-28 NOTE — Assessment & Plan Note (Signed)
Continue SBE prophylaxis. 

## 2014-07-29 NOTE — Addendum Note (Signed)
Addended by: Diana Eves on: 07/29/2014 05:20 PM   Modules accepted: Orders

## 2014-07-30 ENCOUNTER — Ambulatory Visit: Payer: BLUE CROSS/BLUE SHIELD

## 2014-07-30 ENCOUNTER — Other Ambulatory Visit (HOSPITAL_BASED_OUTPATIENT_CLINIC_OR_DEPARTMENT_OTHER): Payer: BLUE CROSS/BLUE SHIELD

## 2014-07-30 DIAGNOSIS — D509 Iron deficiency anemia, unspecified: Secondary | ICD-10-CM | POA: Diagnosis not present

## 2014-07-30 DIAGNOSIS — I4819 Other persistent atrial fibrillation: Secondary | ICD-10-CM

## 2014-07-30 DIAGNOSIS — N289 Disorder of kidney and ureter, unspecified: Secondary | ICD-10-CM

## 2014-07-30 DIAGNOSIS — C6291 Malignant neoplasm of right testis, unspecified whether descended or undescended: Secondary | ICD-10-CM

## 2014-07-30 DIAGNOSIS — D649 Anemia, unspecified: Secondary | ICD-10-CM | POA: Diagnosis not present

## 2014-07-30 LAB — CBC WITH DIFFERENTIAL (CANCER CENTER ONLY)
BASO#: 0 10*3/uL (ref 0.0–0.2)
BASO%: 0.6 % (ref 0.0–2.0)
EOS ABS: 0.2 10*3/uL (ref 0.0–0.5)
EOS%: 4.2 % (ref 0.0–7.0)
HCT: 33.7 % — ABNORMAL LOW (ref 38.7–49.9)
HGB: 11.3 g/dL — ABNORMAL LOW (ref 13.0–17.1)
LYMPH#: 1.1 10*3/uL (ref 0.9–3.3)
LYMPH%: 20.1 % (ref 14.0–48.0)
MCH: 27.8 pg — ABNORMAL LOW (ref 28.0–33.4)
MCHC: 33.5 g/dL (ref 32.0–35.9)
MCV: 83 fL (ref 82–98)
MONO#: 0.4 10*3/uL (ref 0.1–0.9)
MONO%: 7.9 % (ref 0.0–13.0)
NEUT%: 67.2 % (ref 40.0–80.0)
NEUTROS ABS: 3.5 10*3/uL (ref 1.5–6.5)
PLATELETS: 235 10*3/uL (ref 145–400)
RBC: 4.06 10*6/uL — ABNORMAL LOW (ref 4.20–5.70)
RDW: 14 % (ref 11.1–15.7)
WBC: 5.2 10*3/uL (ref 4.0–10.0)

## 2014-07-30 LAB — BASIC METABOLIC PANEL
BUN: 74 mg/dL — ABNORMAL HIGH (ref 6–23)
CALCIUM: 9.4 mg/dL (ref 8.4–10.5)
CHLORIDE: 104 meq/L (ref 96–112)
CO2: 22 mEq/L (ref 19–32)
CREATININE: 3.21 mg/dL — AB (ref 0.50–1.35)
Glucose, Bld: 81 mg/dL (ref 70–99)
Potassium: 4.4 mEq/L (ref 3.5–5.3)
Sodium: 137 mEq/L (ref 135–145)

## 2014-08-04 LAB — PROTIME-INR

## 2014-08-05 IMAGING — US US ABDOMEN COMPLETE
1 series · 14 of 25 positions shown · non-contrast
Comparison: None.

CLINICAL DATA: Stage III chronic renal failure. Acute left flank
pain. Testicular cancer

EXAM:
ULTRASOUND ABDOMEN COMPLETE

[Series 1: us abdomen complete · 0.39mm/px · 14 of 63 slices shown]
[im 1/63]
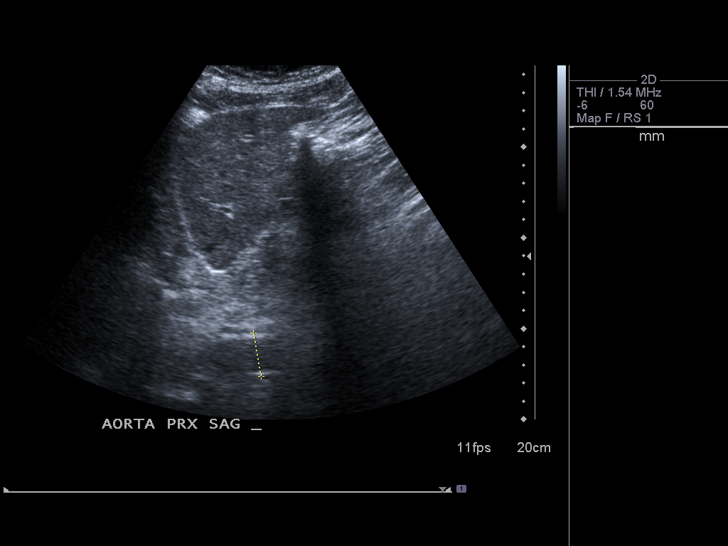
[im 6/63]
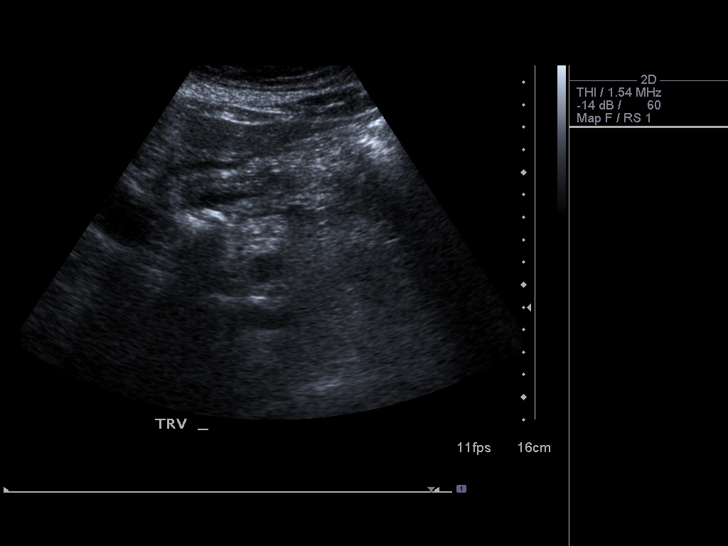
[im 11/63]
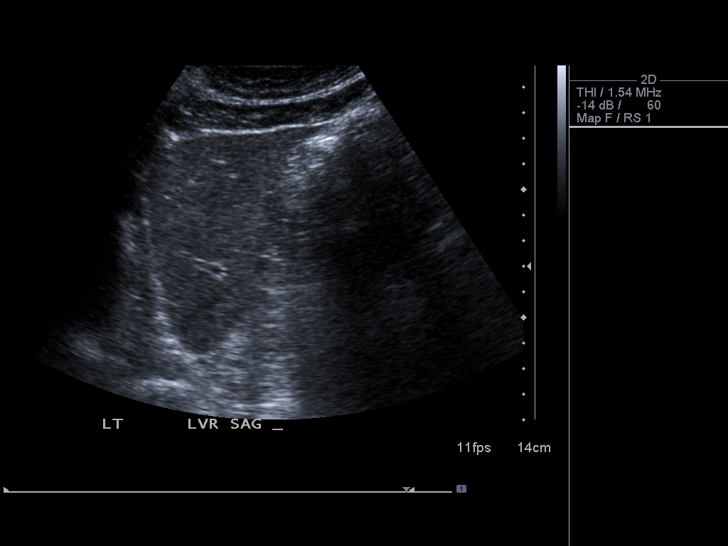
[im 16/63]
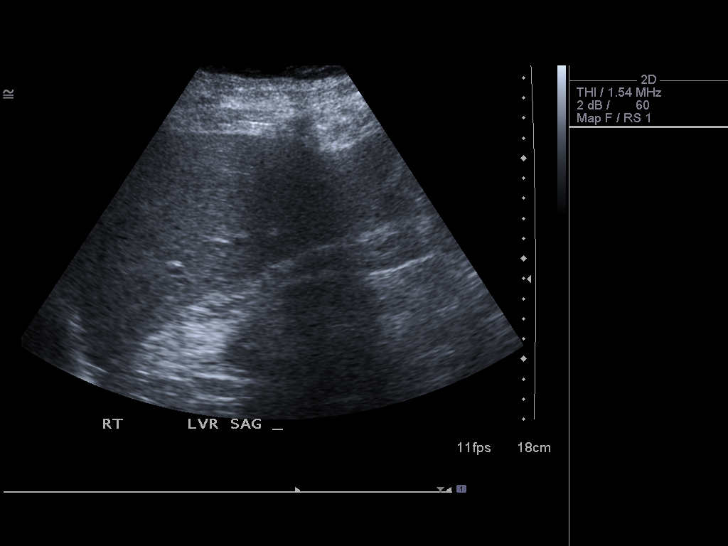
[im 21/63]
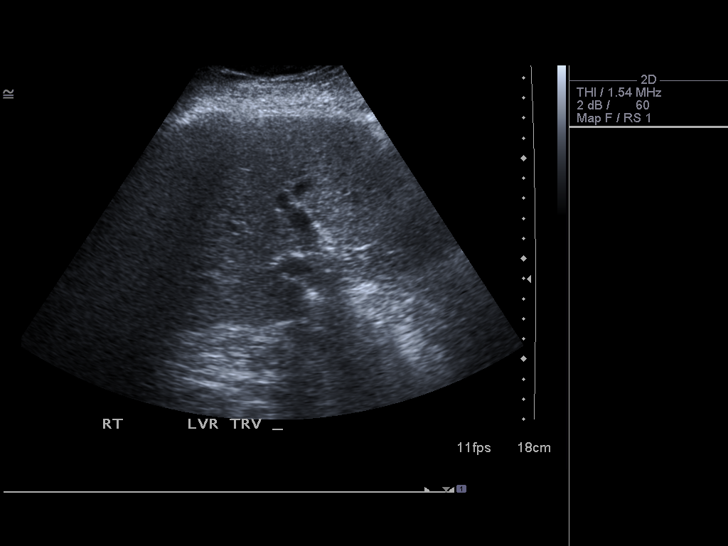
[im 24/63]
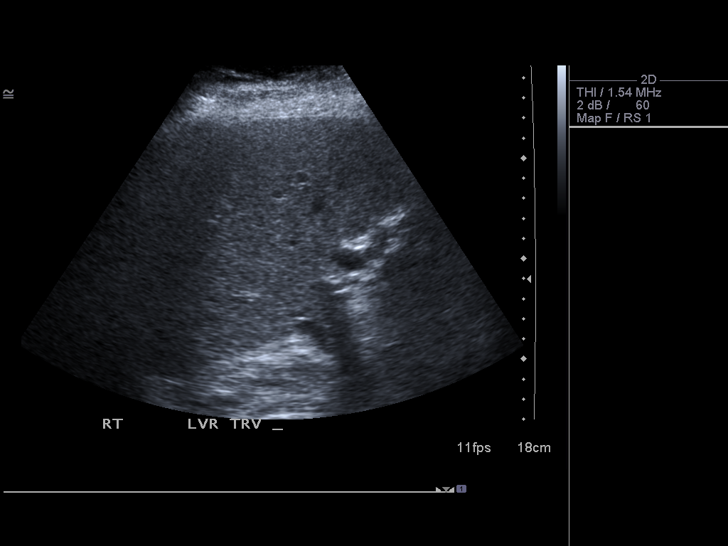
[im 29/63]
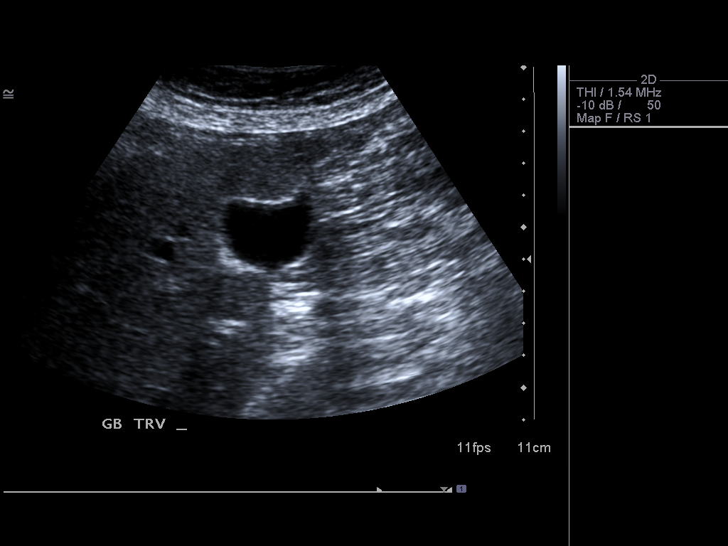
[im 34/63]
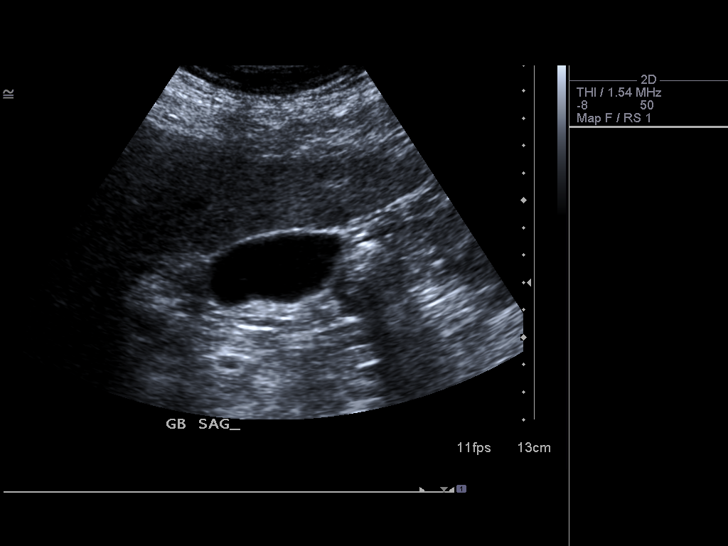
[im 39/63]
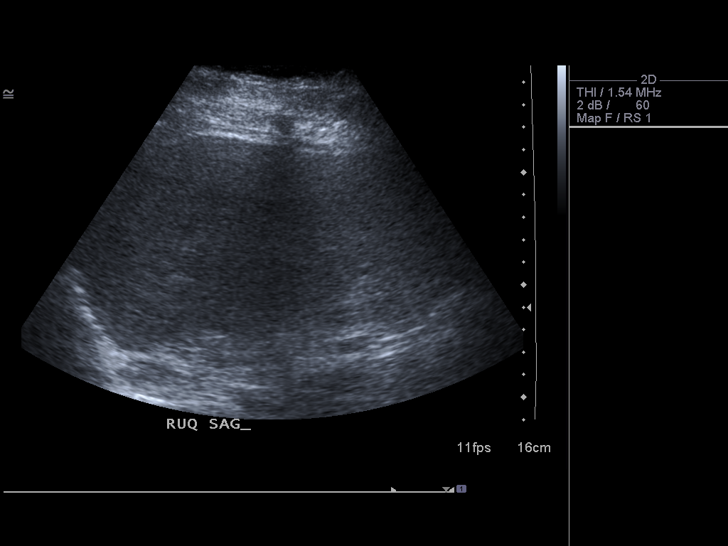
[im 42/63]
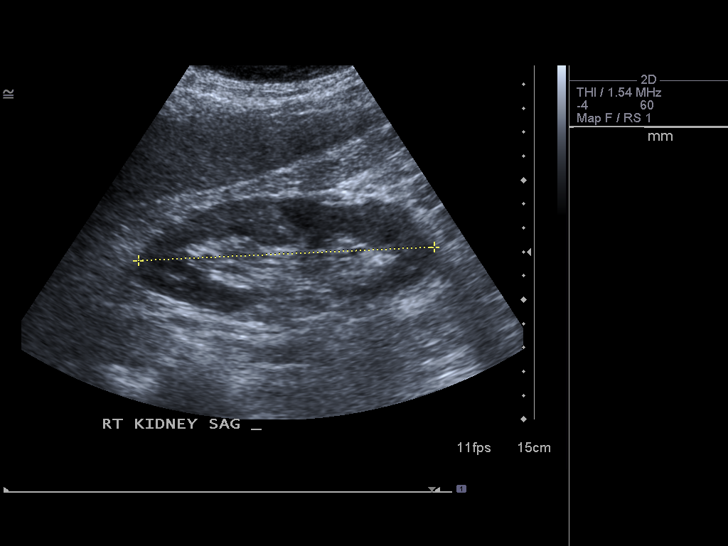
[im 47/63]
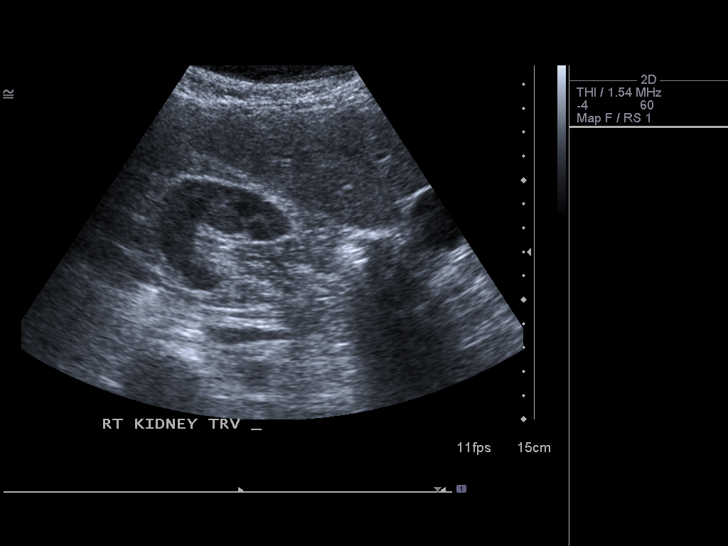
[im 52/63]
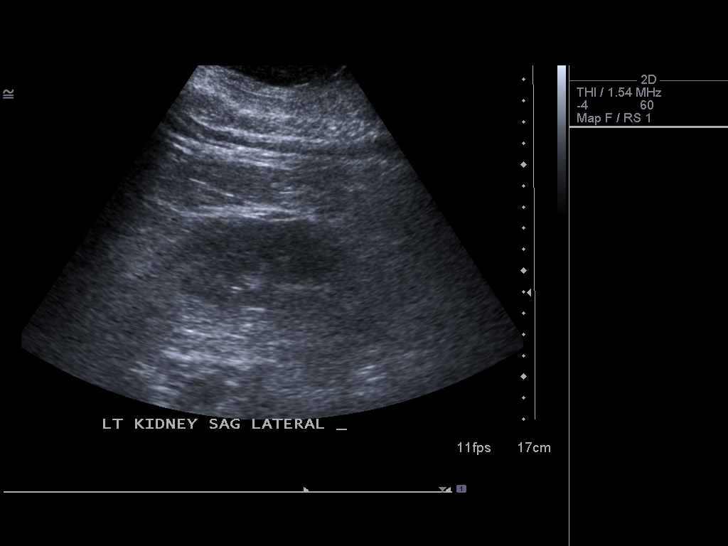
[im 57/63]
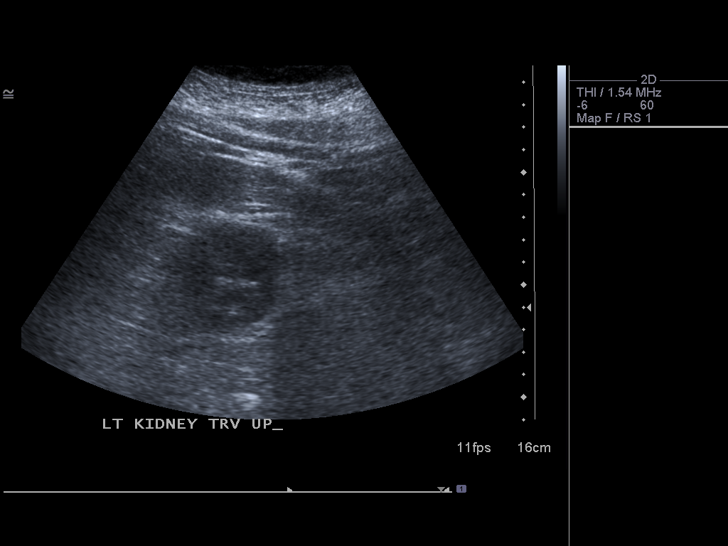
[im 63/63]
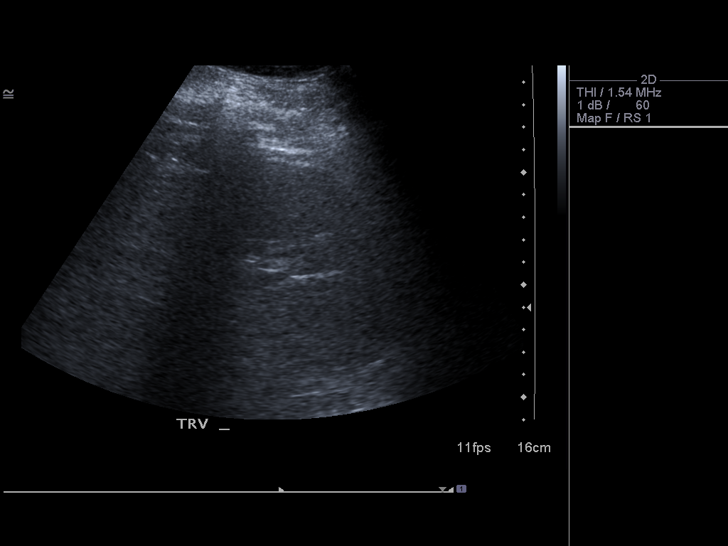

[14 of 25 positions shown; findings below may reference images not displayed]

FINDINGS: Gallbladder

No gallstones or wall thickening. Negative sonographic Murphy's
sign.

Common bile duct

Diameter: Nondilated common bile duct. Limited visualization of the
bile duct

Liver

No focal lesion identified. Within normal limits in parenchymal
echogenicity.

IVC

Limited

Pancreas

Limited

Spleen

Size and appearance within normal limits.

Right Kidney

Length: 12.4 cm Echogenicity within normal limits. No mass or
hydronephrosis visualized.

Left Kidney

Length: 11.8 cm Echogenicity within normal limits. No mass or
hydronephrosis visualized.

Abdominal aorta

No aneurysm visualized.
IMPRESSION: Negative for gallstones.

No significant renal abnormality.

## 2014-08-10 ENCOUNTER — Telehealth: Payer: Self-pay | Admitting: Cardiology

## 2014-08-10 NOTE — Telephone Encounter (Signed)
Error

## 2014-08-19 ENCOUNTER — Other Ambulatory Visit: Payer: BLUE CROSS/BLUE SHIELD | Admitting: Lab

## 2014-08-19 ENCOUNTER — Ambulatory Visit: Payer: BLUE CROSS/BLUE SHIELD | Admitting: Hematology & Oncology

## 2014-08-19 ENCOUNTER — Ambulatory Visit: Payer: BLUE CROSS/BLUE SHIELD

## 2014-08-20 ENCOUNTER — Encounter: Payer: Self-pay | Admitting: *Deleted

## 2014-08-21 ENCOUNTER — Other Ambulatory Visit: Payer: Self-pay | Admitting: Cardiology

## 2014-08-24 ENCOUNTER — Encounter: Payer: Self-pay | Admitting: *Deleted

## 2014-08-25 ENCOUNTER — Encounter (HOSPITAL_COMMUNITY)
Admission: RE | Disposition: A | Discharge status: Home / Self Care | Payer: BLUE CROSS/BLUE SHIELD | Source: Ambulatory Visit | Attending: Cardiology

## 2014-08-25 ENCOUNTER — Ambulatory Visit (HOSPITAL_COMMUNITY): Payer: BLUE CROSS/BLUE SHIELD | Admitting: Anesthesiology

## 2014-08-25 ENCOUNTER — Ambulatory Visit (HOSPITAL_COMMUNITY)
Admission: RE | Admit: 2014-08-25 | Discharge: 2014-08-25 | Disposition: A | Discharge status: Home / Self Care | Payer: BLUE CROSS/BLUE SHIELD | Source: Ambulatory Visit | Attending: Cardiology | Admitting: Cardiology

## 2014-08-25 ENCOUNTER — Encounter (HOSPITAL_COMMUNITY): Payer: Self-pay | Admitting: *Deleted

## 2014-08-25 DIAGNOSIS — N189 Chronic kidney disease, unspecified: Secondary | ICD-10-CM | POA: Insufficient documentation

## 2014-08-25 DIAGNOSIS — I480 Paroxysmal atrial fibrillation: Secondary | ICD-10-CM | POA: Insufficient documentation

## 2014-08-25 DIAGNOSIS — I2510 Atherosclerotic heart disease of native coronary artery without angina pectoris: Secondary | ICD-10-CM | POA: Diagnosis not present

## 2014-08-25 DIAGNOSIS — Z7901 Long term (current) use of anticoagulants: Secondary | ICD-10-CM | POA: Insufficient documentation

## 2014-08-25 DIAGNOSIS — I739 Peripheral vascular disease, unspecified: Secondary | ICD-10-CM | POA: Insufficient documentation

## 2014-08-25 DIAGNOSIS — I129 Hypertensive chronic kidney disease with stage 1 through stage 4 chronic kidney disease, or unspecified chronic kidney disease: Secondary | ICD-10-CM | POA: Insufficient documentation

## 2014-08-25 DIAGNOSIS — I4892 Unspecified atrial flutter: Secondary | ICD-10-CM | POA: Diagnosis not present

## 2014-08-25 DIAGNOSIS — Z6841 Body Mass Index (BMI) 40.0 and over, adult: Secondary | ICD-10-CM | POA: Diagnosis not present

## 2014-08-25 DIAGNOSIS — I719 Aortic aneurysm of unspecified site, without rupture: Secondary | ICD-10-CM | POA: Insufficient documentation

## 2014-08-25 DIAGNOSIS — Z87891 Personal history of nicotine dependence: Secondary | ICD-10-CM | POA: Diagnosis not present

## 2014-08-25 DIAGNOSIS — M109 Gout, unspecified: Secondary | ICD-10-CM | POA: Insufficient documentation

## 2014-08-25 HISTORY — PX: CARDIOVERSION: SHX1299

## 2014-08-25 SURGERY — CARDIOVERSION
Anesthesia: Monitor Anesthesia Care

## 2014-08-25 MED ORDER — LIDOCAINE HCL (CARDIAC) 20 MG/ML IV SOLN
INTRAVENOUS | Status: DC | PRN
  Administered 2014-08-25: 40 mg via INTRAVENOUS

## 2014-08-25 MED ORDER — PROPOFOL 10 MG/ML IV BOLUS
INTRAVENOUS | Status: DC | PRN
  Administered 2014-08-25: 30 mg via INTRAVENOUS
  Administered 2014-08-25: 40 mg via INTRAVENOUS
  Administered 2014-08-25: 30 mg via INTRAVENOUS
  Administered 2014-08-25: 40 mg via INTRAVENOUS
  Administered 2014-08-25: 20 mg via INTRAVENOUS

## 2014-08-25 NOTE — Procedures (Signed)
Electrical Cardioversion Procedure Note SAMIE SEVER [id number] [birth year]  Procedure: Electrical Cardioversion Indications:  Atrial Fibrillation  Procedure Details Consent: Risks of procedure as well as the alternatives and risks of each were explained to the (patient/caregiver).  Consent for procedure obtained. Time Out: Verified patient identification, verified procedure, site/side was marked, verified correct patient position, special equipment/implants available, medications/allergies/relevent history reviewed, required imaging and test results available.  Performed  Patient placed on cardiac monitor, pulse oximetry, supplemental oxygen as necessary.  Sedation given: Patient sedated by anesthesia with diprovan 160 mg IV. Pacer pads placed anterior and posterior chest.  Cardioverted 2 time(s).  Cardioverted at 120J and then 150J.  Evaluation Findings: Post procedure EKG shows: NSR Complications: None Patient did tolerate procedure well.   Kirk Ruths 08/25/2014, 12:58 PM

## 2014-08-25 NOTE — Transfer of Care (Signed)
Immediate Anesthesia Transfer of Care Note  Patient: Calvin [name]  Procedure(s) Performed: Procedure(s): CARDIOVERSION (N/A)  Patient Location: PACU and Endoscopy Unit  Anesthesia Type:MAC  Level of Consciousness: awake, alert  and oriented  Airway & Oxygen Therapy: Patient Spontanous Breathing and Patient connected to nasal cannula oxygen  Post-op Assessment: Report given to RN and Post -op Vital signs reviewed and stable  Post vital signs: Reviewed and stable  Last Vitals:  Filed Vitals:   08/25/14 1343  BP: 135/43  Pulse: 55  Temp:   Resp: 17    Complications: No apparent anesthesia complications

## 2014-08-25 NOTE — H&P (Signed)
[place]  07/28/2014 12:15 PM  Office Visit  MRN:  [id number]   Description: Male DOB: 02-02-1956  Provider: Lelon Perla, MD  Department: [facility]       Vital Signs  Most recent update: 07/28/2014 12:25 PM by Dionne Bucy Truitt, CMA    BP Pulse Ht Wt BMI    132/80 mmHg 80 6\' 2"  (1.88 m) 320 lb 8 oz (145.378 kg) 41.13 kg/m2      Progress Notes      Lelon Perla, MD at 07/26/2014 3:42 PM     Status: Signed       Expand All Collapse All        HPI: FU paroxysmal atrial fibrillation/flutter, s/p aortic valve replacement, aortic coarctation, aortic aneurysm, coronary artery disease on CT scan and CKD. Patient is s/p coarctation repair in 1958 and redo in 1971. Had endovascular stent in 1995 and 1996. Had aortic balloon valvuloplasty in 1974 and AVR in 1994. 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 Stent in distal arch, proximal descending aorta patent. Mildly enlarged lymph nodes. He is seen at the [facility] clinic on a yearly basis for followup of above. Echocardiogram in December of 2014 showed normal LV function, mechanical aortic valve, moderate left atrial enlargement and stable gradient across presumed coarctation repair. Patient last seen in [facility] in 10/2013. Echo 7/15 showed normal LV function, biatrial enlargement, AVR with mean gradient of 18 mmHg, dilated aortic root (unchanged); coarctation with peak gradient of 60 mmHg. Plan is to obtain Chest CT without contrast next year to survey his thoracic aorta.   In early January and the patient noticed increasing dyspnea on exertion and weight gain. He went into atrial fibrillation in late January. He apparently was admitted to [facility] with elevated creatinine, anemia and heart failure. He was transfused and diuresed with significant improvement. His creatinine also improved. Since discharge he still has some dyspnea on exertion and fatigue related to atrial  fibrillation. He denies chest pain.  Had repeat DCCV for atrial fibrillation 8/15. Also has renal insuff followed by nephrology. Since last seen,    Current Outpatient Prescriptions  Medication Sig Dispense Refill  . acetaminophen (TYLENOL) 500 MG tablet Take 500 mg by mouth as needed. For pain    . amLODipine (NORVASC) 10 MG tablet TAKE 1 TABLET (10 MG TOTAL) BY MOUTH DAILY. 90 tablet 3  . amoxicillin (AMOXIL) 500 MG capsule Before dental appts.    Marland Kitchen b complex vitamins tablet Take 1 tablet by mouth daily.     . cloNIDine (CATAPRES) 0.1 MG tablet Take 1 tablet (0.1 mg total) by mouth 3 (three) times daily. 270 tablet 0  . COLCRYS 0.6 MG tablet Take 0.6 mg by mouth as needed.     . cyclobenzaprine (FLEXERIL) 10 MG tablet Take 5 mg by mouth at bedtime as needed (Muscle Spasm---stated by patient).     . furosemide (LASIX) 20 MG tablet Take 20 mg by mouth daily.     . furosemide (LASIX) 40 MG tablet Take 40 mg by mouth every morning.    Marland Kitchen HYDROcodone-acetaminophen (NORCO/VICODIN) 5-325 MG per tablet Take 1 tablet by mouth as needed.  0  . labetalol (NORMODYNE) 200 MG tablet 2 tabs am 1 tab pm 270 tablet 1  . olmesartan (BENICAR) 40 MG tablet Take 20 mg by mouth daily.    . pantoprazole (PROTONIX) 40 MG tablet Take 40 mg by mouth daily.     . predniSONE (  DELTASONE) 5 MG tablet Take 5 mg by mouth. "As needed for gout"    . sildenafil (VIAGRA) 50 MG tablet Take 50 mg by mouth daily as needed.     . sodium bicarbonate 650 MG tablet Take 650 mg by mouth 2 (two) times daily. PT NOT SURE OF DOSAGE--    . Testosterone 10 MG/ACT (2%) GEL APPLY 2 PUMPS TO THIGHS AS DIRECTED 60 g 2  . ULORIC 80 MG TABS Take 80 mg by mouth daily.    Marland Kitchen warfarin (COUMADIN) 7.5 MG tablet Take 7.5 mg by mouth daily. Per Dr Valora Piccolo    . metoprolol (LOPRESSOR) 100 MG tablet TAKE 1 TABLET BY MOUTH ONCE DAILY. (Patient not taking:  Reported on 07/28/2014) 90 tablet 0   No current facility-administered medications for this visit.     Past Medical History  Diagnosis Date  . 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 HTN (hypertension)   . CAD (coronary artery disease)   . Aortic aneurysm   . Gout   . Atrial fibrillation   . Anemia   . Hyperlipidemia   . Atrial flutter   . Obesity   . Anemia of renal disease 10/04/2011  . CKD (chronic kidney disease)     baseline creatinine is 1.5-2  . Gastric polyp 2007, 06/2012    benign.  . Colonic ulcer 2007    felt to be secondary to NSAID  . Testicular adenoma   . PONV (postoperative nausea and vomiting)     Past Surgical History  Procedure Laterality Date  . Coarctation of aorta repair  1958  . Coarctation of aorta repair  1970  . Aortic valve replacement and mitral valve repair  1974    Bicuspid aortic valve  . Orchiectomy  1992    testicular cancer  . Aortic valve replacement  1995    [facility]  . Tonsillectomy    . Cardioversion  02/27/2012    Procedure: CARDIOVERSION; Surgeon: Lelon Perla, MD; Location: [facility] ENDOSCOPY; Service: Cardiovascular; Laterality: N/A;  . Esophagogastroduodenoscopy N/A 07/04/2012    Procedure: ESOPHAGOGASTRODUODENOSCOPY (EGD); Surgeon: Inda Castle, MD; Location: [place]; Service: Endoscopy; Laterality: N/A;  . Cardioversion N/A 05/29/2013    Procedure: CARDIOVERSION; Surgeon: Lelon Perla, MD; Location: [facility] ENDOSCOPY; Service: Cardiovascular; Laterality: N/A;  . Cardioversion N/A 12/15/2013    Procedure: CARDIOVERSION; Surgeon: Sueanne Margarita, MD; Location: [facility] ENDOSCOPY; Service: Cardiovascular; Laterality: N/A;    History   Social History  . Marital Status: Married    Spouse Name: N/A  . Number of Children: 4  . Years of Education: N/A    Occupational History  . Not on file.   Social History Main Topics  . Smoking status: Former Smoker -- 0.25 packs/day for 7 years    Types: Cigarettes    Start date: 05/28/1975    Quit date: 05/27/1981  . Smokeless tobacco: Never Used     Comment: quit 25 years ago  . Alcohol Use: Yes     Comment: wine, occasionally  . Drug Use: No  . Sexual Activity: Not on file   Other Topics Concern  . Not on file   Social History Narrative   Pt lives in [place] with wife. 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    ROS: no fevers or chills, productive cough, hemoptysis, dysphasia, odynophagia, melena, hematochezia, dysuria, hematuria, rash, seizure activity, orthopnea, PND, pedal edema, claudication. Remaining systems  are negative.  Physical Exam: Well-developed well-nourished in no acute distress.  Skin is warm and dry.  HEENT is normal.  Neck is supple.  Chest is clear to auscultation with normal expansion.  Cardiovascular exam is irregular, 2/6 systolic murmur left sternal border Abdominal exam nontender or distended. No masses palpated. Extremities show no edema. neuro grossly intact  ECG atrial fibrillation at a rate of 63. Normal axis. Occasional PVC. Nonspecific ST changes.                Atrial fibrillation - Lelon Perla, MD at 07/28/2014 1:28 PM     Status: Written Related Problem: Atrial fibrillation   Expand All Collapse All   Patient has developed recurrent atrial fibrillation which is symptomatic. We will obtain his most recent INRs. If therapeutic for greater than 3 weeks we will proceed with cardioversion. Continue Coumadin. I discussed antiarrhythmic therapy with the possibility of adding amiodarone. However he would like to continue off of this medication at this point but will consider in the future if his episodes become more frequent. Continue beta  blocker.            CKD (chronic kidney disease) stage 3, GFR 30-59 ml/min - Lelon Perla, MD at 07/28/2014 1:29 PM     Status: Written Related Problem: CKD (chronic kidney disease) stage 3, GFR 30-59 ml/min   Expand All Collapse All   Followed by nephrology.            Coarctation of aorta (previous repair and stent) - Lelon Perla, MD at 07/28/2014 1:29 PM     Status: Written Related Problem: Coarctation of aorta (previous repair and stent)   Expand All Collapse All   This and his thoracic aortic aneurysm followed at the [facility] clinic. Noncontrast chest CT is scheduled for July.            Hypertension - Lelon Perla, MD at 07/28/2014 1:30 PM     Status: Written Related Problem: Hypertension   Expand All Collapse All   Continue present blood pressure medications.            S/P aortic valve replacement - Lelon Perla, MD at 07/28/2014 1:30 PM     Status: Written Related Problem: S/P aortic valve replacement   Expand All Collapse All   Continue SBE prophylaxis.            Gastritis and gastroduodenitis (Resolved 07/07/2012) - Lelon Perla, MD at 07/28/2014 1:30 PM     Status: Written Related Problem: Gastritis and gastroduodenitis   Expand All Collapse All   noncontrast chest CT in July at [facility]       For DCCV; no changes Kirk Ruths

## 2014-08-25 NOTE — Anesthesia Postprocedure Evaluation (Signed)
  Anesthesia Post-op Note  Patient: Calvin [name]  Procedure(s) Performed: Procedure(s): CARDIOVERSION (N/A)  Patient Location: Endoscopy Unit  Anesthesia Type:MAC  Level of Consciousness: awake  Airway and Oxygen Therapy: Patient Spontanous Breathing  Post-op Pain: none  Post-op Assessment: Post-op Vital signs reviewed, Patient's Cardiovascular Status Stable, Respiratory Function Stable, Patent Airway, No signs of Nausea or vomiting and Pain level controlled  Post-op Vital Signs: Reviewed and stable  Last Vitals:  Filed Vitals:   08/25/14 1343  BP: 135/43  Pulse: 55  Temp:   Resp: 17    Complications: No apparent anesthesia complications

## 2014-08-25 NOTE — Anesthesia Preprocedure Evaluation (Signed)
Anesthesia Evaluation  Patient identified by MRN, date of birth, ID band Patient awake    Reviewed: Allergy & Precautions, H&P , NPO status , Patient's Chart, lab work & pertinent test results  History of Anesthesia Complications (+) PONV and history of anesthetic complications  Airway Mallampati: I  TM Distance: >3 FB Neck ROM: Full    Dental  (+) Dental Advisory Given   Pulmonary former smoker,  breath sounds clear to auscultation        Cardiovascular hypertension, Pt. on medications + CAD and + Peripheral Vascular Disease + dysrhythmias Atrial Fibrillation Rhythm:Irregular  Echo 4/13 EF 55-60%, mild LVH S/p aortic valve repair, coarctation of aorta   Neuro/Psych negative neurological ROS  negative psych ROS   GI/Hepatic PUD,   Endo/Other  Morbid obesity  Renal/GU Renal InsufficiencyRenal disease     Musculoskeletal   Abdominal   Peds  Hematology   Anesthesia Other Findings   Reproductive/Obstetrics                             Anesthesia Physical  Anesthesia Plan  ASA: III  Anesthesia Plan: MAC   Post-op Pain Management:    Induction: Intravenous  Airway Management Planned: Mask  Additional Equipment: None  Intra-op Plan:   Post-operative Plan:   Informed Consent: I have reviewed the patients History and Physical, chart, labs and discussed the procedure including the risks, benefits and alternatives for the proposed anesthesia with the patient or authorized representative who has indicated his/her understanding and acceptance.   Dental advisory given  Plan Discussed with: Surgeon and CRNA  Anesthesia Plan Comments:         Anesthesia Quick Evaluation

## 2014-08-26 ENCOUNTER — Encounter (HOSPITAL_COMMUNITY): Payer: Self-pay | Admitting: Cardiology

## 2014-08-28 ENCOUNTER — Encounter: Payer: Self-pay | Admitting: Hematology & Oncology

## 2014-08-28 ENCOUNTER — Other Ambulatory Visit (HOSPITAL_BASED_OUTPATIENT_CLINIC_OR_DEPARTMENT_OTHER): Payer: BLUE CROSS/BLUE SHIELD

## 2014-08-28 ENCOUNTER — Ambulatory Visit (HOSPITAL_BASED_OUTPATIENT_CLINIC_OR_DEPARTMENT_OTHER): Payer: BLUE CROSS/BLUE SHIELD

## 2014-08-28 ENCOUNTER — Ambulatory Visit (HOSPITAL_BASED_OUTPATIENT_CLINIC_OR_DEPARTMENT_OTHER): Payer: BLUE CROSS/BLUE SHIELD | Admitting: Hematology & Oncology

## 2014-08-28 VITALS — BP 123/42 | HR 63 | Temp 98.2°F | Resp 20 | Ht 72.0 in | Wt 325.0 lb

## 2014-08-28 DIAGNOSIS — B1920 Unspecified viral hepatitis C without hepatic coma: Secondary | ICD-10-CM

## 2014-08-28 DIAGNOSIS — E291 Testicular hypofunction: Secondary | ICD-10-CM

## 2014-08-28 DIAGNOSIS — D509 Iron deficiency anemia, unspecified: Secondary | ICD-10-CM

## 2014-08-28 DIAGNOSIS — D649 Anemia, unspecified: Secondary | ICD-10-CM

## 2014-08-28 DIAGNOSIS — D631 Anemia in chronic kidney disease: Secondary | ICD-10-CM

## 2014-08-28 DIAGNOSIS — I4819 Other persistent atrial fibrillation: Secondary | ICD-10-CM

## 2014-08-28 DIAGNOSIS — N289 Disorder of kidney and ureter, unspecified: Secondary | ICD-10-CM

## 2014-08-28 DIAGNOSIS — N189 Chronic kidney disease, unspecified: Secondary | ICD-10-CM

## 2014-08-28 DIAGNOSIS — Z954 Presence of other heart-valve replacement: Secondary | ICD-10-CM

## 2014-08-28 DIAGNOSIS — Z7901 Long term (current) use of anticoagulants: Secondary | ICD-10-CM

## 2014-08-28 DIAGNOSIS — C6291 Malignant neoplasm of right testis, unspecified whether descended or undescended: Secondary | ICD-10-CM

## 2014-08-28 LAB — CMP (CANCER CENTER ONLY)
ALK PHOS: 48 U/L (ref 26–84)
ALT(SGPT): 27 U/L (ref 10–47)
AST: 16 U/L (ref 11–38)
Albumin: 3.5 g/dL (ref 3.3–5.5)
BILIRUBIN TOTAL: 1 mg/dL (ref 0.20–1.60)
BUN: 104 mg/dL — AB (ref 7–22)
CALCIUM: 9.1 mg/dL (ref 8.0–10.3)
CHLORIDE: 101 meq/L (ref 98–108)
CO2: 22 meq/L (ref 18–33)
CREATININE: 4.2 mg/dL — AB (ref 0.6–1.2)
Glucose, Bld: 118 mg/dL (ref 73–118)
Potassium: 4.8 mEq/L — ABNORMAL HIGH (ref 3.3–4.7)
SODIUM: 135 meq/L (ref 128–145)
Total Protein: 6.6 g/dL (ref 6.4–8.1)

## 2014-08-28 LAB — IRON AND TIBC CHCC
%SAT: 8 % — ABNORMAL LOW (ref 20–55)
Iron: 17 ug/dL — ABNORMAL LOW (ref 42–163)
TIBC: 218 ug/dL (ref 202–409)
UIBC: 201 ug/dL (ref 117–376)

## 2014-08-28 LAB — CBC WITH DIFFERENTIAL (CANCER CENTER ONLY)
BASO#: 0 10*3/uL (ref 0.0–0.2)
BASO%: 0.2 % (ref 0.0–2.0)
EOS ABS: 0.1 10*3/uL (ref 0.0–0.5)
EOS%: 1.1 % (ref 0.0–7.0)
HEMATOCRIT: 26.2 % — AB (ref 38.7–49.9)
HEMOGLOBIN: 8.9 g/dL — AB (ref 13.0–17.1)
LYMPH#: 0.6 10*3/uL — AB (ref 0.9–3.3)
LYMPH%: 7.6 % — ABNORMAL LOW (ref 14.0–48.0)
MCH: 28.5 pg (ref 28.0–33.4)
MCHC: 34 g/dL (ref 32.0–35.9)
MCV: 84 fL (ref 82–98)
MONO#: 0.5 10*3/uL (ref 0.1–0.9)
MONO%: 6.6 % (ref 0.0–13.0)
NEUT%: 84.5 % — ABNORMAL HIGH (ref 40.0–80.0)
NEUTROS ABS: 6.8 10*3/uL — AB (ref 1.5–6.5)
Platelets: 178 10*3/uL (ref 145–400)
RBC: 3.12 10*6/uL — ABNORMAL LOW (ref 4.20–5.70)
RDW: 14.9 % (ref 11.1–15.7)
WBC: 8.1 10*3/uL (ref 4.0–10.0)

## 2014-08-28 LAB — TESTOSTERONE: TESTOSTERONE: 57 ng/dL — AB (ref 300–890)

## 2014-08-28 LAB — CHCC SATELLITE - SMEAR

## 2014-08-28 LAB — FERRITIN CHCC

## 2014-08-28 MED ORDER — DARBEPOETIN ALFA 300 MCG/0.6ML IJ SOSY
300.0000 ug | PREFILLED_SYRINGE | Freq: Once | INTRAMUSCULAR | Status: AC
  Administered 2014-08-28: 300 ug via SUBCUTANEOUS

## 2014-08-28 MED ORDER — DARBEPOETIN ALFA-POLYSORBATE 200 MCG/0.4ML IJ SOLN: 300.0000 ug | Freq: Once | INTRAMUSCULAR | Status: DC

## 2014-08-28 MED ORDER — SODIUM CHLORIDE 0.9 % IV SOLN
INTRAVENOUS | Status: DC
  Administered 2014-08-28: 12:00:00 via INTRAVENOUS

## 2014-08-28 MED ORDER — SODIUM CHLORIDE 0.9 % IV SOLN
510.0000 mg | Freq: Once | INTRAVENOUS | Status: AC
  Administered 2014-08-28: 510 mg via INTRAVENOUS
  Filled 2014-08-28: qty 17

## 2014-08-28 NOTE — Progress Notes (Signed)
Hematology and Oncology Follow Up Visit  Calvin Calvin [name] [id number] 1955-09-08 59 y.o. 08/28/2014   Principle Diagnosis:  1. anemia of renal insufficiency 2. Intermittent iron deficiency anemia 3. mechanical tricuspid valve-lifelong Coumadin 4. hypo-testosteronemia  Current Therapy:   1. Aranesp 300 mcg subcu as needed for hemoglobin less than 11. 2. IV iron as indicated. 3. Coumadin - lifelong.     Interim History:  Mr.  Calvin [name] is back for followup. He does not feel well. He feels very tired. He knows that his blood count is low.  He was cardioverted recently. He goes into paroxysmal atrial fibrillation. He is on lifelong Coumadin because of a mechanical heart valve.  After the cardioversion, he felt a little bit better but then began to decline in his overall status. He started to have some edema in his legs. He started to feel more short of breath. He had a very difficult time working.  I do still think he is going to be able to do his kind of work. His anemia is so significant that he just is not able to walk any great distance. He gets fatigued quite easily and he has to travel quite a bit. I just do not think he is capable of doing this.  He's had no nausea or vomiting.  He's had no fever. He's had no sweats. Medications:  Current outpatient prescriptions:  .  acetaminophen (TYLENOL) 500 MG tablet, Take 500 mg by mouth as needed. For pain, Disp: , Rfl:  .  amLODipine (NORVASC) 10 MG tablet, TAKE 1 TABLET (10 MG TOTAL) BY MOUTH DAILY., Disp: 90 tablet, Rfl: 3 .  b complex vitamins tablet, Take 1 tablet by mouth daily. , Disp: , Rfl:  .  cloNIDine (CATAPRES) 0.1 MG tablet, Take 1 tablet (0.1 mg total) by mouth 3 (three) times daily., Disp: 270 tablet, Rfl: 0 .  COLCRYS 0.6 MG tablet, Take 0.6 mg by mouth as needed (gout). , Disp: , Rfl:  .  cyclobenzaprine (FLEXERIL) 10 MG tablet, Take 5-10 mg by mouth at bedtime as needed for muscle spasms. , Disp: , Rfl:  .  furosemide (LASIX) 40  MG tablet, Take 20-40 mg by mouth every morning. , Disp: , Rfl:  .  HYDROcodone-acetaminophen (NORCO/VICODIN) 5-325 MG per tablet, Take 1 tablet by mouth every 6 (six) hours as needed for moderate pain or severe pain. , Disp: , Rfl: 0 .  labetalol (NORMODYNE) 200 MG tablet, 2 tabs am 1 tab pm, Disp: 270 tablet, Rfl: 1 .  olmesartan (BENICAR) 40 MG tablet, Take 20 mg by mouth daily., Disp: , Rfl:  .  pantoprazole (PROTONIX) 40 MG tablet, Take 40 mg by mouth 3 (three) times a week. Takes on Monday, Wednesday, and Friday, Disp: , Rfl:  .  predniSONE (DELTASONE) 5 MG tablet, Take 5 mg by mouth daily as needed (gout). , Disp: , Rfl:  .  sildenafil (VIAGRA) 50 MG tablet, Take 50 mg by mouth daily as needed for erectile dysfunction. , Disp: , Rfl:  .  sodium bicarbonate 650 MG tablet, Take 650 mg by mouth 2 (two) times daily. , Disp: , Rfl:  .  Testosterone 10 MG/ACT (2%) GEL, APPLY 2 PUMPS TO THIGHS AS DIRECTED (Patient taking differently: APPLY 2 PUMPS TO THIGHS AS DIRECTED DAILY), Disp: 60 g, Rfl: 2 .  tetrahydrozoline 0.05 % ophthalmic solution, Place 1 drop into both eyes daily as needed (red/dry eyes)., Disp: , Rfl:  .  ULORIC 80 MG TABS, Take 80  mg by mouth daily., Disp: , Rfl:  .  warfarin (COUMADIN) 7.5 MG tablet, Take 3.75-7.5 mg by mouth daily. Take 3.75 on four days of the week and 7.5 on all the other days, Disp: , Rfl:  .  amoxicillin (AMOXIL) 500 MG capsule, Take 500 mg by mouth daily as needed (1 hour prior to dental appts). , Disp: , Rfl:   Allergies:  Allergies  Allergen Reactions  . Ace Inhibitors Anxiety    Diffuse swelling No issues with Benicar Edema Diffuse swelling No issues with Benicar    Past Medical History, Surgical history, Social history, and Family History were reviewed and updated.  Review of Systems: As above  Physical Exam:  height is 6' (1.829 m) and weight is 325 lb (147.419 kg). His oral temperature is 98.2 F (36.8 C). His blood pressure is 123/42 and  his pulse is 63. His respiration is 20.   Well-developed and well-nourished white cell and. He is somewhat overweight. Head and neck exam shows no ocular or oral lesions. There is no palpable cervical or supraclavicular lymph nodes. Lungs are clear. Cardiac exam shows a click from his mechanical tricuspid valve. This is a systolic click. He has no murmurs. Abdomen is soft. He is obese. He has good bowel sounds. There is no palpable liver or spleen tip. Back exam shows no tenderness over the spine ribs or hips. Extremities shows 2+ edema in his legs. He has decent strength in his legs. He has decent range of motion in his joints. Skin exam no rashes ecchymosis or petechia. Neurological exam is nonfocal.  Lab Results  Component Value Date   WBC 8.1 08/28/2014   HGB 8.9* 08/28/2014   HCT 26.2* 08/28/2014   MCV 84 08/28/2014   PLT 178 08/28/2014     Chemistry      Component Value Date/Time   NA 137 07/30/2014 0954   NA 138 11/18/2013 0920   NA 141 10/16/2013 1033   K 4.4 07/30/2014 0954   K 4.4 11/18/2013 0920   K 4.4 10/16/2013 1033   CL 104 07/30/2014 0954   CL 102 10/16/2013 1033   CO2 22 07/30/2014 0954   CO2 24 11/18/2013 0920   CO2 26 10/16/2013 1033   BUN 74* 07/30/2014 0954   BUN 48.8* 11/18/2013 0920   BUN 55* 10/16/2013 1033   CREATININE 3.21* 07/30/2014 0954   CREATININE 2.3* 11/18/2013 0920   CREATININE 2.4* 10/16/2013 1033      Component Value Date/Time   CALCIUM 9.4 07/30/2014 0954   CALCIUM 9.3 11/18/2013 0920   CALCIUM 9.1 10/16/2013 1033   ALKPHOS 46 05/26/2014 0954   ALKPHOS 51 11/18/2013 0920   ALKPHOS 39 10/16/2013 1033   AST 22 05/26/2014 0954   AST 25 11/18/2013 0920   AST 22 10/16/2013 1033   ALT 38 05/26/2014 0954   ALT 34 11/18/2013 0920   ALT 21 10/16/2013 1033   BILITOT 0.5 05/26/2014 0954   BILITOT 0.32 11/18/2013 0920   BILITOT 0.80 10/16/2013 1033      Impression and Plan: Calvin Calvin [name] is 59 year old gentleman with multiple anemia etiologies.  He clearly looks worse today. He's gained 10 pounds. He is retaining fluid.  He is much more anemic. He says that he is not bleeding area in  We will go ahead and give him Aranesp and iron today.  He has a high ferritin but I think this is an acute phase reactant.  We will have to see him probably in  about 4-6 weeks so that we can be aggressive with his blood counts.  He is having a very difficult time working because of his anemia. I understand this. He has no control over this. We will have to write a letter so that he can forego his work and concentrate on his health.  I spent about 30 minutes with him today. I went over his labs. I reviewed his blood smear.  I did find a lab result on him. It shows that he has hepatitis C. This, probably, is from his transfusions. I am sure that he will need to be treated for this. I'm sure that this is a significant factor in his fatigue and not feeling well.    Volanda Napoleon, MD 4/29/201611:07 AM

## 2014-08-28 NOTE — Patient Instructions (Signed)

## 2014-09-17 ENCOUNTER — Encounter: Payer: Self-pay | Admitting: Cardiology

## 2014-09-20 ENCOUNTER — Encounter (HOSPITAL_COMMUNITY): Payer: Self-pay | Admitting: Cardiology

## 2014-09-20 NOTE — Addendum Note (Signed)
Addendum  created 09/20/14 2022 by Laurie Panda, MD   Modules edited: Anesthesia Events

## 2014-09-23 ENCOUNTER — Encounter: Payer: Self-pay | Admitting: Cardiology

## 2014-09-24 ENCOUNTER — Encounter: Payer: Self-pay | Admitting: Cardiology

## 2014-09-25 ENCOUNTER — Other Ambulatory Visit: Payer: Self-pay | Admitting: Cardiology

## 2014-10-02 ENCOUNTER — Other Ambulatory Visit: Payer: BLUE CROSS/BLUE SHIELD

## 2014-10-02 ENCOUNTER — Ambulatory Visit: Payer: BLUE CROSS/BLUE SHIELD

## 2014-10-02 ENCOUNTER — Ambulatory Visit: Payer: BLUE CROSS/BLUE SHIELD | Admitting: Hematology & Oncology

## 2014-10-09 ENCOUNTER — Other Ambulatory Visit: Payer: Self-pay | Admitting: Hematology & Oncology

## 2014-10-15 ENCOUNTER — Encounter: Payer: Self-pay | Admitting: Cardiology

## 2014-10-16 ENCOUNTER — Telehealth: Payer: Self-pay | Admitting: Cardiology

## 2014-10-16 NOTE — Telephone Encounter (Signed)
Closed encounter °

## 2014-10-18 ENCOUNTER — Other Ambulatory Visit: Payer: Self-pay | Admitting: Hematology & Oncology

## 2014-10-28 ENCOUNTER — Encounter: Payer: Self-pay | Admitting: Cardiology

## 2014-10-28 ENCOUNTER — Ambulatory Visit (INDEPENDENT_AMBULATORY_CARE_PROVIDER_SITE_OTHER): Payer: BLUE CROSS/BLUE SHIELD | Admitting: Cardiology

## 2014-10-28 VITALS — BP 111/61 | HR 63 | Ht 72.0 in | Wt 306.0 lb

## 2014-10-28 DIAGNOSIS — I10 Essential (primary) hypertension: Secondary | ICD-10-CM

## 2014-10-28 DIAGNOSIS — N183 Chronic kidney disease, stage 3 unspecified: Secondary | ICD-10-CM

## 2014-10-28 DIAGNOSIS — I480 Paroxysmal atrial fibrillation: Secondary | ICD-10-CM | POA: Diagnosis not present

## 2014-10-28 DIAGNOSIS — Z954 Presence of other heart-valve replacement: Secondary | ICD-10-CM

## 2014-10-28 DIAGNOSIS — Q251 Coarctation of aorta: Secondary | ICD-10-CM

## 2014-10-28 DIAGNOSIS — I712 Thoracic aortic aneurysm, without rupture, unspecified: Secondary | ICD-10-CM

## 2014-10-28 DIAGNOSIS — Z952 Presence of prosthetic heart valve: Secondary | ICD-10-CM

## 2014-10-28 MED ORDER — CLONIDINE HCL 0.1 MG PO TABS: 0.1000 mg | ORAL_TABLET | Freq: Every day | ORAL | Status: DC

## 2014-10-28 NOTE — Assessment & Plan Note (Signed)
As outlined under thoracic aortic aneurysm we will arrange an evaluation at [facility] given recent measurement of 5.7 cm. He will need follow-up studies in the future.

## 2014-10-28 NOTE — Assessment & Plan Note (Signed)
Patient in sinus today. Continue beta blocker and Coumadin. He is having more frequent episodes and would likely benefit from an anti-arrhythmic. However options are limited given his renal insufficiency and structural heart disease. Amiodarone would be likely the only choice. However he has now been diagnosed with hepatitis C which is active. He will discuss with his gastroenterologist whether amiodarone would be acceptable. We may need to refer to atrial fibrillation clinic for further discussion concerning anti-rhythmic choice in the future.

## 2014-10-28 NOTE — Assessment & Plan Note (Signed)
Continue Coumadin and SBE prophylaxis. Repeat echocardiogram. Patient does not plan to follow-up at [facility] clinic.

## 2014-10-28 NOTE — Patient Instructions (Signed)
Your physician recommends that you schedule a follow-up appointment in: [place] CLONIDINE TO 0.1 MG TWICE DAILY X 3 DAYS THEN DECREASE TO ONCE DAILY  Your physician has requested that you have an echocardiogram. Echocardiography is a painless test that uses sound waves to create images of your heart. It provides your doctor with information about the size and shape of your heart and how well your heart's chambers and valves are working. This procedure takes approximately one hour. There are no restrictions for this procedure.

## 2014-10-28 NOTE — Assessment & Plan Note (Signed)
Blood pressure is borderline now that dialysis initiated. Wean clonidine to 0.1 mg daily. If blood pressure remains borderline we will discontinue altogether and we also would be able to decrease amlodipine or Benicar.

## 2014-10-28 NOTE — Progress Notes (Signed)
HPI: FU paroxysmal atrial fibrillation/flutter, s/p aortic valve replacement, aortic coarctation, aortic aneurysm, coronary artery disease on CT scan and CKD. Patient is s/p coarctation repair in 1958 and redo in 1971. Had endovascular stent in 1995 and 1996. Had aortic balloon valvuloplasty in 1974 and AVR in 1994. 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 Stent in distal arch, proximal descending aorta patent. Mildly enlarged lymph nodes. He is seen at the [facility] clinic on a yearly basis for followup of above. Echo 7/15 showed normal LV function, biatrial enlargement, AVR with mean gradient of 18 mmHg, dilated aortic root (unchanged); coarctation with peak gradient of 60 mmHg. Plan is to obtain Chest CT without contrast next year to survey his thoracic aorta.  Also with h/o recurrent symptomatic atrial fibrillation. Repeat DCCV 4/16.   Since last seen he was admitted to [facility] with pneumonia. His renal function apparently had deteriorated. He was placed on dialysis. He also had a CT scan that was noncontrast that showed his aorta was 5.7 cm by his report. 10 days ago he developed symptoms of atrial fibrillation which resolved 2 days ago. He otherwise has not had dyspnea, chest pain, syncope. Occasional mild pedal edema. His blood pressure is borderline on dialysis. He has also been diagnosed with hepatitis C.  Current Outpatient Prescriptions  Medication Sig Dispense Refill  . amLODipine (NORVASC) 10 MG tablet TAKE 1 TABLET (10 MG TOTAL) BY MOUTH DAILY. 90 tablet 3  . amoxicillin (AMOXIL) 500 MG capsule Take 500 mg by mouth daily as needed (1 hour prior to dental appts).     . BENICAR 40 MG tablet TAKE 1 TABLET (40 MG TOTAL) BY MOUTH DAILY. 90 tablet 0  . cloNIDine (CATAPRES) 0.1 MG tablet TAKE 1 TABLET (0.1 MG TOTAL) BY MOUTH 3 (THREE) TIMES DAILY. 270 tablet 0  . COLCRYS 0.6 MG tablet Take 0.6 mg by mouth as needed (gout).     . furosemide  (LASIX) 40 MG tablet Take 20-40 mg by mouth every morning.     Marland Kitchen HYDROcodone-acetaminophen (NORCO/VICODIN) 5-325 MG per tablet Take 1 tablet by mouth every 6 (six) hours as needed for moderate pain or severe pain.   0  . labetalol (NORMODYNE) 200 MG tablet 2 tabs am 1 tab pm 270 tablet 1  . pantoprazole (PROTONIX) 40 MG tablet Take 40 mg by mouth 3 (three) times a week. Takes on Monday, Wednesday, and Friday    . Testosterone 10 MG/ACT (2%) GEL APPLY 2 PUMPS TO THIGHS AS DIRECTED 60 g 2  . warfarin (COUMADIN) 7.5 MG tablet Take 3.75-7.5 mg by mouth daily. Take 3.75 on four days of the week and 7.5 on all the other days     No current facility-administered medications for this visit.     Past Medical History  Diagnosis Date  . 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 HTN (hypertension)   . CAD (coronary artery disease)   . Aortic aneurysm   . Gout   . Atrial fibrillation   . Anemia   . Hyperlipidemia   . Atrial flutter   . Obesity   . Anemia of renal disease 10/04/2011  . CKD (chronic kidney disease)     baseline creatinine is 1.5-2  . Gastric polyp 2007, 06/2012    benign.  . Colonic ulcer 2007    felt to be secondary to NSAID  . Testicular adenoma   . PONV (postoperative  nausea and vomiting)     Past Surgical History  Procedure Laterality Date  . Coarctation of aorta repair  1958  . Coarctation of aorta repair  1970  . Aortic valve replacement and mitral valve repair  1974    Bicuspid aortic valve  . Orchiectomy  1992    testicular cancer  . Aortic valve replacement  1995    [facility]  . Tonsillectomy    . Cardioversion  02/27/2012    Procedure: CARDIOVERSION;  Surgeon: Lelon Perla, MD;  Location: [facility] ENDOSCOPY;  Service: Cardiovascular;  Laterality: N/A;  . Esophagogastroduodenoscopy N/A 07/04/2012    Procedure: ESOPHAGOGASTRODUODENOSCOPY (EGD);  Surgeon: Inda Castle, MD;  Location: [facility];  Service: Endoscopy;  Laterality: N/A;  .  Cardioversion N/A 05/29/2013    Procedure: CARDIOVERSION;  Surgeon: Lelon Perla, MD;  Location: [facility] ENDOSCOPY;  Service: Cardiovascular;  Laterality: N/A;  . Cardioversion N/A 12/15/2013    Procedure: CARDIOVERSION;  Surgeon: Sueanne Margarita, MD;  Location: [facility] ENDOSCOPY;  Service: Cardiovascular;  Laterality: N/A;  . Cardioversion N/A 08/25/2014    Procedure: CARDIOVERSION;  Surgeon: Lelon Perla, MD;  Location: [facility] ENDOSCOPY;  Service: Cardiovascular;  Laterality: N/A;    History   Social History  . Marital Status: Married    Spouse Name: N/A  . Number of Children: 4  . Years of Education: N/A   Occupational History  . Not on file.   Social History Main Topics  . Smoking status: Former Smoker -- 0.25 packs/day for 7 years    Types: Cigarettes    Start date: 05/28/1975    Quit date: 05/27/1981  . Smokeless tobacco: Never Used     Comment: quit 25 years ago  . Alcohol Use: 0.0 oz/week    0 Standard drinks or equivalent per week     Comment: wine, occasionally  . Drug Use: No  . Sexual Activity: Not on file   Other Topics Concern  . Not on file   Social History Narrative   Pt lives in [place] with wife.  3 children ages 31,20,22 (all 3 are adopted)   Sales grinding wheels.    ROS: no fevers or chills, productive cough, hemoptysis, dysphasia, odynophagia, melena, hematochezia, dysuria, hematuria, rash, seizure activity, orthopnea, PND, claudication. Remaining systems are negative.  Physical Exam: Well-developed well-nourished in no acute distress.  Skin is warm and dry.  HEENT is normal.  Neck is supple.  Chest is clear to auscultation with normal expansion.  Cardiovascular exam is regular rate and rhythm. 2/6 systolic murmur left sternal border. Abdominal exam nontender or distended. No masses palpated. Extremities show no edema. neuro grossly intact  ECG sinus rhythm with first-degree AV block. Normal axis. Left ventricular hypertrophy.

## 2014-10-28 NOTE — Assessment & Plan Note (Signed)
Recent noncontrast chest CT at [facility] apparently showed 5.7 cm aneurysm. This is similar to previous measurements. He was told previously that this would be followed as the risk of rupture would be less likely with scar tissue. He does not plan to follow-up at [facility] clinic. I will arrange an evaluation at [facility]. We will avoid MRA given renal insufficiency. If dialysis turns out to be permanent we will plan CTA's in the future.

## 2014-10-28 NOTE — Assessment & Plan Note (Signed)
Now on dialysis.

## 2014-11-04 ENCOUNTER — Encounter: Payer: Self-pay | Admitting: Cardiology

## 2014-11-04 ENCOUNTER — Ambulatory Visit (HOSPITAL_BASED_OUTPATIENT_CLINIC_OR_DEPARTMENT_OTHER)
Admission: RE | Admit: 2014-11-04 | Discharge: 2014-11-04 | Disposition: A | Discharge status: Home / Self Care | Payer: BLUE CROSS/BLUE SHIELD | Source: Ambulatory Visit | Attending: Cardiology | Admitting: Cardiology

## 2014-11-04 DIAGNOSIS — I517 Cardiomegaly: Secondary | ICD-10-CM | POA: Insufficient documentation

## 2014-11-04 DIAGNOSIS — I4891 Unspecified atrial fibrillation: Secondary | ICD-10-CM | POA: Diagnosis present

## 2014-11-04 DIAGNOSIS — I480 Paroxysmal atrial fibrillation: Secondary | ICD-10-CM

## 2014-11-04 NOTE — Progress Notes (Signed)
  Echocardiogram 2D Echocardiogram has been performed.  Calvin [name] 11/04/2014, 11:16 AM

## 2014-11-05 ENCOUNTER — Telehealth: Payer: Self-pay | Admitting: Cardiology

## 2014-11-05 NOTE — Telephone Encounter (Signed)
Echo results called to patient. Voiced understanding.  Released to [place].

## 2014-11-05 NOTE — Telephone Encounter (Signed)
Returning your call,concerning his test results.

## 2014-11-11 ENCOUNTER — Telehealth: Payer: Self-pay | Admitting: Cardiology

## 2014-11-11 NOTE — Telephone Encounter (Signed)
Arbie Cookey called in wanting to know if it would be ok to prescribed Amiodarone for the pt. She is asking because she doesn't want it to counteract with any Hep C medication that he is taking. Please call  Thanks

## 2014-11-11 NOTE — Telephone Encounter (Signed)
Spoke w/ Arbie Cookey at [facility] - stated concern by Dr. Lolita Lenz at [facility] d/t pt needing to be started on Hep C course in near future, known interaction w/ amiodarone & Hep C medications.  I gave list of meds verified at our office on 10/28/14. Amiodarone not on current med list.  She acknowledged their transcriber may have entered wrong med - she will verify meds w/ pt and call back if concerns.

## 2014-11-16 ENCOUNTER — Telehealth: Payer: Self-pay | Admitting: *Deleted

## 2014-11-16 NOTE — Telephone Encounter (Signed)
Spoke with pt, we received a letter from Dr Lolita Lenz regarding the use of amiodarone for the pts atrial fib as it is related to Hepatitis C treatment. Amiodarone interacts with most of the hepatitis c treatment medications and they request we use an alternative drug. Dr Stanford Breed would like the patient to see dr allred to help figure out the best alternatives for this patient. Patient would like to know if there are any options for him prior to making that appointment. Will ask dr allred to review to see if making an appointment for the patient is warranted.

## 2014-11-25 ENCOUNTER — Other Ambulatory Visit: Payer: Self-pay | Admitting: *Deleted

## 2014-11-25 ENCOUNTER — Encounter: Payer: Self-pay | Admitting: Cardiology

## 2014-11-25 ENCOUNTER — Encounter: Payer: Self-pay | Admitting: Hematology & Oncology

## 2014-11-25 ENCOUNTER — Encounter: Payer: Self-pay | Admitting: *Deleted

## 2014-11-25 DIAGNOSIS — E291 Testicular hypofunction: Secondary | ICD-10-CM

## 2014-11-25 MED ORDER — TESTOSTERONE 40.5 MG/2.5GM (1.62%) TD GEL: TRANSDERMAL | Status: DC

## 2014-11-25 NOTE — Telephone Encounter (Signed)
Will ask Gay Filler to review interactions and assist with recommendation

## 2014-11-26 NOTE — Telephone Encounter (Signed)
Discussed most up to date Hep C treatment with ID pharmacist.  They all have a high risk of increasing the amiodarone concentration.  The only combination listed as a "moderate" interaction would be elbasvir/grazoprevir Engineer, maintenance (IT)).  Unsure that we have any other pharmacologic treatment options for his afib.  He is not a candidate for Class I agents due to CAD.  He has ESRD on HD so not a candidate for Tikosyn or sotalol.  If rhythm control is needed, would suggest using Zepatier for Hep C and the lowest dose possible of amiodarone with close follow up of QTc.  May have to pursue rate control only if he is started on antiviral and then reconsider rhythm control once he has completed course of therapy.

## 2014-11-26 NOTE — Telephone Encounter (Signed)
Please let pt know Calvin [name]

## 2014-11-30 NOTE — Telephone Encounter (Signed)
Message sent to patient through my chart.  

## 2014-12-18 ENCOUNTER — Other Ambulatory Visit: Payer: Self-pay | Admitting: Cardiology

## 2014-12-21 ENCOUNTER — Other Ambulatory Visit: Payer: Self-pay

## 2014-12-21 DIAGNOSIS — I4891 Unspecified atrial fibrillation: Secondary | ICD-10-CM

## 2014-12-21 NOTE — Telephone Encounter (Signed)
Approved      Disp Refills Start End    labetalol (NORMODYNE) 200 MG tablet 270 tablet 1 07/02/2014     Sig:  2 tabs am 1 tab pm    Class:  Normal    DAW:  No    Notes:      >> PRICE, Eliakim Tendler A  Wed Oct 28, 2014 10:48 AM Per pt Labetalol is 200 MG and he is taking a total of 6 tablets daily     Authorizing Provider:  Lelon Perla, MD    Ordering User:  Guinevere Ferrari

## 2014-12-23 ENCOUNTER — Other Ambulatory Visit: Payer: Self-pay | Admitting: *Deleted

## 2014-12-23 DIAGNOSIS — I4891 Unspecified atrial fibrillation: Secondary | ICD-10-CM

## 2014-12-23 MED ORDER — LABETALOL HCL 200 MG PO TABS: ORAL_TABLET | ORAL | Status: DC

## 2014-12-29 NOTE — Progress Notes (Signed)
HPI: FU paroxysmal atrial fibrillation/flutter, s/p aortic valve replacement, aortic coarctation, aortic aneurysm, coronary artery disease on CT scan and CKD. Patient is s/p coarctation repair in 1958 and redo in 1971. Had endovascular stent in 1995 and 1996. Had aortic balloon valvuloplasty in 1974 and AVR in 1994. 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 Stent in distal arch, proximal descending aorta patent. Mildly enlarged lymph nodes. He was followed at the [facility] clinic on a yearly basis but now plans fu at [facility] or [place] is to obtain Chest CT without contrast 5/17 to survey his thoracic aorta. Echocardiogram July 2016 showed normal LV function, severe left ventricular hypertrophy, prior aortic valve replacement with mean gradient 17 mmHg, severe left atrial enlargement.  Also with h/o recurrent symptomatic atrial fibrillation. Repeat DCCV 4/16.Had chest CT 5/16 in [facility] that showed his aorta was 5.7 x 5.8 cm. He also has been diagnosed with hepatitis C which will require therapy in the future. The gastroenterologists at [facility] recommended against amiodarone given the potential interaction with medications used to treat hepatitis C. He is now on dialysis. Since last seen, patient noted that approximately 2 weeks ago he went back into atrial fibrillation. He has some dyspnea on exertion but no orthopnea, PND, pedal edema, chest pain, syncope or bleeding.  Current Outpatient Prescriptions  Medication Sig Dispense Refill  . amLODipine (NORVASC) 10 MG tablet TAKE 1 TABLET (10 MG TOTAL) BY MOUTH DAILY. 90 tablet 3  . amoxicillin (AMOXIL) 500 MG capsule Take 500 mg by mouth daily as needed (1 hour prior to dental appts).     . BENICAR 40 MG tablet TAKE 1 TABLET (40 MG TOTAL) BY MOUTH DAILY. 90 tablet 1  . cloNIDine (CATAPRES) 0.1 MG tablet Take 1 tablet (0.1 mg total) by mouth daily. 270 tablet 0  . COLCRYS 0.6 MG tablet Take 0.6 mg  by mouth as needed (gout).     . furosemide (LASIX) 40 MG tablet Take 40 mg by mouth 2 (two) times daily.     Marland Kitchen HYDROcodone-acetaminophen (NORCO/VICODIN) 5-325 MG per tablet Take 1 tablet by mouth every 6 (six) hours as needed for moderate pain or severe pain.   0  . labetalol (NORMODYNE) 200 MG tablet 2 tabs am 1 tab pm (Patient taking differently: Take 200 mg by mouth 3 (three) times daily. 2 tabs am 1 tab pm) 270 tablet 0  . pantoprazole (PROTONIX) 40 MG tablet Take 40 mg by mouth 3 (three) times a week. Takes on Monday, Wednesday, and Friday    . sevelamer carbonate (RENVELA) 800 MG tablet Take 800 mg by mouth 3 (three) times daily with meals.    . Testosterone 40.5 MG/2.5GM (1.62%) GEL 2 pumps onto thighs daily 75 g 3  . warfarin (COUMADIN) 7.5 MG tablet Take 3.75-7.5 mg by mouth daily. Take 3.75 on four days of the week and 7.5 on all the other days     No current facility-administered medications for this visit.     Past Medical History  Diagnosis Date  . 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 HTN (hypertension)   . CAD (coronary artery disease)   . Aortic aneurysm   . Gout   . Atrial fibrillation   . Anemia   . Hyperlipidemia   . Atrial flutter   . Obesity   . Anemia of renal disease 10/04/2011  . CKD (chronic kidney disease)  baseline creatinine is 1.5-2  . Gastric polyp 2007, 06/2012    benign.  . Colonic ulcer 2007    felt to be secondary to NSAID  . Testicular adenoma   . PONV (postoperative nausea and vomiting)     Past Surgical History  Procedure Laterality Date  . Coarctation of aorta repair  1958  . Coarctation of aorta repair  1970  . Aortic valve replacement and mitral valve repair  1974    Bicuspid aortic valve  . Orchiectomy  1992    testicular cancer  . Aortic valve replacement  1995    [facility]  . Tonsillectomy    . Cardioversion  02/27/2012    Procedure: CARDIOVERSION;  Surgeon: Lelon Perla, MD;  Location: [facility] ENDOSCOPY;   Service: Cardiovascular;  Laterality: N/A;  . Esophagogastroduodenoscopy N/A 07/04/2012    Procedure: ESOPHAGOGASTRODUODENOSCOPY (EGD);  Surgeon: Inda Castle, MD;  Location: [place];  Service: Endoscopy;  Laterality: N/A;  . Cardioversion N/A 05/29/2013    Procedure: CARDIOVERSION;  Surgeon: Lelon Perla, MD;  Location: [facility] ENDOSCOPY;  Service: Cardiovascular;  Laterality: N/A;  . Cardioversion N/A 12/15/2013    Procedure: CARDIOVERSION;  Surgeon: Sueanne Margarita, MD;  Location: [facility] ENDOSCOPY;  Service: Cardiovascular;  Laterality: N/A;  . Cardioversion N/A 08/25/2014    Procedure: CARDIOVERSION;  Surgeon: Lelon Perla, MD;  Location: [facility] ENDOSCOPY;  Service: Cardiovascular;  Laterality: N/A;    Social History   Social History  . Marital Status: Married    Spouse Name: N/A  . Number of Children: 4  . Years of Education: N/A   Occupational History  . Not on file.   Social History Main Topics  . Smoking status: Former Smoker -- 0.25 packs/day for 7 years    Types: Cigarettes    Start date: 05/28/1975    Quit date: 05/27/1981  . Smokeless tobacco: Never Used     Comment: quit 25 years ago  . Alcohol Use: 0.0 oz/week    0 Standard drinks or equivalent per week     Comment: wine, occasionally  . Drug Use: No  . Sexual Activity: Not on file   Other Topics Concern  . Not on file   Social History Narrative   Pt lives in [place] with wife.  3 children ages 14,20,22 (all 3 are adopted)   Sales grinding wheels.    ROS: no fevers or chills, productive cough, hemoptysis, dysphasia, odynophagia, melena, hematochezia, dysuria, hematuria, rash, seizure activity, orthopnea, PND, pedal edema, claudication. Remaining systems are negative.  Physical Exam: Well-developed well-nourished in no acute distress.  Skin is warm and dry.  HEENT is normal.  Neck is supple.  Chest is clear to auscultation with normal expansion.  Cardiovascular exam is irregular; crisp mechanical  valve Abdominal exam nontender or distended. No masses palpated. Extremities show trace edema. neuro grossly intact  Electrocardiogram shows atrial fibrillation at a rate of 79. Normal axis. Incomplete left bundle branch block.

## 2014-12-30 ENCOUNTER — Telehealth: Payer: Self-pay | Admitting: *Deleted

## 2014-12-30 ENCOUNTER — Ambulatory Visit (INDEPENDENT_AMBULATORY_CARE_PROVIDER_SITE_OTHER): Payer: BLUE CROSS/BLUE SHIELD | Admitting: Cardiology

## 2014-12-30 ENCOUNTER — Encounter: Payer: Self-pay | Admitting: Cardiology

## 2014-12-30 VITALS — BP 93/56 | HR 83 | Ht 72.0 in | Wt 306.2 lb

## 2014-12-30 DIAGNOSIS — I480 Paroxysmal atrial fibrillation: Secondary | ICD-10-CM | POA: Diagnosis not present

## 2014-12-30 DIAGNOSIS — N183 Chronic kidney disease, stage 3 unspecified: Secondary | ICD-10-CM

## 2014-12-30 DIAGNOSIS — I712 Thoracic aortic aneurysm, without rupture, unspecified: Secondary | ICD-10-CM

## 2014-12-30 DIAGNOSIS — Z952 Presence of prosthetic heart valve: Secondary | ICD-10-CM

## 2014-12-30 DIAGNOSIS — Z954 Presence of other heart-valve replacement: Secondary | ICD-10-CM | POA: Diagnosis not present

## 2014-12-30 DIAGNOSIS — I10 Essential (primary) hypertension: Secondary | ICD-10-CM

## 2014-12-30 DIAGNOSIS — Q251 Coarctation of aorta: Secondary | ICD-10-CM

## 2014-12-30 NOTE — Assessment & Plan Note (Signed)
Continue Coumadin and SBE prophylaxis.

## 2014-12-30 NOTE — Patient Instructions (Signed)
Your physician recommends that you schedule a follow-up appointment in: [place]

## 2014-12-30 NOTE — Assessment & Plan Note (Signed)
Blood pressure is running low now that dialysis has been initiated.discontinue clonidine. Discontinue ARB. He will follow his blood pressure and we will reduce remaining medications as needed.

## 2014-12-30 NOTE — Assessment & Plan Note (Addendum)
Previous noncontrast chest CT at [facility] 5/16 showed 5.7 x 5.8 cm aneurysm. This is similar to previous measurements. He was told previously at [facility] that this would be followed as the risk of rupture would be less likely with scar tissue. He does not plan to follow-up at [facility] clinic. He will arrange evaluation at [facility]. We will avoid MRA given renal insufficiency. Plan follow-up noncontrast CT May 2017.

## 2014-12-30 NOTE — Assessment & Plan Note (Signed)
Plan follow-up CT in the future.

## 2014-12-30 NOTE — Assessment & Plan Note (Signed)
Patient now on dialysis. This is managed by nephrology. Peritoneal dialysis catheter to be placed soon.

## 2014-12-30 NOTE — Assessment & Plan Note (Signed)
Patient has developed recurrent atrial fibrillation. Continue Coumadin. He is symptomatic and I would like to reestablish sinus rhythm if possible. However he is scheduled for dialysis catheter placement for peritoneal dialysis and hernia repair in late September. His Coumadin will need to be interrupted for this. We will therefore allow him to have his procedure and then resume Coumadin. We will then cardiovert 3 weeks after INR therapeutic. I will arrange an evaluation by Dr. Rayann Heman to see if there were any strategies to help maintain sinus rhythm. He is not a candidate for amiodarone as he has hepatitis C and medications used to treat this interact with amiodarone based on notes from [facility]. He is not a candidate for Tikosyn or sotalol given renal failure. He would not be a good candidate for class IC agent given structural heart disease. Hopefully Dr. Rayann Heman will have thoughts about options.

## 2014-12-30 NOTE — Telephone Encounter (Signed)
Left detailed message for pt on personal voicemail.

## 2014-12-30 NOTE — Telephone Encounter (Signed)
Nephrogist, Dr. Olivia Mackie said the following when I texted that Dr. Stanford Breed wants me to eliminate clonidine and Benicar "Would prefer to keep Benicar and labetalol. Off clonidine and norvasc."Can Dr. Stanford Breed and Dr Olivia Mackie [facility] on [address] in [facility]) communicate on this issue and notify me how to proceed? Thanks Respectfully, Jakobi Darrisaw Cell - [phone] Will forward for dr Stanford Breed review

## 2014-12-30 NOTE — Telephone Encounter (Signed)
Ok to dc clonidine and norvasc and continue benicar and labetalol Kirk Ruths

## 2015-01-12 ENCOUNTER — Telehealth: Payer: Self-pay | Admitting: Hematology & Oncology

## 2015-01-12 NOTE — Telephone Encounter (Signed)
Faxed medical records to:  [facility] P: [id number] [phone] F: [phone]   Case: [id number]     COPY SCANNED

## 2015-01-20 NOTE — Addendum Note (Signed)
Addended by: Diana Eves on: 01/20/2015 01:48 PM   Modules accepted: Orders

## 2015-01-25 ENCOUNTER — Encounter: Payer: Self-pay | Admitting: Internal Medicine

## 2015-02-01 ENCOUNTER — Institutional Professional Consult (permissible substitution): Payer: BLUE CROSS/BLUE SHIELD | Admitting: Internal Medicine

## 2015-02-15 ENCOUNTER — Encounter: Payer: Self-pay | Admitting: Cardiology

## 2015-02-15 ENCOUNTER — Encounter: Payer: Self-pay | Admitting: *Deleted

## 2015-02-15 ENCOUNTER — Telehealth: Payer: Self-pay | Admitting: *Deleted

## 2015-02-15 ENCOUNTER — Other Ambulatory Visit: Payer: Self-pay | Admitting: Cardiology

## 2015-02-15 ENCOUNTER — Other Ambulatory Visit: Payer: Self-pay | Admitting: *Deleted

## 2015-02-15 DIAGNOSIS — I4891 Unspecified atrial fibrillation: Secondary | ICD-10-CM

## 2015-02-15 LAB — PROTIME-INR

## 2015-02-15 NOTE — Telephone Encounter (Signed)
Received an email from the pt, he is having a lot of fatigue. He has been changed to peritoneal dialysis and now feels the atrial fib maybe causing the proble,. He requested to have DCCV. Discussed with dr Stanford Breed, DCCV will be Wednesday this week, 02-17-15 @ 1 pm with dr Aundra Dubin. Left message for pt to call to discuss instructions.

## 2015-02-15 NOTE — Telephone Encounter (Signed)
Spoke with pt, instructions for DCCV discussed with the pt.

## 2015-02-15 NOTE — Progress Notes (Signed)
12/30/2014 10:30 AM  Office Visit  MRN:  [id number]   Description: Male DOB: 1955-11-17  Provider: Lelon Perla, MD  Department: [facility]       Vital Signs  Most recent update: 12/30/2014 10:31 AM by Garret Reddish, CMA    BP Pulse Ht Wt BMI    93/56 mmHg 83 6' (1.829 m) 138.891 kg (306 lb 3.2 oz) 41.52 kg/m2    Vitals History     Progress Notes      Lelon Perla, MD at 12/29/2014 9:50 AM     Status: Signed       Expand All Collapse All        HPI: FU paroxysmal atrial fibrillation/flutter, s/p aortic valve replacement, aortic coarctation, aortic aneurysm, coronary artery disease on CT scan and CKD. Patient is s/p coarctation repair in 1958 and redo in 1971. Had endovascular stent in 1995 and 1996. Had aortic balloon valvuloplasty in 1974 and AVR in 1994. 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 Stent in distal arch, proximal descending aorta patent. Mildly enlarged lymph nodes. He was followed at the [facility] clinic on a yearly basis but now plans fu at [facility] or [place] is to obtain Chest CT without contrast 5/17 to survey his thoracic aorta. Echocardiogram July 2016 showed normal LV function, severe left ventricular hypertrophy, prior aortic valve replacement with mean gradient 17 mmHg, severe left atrial enlargement.  Also with h/o recurrent symptomatic atrial fibrillation. Repeat DCCV 4/16.Had chest CT 5/16 in [facility] that showed his aorta was 5.7 x 5.8 cm. He also has been diagnosed with hepatitis C which will require therapy in the future. The gastroenterologists at [facility] recommended against amiodarone given the potential interaction with medications used to treat hepatitis C. He is now on dialysis. Since last seen, patient noted that approximately 2 weeks ago he went back into atrial fibrillation. He has some dyspnea on exertion but no orthopnea, PND, pedal edema, chest pain, syncope or  bleeding.  Current Outpatient Prescriptions  Medication Sig Dispense Refill  . amLODipine (NORVASC) 10 MG tablet TAKE 1 TABLET (10 MG TOTAL) BY MOUTH DAILY. 90 tablet 3  . amoxicillin (AMOXIL) 500 MG capsule Take 500 mg by mouth daily as needed (1 hour prior to dental appts).     . BENICAR 40 MG tablet TAKE 1 TABLET (40 MG TOTAL) BY MOUTH DAILY. 90 tablet 1  . cloNIDine (CATAPRES) 0.1 MG tablet Take 1 tablet (0.1 mg total) by mouth daily. 270 tablet 0  . COLCRYS 0.6 MG tablet Take 0.6 mg by mouth as needed (gout).     . furosemide (LASIX) 40 MG tablet Take 40 mg by mouth 2 (two) times daily.     Marland Kitchen HYDROcodone-acetaminophen (NORCO/VICODIN) 5-325 MG per tablet Take 1 tablet by mouth every 6 (six) hours as needed for moderate pain or severe pain.   0  . labetalol (NORMODYNE) 200 MG tablet 2 tabs am 1 tab pm (Patient taking differently: Take 200 mg by mouth 3 (three) times daily. 2 tabs am 1 tab pm) 270 tablet 0  . pantoprazole (PROTONIX) 40 MG tablet Take 40 mg by mouth 3 (three) times a week. Takes on Monday, Wednesday, and Friday    . sevelamer carbonate (RENVELA) 800 MG tablet Take 800 mg by mouth 3 (three) times daily with meals.    . Testosterone 40.5 MG/2.5GM (1.62%) GEL 2 pumps onto thighs daily 75 g 3  . warfarin (COUMADIN) 7.5  MG tablet Take 3.75-7.5 mg by mouth daily. Take 3.75 on four days of the week and 7.5 on all the other days     No current facility-administered medications for this visit.     Past Medical History  Diagnosis Date  . 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 HTN (hypertension)   . CAD (coronary artery disease)   . Aortic aneurysm   . Gout   . Atrial fibrillation   . Anemia   . Hyperlipidemia   . Atrial flutter   . Obesity   . Anemia of renal disease 10/04/2011  . CKD (chronic kidney disease)     baseline creatinine is 1.5-2  .  Gastric polyp 2007, 06/2012    benign.  . Colonic ulcer 2007    felt to be secondary to NSAID  . Testicular adenoma   . PONV (postoperative nausea and vomiting)     Past Surgical History  Procedure Laterality Date  . Coarctation of aorta repair  1958  . Coarctation of aorta repair  1970  . Aortic valve replacement and mitral valve repair  1974    Bicuspid aortic valve  . Orchiectomy  1992    testicular cancer  . Aortic valve replacement  1995    [facility]  . Tonsillectomy    . Cardioversion  02/27/2012    Procedure: CARDIOVERSION; Surgeon: Lelon Perla, MD; Location: [facility] ENDOSCOPY; Service: Cardiovascular; Laterality: N/A;  . Esophagogastroduodenoscopy N/A 07/04/2012    Procedure: ESOPHAGOGASTRODUODENOSCOPY (EGD); Surgeon: Inda Castle, MD; Location: [place]; Service: Endoscopy; Laterality: N/A;  . Cardioversion N/A 05/29/2013    Procedure: CARDIOVERSION; Surgeon: Lelon Perla, MD; Location: [facility] ENDOSCOPY; Service: Cardiovascular; Laterality: N/A;  . Cardioversion N/A 12/15/2013    Procedure: CARDIOVERSION; Surgeon: Sueanne Margarita, MD; Location: [facility] ENDOSCOPY; Service: Cardiovascular; Laterality: N/A;  . Cardioversion N/A 08/25/2014    Procedure: CARDIOVERSION; Surgeon: Lelon Perla, MD; Location: [facility] ENDOSCOPY; Service: Cardiovascular; Laterality: N/A;    Social History   Social History  . Marital Status: Married    Spouse Name: N/A  . Number of Children: 4  . Years of Education: N/A   Occupational History  . Not on file.   Social History Main Topics  . Smoking status: Former Smoker -- 0.25 packs/day for 7 years    Types: Cigarettes    Start date: 05/28/1975    Quit date: 05/27/1981  . Smokeless tobacco: Never Used     Comment: quit 25 years ago  . Alcohol Use: 0.0 oz/week    0 Standard  drinks or equivalent per week     Comment: wine, occasionally  . Drug Use: No  . Sexual Activity: Not on file   Other Topics Concern  . Not on file   Social History Narrative   Pt lives in [place] with wife. 3 children ages 71,20,22 (all 3 are adopted)   Sales grinding wheels.    ROS: no fevers or chills, productive cough, hemoptysis, dysphasia, odynophagia, melena, hematochezia, dysuria, hematuria, rash, seizure activity, orthopnea, PND, pedal edema, claudication. Remaining systems are negative.  Physical Exam: Well-developed well-nourished in no acute distress.  Skin is warm and dry.  HEENT is normal.  Neck is supple.  Chest is clear to auscultation with normal expansion.  Cardiovascular exam is irregular; crisp mechanical valve Abdominal exam nontender or distended. No masses palpated. Extremities show trace edema. neuro grossly intact  Electrocardiogram shows atrial fibrillation at a rate of 79. Normal axis.  Incomplete left bundle branch block.               Thoracic aortic aneurysm - Lelon Perla, MD at 12/30/2014 10:32 AM     Status: Alison Stalling Related Problem: Thoracic aortic aneurysm   Expand All Collapse All   Previous noncontrast chest CT at [facility] 5/16 showed 5.7 x 5.8 cm aneurysm. This is similar to previous measurements. He was told previously at [facility] that this would be followed as the risk of rupture would be less likely with scar tissue. He does not plan to follow-up at [facility] clinic. He will arrange evaluation at [facility]. We will avoid MRA given renal insufficiency. Plan follow-up noncontrast CT May 2017.         Revision History       Date/Time User Action    > 12/30/2014 11:02 AM Lelon Perla, MD Edit     12/30/2014 10:32 AM Lelon Perla, MD Create              S/P aortic valve replacement - Lelon Perla, MD at 12/30/2014 11:00 AM     Status: Written  Related Problem: S/P aortic valve replacement   Expand All Collapse All   Continue Coumadin and SBE prophylaxis.            Atrial fibrillation - Lelon Perla, MD at 12/30/2014 11:05 AM     Status: Written Related Problem: Atrial fibrillation   Expand All Collapse All   Patient has developed recurrent atrial fibrillation. Continue Coumadin. He is symptomatic and I would like to reestablish sinus rhythm if possible. However he is scheduled for dialysis catheter placement for peritoneal dialysis and hernia repair in late September. His Coumadin will need to be interrupted for this. We will therefore allow him to have his procedure and then resume Coumadin. We will then cardiovert 3 weeks after INR therapeutic. I will arrange an evaluation by Dr. Rayann Heman to see if there were any strategies to help maintain sinus rhythm. He is not a candidate for amiodarone as he has hepatitis C and medications used to treat this interact with amiodarone based on notes from [facility]. He is not a candidate for Tikosyn or sotalol given renal failure. He would not be a good candidate for class IC agent given structural heart disease. Hopefully Dr. Rayann Heman will have thoughts about options.            Coarctation of aorta (previous repair and stent) - Lelon Perla, MD at 12/30/2014 11:05 AM     Status: Written Related Problem: Coarctation of aorta (previous repair and stent)   Expand All Collapse All   Plan follow-up CT in the future.            Hypertension - Lelon Perla, MD at 12/30/2014 11:06 AM     Status: Written Related Problem: Hypertension   Expand All Collapse All   Blood pressure is running low now that dialysis has been initiated.discontinue clonidine. Discontinue ARB. He will follow his blood pressure and we will reduce remaining medications as needed.            CKD (chronic kidney disease) stage 3, GFR 30-59 ml/min - Lelon Perla, MD at 12/30/2014 11:07  AM     Status: Written Related Problem: CKD (chronic kidney disease) stage 3, GFR 30-59 ml/min   Expand All Collapse All   Patient now on dialysis. This is managed by nephrology. Peritoneal dialysis catheter to  be placed soon.       Patient does not feel well in atrial fibrillation; his INRs have been therapeutic; will arrange DCCV this week Kirk Ruths

## 2015-02-16 ENCOUNTER — Telehealth: Payer: Self-pay | Admitting: Cardiology

## 2015-02-16 LAB — PROTIME-INR
INR: 2.33 — ABNORMAL HIGH (ref <1.50)
Prothrombin Time: 25.9 seconds — ABNORMAL HIGH (ref 11.6–15.2)

## 2015-02-16 NOTE — Telephone Encounter (Signed)
STAT PT/INR received   PT 25.9 INR 2.33  Routed to [facility]

## 2015-02-16 NOTE — Telephone Encounter (Signed)
Spoke with patient, having DCCV tomorrow with Dr. Aundra Dubin.  Has previously had INRs followed thru dialysis center, but will be followed by Dr. Valora Piccolo in the future.  Patient aware of results.  Will forward to Dr. Aundra Dubin

## 2015-02-17 ENCOUNTER — Ambulatory Visit (HOSPITAL_COMMUNITY): Payer: BLUE CROSS/BLUE SHIELD | Admitting: Anesthesiology

## 2015-02-17 ENCOUNTER — Encounter (HOSPITAL_COMMUNITY)
Admission: RE | Disposition: A | Discharge status: Home / Self Care | Payer: Self-pay | Source: Ambulatory Visit | Attending: Cardiology

## 2015-02-17 ENCOUNTER — Ambulatory Visit (HOSPITAL_COMMUNITY)
Admission: RE | Admit: 2015-02-17 | Discharge: 2015-02-17 | Disposition: A | Discharge status: Home / Self Care | Payer: BLUE CROSS/BLUE SHIELD | Source: Ambulatory Visit | Attending: Cardiology | Admitting: Cardiology

## 2015-02-17 ENCOUNTER — Encounter (HOSPITAL_COMMUNITY): Payer: Self-pay | Admitting: *Deleted

## 2015-02-17 DIAGNOSIS — Z87891 Personal history of nicotine dependence: Secondary | ICD-10-CM | POA: Diagnosis not present

## 2015-02-17 DIAGNOSIS — D631 Anemia in chronic kidney disease: Secondary | ICD-10-CM | POA: Diagnosis not present

## 2015-02-17 DIAGNOSIS — I712 Thoracic aortic aneurysm, without rupture: Secondary | ICD-10-CM | POA: Diagnosis not present

## 2015-02-17 DIAGNOSIS — I480 Paroxysmal atrial fibrillation: Secondary | ICD-10-CM | POA: Diagnosis not present

## 2015-02-17 DIAGNOSIS — Z6841 Body Mass Index (BMI) 40.0 and over, adult: Secondary | ICD-10-CM | POA: Diagnosis not present

## 2015-02-17 DIAGNOSIS — I4891 Unspecified atrial fibrillation: Secondary | ICD-10-CM | POA: Diagnosis not present

## 2015-02-17 DIAGNOSIS — Z952 Presence of prosthetic heart valve: Secondary | ICD-10-CM | POA: Diagnosis not present

## 2015-02-17 DIAGNOSIS — I2510 Atherosclerotic heart disease of native coronary artery without angina pectoris: Secondary | ICD-10-CM | POA: Diagnosis not present

## 2015-02-17 DIAGNOSIS — Z79899 Other long term (current) drug therapy: Secondary | ICD-10-CM | POA: Insufficient documentation

## 2015-02-17 DIAGNOSIS — Z992 Dependence on renal dialysis: Secondary | ICD-10-CM | POA: Insufficient documentation

## 2015-02-17 DIAGNOSIS — Z79891 Long term (current) use of opiate analgesic: Secondary | ICD-10-CM | POA: Insufficient documentation

## 2015-02-17 DIAGNOSIS — N183 Chronic kidney disease, stage 3 (moderate): Secondary | ICD-10-CM | POA: Insufficient documentation

## 2015-02-17 DIAGNOSIS — E785 Hyperlipidemia, unspecified: Secondary | ICD-10-CM | POA: Insufficient documentation

## 2015-02-17 DIAGNOSIS — Z7901 Long term (current) use of anticoagulants: Secondary | ICD-10-CM | POA: Insufficient documentation

## 2015-02-17 DIAGNOSIS — I129 Hypertensive chronic kidney disease with stage 1 through stage 4 chronic kidney disease, or unspecified chronic kidney disease: Secondary | ICD-10-CM | POA: Insufficient documentation

## 2015-02-17 DIAGNOSIS — B1920 Unspecified viral hepatitis C without hepatic coma: Secondary | ICD-10-CM | POA: Diagnosis not present

## 2015-02-17 DIAGNOSIS — M109 Gout, unspecified: Secondary | ICD-10-CM | POA: Diagnosis not present

## 2015-02-17 HISTORY — PX: CARDIOVERSION: SHX1299

## 2015-02-17 LAB — POCT I-STAT 4, (NA,K, GLUC, HGB,HCT)
GLUCOSE: 93 mg/dL (ref 65–99)
HEMATOCRIT: 31 % — AB (ref 39.0–52.0)
HEMOGLOBIN: 10.5 g/dL — AB (ref 13.0–17.0)
POTASSIUM: 4.2 mmol/L (ref 3.5–5.1)
Sodium: 140 mmol/L (ref 135–145)

## 2015-02-17 SURGERY — CARDIOVERSION
Anesthesia: Monitor Anesthesia Care

## 2015-02-17 MED ORDER — SODIUM CHLORIDE 0.9 % IV SOLN
INTRAVENOUS | Status: DC
  Administered 2015-02-17: 12:00:00 via INTRAVENOUS

## 2015-02-17 MED ORDER — PROPOFOL 10 MG/ML IV BOLUS
INTRAVENOUS | Status: DC | PRN
  Administered 2015-02-17: 120 mg via INTRAVENOUS

## 2015-02-17 NOTE — Interval H&P Note (Signed)
History and Physical Interval Note:  02/17/2015 12:58 PM  Calvin [name]  has presented today for surgery, with the diagnosis of AFIB  The various methods of treatment have been discussed with the patient and family. After consideration of risks, benefits and other options for treatment, the patient has consented to  Procedure(s): CARDIOVERSION (N/A) as a surgical intervention .  The patient's history has been reviewed, patient examined, no change in status, stable for surgery.  I have reviewed the patient's chart and labs.  Questions were answered to the patient's satisfaction.     Chassity Ludke [employer]

## 2015-02-17 NOTE — Anesthesia Postprocedure Evaluation (Signed)
  Anesthesia Post-op Note  Patient: Calvin [name]  Procedure(s) Performed: Procedure(s) (LRB): CARDIOVERSION (N/A)  Patient Location: PACU  Anesthesia Type: General  Level of Consciousness: awake and alert   Airway and Oxygen Therapy: Patient Spontanous Breathing  Post-op Pain: mild  Post-op Assessment: Post-op Vital signs reviewed, Patient's Cardiovascular Status Stable, Respiratory Function Stable, Patent Airway and No signs of Nausea or vomiting  Last Vitals:  Filed Vitals:   02/17/15 1310  BP: 107/38  Pulse: 59  Temp:   Resp: 24    Post-op Vital Signs: stable   Complications: No apparent anesthesia complications

## 2015-02-17 NOTE — Procedures (Signed)
Electrical Cardioversion Procedure Note [name] [id number] [birth year]  Procedure: Electrical Cardioversion Indications:  Atrial Fibrillation  Procedure Details Consent: Risks of procedure as well as the alternatives and risks of each were explained to the (patient/caregiver).  Consent for procedure obtained. Time Out: Verified patient identification, verified procedure, site/side was marked, verified correct patient position, special equipment/implants available, medications/allergies/relevent history reviewed, required imaging and test results available.  Performed  Patient placed on cardiac monitor, pulse oximetry, supplemental oxygen as necessary.  Sedation given: Propofol per anesthesiology Pacer pads placed anterior and posterior chest.  Cardioverted 1 time(s).  Cardioverted at [place].  Evaluation Findings: Post procedure EKG shows: NSR with PACs.  Complications: None Patient did tolerate procedure well.   [name] 02/17/2015, 1:02 PM

## 2015-02-17 NOTE — Transfer of Care (Signed)
Immediate Anesthesia Transfer of Care Note  Patient: Calvin [name]  Procedure(s) Performed: Procedure(s): CARDIOVERSION (N/A)  Patient Location: Endoscopy Unit  Anesthesia Type:MAC  Level of Consciousness: awake, alert  and oriented  Airway & Oxygen Therapy: Patient Spontanous Breathing and Patient connected to nasal cannula oxygen  Post-op Assessment: Report given to RN and Post -op Vital signs reviewed and stable  Post vital signs: Reviewed and stable  Last Vitals:  Filed Vitals:   02/17/15 1145  BP: 138/74  Pulse: 83  Temp: 36.3 C  Resp: 14    Complications: No apparent anesthesia complications

## 2015-02-17 NOTE — Discharge Instructions (Signed)
Electrical Cardioversion, Care After °Refer to this sheet in the next few weeks. These instructions provide you with information on caring for yourself after your procedure. Your health care provider may also give you more specific instructions. Your treatment has been planned according to current medical practices, but problems sometimes occur. Call your health care provider if you have any problems or questions after your procedure. °WHAT TO EXPECT AFTER THE PROCEDURE °After your procedure, it is typical to have the following sensations: °· Some redness on the skin where the shocks were delivered. If this is tender, a sunburn lotion or hydrocortisone cream may help. °· Possible return of an abnormal heart rhythm within hours or days after the procedure. °HOME CARE INSTRUCTIONS °· Take medicines only as directed by your health care provider. Be sure you understand how and when to take your medicine. °· Learn how to feel your pulse and check it often. °· Limit your activity for 48 hours after the procedure or as directed by your health care provider. °· Avoid or minimize caffeine and other stimulants as directed by your health care provider. °SEEK MEDICAL CARE IF: °· You feel like your heart is beating too fast or your pulse is not regular. °· You have any questions about your medicines. °· You have bleeding that will not stop. °SEEK IMMEDIATE MEDICAL CARE IF: °· You are dizzy or feel faint. °· It is hard to breathe or you feel short of breath. °· There is a change in discomfort in your chest. °· Your speech is slurred or you have trouble moving an arm or leg on one side of your body. °· You get a serious muscle cramp that does not go away. °· Your fingers or toes turn cold or blue. °  °This information is not intended to replace advice given to you by your health care provider. Make sure you discuss any questions you have with your health care provider. °  °Document Released: 02/05/2013 Document Revised: 05/08/2014  Document Reviewed: 02/05/2013 °Elsevier Interactive Patient Education ©2016 [employer] Inc[employer] ° °

## 2015-02-17 NOTE — H&P (View-Only) (Signed)
12/30/2014 10:30 AM  Office Visit  MRN:  [id number]   Description: Male DOB: 04-12-1956  Provider: Lelon Perla, MD  Department: [facility]       Vital Signs  Most recent update: 12/30/2014 10:31 AM by Garret Reddish, CMA    BP Pulse Ht Wt BMI    93/56 mmHg 83 6' (1.829 m) 138.891 kg (306 lb 3.2 oz) 41.52 kg/m2    Vitals History     Progress Notes      Lelon Perla, MD at 12/29/2014 9:50 AM     Status: Signed       Expand All Collapse All        HPI: FU paroxysmal atrial fibrillation/flutter, s/p aortic valve replacement, aortic coarctation, aortic aneurysm, coronary artery disease on CT scan and CKD. Patient is s/p coarctation repair in 1958 and redo in 1971. Had endovascular stent in 1995 and 1996. Had aortic balloon valvuloplasty in 1974 and AVR in 1994. 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 Stent in distal arch, proximal descending aorta patent. Mildly enlarged lymph nodes. He was followed at the [facility] clinic on a yearly basis but now plans fu at [facility] or [place] is to obtain Chest CT without contrast 5/17 to survey his thoracic aorta. Echocardiogram July 2016 showed normal LV function, severe left ventricular hypertrophy, prior aortic valve replacement with mean gradient 17 mmHg, severe left atrial enlargement.  Also with h/o recurrent symptomatic atrial fibrillation. Repeat DCCV 4/16.Had chest CT 5/16 in [facility] that showed his aorta was 5.7 x 5.8 cm. He also has been diagnosed with hepatitis C which will require therapy in the future. The gastroenterologists at [name] recommended against amiodarone given the potential interaction with medications used to treat hepatitis C. He is now on dialysis. Since last seen, patient noted that approximately 2 weeks ago he went back into atrial fibrillation. He has some dyspnea on exertion but no orthopnea, PND, pedal edema, chest pain, syncope or  bleeding.  Current Outpatient Prescriptions  Medication Sig Dispense Refill  . amLODipine (NORVASC) 10 MG tablet TAKE 1 TABLET (10 MG TOTAL) BY MOUTH DAILY. 90 tablet 3  . amoxicillin (AMOXIL) 500 MG capsule Take 500 mg by mouth daily as needed (1 hour prior to dental appts).     . BENICAR 40 MG tablet TAKE 1 TABLET (40 MG TOTAL) BY MOUTH DAILY. 90 tablet 1  . cloNIDine (CATAPRES) 0.1 MG tablet Take 1 tablet (0.1 mg total) by mouth daily. 270 tablet 0  . COLCRYS 0.6 MG tablet Take 0.6 mg by mouth as needed (gout).     . furosemide (LASIX) 40 MG tablet Take 40 mg by mouth 2 (two) times daily.     Marland Kitchen HYDROcodone-acetaminophen (NORCO/VICODIN) 5-325 MG per tablet Take 1 tablet by mouth every 6 (six) hours as needed for moderate pain or severe pain.   0  . labetalol (NORMODYNE) 200 MG tablet 2 tabs am 1 tab pm (Patient taking differently: Take 200 mg by mouth 3 (three) times daily. 2 tabs am 1 tab pm) 270 tablet 0  . pantoprazole (PROTONIX) 40 MG tablet Take 40 mg by mouth 3 (three) times a week. Takes on Monday, Wednesday, and Friday    . sevelamer carbonate (RENVELA) 800 MG tablet Take 800 mg by mouth 3 (three) times daily with meals.    . Testosterone 40.5 MG/2.5GM (1.62%) GEL 2 pumps onto thighs daily 75 g 3  . warfarin (COUMADIN) 7.5  MG tablet Take 3.75-7.5 mg by mouth daily. Take 3.75 on four days of the week and 7.5 on all the other days     No current facility-administered medications for this visit.     Past Medical History  Diagnosis Date  . 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 HTN (hypertension)   . CAD (coronary artery disease)   . Aortic aneurysm   . Gout   . Atrial fibrillation   . Anemia   . Hyperlipidemia   . Atrial flutter   . Obesity   . Anemia of renal disease 10/04/2011  . CKD (chronic kidney disease)     baseline creatinine is 1.5-2  .  Gastric polyp 2007, 06/2012    benign.  . Colonic ulcer 2007    felt to be secondary to NSAID  . Testicular adenoma   . PONV (postoperative nausea and vomiting)     Past Surgical History  Procedure Laterality Date  . Coarctation of aorta repair  1958  . Coarctation of aorta repair  1970  . Aortic valve replacement and mitral valve repair  1974    Bicuspid aortic valve  . Orchiectomy  1992    testicular cancer  . Aortic valve replacement  1995    [facility]  . Tonsillectomy    . Cardioversion  02/27/2012    Procedure: CARDIOVERSION; Surgeon: Lelon Perla, MD; Location: [facility] ENDOSCOPY; Service: Cardiovascular; Laterality: N/A;  . Esophagogastroduodenoscopy N/A 07/04/2012    Procedure: ESOPHAGOGASTRODUODENOSCOPY (EGD); Surgeon: Inda Castle, MD; Location: [place]; Service: Endoscopy; Laterality: N/A;  . Cardioversion N/A 05/29/2013    Procedure: CARDIOVERSION; Surgeon: Lelon Perla, MD; Location: [facility] ENDOSCOPY; Service: Cardiovascular; Laterality: N/A;  . Cardioversion N/A 12/15/2013    Procedure: CARDIOVERSION; Surgeon: Sueanne Margarita, MD; Location: [facility] ENDOSCOPY; Service: Cardiovascular; Laterality: N/A;  . Cardioversion N/A 08/25/2014    Procedure: CARDIOVERSION; Surgeon: Lelon Perla, MD; Location: [facility] ENDOSCOPY; Service: Cardiovascular; Laterality: N/A;    Social History   Social History  . Marital Status: Married    Spouse Name: N/A  . Number of Children: 4  . Years of Education: N/A   Occupational History  . Not on file.   Social History Main Topics  . Smoking status: Former Smoker -- 0.25 packs/day for 7 years    Types: Cigarettes    Start date: 05/28/1975    Quit date: 05/27/1981  . Smokeless tobacco: Never Used     Comment: quit 25 years ago  . Alcohol Use: 0.0 oz/week    0 Standard  drinks or equivalent per week     Comment: wine, occasionally  . Drug Use: No  . Sexual Activity: Not on file   Other Topics Concern  . Not on file   Social History Narrative   Pt lives in [place] with wife. 3 children ages 57,20,22 (all 3 are adopted)   Sales grinding wheels.    ROS: no fevers or chills, productive cough, hemoptysis, dysphasia, odynophagia, melena, hematochezia, dysuria, hematuria, rash, seizure activity, orthopnea, PND, pedal edema, claudication. Remaining systems are negative.  Physical Exam: Well-developed well-nourished in no acute distress.  Skin is warm and dry.  HEENT is normal.  Neck is supple.  Chest is clear to auscultation with normal expansion.  Cardiovascular exam is irregular; crisp mechanical valve Abdominal exam nontender or distended. No masses palpated. Extremities show trace edema. neuro grossly intact  Electrocardiogram shows atrial fibrillation at a rate of 79. Normal axis.  Incomplete left bundle branch block.               Thoracic aortic aneurysm - Lelon Perla, MD at 12/30/2014 10:32 AM     Status: Alison Stalling Related Problem: Thoracic aortic aneurysm   Expand All Collapse All   Previous noncontrast chest CT at [facility] 5/16 showed 5.7 x 5.8 cm aneurysm. This is similar to previous measurements. He was told previously at [facility] that this would be followed as the risk of rupture would be less likely with scar tissue. He does not plan to follow-up at [facility] clinic. He will arrange evaluation at [facility] We will avoid MRA given renal insufficiency. Plan follow-up noncontrast CT May 2017.         Revision History       Date/Time User Action    > 12/30/2014 11:02 AM Lelon Perla, MD Edit     12/30/2014 10:32 AM Lelon Perla, MD Create              S/P aortic valve replacement - Lelon Perla, MD at 12/30/2014 11:00 AM     Status: Written  Related Problem: S/P aortic valve replacement   Expand All Collapse All   Continue Coumadin and SBE prophylaxis.            Atrial fibrillation - Lelon Perla, MD at 12/30/2014 11:05 AM     Status: Written Related Problem: Atrial fibrillation   Expand All Collapse All   Patient has developed recurrent atrial fibrillation. Continue Coumadin. He is symptomatic and I would like to reestablish sinus rhythm if possible. However he is scheduled for dialysis catheter placement for peritoneal dialysis and hernia repair in late September. His Coumadin will need to be interrupted for this. We will therefore allow him to have his procedure and then resume Coumadin. We will then cardiovert 3 weeks after INR therapeutic. I will arrange an evaluation by Dr. Rayann Heman to see if there were any strategies to help maintain sinus rhythm. He is not a candidate for amiodarone as he has hepatitis C and medications used to treat this interact with amiodarone based on notes from [facility]. He is not a candidate for Tikosyn or sotalol given renal failure. He would not be a good candidate for class IC agent given structural heart disease. Hopefully Dr. Rayann Heman will have thoughts about options.            Coarctation of aorta (previous repair and stent) - Lelon Perla, MD at 12/30/2014 11:05 AM     Status: Written Related Problem: Coarctation of aorta (previous repair and stent)   Expand All Collapse All   Plan follow-up CT in the future.            Hypertension - Lelon Perla, MD at 12/30/2014 11:06 AM     Status: Written Related Problem: Hypertension   Expand All Collapse All   Blood pressure is running low now that dialysis has been initiated.discontinue clonidine. Discontinue ARB. He will follow his blood pressure and we will reduce remaining medications as needed.            CKD (chronic kidney disease) stage 3, GFR 30-59 ml/min - Lelon Perla, MD at 12/30/2014 11:07  AM     Status: Written Related Problem: CKD (chronic kidney disease) stage 3, GFR 30-59 ml/min   Expand All Collapse All   Patient now on dialysis. This is managed by nephrology. Peritoneal dialysis catheter to  be placed soon.       Patient does not feel well in atrial fibrillation; his INRs have been therapeutic; will arrange DCCV this week Kirk [facility]s

## 2015-02-17 NOTE — Anesthesia Preprocedure Evaluation (Addendum)
Anesthesia Evaluation  Patient identified by MRN, date of birth, ID band Patient awake    Reviewed: Allergy & Precautions, NPO status , Patient's Chart, lab work & pertinent test results  History of Anesthesia Complications (+) PONV  Airway Mallampati: III  TM Distance: >3 FB     Dental   Pulmonary former smoker,    breath sounds clear to auscultation       Cardiovascular hypertension, + CAD   Rhythm:Irregular Rate:Normal     Neuro/Psych    GI/Hepatic PUD,   Endo/Other    Renal/GU DialysisRenal disease     Musculoskeletal   Abdominal   Peds  Hematology  (+) anemia ,   Anesthesia Other Findings   Reproductive/Obstetrics                            Anesthesia Physical Anesthesia Plan  ASA: III  Anesthesia Plan: MAC   Post-op Pain Management:    Induction: Intravenous  Airway Management Planned: Mask  Additional Equipment:   Intra-op Plan:   Post-operative Plan:   Informed Consent: I have reviewed the patients History and Physical, chart, labs and discussed the procedure including the risks, benefits and alternatives for the proposed anesthesia with the patient or authorized representative who has indicated his/her understanding and acceptance.   Dental advisory given  Plan Discussed with: Anesthesiologist and CRNA  Anesthesia Plan Comments:        Anesthesia Quick Evaluation                                  Anesthesia Evaluation  Patient identified by MRN, date of birth, ID band Patient awake    Reviewed: Allergy & Precautions, H&P , NPO status , Patient's Chart, lab work & pertinent test results  History of Anesthesia Complications (+) PONV and history of anesthetic complications  Airway Mallampati: I  TM Distance: >3 FB Neck ROM: Full    Dental  (+) Dental Advisory Given   Pulmonary former smoker,  breath sounds clear to auscultation        Cardiovascular hypertension, Pt. on medications + CAD and + Peripheral Vascular Disease + dysrhythmias Atrial Fibrillation Rhythm:Irregular  Echo 4/13 EF 55-60%, mild LVH S/p aortic valve repair, coarctation of aorta   Neuro/Psych negative neurological ROS  negative psych ROS   GI/Hepatic PUD,   Endo/Other  Morbid obesity  Renal/GU Renal InsufficiencyRenal disease     Musculoskeletal   Abdominal   Peds  Hematology   Anesthesia Other Findings   Reproductive/Obstetrics                             Anesthesia Physical  Anesthesia Plan  ASA: III  Anesthesia Plan: MAC   Post-op Pain Management:    Induction: Intravenous  Airway Management Planned: Mask  Additional Equipment: None  Intra-op Plan:   Post-operative Plan:   Informed Consent: I have reviewed the patients History and Physical, chart, labs and discussed the procedure including the risks, benefits and alternatives for the proposed anesthesia with the patient or authorized representative who has indicated his/her understanding and acceptance.   Dental advisory given  Plan Discussed with: Surgeon and CRNA  Anesthesia Plan Comments:         Anesthesia Quick Evaluation

## 2015-02-18 ENCOUNTER — Encounter (HOSPITAL_COMMUNITY): Payer: Self-pay | Admitting: Cardiology

## 2015-02-23 ENCOUNTER — Emergency Department (HOSPITAL_COMMUNITY): Payer: BLUE CROSS/BLUE SHIELD

## 2015-02-23 ENCOUNTER — Emergency Department (HOSPITAL_COMMUNITY)
Admission: EM | Admit: 2015-02-23 | Discharge: 2015-02-23 | Disposition: A | Discharge status: Home / Self Care | Payer: BLUE CROSS/BLUE SHIELD | Attending: Emergency Medicine | Admitting: Emergency Medicine

## 2015-02-23 ENCOUNTER — Encounter (HOSPITAL_COMMUNITY): Payer: Self-pay | Admitting: Emergency Medicine

## 2015-02-23 DIAGNOSIS — Z8619 Personal history of other infectious and parasitic diseases: Secondary | ICD-10-CM | POA: Diagnosis not present

## 2015-02-23 DIAGNOSIS — Z8679 Personal history of other diseases of the circulatory system: Secondary | ICD-10-CM

## 2015-02-23 DIAGNOSIS — Q251 Coarctation of aorta: Secondary | ICD-10-CM | POA: Insufficient documentation

## 2015-02-23 DIAGNOSIS — Z862 Personal history of diseases of the blood and blood-forming organs and certain disorders involving the immune mechanism: Secondary | ICD-10-CM | POA: Insufficient documentation

## 2015-02-23 DIAGNOSIS — Z992 Dependence on renal dialysis: Secondary | ICD-10-CM | POA: Insufficient documentation

## 2015-02-23 DIAGNOSIS — I120 Hypertensive chronic kidney disease with stage 5 chronic kidney disease or end stage renal disease: Secondary | ICD-10-CM | POA: Diagnosis not present

## 2015-02-23 DIAGNOSIS — Z7901 Long term (current) use of anticoagulants: Secondary | ICD-10-CM | POA: Insufficient documentation

## 2015-02-23 DIAGNOSIS — I359 Nonrheumatic aortic valve disorder, unspecified: Secondary | ICD-10-CM

## 2015-02-23 DIAGNOSIS — B1920 Unspecified viral hepatitis C without hepatic coma: Secondary | ICD-10-CM | POA: Insufficient documentation

## 2015-02-23 DIAGNOSIS — I4891 Unspecified atrial fibrillation: Secondary | ICD-10-CM

## 2015-02-23 DIAGNOSIS — I10 Essential (primary) hypertension: Secondary | ICD-10-CM

## 2015-02-23 DIAGNOSIS — N186 End stage renal disease: Secondary | ICD-10-CM | POA: Diagnosis not present

## 2015-02-23 DIAGNOSIS — Z79899 Other long term (current) drug therapy: Secondary | ICD-10-CM | POA: Diagnosis not present

## 2015-02-23 DIAGNOSIS — R002 Palpitations: Secondary | ICD-10-CM | POA: Diagnosis present

## 2015-02-23 DIAGNOSIS — E785 Hyperlipidemia, unspecified: Secondary | ICD-10-CM | POA: Insufficient documentation

## 2015-02-23 DIAGNOSIS — Z87891 Personal history of nicotine dependence: Secondary | ICD-10-CM | POA: Insufficient documentation

## 2015-02-23 DIAGNOSIS — I719 Aortic aneurysm of unspecified site, without rupture: Secondary | ICD-10-CM | POA: Diagnosis not present

## 2015-02-23 DIAGNOSIS — E669 Obesity, unspecified: Secondary | ICD-10-CM | POA: Diagnosis not present

## 2015-02-23 DIAGNOSIS — I480 Paroxysmal atrial fibrillation: Secondary | ICD-10-CM | POA: Diagnosis not present

## 2015-02-23 DIAGNOSIS — M109 Gout, unspecified: Secondary | ICD-10-CM | POA: Insufficient documentation

## 2015-02-23 DIAGNOSIS — I2510 Atherosclerotic heart disease of native coronary artery without angina pectoris: Secondary | ICD-10-CM | POA: Diagnosis not present

## 2015-02-23 HISTORY — DX: Coarctation of aorta: Q25.1

## 2015-02-23 HISTORY — DX: Nonrheumatic aortic valve disorder, unspecified: I35.9

## 2015-02-23 HISTORY — DX: Unspecified viral hepatitis C without hepatic coma: B19.20

## 2015-02-23 LAB — MAGNESIUM: MAGNESIUM: 1.6 mg/dL — AB (ref 1.7–2.4)

## 2015-02-23 LAB — CBC
HEMATOCRIT: 31.2 % — AB (ref 39.0–52.0)
Hemoglobin: 10.4 g/dL — ABNORMAL LOW (ref 13.0–17.0)
MCH: 28.9 pg (ref 26.0–34.0)
MCHC: 33.3 g/dL (ref 30.0–36.0)
MCV: 86.7 fL (ref 78.0–100.0)
Platelets: 222 10*3/uL (ref 150–400)
RBC: 3.6 MIL/uL — AB (ref 4.22–5.81)
RDW: 16.9 % — ABNORMAL HIGH (ref 11.5–15.5)
WBC: 4.5 10*3/uL (ref 4.0–10.5)

## 2015-02-23 LAB — BASIC METABOLIC PANEL
Anion gap: 14 (ref 5–15)
BUN: 91 mg/dL — AB (ref 6–20)
CO2: 21 mmol/L — ABNORMAL LOW (ref 22–32)
CREATININE: 9.25 mg/dL — AB (ref 0.61–1.24)
Calcium: 8 mg/dL — ABNORMAL LOW (ref 8.9–10.3)
Chloride: 106 mmol/L (ref 101–111)
GFR calc Af Amer: 6 mL/min — ABNORMAL LOW (ref >60)
GFR calc non Af Amer: 6 mL/min — ABNORMAL LOW (ref >60)
Glucose, Bld: 106 mg/dL — ABNORMAL HIGH (ref 65–99)
POTASSIUM: 4.5 mmol/L (ref 3.5–5.1)
SODIUM: 141 mmol/L (ref 135–145)

## 2015-02-23 LAB — PROTIME-INR
INR: 2.84 — ABNORMAL HIGH (ref 0.00–1.49)
Prothrombin Time: 29.4 seconds — ABNORMAL HIGH (ref 11.6–15.2)

## 2015-02-23 LAB — PHOSPHORUS: PHOSPHORUS: 5.8 mg/dL — AB (ref 2.5–4.6)

## 2015-02-23 LAB — I-STAT TROPONIN, ED: TROPONIN I, POC: 0.05 ng/mL (ref 0.00–0.08)

## 2015-02-23 MED ORDER — METOPROLOL TARTRATE 1 MG/ML IV SOLN
5.0000 mg | INTRAVENOUS | Status: DC | PRN
  Filled 2015-02-23: qty 5

## 2015-02-23 MED ORDER — LABETALOL HCL 200 MG PO TABS
200.0000 mg | ORAL_TABLET | Freq: Once | ORAL | Status: AC
  Administered 2015-02-23: 200 mg via ORAL
  Filled 2015-02-23: qty 1

## 2015-02-23 NOTE — ED Notes (Signed)
Patient was brought back to room with family in tow; patient ambulated without any difficulty or distress; patient undressed, in gown, on monitor, continuous pulse oximetry and blood pressure cuff; visitor at bedside;also an urine sample was collected and at the bedside table

## 2015-02-23 NOTE — Consult Note (Signed)
Primary Physician: Primary Cardiologist:  Stanford Breed Was seen by Donetta Potts [facility] clinic  Last seen 2015) Appt with Dr Humphrey Rolls in next few wks [facility])    HPI:  Pt is a 59 yo with a history of atrial fib/flutter (s/p cardioversioin in past), AV disease (s/p valvuloplasty 74, AVR 11)) coarctation of aorta (s/p repair 1958, 1971, endovascular repair 1995, 1996) , CAD on CT scan.    Ascending aorta 5.7 x 5.8 cm).  Occluded L subcalavian.   Followed at [facility] clinic.   Last echo:  July 2016:  LVEF normal  Severe LVH.  Severe LAE.   Also a history of Hep C, ESRD (on dialysis)   Not felt to be candidate for amio due to hep C.  Not Tikosyn or sotalol candidate.  Pt was found to be back in atrial fib this summer  Set up for cardioversion which he had done on 10/19 (Delayed because of initiation of peritoneal dialysis)   SUccessful Last night developed palpitations  No SOB  No CP  No dizziness   Went to primary MD who sent him to ER      Past Medical History  Diagnosis Date  . Seminoma ([place]) 1992    s/p resection and radiation  . HTN (hypertension)   . CAD (coronary artery disease)   . Aortic aneurysm ([place])   . Gout   . Atrial fibrillation ([place])   . Anemia   . Hyperlipidemia   . Atrial flutter ([place])   . Obesity   . Anemia of renal disease 10/04/2011  . CKD (chronic kidney disease)     baseline creatinine is 1.5-2  . Gastric polyp 2007, 06/2012    benign.  . Colonic ulcer 2007    felt to be secondary to NSAID  . Testicular adenoma   . PONV (postoperative nausea and vomiting)   . Complex coarctation of the aorta   . AVD (aortic valve disease)   . Hepatitis C      (Not in a hospital admission)     Infusions:   Allergies  Allergen Reactions  . Ace Inhibitors Anxiety and Swelling    "Can tolerate Benicar".  Diffuse swelling No issues with Benicar Edema Diffuse swelling No issues with Benicar    Social History   Social History  . Marital Status: Married   Spouse Name: N/A  . Number of Children: 4  . Years of Education: N/A   Occupational History  . Not on file.   Social History Main Topics  . Smoking status: Former Smoker -- 0.25 packs/day for 7 years    Types: Cigarettes    Start date: 05/28/1975    Quit date: 05/27/1981  . Smokeless tobacco: Never Used     Comment: quit 25 years ago  . Alcohol Use: 0.0 oz/week    0 Standard drinks or equivalent per week     Comment: wine, occasionally  . Drug Use: No  . Sexual Activity: Not on file   Other Topics Concern  . Not on file   Social History Narrative   Pt lives in [place] with wife.  3 children ages 6,20,22 (all 3 are adopted)   Sales grinding wheels.    Family History  Problem Relation Age of Onset  . Heart disease      No family history  . Heart attack Neg Hx     REVIEW OF SYSTEMS:  All systems reviewed  Negative to the above problem except as noted above.  PHYSICAL EXAM: Filed Vitals:   02/23/15 1715  BP: 115/74  Pulse: 125  Temp:   Resp: 19    No intake or output data in the 24 hours ending 02/23/15 1737  General:  Well appearing. No respiratory difficulty HEENT: normal Neck: supple. No obvious JVD. Carotids 2+ bilat; no bruits. Cor: Irregular rate & rhythm. No rubs, gallops Crisp valve sounds Murmur of L 2 intercostal space. Lungs: clear Abdomen: soft, nontender, nondistended. No hepatosplenomegaly. No bruits or masses. Good bowel sounds. Extremities: no cyanosis, clubbing, rash,  Tr edema  Note radial/pedal delay Neuro: alert & oriented x 3, cranial nerves grossly intact. moves all 4 extremities w/o difficulty. Affect pleasant.  ECG:  Atrial fib 123 bpm  T wave inversion I, AVL   Results for orders placed or performed during the hospital encounter of 02/23/15 (from the past 24 hour(s))  I-stat troponin, ED     Status: None   Collection Time: 02/23/15  4:30 PM  Result Value Ref Range   Troponin i, poc 0.05 0.00 - 0.08 ng/mL   Comment 3            Basic metabolic panel     Status: Abnormal   Collection Time: 02/23/15  4:36 PM  Result Value Ref Range   Sodium 141 135 - 145 mmol/L   Potassium 4.5 3.5 - 5.1 mmol/L   Chloride 106 101 - 111 mmol/L   CO2 21 (L) 22 - 32 mmol/L   Glucose, Bld 106 (H) 65 - 99 mg/dL   BUN 91 (H) 6 - 20 mg/dL   Creatinine, Ser 9.25 (H) 0.61 - 1.24 mg/dL   Calcium 8.0 (L) 8.9 - 10.3 mg/dL   GFR calc non Af Amer 6 (L) >60 mL/min   GFR calc Af Amer 6 (L) >60 mL/min   Anion gap 14 5 - 15  CBC     Status: Abnormal   Collection Time: 02/23/15  4:36 PM  Result Value Ref Range   WBC 4.5 4.0 - 10.5 K/uL   RBC 3.60 (L) 4.22 - 5.81 MIL/uL   Hemoglobin 10.4 (L) 13.0 - 17.0 g/dL   HCT 31.2 (L) 39.0 - 52.0 %   MCV 86.7 78.0 - 100.0 fL   MCH 28.9 26.0 - 34.0 pg   MCHC 33.3 30.0 - 36.0 g/dL   RDW 16.9 (H) 11.5 - 15.5 %   Platelets 222 150 - 400 K/uL  Protime-INR     Status: Abnormal   Collection Time: 02/23/15  4:36 PM  Result Value Ref Range   Prothrombin Time 29.4 (H) 11.6 - 15.2 seconds   INR 2.84 (H) 0.00 - 1.49   No results found.   ASSESSMENT:  Pti ns a 59 yo with long cardiac history   Hx AV dz s/p AVR; Coarc of the aorta (s/p repair), asc aortic aneurysm, ESRD , Hep C  And afib  Cardioverted 10/17  Now back in afib   Hemodynamics--HR 100s to 120s  But BP OK  Patient asymptomatic  Recomm:  I have reviewed with B Crenshaw. Will increase labetalol to 300/200/300   Hold Benicar Will make appt with B Crenshaw tomorrow for BP / HR check.

## 2015-02-23 NOTE — ED Notes (Signed)
Pt sts hx of afib and started having afib last night; pt sts palpitations; pt denies pain or SOB; pt on PD at present

## 2015-02-23 NOTE — Discharge Instructions (Signed)
FOLLOW UP WITH YOUR CARDIOLOGIST TOMORROW. ALSO FOLLOW THEIR INSTRUCTIONS REGARDING MEDICATION REGIMEN CHANGES.

## 2015-02-23 NOTE — ED Notes (Signed)
Cardiology at bedside.

## 2015-02-23 NOTE — ED Notes (Signed)
Dr. Ashok Cordia at bedside. pts BP 112/50, Dr. Ashok Cordia aware and advised to hold metoprolol at this time.

## 2015-02-23 NOTE — ED Notes (Signed)
Patient d/c'd from IV, monitor, continuous pulse oximetry and blood pressure cuff; patient getting dressed to be discharged home; visitor at bedside

## 2015-02-23 NOTE — ED Notes (Signed)
Patient transported to X-ray 

## 2015-02-23 NOTE — ED Notes (Signed)
MD at bedside. 

## 2015-02-23 NOTE — ED Provider Notes (Signed)
CSN: [id number]     Arrival date & time 02/23/15  1540 History   First MD Initiated Contact with Patient 02/23/15 1625     Chief Complaint  Patient presents with  . Palpitations   HPI  Patient presents for intermittent palpitations of onset this morning. Denies any chest pain or shortness of breath however states "I knew I was in A. fib again". Patient has a past medical significant for several cardioversions due to known diagnosis of atrial fibrillation versus a flutter. Patient's last cardioversion was performed this past Wednesday no cough occasions. Patient is maintained on warfarin and states he is compliant with regimen. Also takes labetalol 200 mg twice a day and Benicar 40 mg daily. Patient has been on hemodialysis for some time due to end-stage renal disease however was recently switched to peritoneal dialysis for the past 3 weeks states he is tolerating it well. Patient has past medical significant for aortic valvular replacement with artificial valve coarctation of aorta and an ascending aortic aneurysm that has been observed for several years and found to be stable. Patient also had testicular cancer that has been treated and states that he is currently in remission.  Past Medical History  Diagnosis Date  . 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 HTN (hypertension)   . CAD (coronary artery disease)   . Aortic aneurysm ([place])   . Gout   . Atrial fibrillation ([place])   . Anemia   . Hyperlipidemia   . Atrial flutter ([place])   . Obesity   . Anemia of renal disease 10/04/2011  . CKD (chronic kidney disease)     baseline creatinine is 1.5-2  . Gastric polyp 2007, 06/2012    benign.  . Colonic ulcer 2007    felt to be secondary to NSAID  . Testicular adenoma   . PONV (postoperative nausea and vomiting)   . Complex coarctation of the aorta   . AVD (aortic valve disease)   . Hepatitis C    Past Surgical History  Procedure Laterality Date  . Coarctation of aorta repair   1958  . Coarctation of aorta repair  1970  . Aortic valve replacement and mitral valve repair  1974    Bicuspid aortic valve  . Orchiectomy  1992    testicular cancer  . Aortic valve replacement  1995    [facility]  . Tonsillectomy    . Cardioversion  02/27/2012    Procedure: CARDIOVERSION;  Surgeon: Lelon Perla, MD;  Location: [facility] ENDOSCOPY;  Service: Cardiovascular;  Laterality: N/A;  . Esophagogastroduodenoscopy N/A 07/04/2012    Procedure: ESOPHAGOGASTRODUODENOSCOPY (EGD);  Surgeon: Inda Castle, MD;  Location: [place];  Service: Endoscopy;  Laterality: N/A;  . Cardioversion N/A 05/29/2013    Procedure: CARDIOVERSION;  Surgeon: Lelon Perla, MD;  Location: [facility] ENDOSCOPY;  Service: Cardiovascular;  Laterality: N/A;  . Cardioversion N/A 12/15/2013    Procedure: CARDIOVERSION;  Surgeon: Sueanne Margarita, MD;  Location: [facility] ENDOSCOPY;  Service: Cardiovascular;  Laterality: N/A;  . Cardioversion N/A 08/25/2014    Procedure: CARDIOVERSION;  Surgeon: Lelon Perla, MD;  Location: [facility] ENDOSCOPY;  Service: Cardiovascular;  Laterality: N/A;  . Cardioversion N/A 02/17/2015    Procedure: CARDIOVERSION;  Surgeon: Larey Dresser, MD;  Location: [facility] ENDOSCOPY;  Service: Cardiovascular;  Laterality: N/A;   Family History  Problem Relation Age of Onset  . Heart disease      No family history  . Heart  attack Neg Hx    Social History  Substance Use Topics  . Smoking status: Former Smoker -- 0.25 packs/day for 7 years    Types: Cigarettes    Start date: 05/28/1975    Quit date: 05/27/1981  . Smokeless tobacco: Never Used     Comment: quit 25 years ago  . Alcohol Use: 0.0 oz/week    0 Standard drinks or equivalent per week     Comment: wine, occasionally    Review of Systems Endorses only palpitations. Complete Review of Systems obtained and all other systems negative.  Allergies  Ace inhibitors  Home Medications   Prior to Admission medications   Medication Sig Start  Date End Date Taking? Authorizing Provider  amoxicillin (AMOXIL) 500 MG capsule Take 500 mg by mouth daily as needed (1 hour prior to dental appts).  09/13/11  Yes Historical Provider, MD  B Complex Vitamins (VITAMIN B COMPLEX) TABS Take 1 tablet by mouth daily.   Yes Historical Provider, MD  COLCRYS 0.6 MG tablet Take 0.6 mg by mouth as needed (gout).  01/25/13  Yes Historical Provider, MD  furosemide (LASIX) 40 MG tablet Take 40 mg by mouth 2 (two) times daily.    Yes Historical Provider, MD  glucosamine-chondroitin 500-400 MG tablet Take 1 tablet by mouth daily.   Yes Historical Provider, MD  HYDROcodone-acetaminophen (NORCO/VICODIN) 5-325 MG per tablet Take 1 tablet by mouth every 6 (six) hours as needed for moderate pain or severe pain.  04/20/14  Yes Historical Provider, MD  labetalol (NORMODYNE) 200 MG tablet 2 tabs am 1 tab pm Patient taking differently: Take 200 mg by mouth 3 (three) times daily.  12/23/14  Yes Lelon Perla, MD  olmesartan (BENICAR) 40 MG tablet Take 40 mg by mouth at bedtime.  12/30/14  Yes Lelon Perla, MD  sevelamer carbonate (RENVELA) 800 MG tablet Take 800 mg by mouth 3 (three) times daily with meals.    Yes Historical Provider, MD  Testosterone 40.5 MG/2.5GM (1.62%) GEL 2 pumps onto thighs daily Patient taking differently: Apply 1 application topically daily. 2 pumps onto thighs daily 11/25/14  Yes Volanda Napoleon, MD  warfarin (COUMADIN) 7.5 MG tablet Take 7.5 mg by mouth daily at 6 PM.    Yes Historical Provider, MD   BP 134/68 mmHg  Pulse 105  Temp(Src) 97.8 F (36.6 C) (Oral)  Resp 20  Wt 303 lb (137.44 kg)  SpO2 97% Physical Exam  Constitutional: He is oriented to person, place, and time. He appears well-developed and well-nourished.  Non-toxic appearance. He does not appear ill. No distress.  HENT:  Head: Normocephalic and atraumatic.  Right Ear: External ear normal.  Left Ear: External ear normal.  Eyes: Pupils are equal, round, and reactive to  light. No scleral icterus.  Neck: Normal range of motion. Neck supple. No tracheal deviation present.  Cardiovascular: Intact distal pulses.   Murmur heard. Irregular rhythm  Pulmonary/Chest: Effort normal and breath sounds normal. No stridor. No respiratory distress. He has no wheezes. He has no rales.  Abdominal: Soft. Bowel sounds are normal. He exhibits no distension. There is no tenderness. There is no rebound and no guarding.  Musculoskeletal: Normal range of motion.  Neurological: He is alert and oriented to person, place, and time. He has normal strength and normal reflexes. No cranial nerve deficit or sensory deficit.  Skin: Skin is warm and dry. No pallor.  Psychiatric: He has a normal mood and affect. His behavior is normal.  Nursing note and vitals reviewed.   ED Course  Procedures (including critical care time) Labs Review Labs Reviewed  BASIC METABOLIC PANEL - Abnormal; Notable for the following:    CO2 21 (*)    Glucose, Bld 106 (*)    BUN 91 (*)    Creatinine, Ser 9.25 (*)    Calcium 8.0 (*)    GFR calc non Af Amer 6 (*)    GFR calc Af Amer 6 (*)    All other components within normal limits  CBC - Abnormal; Notable for the following:    RBC 3.60 (*)    Hemoglobin 10.4 (*)    HCT 31.2 (*)    RDW 16.9 (*)    All other components within normal limits  PROTIME-INR - Abnormal; Notable for the following:    Prothrombin Time 29.4 (*)    INR 2.84 (*)    All other components within normal limits  MAGNESIUM - Abnormal; Notable for the following:    Magnesium 1.6 (*)    All other components within normal limits  PHOSPHORUS - Abnormal; Notable for the following:    Phosphorus 5.8 (*)    All other components within normal limits  I-STAT TROPOININ, ED    Imaging Review Dg Chest 2 View  02/23/2015  CLINICAL DATA:  Patient with atrial fibrillation.  No chest pain. EXAM: CHEST  2 VIEW COMPARISON:  Chest radiograph 10/26/2014 FINDINGS: Central venous catheter tip  projects over the superior vena cava, stable. Right upper hemi thorax surgical clips. Stable cardiomegaly status post median sternotomy. No consolidative pulmonary opacities. No pleural effusion or pneumothorax. Mid thoracic spine degenerative changes. Stent within the proximal descending thoracic aorta. Old deformities involving multiple left lateral ribs. IMPRESSION: Cardiomegaly.  No acute cardiopulmonary process. Electronically Signed   By: Lovey Newcomer M.D.   On: 02/23/2015 17:43   I have personally reviewed and evaluated these images and lab results as part of my medical decision-making.   EKG Interpretation   Date/Time:  Tuesday February 23 2015 15:46:58 EDT Ventricular Rate:  120 PR Interval:    QRS Duration: 120 QT Interval:  352 QTC Calculation: 497 R Axis:   53 Text Interpretation:  Atrial fibrillation with rapid ventricular response  Incomplete left bundle branch block Non-specific ST-t changes Confirmed by  Ashok Cordia  MD, Lennette Bihari ([id number]) on 02/23/2015 4:55:11 PM      MDM  [name] is a 59 y.o. male with H&P as above. ED clinical course as follows: Patient presents in Afib with RVR resistant to multiple cardioversions. HDS and alert and without instability. No CP or SOB. No weakness. ECG without anatomic evidence of ischemia. RVR present with nonspecific T wave changes. I provided patient with lebetalol IVP 5 mg for RVR control and then I obtained consultation with the Cardiology service for concerns of afib in RVR. I discussed the patients clinical course including their H&P, as well as, their diagnostic studies. Based upon that discussion, we've decided that the patient will require 200 mg dose of home lebetalol along with changes to medication regimen which they provided to the patient. Labs reveal troponin, electrolytes, and INR appropriate. CXR shows similar degree of known cardiomegaly and no acute cardiopulmonary process.  Therefore in consultation with Cardiology and per the  patients requests, patient is stable for d/c at this time without further workup.   Clinical Impression:  1. Atrial fibrillation with rapid ventricular response ([place])   2. Aortic coarctation   3. Aortic aneurysm without rupture,  unspecified portion of aorta ([place])   4. Long term (current) use of anticoagulants   5. Complex coarctation of the aorta   6. History of atrial flutter s/p DCCV   7. ESRD (end stage renal disease) on dialysis [facility])    Disposition:  D/c to home with follow up with Cardiologist tomorrow.  Patient care discussed with Dr. Ashok Cordia, who oversaw their evaluation & treatment & voiced agreement. House Officer: Voncille Lo, MD, Emergency Medicine.   Voncille Lo, MD 02/24/15 [place], MD 02/24/15 [phone]

## 2015-02-24 ENCOUNTER — Telehealth: Payer: Self-pay | Admitting: *Deleted

## 2015-02-24 NOTE — Telephone Encounter (Signed)
Spoke with pt, he is feeling fine this am. His bp is 120/60 and pulse is 72 to 74. Discussed with dr Stanford Breed, the pt will keep his follow up appt in November. He was given the okay to take an extra labetalol when his heart rate is elevated as long as his bp is okay. Pt agreed with this plan.

## 2015-03-10 NOTE — Progress Notes (Signed)
HPI: FU paroxysmal atrial fibrillation/flutter, s/p aortic valve replacement, aortic coarctation, aortic aneurysm, coronary artery disease on CT scan and CKD. Patient is s/p coarctation repair in 1958 and redo in 1971. Had endovascular stent in 1995 and 1996. Had aortic balloon valvuloplasty in 1974 and AVR in 1994. He was followed at the [facility] clinic on a yearly basis but now plans fu at [facility] or [place] is to obtain Chest CT without contrast 5/17 to survey his thoracic aorta. Echocardiogram July 2016 showed normal LV function, severe left ventricular hypertrophy, prior aortic valve replacement with mean gradient 17 mmHg, severe left atrial enlargement.Had chest CT 5/16 in [facility] that showed his aorta was 5.7 x 5.8 cm. He also has been diagnosed with hepatitis C which will require therapy in the future. The gastroenterologists at [facility] recommended against amiodarone given the potential interaction with medications used to treat hepatitis C. He is now on dialysis. Also with h/o recurrent symptomatic atrial fibrillation. Had repeat cardioversion October 2016. However atrial fibrillation recurred. Since last seen, He has been initiated on peritoneal dialysis. He has mild dyspnea but no orthopnea, PND, pedal edema, chest pain or syncope. He does have times when his heart rate is elevated.  Current Outpatient Prescriptions  Medication Sig Dispense Refill  . amoxicillin (AMOXIL) 500 MG capsule Take 500 mg by mouth daily as needed (1 hour prior to dental appts).     . B Complex Vitamins (VITAMIN B COMPLEX) TABS Take 1 tablet by mouth daily.    Marland Kitchen COLCRYS 0.6 MG tablet Take 0.6 mg by mouth as needed (gout).     . furosemide (LASIX) 40 MG tablet Take 40 mg by mouth 2 (two) times daily.     Marland Kitchen glucosamine-chondroitin 500-400 MG tablet Take 1 tablet by mouth daily.    Marland Kitchen HYDROcodone-acetaminophen (NORCO/VICODIN) 5-325 MG per tablet Take 1 tablet by mouth every 6 (six) hours as needed  for moderate pain or severe pain.   0  . labetalol (NORMODYNE) 200 MG tablet 2 tabs am 1 tab pm (Patient taking differently: Take 200 mg by mouth 3 (three) times daily. ) 270 tablet 0  . olmesartan (BENICAR) 40 MG tablet Take 40 mg by mouth at bedtime.     . sevelamer carbonate (RENVELA) 800 MG tablet Take 800 mg by mouth 3 (three) times daily with meals.     . Testosterone 40.5 MG/2.5GM (1.62%) GEL 2 pumps onto thighs daily (Patient taking differently: Apply 1 application topically daily. 2 pumps onto thighs daily) 75 g 3  . warfarin (COUMADIN) 7.5 MG tablet Take 7.5 mg by mouth daily at 6 PM.      No current facility-administered medications for this visit.     Past Medical History  Diagnosis Date  . 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 HTN (hypertension)   . CAD (coronary artery disease)   . Aortic aneurysm ([place])   . Gout   . Atrial fibrillation ([place])   . Anemia   . Hyperlipidemia   . Atrial flutter ([place])   . Obesity   . Anemia of renal disease 10/04/2011  . CKD (chronic kidney disease)     baseline creatinine is 1.5-2  . Gastric polyp 2007, 06/2012    benign.  . Colonic ulcer 2007    felt to be secondary to NSAID  . Testicular adenoma   . PONV (postoperative nausea and vomiting)   . Complex coarctation of the aorta   .  AVD (aortic valve disease)   . Hepatitis C     Past Surgical History  Procedure Laterality Date  . Coarctation of aorta repair  1958  . Coarctation of aorta repair  1970  . Aortic valve replacement and mitral valve repair  1974    Bicuspid aortic valve  . Orchiectomy  1992    testicular cancer  . Aortic valve replacement  1995    [facility]  . Tonsillectomy    . Cardioversion  02/27/2012    Procedure: CARDIOVERSION;  Surgeon: Lelon Perla, MD;  Location: [facility] ENDOSCOPY;  Service: Cardiovascular;  Laterality: N/A;  . Esophagogastroduodenoscopy N/A 07/04/2012    Procedure: ESOPHAGOGASTRODUODENOSCOPY (EGD);  Surgeon: Inda Castle, MD;  Location: [place];  Service: Endoscopy;  Laterality: N/A;  . Cardioversion N/A 05/29/2013    Procedure: CARDIOVERSION;  Surgeon: Lelon Perla, MD;  Location: [facility] ENDOSCOPY;  Service: Cardiovascular;  Laterality: N/A;  . Cardioversion N/A 12/15/2013    Procedure: CARDIOVERSION;  Surgeon: Sueanne Margarita, MD;  Location: [facility] ENDOSCOPY;  Service: Cardiovascular;  Laterality: N/A;  . Cardioversion N/A 08/25/2014    Procedure: CARDIOVERSION;  Surgeon: Lelon Perla, MD;  Location: [facility] ENDOSCOPY;  Service: Cardiovascular;  Laterality: N/A;  . Cardioversion N/A 02/17/2015    Procedure: CARDIOVERSION;  Surgeon: Larey Dresser, MD;  Location: [place];  Service: Cardiovascular;  Laterality: N/A;    Social History   Social History  . Marital Status: Married    Spouse Name: N/A  . Number of Children: 4  . Years of Education: N/A   Occupational History  . Not on file.   Social History Main Topics  . Smoking status: Former Smoker -- 0.25 packs/day for 7 years    Types: Cigarettes    Start date: 05/28/1975    Quit date: 05/27/1981  . Smokeless tobacco: Never Used     Comment: quit 25 years ago  . Alcohol Use: 0.0 oz/week    0 Standard drinks or equivalent per week     Comment: wine, occasionally  . Drug Use: No  . Sexual Activity: Not on file   Other Topics Concern  . Not on file   Social History Narrative   Pt lives in [place] with wife.  3 children ages 83,20,22 (all 3 are adopted)   Sales grinding wheels.    ROS: no fevers or chills, productive cough, hemoptysis, dysphasia, odynophagia, melena, hematochezia, dysuria, hematuria, rash, seizure activity, orthopnea, PND, pedal edema, claudication. Remaining systems are negative.  Physical Exam: Well-developed well-nourished in no acute distress.  Skin is warm and dry.  HEENT is normal.  Neck is supple.  Chest is clear to auscultation with normal expansion.  Cardiovascular exam is irregular, Crisp  mechanical valve sound Abdominal exam nontender or distended. No masses palpated. Extremities show no edema. neuro grossly intact  ECG 02/23/2015-atrial fibrillation, incomplete left bundle branch block, lateral T-wave inversion  Electrocardiogram today shows atrial fibrillation, left bundle branch block.

## 2015-03-17 ENCOUNTER — Encounter: Payer: Self-pay | Admitting: Cardiology

## 2015-03-17 ENCOUNTER — Ambulatory Visit (INDEPENDENT_AMBULATORY_CARE_PROVIDER_SITE_OTHER): Payer: BLUE CROSS/BLUE SHIELD | Admitting: Cardiology

## 2015-03-17 VITALS — BP 124/65 | HR 90 | Ht 72.0 in | Wt 311.1 lb

## 2015-03-17 DIAGNOSIS — I2510 Atherosclerotic heart disease of native coronary artery without angina pectoris: Secondary | ICD-10-CM | POA: Diagnosis not present

## 2015-03-17 DIAGNOSIS — I480 Paroxysmal atrial fibrillation: Secondary | ICD-10-CM

## 2015-03-17 DIAGNOSIS — Z954 Presence of other heart-valve replacement: Secondary | ICD-10-CM | POA: Diagnosis not present

## 2015-03-17 DIAGNOSIS — Z952 Presence of prosthetic heart valve: Secondary | ICD-10-CM

## 2015-03-17 DIAGNOSIS — Q251 Coarctation of aorta: Secondary | ICD-10-CM

## 2015-03-17 DIAGNOSIS — I2583 Coronary atherosclerosis due to lipid rich plaque: Secondary | ICD-10-CM

## 2015-03-17 DIAGNOSIS — I10 Essential (primary) hypertension: Secondary | ICD-10-CM | POA: Diagnosis not present

## 2015-03-17 MED ORDER — METOPROLOL SUCCINATE ER 50 MG PO TB24: 50.0000 mg | ORAL_TABLET | Freq: Every day | ORAL | Status: DC

## 2015-03-17 NOTE — Assessment & Plan Note (Signed)
Patient is back in atrial fibrillation. He has undergone previous cardioversions and is not holding sinus rhythm. His options for antiarrhythmics are limited. He would not be a candidate for sotalol or tikosyn given renal failure. He is not a candidate for amiodarone as he will require therapy for hepatitis C and based on his evaluation at [facility] the medications required would interact with amiodarone. He is not a candidate for 1C agent given structural heart disease. We have therefore decided to try rate control and anticoagulation. His rate is elevated at times. I will discontinue labetalol and begin Toprol 50 mg daily. He will follow his heart rate and blood pressure at home. If his heart rate increases her blood pressure increases he will increase his Toprol to 100 mg daily. I will see him back in 4 weeks to make sure that his rate and blood pressure are controlled.

## 2015-03-17 NOTE — Assessment & Plan Note (Signed)
Continue SBE prophylaxis. Continue Coumadin.

## 2015-03-17 NOTE — Assessment & Plan Note (Signed)
Patient is scheduled to see a physician at [facility] later this week to follow his thoracic aortic aneurysm. Given that he is now end-stage renal disease he likely could have CTAs for evaluation in the future.

## 2015-03-17 NOTE — Patient Instructions (Signed)
Medication Instructions:   STOP LABETALOL  START METOPROLOL SUCC ER 50 MG ONCE DAILY  Follow-Up:  Your physician recommends that you schedule a follow-up appointment in: [place]   If you need a refill on your cardiac medications before your next appointment, please call your pharmacy.

## 2015-03-17 NOTE — Assessment & Plan Note (Signed)
As above we are changing his labetalol to Toprol. Follow blood pressure and adjust regimen as indicated.

## 2015-03-17 NOTE — Assessment & Plan Note (Signed)
Patient scheduled to be seen at [facility] later this week.

## 2015-03-17 NOTE — Assessment & Plan Note (Signed)
No statin given hepatitis C and pending need for therapy. No aspirin given need for Coumadin.

## 2015-03-22 ENCOUNTER — Other Ambulatory Visit: Payer: Self-pay | Admitting: *Deleted

## 2015-03-22 DIAGNOSIS — I480 Paroxysmal atrial fibrillation: Secondary | ICD-10-CM

## 2015-03-22 MED ORDER — METOPROLOL SUCCINATE ER 50 MG PO TB24: 50.0000 mg | ORAL_TABLET | Freq: Every day | ORAL | Status: DC

## 2015-03-24 ENCOUNTER — Other Ambulatory Visit: Payer: Self-pay | Admitting: Hematology & Oncology

## 2015-04-01 ENCOUNTER — Telehealth: Payer: Self-pay | Admitting: Cardiology

## 2015-04-01 NOTE — Telephone Encounter (Signed)
New message      *STAT* If patient is at the pharmacy, call can be transferred to refill team.   1. Which medications need to be refilled? (please list name of each medication and dose if known) labetolol  200 mg   2. Which pharmacy/location (including street and city if local pharmacy) is medication to be sent to? [facility]    3. Do they need a 30 day or 90 day supply?  90 days supply

## 2015-04-01 NOTE — Telephone Encounter (Signed)
Medication in question was discontinued by Dr Stanford Breed on 03/17/15 and changed to Toprol XL. Relayed this information to [facility] staff at [facility].

## 2015-04-09 ENCOUNTER — Encounter: Payer: Self-pay | Admitting: Cardiology

## 2015-04-13 NOTE — Progress Notes (Signed)
HPI:  FU paroxysmal atrial fibrillation/flutter, s/p aortic valve replacement, aortic coarctation, aortic aneurysm, coronary artery disease on CT scan and CKD. Patient is s/p coarctation repair in 1958 and redo in 1971. Had endovascular stent in 1995 and 1996. Had aortic balloon valvuloplasty in 1974 and AVR in 1994. He was followed at the [facility] clinic on a yearly basis but now plans fu at [place] is to obtain Chest CT without contrast 5/17 to survey his thoracic aorta. Echocardiogram July 2016 showed normal LV function, severe left ventricular hypertrophy, prior aortic valve replacement with mean gradient 17 mmHg, severe left atrial enlargement.Had chest CT 5/16 in [facility] that showed his aorta was 5.7 x 5.8 cm. He also has been diagnosed with hepatitis C which will require therapy in the future. The gastroenterologists at [facility] recommended against amiodarone given the potential interaction with medications used to treat hepatitis C. He is now on dialysis. Also with h/o recurrent symptomatic atrial fibrillation. Had repeat cardioversion October 2016. However atrial fibrillation recurred. We are now trying rate control. Since last seen, He denies dyspnea, chest pain, palpitations or syncope.  Current Outpatient Prescriptions  Medication Sig Dispense Refill  . amoxicillin (AMOXIL) 500 MG capsule Take 500 mg by mouth daily as needed (1 hour prior to dental appts).     . ANDROGEL PUMP 20.25 MG/ACT (1.62%) GEL APPLY 2 PUMPS DAILY 75 g 1  . B Complex Vitamins (VITAMIN B COMPLEX) TABS Take 1 tablet by mouth daily.    Marland Kitchen COLCRYS 0.6 MG tablet Take 0.6 mg by mouth as needed (gout).     . furosemide (LASIX) 40 MG tablet Take 40 mg by mouth 2 (two) times daily.     Marland Kitchen glucosamine-chondroitin 500-400 MG tablet Take 1 tablet by mouth daily.    Marland Kitchen HYDROcodone-acetaminophen (NORCO/VICODIN) 5-325 MG per tablet Take 1 tablet by mouth every 6 (six) hours as needed for moderate pain or severe  pain.   0  . metoprolol succinate (TOPROL-XL) 100 MG 24 hr tablet Take 100 mg by mouth daily. Take with or immediately following a meal.    . metoprolol succinate (TOPROL-XL) 50 MG 24 hr tablet Take 1 tablet (50 mg total) by mouth daily. Take with or immediately following a meal. 90 tablet 3  . olmesartan (BENICAR) 40 MG tablet Take 40 mg by mouth at bedtime.     . sevelamer carbonate (RENVELA) 800 MG tablet Take 800 mg by mouth 3 (three) times daily with meals.     . warfarin (COUMADIN) 7.5 MG tablet Take 7.5 mg by mouth daily at 6 PM.     . warfarin (COUMADIN) 7.5 MG tablet TAKE 1 TABLET BY MOUTH ONCE DAILY. 90 tablet 1   No current facility-administered medications for this visit.     Past Medical History  Diagnosis Date  . 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 HTN (hypertension)   . CAD (coronary artery disease)   . Aortic aneurysm ([place])   . Gout   . Atrial fibrillation ([place])   . Anemia   . Hyperlipidemia   . Atrial flutter ([place])   . Obesity   . Anemia of renal disease 10/04/2011  . CKD (chronic kidney disease)     baseline creatinine is 1.5-2  . Gastric polyp 2007, 06/2012    benign.  . Colonic ulcer 2007    felt to be secondary to NSAID  . Testicular adenoma   . PONV (postoperative  nausea and vomiting)   . Complex coarctation of the aorta   . AVD (aortic valve disease)   . Hepatitis C     Past Surgical History  Procedure Laterality Date  . Coarctation of aorta repair  1958  . Coarctation of aorta repair  1970  . Aortic valve replacement and mitral valve repair  1974    Bicuspid aortic valve  . Orchiectomy  1992    testicular cancer  . Aortic valve replacement  1995    [facility]  . Tonsillectomy    . Cardioversion  02/27/2012    Procedure: CARDIOVERSION;  Surgeon: Lelon Perla, MD;  Location: [facility] ENDOSCOPY;  Service: Cardiovascular;  Laterality: N/A;  . Esophagogastroduodenoscopy N/A 07/04/2012    Procedure: ESOPHAGOGASTRODUODENOSCOPY  (EGD);  Surgeon: Inda Castle, MD;  Location: [place];  Service: Endoscopy;  Laterality: N/A;  . Cardioversion N/A 05/29/2013    Procedure: CARDIOVERSION;  Surgeon: Lelon Perla, MD;  Location: [facility] ENDOSCOPY;  Service: Cardiovascular;  Laterality: N/A;  . Cardioversion N/A 12/15/2013    Procedure: CARDIOVERSION;  Surgeon: Sueanne Margarita, MD;  Location: [facility] ENDOSCOPY;  Service: Cardiovascular;  Laterality: N/A;  . Cardioversion N/A 08/25/2014    Procedure: CARDIOVERSION;  Surgeon: Lelon Perla, MD;  Location: [facility] ENDOSCOPY;  Service: Cardiovascular;  Laterality: N/A;  . Cardioversion N/A 02/17/2015    Procedure: CARDIOVERSION;  Surgeon: Larey Dresser, MD;  Location: [place];  Service: Cardiovascular;  Laterality: N/A;    Social History   Social History  . Marital Status: Married    Spouse Name: N/A  . Number of Children: 4  . Years of Education: N/A   Occupational History  . Not on file.   Social History Main Topics  . Smoking status: Former Smoker -- 0.25 packs/day for 7 years    Types: Cigarettes    Start date: 05/28/1975    Quit date: 05/27/1981  . Smokeless tobacco: Never Used     Comment: quit 25 years ago  . Alcohol Use: 0.0 oz/week    0 Standard drinks or equivalent per week     Comment: wine, occasionally  . Drug Use: No  . Sexual Activity: Not on file   Other Topics Concern  . Not on file   Social History Narrative   Pt lives in [place] with wife.  3 children ages 36,20,22 (all 3 are adopted)   Sales grinding wheels.    ROS: no fevers or chills, productive cough, hemoptysis, dysphasia, odynophagia, melena, hematochezia, dysuria, hematuria, rash, seizure activity, orthopnea, PND, pedal edema, claudication. Remaining systems are negative.  Physical Exam: Well-developed well-nourished in no acute distress.  Skin is warm and dry.  HEENT is normal.  Neck is supple.  Chest is clear to auscultation with normal expansion.  Cardiovascular exam is  irregular Abdominal exam nontender or distended. No masses palpated. Extremities show no edema. neuro grossly intact  ECG Atrial flutter at a rate of 68. Normal axis. IVCD. Nonspecific ST changes.

## 2015-04-14 ENCOUNTER — Ambulatory Visit (INDEPENDENT_AMBULATORY_CARE_PROVIDER_SITE_OTHER): Payer: BLUE CROSS/BLUE SHIELD | Admitting: Cardiology

## 2015-04-14 ENCOUNTER — Encounter: Payer: Self-pay | Admitting: Cardiology

## 2015-04-14 VITALS — BP 124/76 | HR 68 | Ht 72.0 in | Wt 310.8 lb

## 2015-04-14 DIAGNOSIS — I359 Nonrheumatic aortic valve disorder, unspecified: Secondary | ICD-10-CM

## 2015-04-14 DIAGNOSIS — I2510 Atherosclerotic heart disease of native coronary artery without angina pectoris: Secondary | ICD-10-CM

## 2015-04-14 DIAGNOSIS — Q251 Coarctation of aorta: Secondary | ICD-10-CM

## 2015-04-14 DIAGNOSIS — I480 Paroxysmal atrial fibrillation: Secondary | ICD-10-CM | POA: Diagnosis not present

## 2015-04-14 DIAGNOSIS — I2583 Coronary atherosclerosis due to lipid rich plaque: Principal | ICD-10-CM

## 2015-04-14 MED ORDER — OLMESARTAN MEDOXOMIL 40 MG PO TABS: 40.0000 mg | ORAL_TABLET | Freq: Every day | ORAL | Status: DC

## 2015-04-14 NOTE — Assessment & Plan Note (Signed)
Patient has been seen at [facility]. Plan is for follow-up chest CT May 2017.

## 2015-04-14 NOTE — Assessment & Plan Note (Signed)
Patient remains in atrial flutter today. However he appears to relatively asymptomatic. Continue Toprol for rate control. We will check a 24-hour Holter monitor to make sure that his rate is adequately controlled. Continue Coumadin. Note options for antiarrhythmics would be limited. He would not be a candidate for amiodarone given upcoming treatment for hepatitis C. No sotalol or tikosyn given renal insufficiency. He would not be a candidate for A1c agent given structural heart disease.

## 2015-04-14 NOTE — Assessment & Plan Note (Signed)
Patient is not on a statinBecause of history of hepatitis C and need for therapy upcoming.

## 2015-04-14 NOTE — Assessment & Plan Note (Signed)
Status post aortic valve replacement. Continue SBE prophylaxis. 

## 2015-04-14 NOTE — Assessment & Plan Note (Signed)
Blood pressure controlled. Continue present medications. 

## 2015-04-14 NOTE — Assessment & Plan Note (Signed)
Now followed at [place] for follow-up chest CT May 2017.

## 2015-04-14 NOTE — Patient Instructions (Signed)
Medication Instructions:  Please continue your current medications  Labwork: NONE  Testing/Procedures: 1. Holter Monitor - Your physician has recommended that you wear a holter monitor. Holter monitors are medical devices that record the heart's electrical activity. Doctors most often use these monitors to diagnose arrhythmias. Arrhythmias are problems with the speed or rhythm of the heartbeat. The monitor is a small, portable device. You can wear one while you do your normal daily activities. This is usually used to diagnose what is causing palpitations/syncope (passing out). This will be placed at our [facility] office - [address].  Follow-Up: Dr Stanford Breed recommends that you schedule a follow-up appointment in 3 months. You will receive a reminder letter in the mail two months in advance. If you don't receive a letter, please call our office to schedule the follow-up appointment.  If you need a refill on your cardiac medications before your next appointment, please call your pharmacy.

## 2015-04-19 ENCOUNTER — Encounter (INDEPENDENT_AMBULATORY_CARE_PROVIDER_SITE_OTHER): Payer: BLUE CROSS/BLUE SHIELD

## 2015-04-19 DIAGNOSIS — I480 Paroxysmal atrial fibrillation: Secondary | ICD-10-CM | POA: Diagnosis not present

## 2015-05-13 NOTE — Addendum Note (Signed)
Addended by: Therisa Doyne on: 05/13/2015 08:07 AM   Modules accepted: Orders

## 2015-05-28 ENCOUNTER — Encounter: Payer: Self-pay | Admitting: Family Medicine

## 2015-05-28 ENCOUNTER — Ambulatory Visit (INDEPENDENT_AMBULATORY_CARE_PROVIDER_SITE_OTHER): Payer: BLUE CROSS/BLUE SHIELD | Admitting: Family Medicine

## 2015-05-28 VITALS — BP 116/58 | HR 66 | Ht 74.0 in | Wt 306.0 lb

## 2015-05-28 DIAGNOSIS — M25562 Pain in left knee: Secondary | ICD-10-CM | POA: Diagnosis not present

## 2015-05-28 MED ORDER — METHYLPREDNISOLONE ACETATE 40 MG/ML IJ SUSP
40.0000 mg | Freq: Once | INTRAMUSCULAR | Status: AC
  Administered 2015-05-28: 40 mg via INTRA_ARTICULAR

## 2015-05-28 MED ORDER — OXYCODONE-ACETAMINOPHEN 7.5-325 MG PO TABS: 1.0000 | ORAL_TABLET | Freq: Four times a day (QID) | ORAL | Status: DC | PRN

## 2015-05-28 NOTE — Patient Instructions (Signed)
You have a severe flare of arthritis, less likely a meniscus tear. Icing 15 minutes at a time 3-4 times a day. Elevate above your heart when possible. ACE wrap for compression. Voltaren gel up to 4 times a day. Tylenol 500mg  1-2 tabs three times a day for pain. You were given a cortisone shot today. Activities as tolerated. If not improving as expected would consider repeating your x-rays and possibly MRI. I would take it easy on the stretching exercises you normally do for about a week. Follow up with me in 1 month but call me sooner for any problems - it can take a few days for the cortisone to kick in.

## 2015-06-01 NOTE — Assessment & Plan Note (Signed)
consistent with severe flare of DJD, less likely meniscus tear.  Discussed options - icing, elevation, ace wrap, voltaren gel and tylenol.  Intraarticular injection given today.  F/u in 1 month.  After informed written consent, patient was seated on exam table. Left knee was prepped with alcohol swab and utilizing superolateral approach with ultrasound guidance, patient's left knee was injected intraarticularly with 3:1 marcaine: depomedrol. Patient tolerated the procedure well without immediate complications.

## 2015-06-01 NOTE — Progress Notes (Signed)
PCP: Lilian Coma., MD  Subjective:   HPI: Patient is a 60 y.o. male here for left knee pain.  Patient reports 2 weeks of worsening left knee pain anteriorly. Pain level 5/10 at rest, worsens with trying to bear weight. Limping badly, sharp pain. + swelling. Tried ice/heat, flexeril, tylenol, norco, stretching. Using a TENS unit. No skin changes, fever.  Past Medical History  Diagnosis Date  . 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 HTN (hypertension)   . CAD (coronary artery disease)   . Aortic aneurysm ([place])   . Gout   . Atrial fibrillation ([place])   . Anemia   . Hyperlipidemia   . Atrial flutter ([place])   . Obesity   . Anemia of renal disease 10/04/2011  . CKD (chronic kidney disease)     baseline creatinine is 1.5-2  . Gastric polyp 2007, 06/2012    benign.  . Colonic ulcer 2007    felt to be secondary to NSAID  . Testicular adenoma   . PONV (postoperative nausea and vomiting)   . Complex coarctation of the aorta   . AVD (aortic valve disease)   . Hepatitis C     Current Outpatient Prescriptions on File Prior to Visit  Medication Sig Dispense Refill  . amoxicillin (AMOXIL) 500 MG capsule Take 500 mg by mouth daily as needed (1 hour prior to dental appts).     . ANDROGEL PUMP 20.25 MG/ACT (1.62%) GEL APPLY 2 PUMPS DAILY 75 g 1  . B Complex Vitamins (VITAMIN B COMPLEX) TABS Take 1 tablet by mouth daily.    Marland Kitchen COLCRYS 0.6 MG tablet Take 0.6 mg by mouth as needed (gout).     . furosemide (LASIX) 40 MG tablet Take 40 mg by mouth 2 (two) times daily.     Marland Kitchen glucosamine-chondroitin 500-400 MG tablet Take 1 tablet by mouth daily.    . metoprolol succinate (TOPROL-XL) 100 MG 24 hr tablet Take 100 mg by mouth daily. Take with or immediately following a meal.    . metoprolol succinate (TOPROL-XL) 50 MG 24 hr tablet Take 1 tablet (50 mg total) by mouth daily. Take with or immediately following a meal. 90 tablet 3  . olmesartan (BENICAR) 40 MG tablet Take 1 tablet (40  mg total) by mouth at bedtime. 90 tablet 3  . sevelamer carbonate (RENVELA) 800 MG tablet Take 800 mg by mouth 3 (three) times daily with meals.     . warfarin (COUMADIN) 7.5 MG tablet Take 7.5 mg by mouth daily at 6 PM.     . warfarin (COUMADIN) 7.5 MG tablet TAKE 1 TABLET BY MOUTH ONCE DAILY. 90 tablet 1   No current facility-administered medications on file prior to visit.    Past Surgical History  Procedure Laterality Date  . Coarctation of aorta repair  1958  . Coarctation of aorta repair  1970  . Aortic valve replacement and mitral valve repair  1974    Bicuspid aortic valve  . Orchiectomy  1992    testicular cancer  . Aortic valve replacement  1995    [facility]  . Tonsillectomy    . Cardioversion  02/27/2012    Procedure: CARDIOVERSION;  Surgeon: Lelon Perla, MD;  Location: [facility] ENDOSCOPY;  Service: Cardiovascular;  Laterality: N/A;  . Esophagogastroduodenoscopy N/A 07/04/2012    Procedure: ESOPHAGOGASTRODUODENOSCOPY (EGD);  Surgeon: Inda Castle, MD;  Location: [place];  Service: Endoscopy;  Laterality: N/A;  . Cardioversion N/A  05/29/2013    Procedure: CARDIOVERSION;  Surgeon: Lelon Perla, MD;  Location: [facility] ENDOSCOPY;  Service: Cardiovascular;  Laterality: N/A;  . Cardioversion N/A 12/15/2013    Procedure: CARDIOVERSION;  Surgeon: Sueanne Margarita, MD;  Location: [facility] ENDOSCOPY;  Service: Cardiovascular;  Laterality: N/A;  . Cardioversion N/A 08/25/2014    Procedure: CARDIOVERSION;  Surgeon: Lelon Perla, MD;  Location: [facility] ENDOSCOPY;  Service: Cardiovascular;  Laterality: N/A;  . Cardioversion N/A 02/17/2015    Procedure: CARDIOVERSION;  Surgeon: Larey Dresser, MD;  Location: [facility] ENDOSCOPY;  Service: Cardiovascular;  Laterality: N/A;    Allergies  Allergen Reactions  . Ace Inhibitors Swelling and Anxiety    Altace  "Can tolerate Benicar".  Diffuse swelling Edema Diffuse swelling No issues with Benicar    Social History   Social History  .  Marital Status: Married    Spouse Name: N/A  . Number of Children: 4  . Years of Education: N/A   Occupational History  . Not on file.   Social History Main Topics  . Smoking status: Former Smoker -- 0.25 packs/day for 7 years    Types: Cigarettes    Start date: 05/28/1975    Quit date: 05/27/1981  . Smokeless tobacco: Never Used     Comment: quit 25 years ago  . Alcohol Use: 0.0 oz/week    0 Standard drinks or equivalent per week     Comment: wine, occasionally  . Drug Use: No  . Sexual Activity: Not on file   Other Topics Concern  . Not on file   Social History Narrative   Pt lives in [place] with wife.  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    Family History  Problem Relation Age of Onset  . Heart disease      No family history  . Heart attack Neg Hx     BP 116/58 mmHg  Pulse 66  Ht 6\' 2"  (1.88 m)  Wt 306 lb (138.801 kg)  BMI 39.27 kg/m2  Review of Systems: See HPI above.    Objective:  Physical Exam:  Gen: NAD  Left knee: Mod effusion.  No bruising, other deformity. Medial and lateral joint line tenderness. ROM 0 - 120 degrees. Negative ant/post drawers. Negative valgus/varus testing. Negative lachmanns. Negative mcmurrays, apleys, patellar apprehension. NV intact distally.  Right knee: FROM without pain.    Assessment & Plan:  1. Left knee pain - consistent with severe flare of DJD, less likely meniscus tear.  Discussed options - icing, elevation, ace wrap, voltaren gel and tylenol.  Intraarticular injection given today.  F/u in 1 month.  After informed written consent, patient was seated on exam table. Left knee was prepped with alcohol swab and utilizing superolateral approach with ultrasound guidance, patient's left knee was injected intraarticularly with 3:1 marcaine: depomedrol. Patient tolerated the procedure well without immediate complications.

## 2015-07-14 ENCOUNTER — Ambulatory Visit (INDEPENDENT_AMBULATORY_CARE_PROVIDER_SITE_OTHER): Payer: Medicare Other | Admitting: Cardiology

## 2015-07-14 ENCOUNTER — Encounter: Payer: Self-pay | Admitting: Cardiology

## 2015-07-14 VITALS — BP 102/64 | HR 79 | Ht 74.0 in | Wt 312.0 lb

## 2015-07-14 DIAGNOSIS — Q251 Coarctation of aorta: Secondary | ICD-10-CM | POA: Diagnosis not present

## 2015-07-14 DIAGNOSIS — I2510 Atherosclerotic heart disease of native coronary artery without angina pectoris: Secondary | ICD-10-CM | POA: Diagnosis not present

## 2015-07-14 DIAGNOSIS — I359 Nonrheumatic aortic valve disorder, unspecified: Secondary | ICD-10-CM

## 2015-07-14 DIAGNOSIS — I4891 Unspecified atrial fibrillation: Secondary | ICD-10-CM

## 2015-07-14 DIAGNOSIS — I10 Essential (primary) hypertension: Secondary | ICD-10-CM

## 2015-07-14 DIAGNOSIS — I2583 Coronary atherosclerosis due to lipid rich plaque: Secondary | ICD-10-CM

## 2015-07-14 NOTE — Assessment & Plan Note (Signed)
Now followed at [facility]. Follow-up chest CT May 2017.

## 2015-07-14 NOTE — Assessment & Plan Note (Signed)
Not on a statin given history of liver disease. He had a recent echocardiogram and nuclear study at [facility] as a pre-renal transplant evaluation. We will obtain those records.

## 2015-07-14 NOTE — Assessment & Plan Note (Signed)
Follow-up chest CT at [facility] 2017.

## 2015-07-14 NOTE — Assessment & Plan Note (Signed)
Patient remains in atrial fibrillation. Continue metoprolol for rate control. Continue Coumadin.He has not maintained sinus rhythm despite multiple cardioversions. He would not be a good candidate for the majority of antiarrhythmics as outlined previously. Plan rate control and anticoagulation.

## 2015-07-14 NOTE — Assessment & Plan Note (Signed)
Blood pressure controlled. Continue present medications. 

## 2015-07-14 NOTE — Assessment & Plan Note (Signed)
Patient is status post aortic valve replacement. Continue Coumadin. Continue SBE prophylaxis.

## 2015-07-14 NOTE — Progress Notes (Signed)
HPI: FU paroxysmal atrial fibrillation/flutter, s/p aortic valve replacement, aortic coarctation, aortic aneurysm, coronary artery disease on CT scan and CKD. Patient is s/p coarctation repair in 1958 and redo in 1971. Had endovascular stent in 1995 and 1996. Had aortic balloon valvuloplasty in 1974 and AVR in 1994. He was followed at the [facility] clinic on a yearly basis but now plans fu at [place] is to obtain Chest CT without contrast 5/17 to survey his thoracic aorta. Echocardiogram July 2016 showed normal LV function, severe left ventricular hypertrophy, prior aortic valve replacement with mean gradient 17 mmHg, severe left atrial enlargement.Had chest CT 5/16 in [facility] that showed his aorta was 5.7 x 5.8 cm. He also has been diagnosed with hepatitis C which will require therapy in the future. The gastroenterologists at [facility] recommended against amiodarone given the potential interaction with medications used to treat hepatitis C. He is now on dialysis. Also with h/o recurrent symptomatic atrial fibrillation. Had repeat cardioversion October 2016. However atrial fibrillation recurred. We are now trying rate control. Holter monitor December 2016 showed atrial fibrillation with PVCs or aberrantly conducted beats rate controlled.Since last seen, Patient denies dyspnea, chest pain, palpitations or syncope. No bleeding.  Current Outpatient Prescriptions  Medication Sig Dispense Refill  . amoxicillin (AMOXIL) 500 MG capsule Take 500 mg by mouth daily as needed (1 hour prior to dental appts).     . ANDROGEL PUMP 20.25 MG/ACT (1.62%) GEL APPLY 2 PUMPS DAILY 75 g 1  . B Complex Vitamins (VITAMIN B COMPLEX) TABS Take 1 tablet by mouth daily.    Marland Kitchen COLCRYS 0.6 MG tablet Take 0.6 mg by mouth as needed (gout).     Marland Kitchen glucosamine-chondroitin 500-400 MG tablet Take 1 tablet by mouth daily.    . metoprolol succinate (TOPROL-XL) 50 MG 24 hr tablet Take 1 tablet (50 mg total) by mouth  daily. Take with or immediately following a meal. 90 tablet 3  . olmesartan (BENICAR) 40 MG tablet Take 1 tablet (40 mg total) by mouth at bedtime. (Patient taking differently: Take 20 mg by mouth at bedtime. ) 90 tablet 3  . oxyCODONE-acetaminophen (PERCOCET) 7.5-325 MG tablet Take 1 tablet by mouth every 6 (six) hours as needed for severe pain. 30 tablet 0  . sevelamer carbonate (RENVELA) 800 MG tablet Take 800 mg by mouth 3 (three) times daily with meals.     . warfarin (COUMADIN) 7.5 MG tablet Take 7.5 mg by mouth daily at 6 PM.      No current facility-administered medications for this visit.     Past Medical History  Diagnosis Date  . 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 HTN (hypertension)   . CAD (coronary artery disease)   . Aortic aneurysm ([place])   . Gout   . Atrial fibrillation ([place])   . Anemia   . Hyperlipidemia   . Atrial flutter ([place])   . Obesity   . Anemia of renal disease 10/04/2011  . CKD (chronic kidney disease)     baseline creatinine is 1.5-2  . Gastric polyp 2007, 06/2012    benign.  . Colonic ulcer 2007    felt to be secondary to NSAID  . Testicular adenoma   . PONV (postoperative nausea and vomiting)   . Complex coarctation of the aorta   . AVD (aortic valve disease)   . Hepatitis C     Past Surgical History  Procedure Laterality Date  .  Coarctation of aorta repair  1958  . Coarctation of aorta repair  1970  . Aortic valve replacement and mitral valve repair  1974    Bicuspid aortic valve  . Orchiectomy  1992    testicular cancer  . Aortic valve replacement  1995    [facility]  . Tonsillectomy    . Cardioversion  02/27/2012    Procedure: CARDIOVERSION;  Surgeon: Lelon Perla, MD;  Location: [facility] ENDOSCOPY;  Service: Cardiovascular;  Laterality: N/A;  . Esophagogastroduodenoscopy N/A 07/04/2012    Procedure: ESOPHAGOGASTRODUODENOSCOPY (EGD);  Surgeon: Inda Castle, MD;  Location: [place];  Service: Endoscopy;   Laterality: N/A;  . Cardioversion N/A 05/29/2013    Procedure: CARDIOVERSION;  Surgeon: Lelon Perla, MD;  Location: [facility] ENDOSCOPY;  Service: Cardiovascular;  Laterality: N/A;  . Cardioversion N/A 12/15/2013    Procedure: CARDIOVERSION;  Surgeon: Sueanne Margarita, MD;  Location: [facility] ENDOSCOPY;  Service: Cardiovascular;  Laterality: N/A;  . Cardioversion N/A 08/25/2014    Procedure: CARDIOVERSION;  Surgeon: Lelon Perla, MD;  Location: [facility] ENDOSCOPY;  Service: Cardiovascular;  Laterality: N/A;  . Cardioversion N/A 02/17/2015    Procedure: CARDIOVERSION;  Surgeon: Larey Dresser, MD;  Location: [place];  Service: Cardiovascular;  Laterality: N/A;    Social History   Social History  . Marital Status: Married    Spouse Name: N/A  . Number of Children: 4  . Years of Education: N/A   Occupational History  . Not on file.   Social History Main Topics  . Smoking status: Former Smoker -- 0.25 packs/day for 7 years    Types: Cigarettes    Start date: 05/28/1975    Quit date: 05/27/1981  . Smokeless tobacco: Never Used     Comment: quit 25 years ago  . Alcohol Use: 0.0 oz/week    0 Standard drinks or equivalent per week     Comment: wine, occasionally  . Drug Use: No  . Sexual Activity: Not on file   Other Topics Concern  . Not on file   Social History Narrative   Pt lives in [place] with wife.  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    Family History  Problem Relation Age of Onset  . Heart disease      No family history  . Heart attack Neg Hx     ROS: no fevers or chills, productive cough, hemoptysis, dysphasia, odynophagia, melena, hematochezia, dysuria, hematuria, rash, seizure activity, orthopnea, PND, pedal edema, claudication. Remaining systems are negative.  Physical Exam: Well-developed well-nourished in no acute distress.  Skin is warm and dry.  HEENT is normal.  Neck is supple.  Chest is clear to auscultation with normal  expansion.  Cardiovascular exam is irregular Abdominal exam nontender or distended. No masses palpated. Extremities show no edema. neuro grossly intact  ECG Atrial fibrillation at a rate of 79. IVCD. Nonspecific ST changes.

## 2015-07-14 NOTE — Patient Instructions (Signed)
Your physician wants you to follow-up in: 6 MONTHS WITH DR CRENSHAW You will receive a reminder letter in the mail two months in advance. If you don't receive a letter, please call our office to schedule the follow-up appointment.   If you need a refill on your cardiac medications before your next appointment, please call your pharmacy.  

## 2015-07-22 ENCOUNTER — Encounter: Payer: Self-pay | Admitting: Cardiology

## 2015-07-27 ENCOUNTER — Encounter: Payer: Self-pay | Admitting: *Deleted

## 2016-04-25 ENCOUNTER — Other Ambulatory Visit: Payer: Self-pay

## 2016-04-25 DIAGNOSIS — I480 Paroxysmal atrial fibrillation: Secondary | ICD-10-CM

## 2016-04-25 MED ORDER — METOPROLOL SUCCINATE ER 50 MG PO TB24: 50.0000 mg | ORAL_TABLET | Freq: Every day | ORAL | 0 refills | Status: DC

## 2016-04-27 ENCOUNTER — Other Ambulatory Visit: Payer: Self-pay | Admitting: *Deleted

## 2016-04-27 DIAGNOSIS — I480 Paroxysmal atrial fibrillation: Secondary | ICD-10-CM

## 2016-04-27 MED ORDER — METOPROLOL SUCCINATE ER 50 MG PO TB24: 50.0000 mg | ORAL_TABLET | Freq: Every day | ORAL | 0 refills | Status: DC

## 2016-08-04 ENCOUNTER — Other Ambulatory Visit: Payer: Self-pay | Admitting: *Deleted

## 2016-08-04 DIAGNOSIS — I712 Thoracic aortic aneurysm, without rupture, unspecified: Secondary | ICD-10-CM

## 2016-08-07 ENCOUNTER — Encounter: Payer: Self-pay | Admitting: Cardiology

## 2016-08-08 ENCOUNTER — Encounter (HOSPITAL_BASED_OUTPATIENT_CLINIC_OR_DEPARTMENT_OTHER): Payer: Self-pay

## 2016-08-08 ENCOUNTER — Ambulatory Visit (HOSPITAL_BASED_OUTPATIENT_CLINIC_OR_DEPARTMENT_OTHER)
Admission: RE | Admit: 2016-08-08 | Discharge: 2016-08-08 | Disposition: A | Discharge status: Home / Self Care | Payer: Medicare Other | Source: Ambulatory Visit | Attending: Cardiology | Admitting: Cardiology

## 2016-08-08 DIAGNOSIS — I712 Thoracic aortic aneurysm, without rupture, unspecified: Secondary | ICD-10-CM

## 2016-08-08 DIAGNOSIS — Z95828 Presence of other vascular implants and grafts: Secondary | ICD-10-CM | POA: Diagnosis not present

## 2016-08-08 DIAGNOSIS — Z952 Presence of prosthetic heart valve: Secondary | ICD-10-CM | POA: Insufficient documentation

## 2016-08-08 HISTORY — DX: Disorder of kidney and ureter, unspecified: N28.9

## 2016-08-08 MED ORDER — IOPAMIDOL (ISOVUE-370) INJECTION 76%
100.0000 mL | Freq: Once | INTRAVENOUS | Status: AC | PRN
  Administered 2016-08-08: 90 mL via INTRAVENOUS

## 2016-08-08 NOTE — Progress Notes (Signed)
HPI: FU paroxysmal atrial fibrillation/flutter, s/p aortic valve replacement, aortic coarctation, aortic aneurysm, coronary artery disease on CT scan and CKD. Patient is s/p coarctation repair in 1958 and redo in 1971. Had endovascular stent in 1995 and 1996. Had aortic balloon valvuloplasty in 1974 and AVR in 1994. He was followed at the [facility] clinic on a yearly basis in the past. He is now on dialysis. Had multiple cardioversions in past for afib but now treated with rate control and anticoagulation. Holter monitor December 2016 showed atrial fibrillation with PVCs or aberrantly conducted beats rate controlled. Nuclear study 3/17 showed attenuation but no ischemia. Echo 3/17 showed normal LV function, moderate LVH, severe LAE, moderate RAE, prosthetic aortic valve with normal function, dilated aortic root. Chest CT 4/18 showed TAA 5.4 cm. Since last seen, patient has dyspnea on exertion. No orthopnea, PND or increased pedal edema. No chest pain.   Current Outpatient Prescriptions  Medication Sig Dispense Refill  . amoxicillin (AMOXIL) 500 MG capsule Take 500 mg by mouth daily as needed (1 hour prior to dental appts).     . ANDROGEL PUMP 20.25 MG/ACT (1.62%) GEL APPLY 2 PUMPS DAILY 75 g 1  . B Complex Vitamins (VITAMIN B COMPLEX) TABS Take 1 tablet by mouth daily.    Marland Kitchen glucosamine-chondroitin 500-400 MG tablet Take 1 tablet by mouth daily.    . Methoxy PEG-Epoetin Beta (MIRCERA IJ) Inject as directed every 21 ( twenty-one) days.    . metoprolol succinate (TOPROL-XL) 50 MG 24 hr tablet Take 1 tablet (50 mg total) by mouth daily. NEED OV. Take with or immediately following a meal. 90 tablet 0  . olmesartan (BENICAR) 40 MG tablet Take 1 tablet (40 mg total) by mouth at bedtime. (Patient taking differently: Take 20 mg by mouth as needed. ) 90 tablet 3  . oxyCODONE-acetaminophen (PERCOCET) 7.5-325 MG tablet Take 1 tablet by mouth every 6 (six) hours as needed for severe pain. 30 tablet 0  .  sevelamer carbonate (RENVELA) 800 MG tablet Take 800 mg by mouth 3 (three) times daily with meals.     . warfarin (COUMADIN) 7.5 MG tablet Take 7.5 mg by mouth daily at 6 PM.      No current facility-administered medications for this visit.      Past Medical History:  Diagnosis Date  . Anemia   . Anemia of renal disease 10/04/2011  . Aortic aneurysm ([place])   . Atrial fibrillation ([place])   . Atrial flutter ([place])   . AVD (aortic valve disease)   . CAD (coronary artery disease)   . CKD (chronic kidney disease)    baseline creatinine is 1.5-2  . Colonic ulcer 2007   felt to be secondary to NSAID  . Complex coarctation of the aorta   . Gastric polyp 2007, 06/2012   benign.  . Gout   . Hepatitis C   . HTN (hypertension)   . Hyperlipidemia   . Obesity   . PONV (postoperative nausea and vomiting)   . Renal insufficiency   . 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 Testicular adenoma     Past Surgical History:  Procedure Laterality Date  . AORTIC VALVE REPLACEMENT  1995   [facility]  . AORTIC VALVE REPLACEMENT AND MITRAL VALVE REPAIR  1974   Bicuspid aortic valve  . CARDIOVERSION  02/27/2012   Procedure: CARDIOVERSION;  Surgeon: Lelon Perla, MD;  Location: [place];  Service: Cardiovascular;  Laterality: N/A;  . CARDIOVERSION N/A 05/29/2013   Procedure: CARDIOVERSION;  Surgeon: Lelon Perla, MD;  Location: [facility] ENDOSCOPY;  Service: Cardiovascular;  Laterality: N/A;  . CARDIOVERSION N/A 12/15/2013   Procedure: CARDIOVERSION;  Surgeon: Sueanne Margarita, MD;  Location: [facility] ENDOSCOPY;  Service: Cardiovascular;  Laterality: N/A;  . CARDIOVERSION N/A 08/25/2014   Procedure: CARDIOVERSION;  Surgeon: Lelon Perla, MD;  Location: [facility] ENDOSCOPY;  Service: Cardiovascular;  Laterality: N/A;  . CARDIOVERSION N/A 02/17/2015   Procedure: CARDIOVERSION;  Surgeon: Larey Dresser, MD;  Location: [place];  Service: Cardiovascular;  Laterality: N/A;  . [place]  . COARCTATION OF AORTA REPAIR  1970  . ESOPHAGOGASTRODUODENOSCOPY N/A 07/04/2012   Procedure: ESOPHAGOGASTRODUODENOSCOPY (EGD);  Surgeon: Inda Castle, MD;  Location: [place];  Service: Endoscopy;  Laterality: N/A;  . ORCHIECTOMY  1992   testicular cancer  . TONSILLECTOMY      Social History   Social History  . Marital status: Married    Spouse name: N/A  . Number of children: 4  . Years of education: N/A   Occupational History  . Not on file.   Social History Main Topics  . Smoking status: Former Smoker    Packs/day: 0.25    Years: 7.00    Types: Cigarettes    Start date: 05/28/1975    Quit date: 05/27/1981  . Smokeless tobacco: Never Used     Comment: quit 25 years ago  . Alcohol use 0.0 oz/week     Comment: wine, occasionally  . Drug use: No  . Sexual activity: Not on file   Other Topics Concern  . Not on file   Social History Narrative   Pt lives in [place] with wife.  3 children ages 65,20,22 (all 3 are adopted)   Sales grinding wheels.    Family History  Problem Relation Age of Onset  . Heart disease      No family history  . Heart attack Neg Hx     ROS: no fevers or chills, productive cough, hemoptysis, dysphasia, odynophagia, melena, hematochezia, dysuria, hematuria, rash, seizure activity, orthopnea, PND, pedal edema, claudication. Remaining systems are negative.  Physical Exam: Well-developed obese in no acute distress.  Skin is warm and dry.  HEENT is normal.  Neck is supple.  Chest is clear to auscultation with normal expansion.  Cardiovascular exam is irregular, crisp mechanical valve sound  Abdominal exam nontender or distended. No masses palpated.Dialysis catheter in place.   Extremities show no edema. neuro grossly intact  ECG- Atrial fibrillation at a rate of 94. Incomplete left bundle branch block, PVC or aberrantly conducted beat. personally reviewed  A/P  1 thoracic aortic aneurysm-follow-up chest CT  April 2019.  2 hypertension-blood pressure controlled. Continue present medications.  3 history of coarctation-plan follow-up chest CTA April 2019.  4 coronary artery disease-no chest pain. No anticoagulation given need for Coumadin. Patient with history of liver disease and therefore not on a statin.  5 status post aortic valve replacement-continue Coumadin. Continue SBE prophylaxis.Patient is complaining of increased dyspnea on exertion. Repeat echocardiogram.    6 atrial fibrillation-now permanent. Continue metoprolol for rate control. Continue Coumadin.   7 possible claudication-patient complains of bilateral lower extremity weakness and states he had vascular disease in the past. Plan repeat ABIs with Doppler.    Kirk Ruths, MD

## 2016-08-09 ENCOUNTER — Ambulatory Visit (INDEPENDENT_AMBULATORY_CARE_PROVIDER_SITE_OTHER): Payer: Medicare Other | Admitting: Cardiology

## 2016-08-09 ENCOUNTER — Encounter: Payer: Self-pay | Admitting: Cardiology

## 2016-08-09 VITALS — BP 123/79 | HR 94 | Ht 74.0 in | Wt 333.1 lb

## 2016-08-09 DIAGNOSIS — R29898 Other symptoms and signs involving the musculoskeletal system: Secondary | ICD-10-CM

## 2016-08-09 DIAGNOSIS — I712 Thoracic aortic aneurysm, without rupture, unspecified: Secondary | ICD-10-CM

## 2016-08-09 DIAGNOSIS — R0600 Dyspnea, unspecified: Secondary | ICD-10-CM | POA: Diagnosis not present

## 2016-08-09 DIAGNOSIS — I359 Nonrheumatic aortic valve disorder, unspecified: Secondary | ICD-10-CM | POA: Diagnosis not present

## 2016-08-09 DIAGNOSIS — I480 Paroxysmal atrial fibrillation: Secondary | ICD-10-CM

## 2016-08-09 MED ORDER — METOPROLOL SUCCINATE ER 50 MG PO TB24: 50.0000 mg | ORAL_TABLET | Freq: Every day | ORAL | 3 refills | Status: DC

## 2016-08-09 MED ORDER — WARFARIN SODIUM 7.5 MG PO TABS: 7.5000 mg | ORAL_TABLET | Freq: Every day | ORAL | 3 refills | Status: AC

## 2016-08-09 NOTE — Patient Instructions (Signed)
Medication Instructions:   Refill sent to the pharmacy electronically.   Testing/Procedures:  Your physician has requested that you have an echocardiogram. Echocardiography is a painless test that uses sound waves to create images of your heart. It provides your doctor with information about the size and shape of your heart and how well your heart's chambers and valves are working. This procedure takes approximately one hour. There are no restrictions for this procedure.   Your physician has requested that you have a lower extremity arterial duplex. During this test, ultrasound are used to evaluate arterial blood flow in the legs. Allow one hour for this exam. There are no restrictions or special instructions.   Follow-Up:  Your physician wants you to follow-up in: [place] will receive a reminder letter in the mail two months in advance. If you don't receive a letter, please call our office to schedule the follow-up appointment.   If you need a refill on your cardiac medications before your next appointment, please call your pharmacy.

## 2016-08-23 ENCOUNTER — Ambulatory Visit: Payer: BLUE CROSS/BLUE SHIELD | Admitting: Cardiology

## 2016-08-27 IMAGING — DX DG CHEST 2V
2 series · 2 of 2 positions shown · non-contrast
Comparison: Chest radiograph 10/26/2014

CLINICAL DATA: Patient with atrial fibrillation.  No chest pain.

EXAM:
CHEST  2 VIEW

[w chest pa]
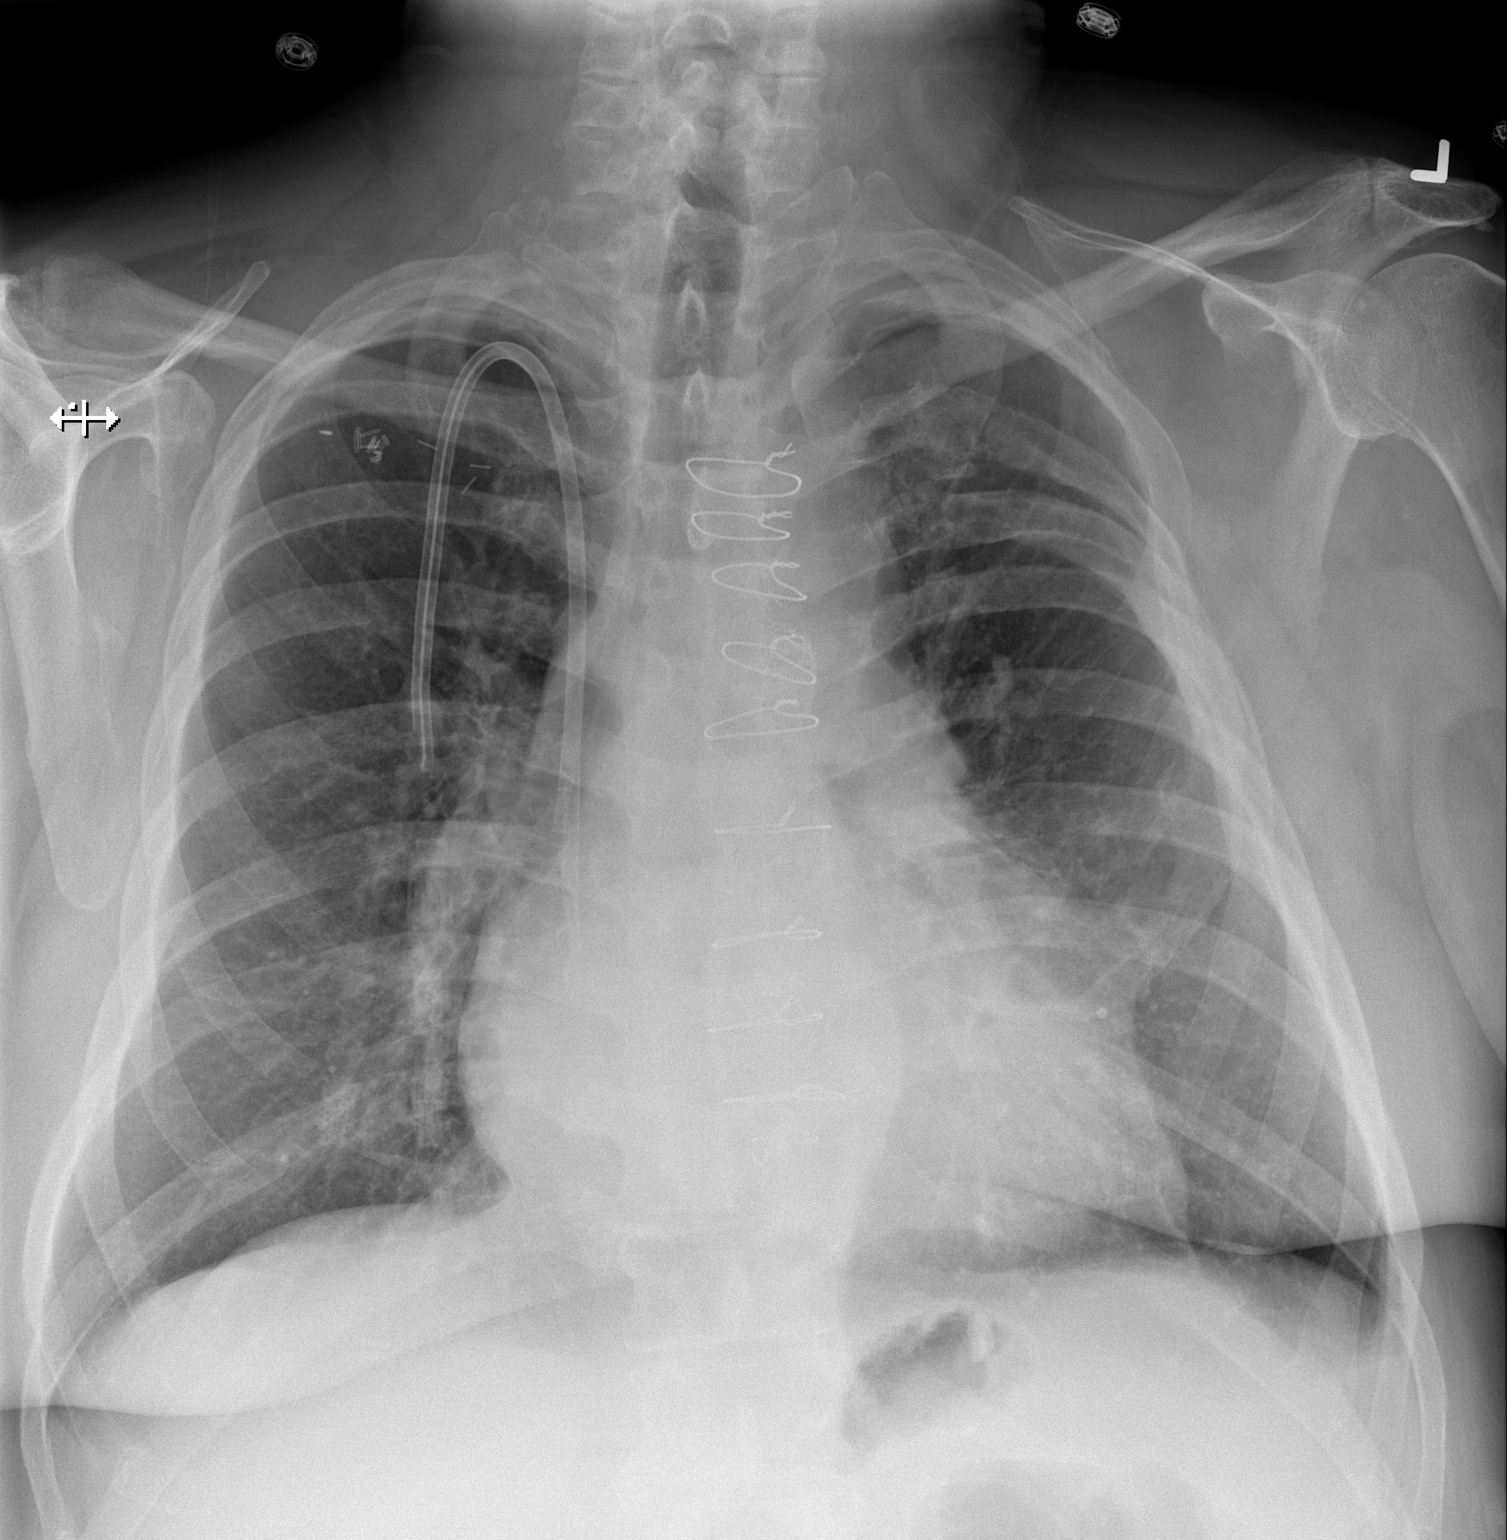

[w chest lat]
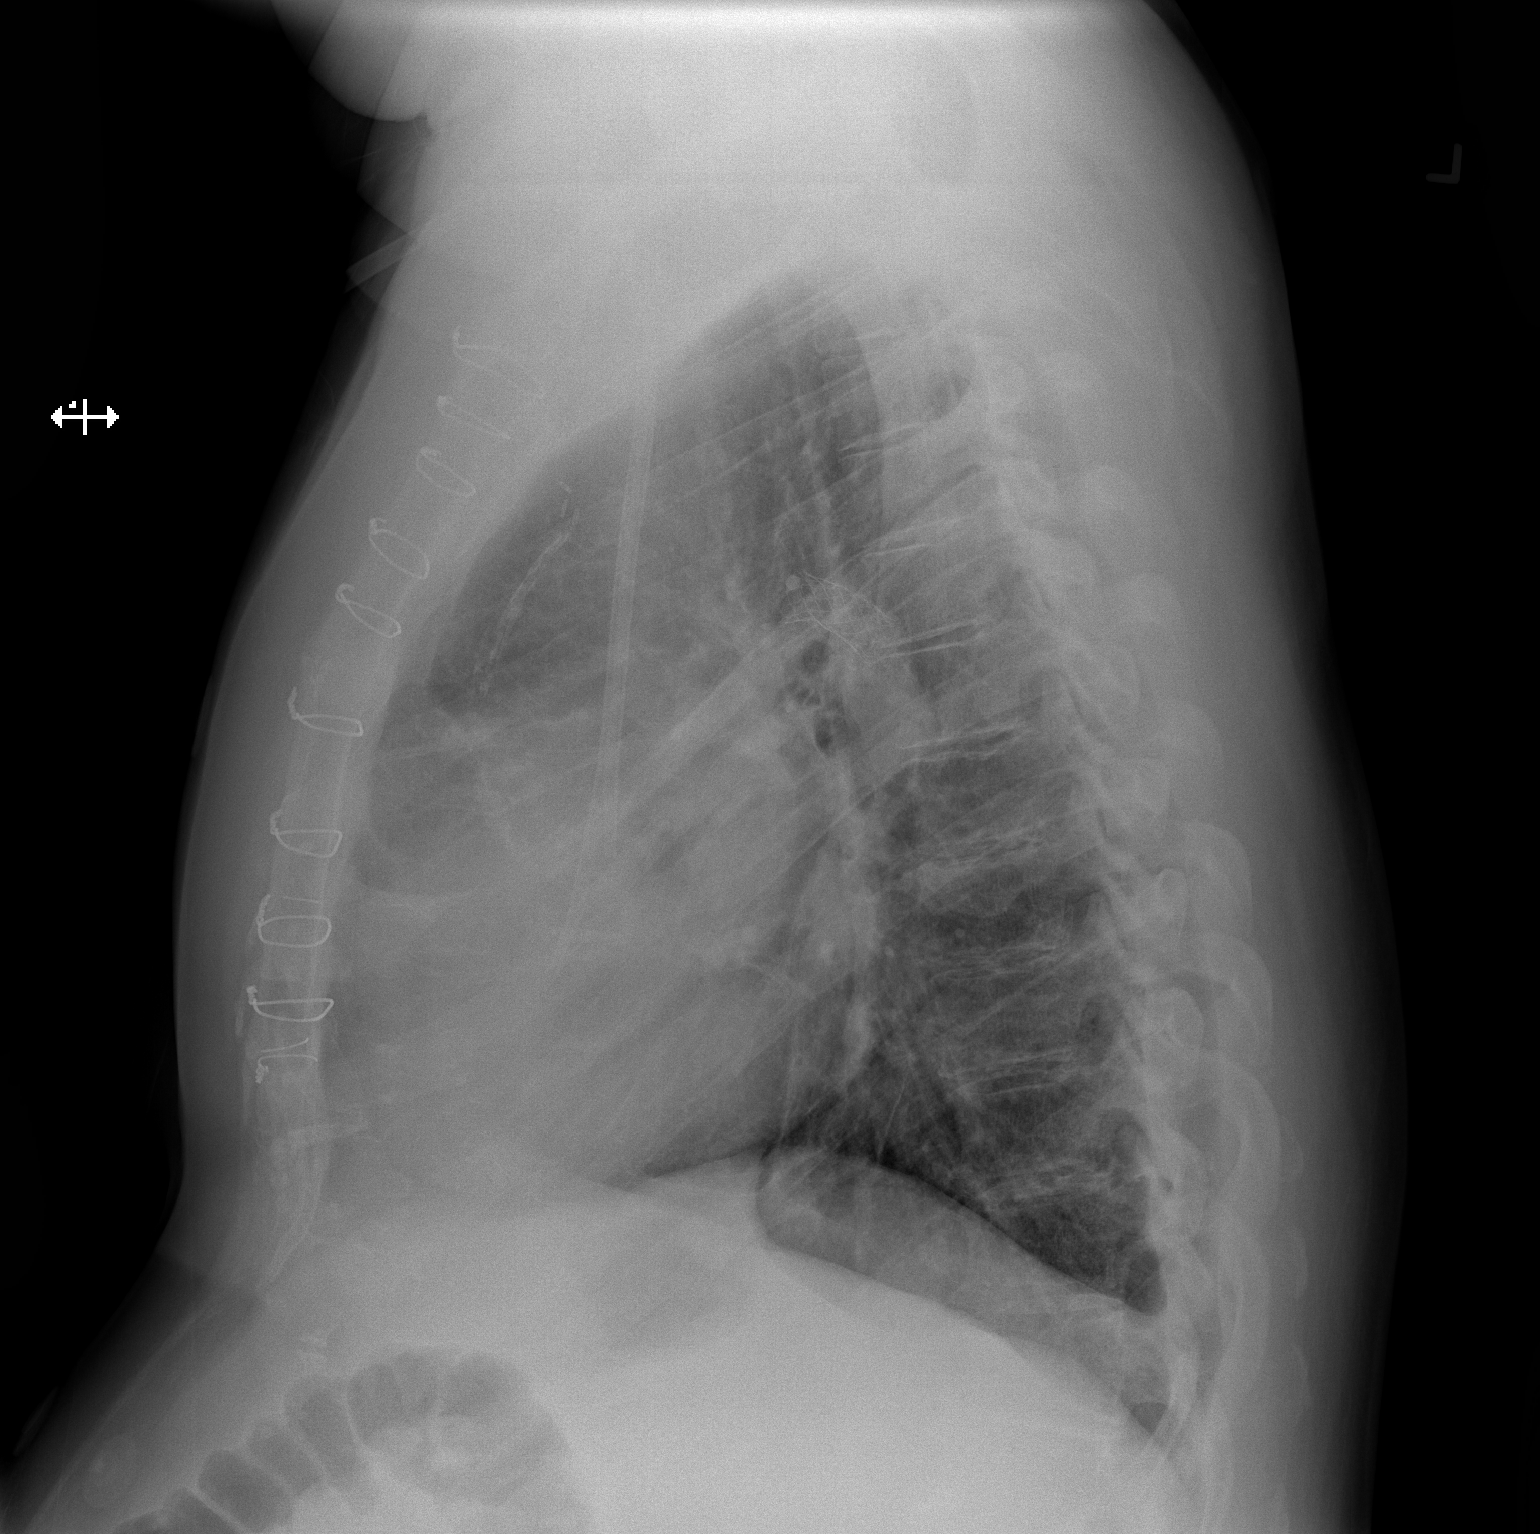

[2 of 2 positions shown; findings below may reference images not displayed]

FINDINGS: Central venous catheter tip projects over the superior vena cava,
stable. Right upper hemi thorax surgical clips. Stable cardiomegaly
status post median sternotomy. No consolidative pulmonary opacities.
No pleural effusion or pneumothorax. Mid thoracic spine degenerative
changes. Stent within the proximal descending thoracic aorta. Old
deformities involving multiple left lateral ribs.
IMPRESSION: Cardiomegaly.  No acute cardiopulmonary process.

## 2016-10-04 ENCOUNTER — Ambulatory Visit (HOSPITAL_BASED_OUTPATIENT_CLINIC_OR_DEPARTMENT_OTHER)
Admission: RE | Admit: 2016-10-04 | Discharge: 2016-10-04 | Disposition: A | Discharge status: Home / Self Care | Payer: Medicare Other | Source: Ambulatory Visit | Attending: Cardiology | Admitting: Cardiology

## 2016-10-04 DIAGNOSIS — R0600 Dyspnea, unspecified: Secondary | ICD-10-CM | POA: Diagnosis present

## 2016-10-06 ENCOUNTER — Encounter (HOSPITAL_COMMUNITY): Payer: Medicare Other

## 2016-10-18 ENCOUNTER — Ambulatory Visit (HOSPITAL_COMMUNITY)
Admission: RE | Admit: 2016-10-18 | Discharge: 2016-10-18 | Disposition: A | Discharge status: Home / Self Care | Payer: Medicare Other | Source: Ambulatory Visit | Attending: Cardiovascular Disease | Admitting: Cardiovascular Disease

## 2016-10-18 DIAGNOSIS — I739 Peripheral vascular disease, unspecified: Secondary | ICD-10-CM | POA: Diagnosis not present

## 2016-10-18 DIAGNOSIS — R29898 Other symptoms and signs involving the musculoskeletal system: Secondary | ICD-10-CM | POA: Diagnosis present

## 2016-10-18 DIAGNOSIS — R531 Weakness: Secondary | ICD-10-CM | POA: Diagnosis present

## 2016-10-26 ENCOUNTER — Telehealth: Payer: Self-pay | Admitting: *Deleted

## 2016-10-26 DIAGNOSIS — Q251 Coarctation of aorta: Secondary | ICD-10-CM

## 2016-10-26 NOTE — Telephone Encounter (Signed)
Per patient e mail, he is concerned by what the tech told him when he had the lower ext dopplers in regards to his coarctation repair of the abdominal aorta. Dr Stanford Breed has reviewed and would like the patient to have a CTA of the abdominal aorta and pelvis to r/o restenosis. Patient will call the [place] office to schedule. Left a message for the imaging department that the patient is on peritoneal dialysis and we need to use as little contrast as possible.

## 2016-10-31 ENCOUNTER — Ambulatory Visit (HOSPITAL_BASED_OUTPATIENT_CLINIC_OR_DEPARTMENT_OTHER)
Admission: RE | Admit: 2016-10-31 | Discharge: 2016-10-31 | Disposition: A | Discharge status: Home / Self Care | Payer: Medicare Other | Source: Ambulatory Visit | Attending: Cardiology | Admitting: Cardiology

## 2016-10-31 DIAGNOSIS — N186 End stage renal disease: Secondary | ICD-10-CM | POA: Diagnosis not present

## 2016-10-31 DIAGNOSIS — I700 Atherosclerosis of aorta: Secondary | ICD-10-CM | POA: Insufficient documentation

## 2016-10-31 DIAGNOSIS — B1920 Unspecified viral hepatitis C without hepatic coma: Secondary | ICD-10-CM | POA: Insufficient documentation

## 2016-10-31 DIAGNOSIS — Q251 Coarctation of aorta: Secondary | ICD-10-CM | POA: Insufficient documentation

## 2016-10-31 DIAGNOSIS — Z992 Dependence on renal dialysis: Secondary | ICD-10-CM | POA: Insufficient documentation

## 2016-10-31 MED ORDER — IOPAMIDOL (ISOVUE-370) INJECTION 76%
100.0000 mL | Freq: Once | INTRAVENOUS | Status: AC | PRN
  Administered 2016-10-31: 100 mL via INTRAVENOUS

## 2016-11-15 NOTE — Progress Notes (Addendum)
HPI: FU paroxysmal atrial fibrillation/flutter, s/p aortic valve replacement, aortic coarctation, aortic aneurysm, coronary artery disease on CT scan and CKD. Patient is s/p coarctation repair in 1958 and redo in 1971. Had endovascular stent in 1995 and 1996. Had aortic balloon valvuloplasty in 1974 and AVR in 1994. He was followed at the [facility] clinic on a yearly basis in the past. He is now on dialysis. Had multiple cardioversions in past for afib but now treated with rate control and anticoagulation. Holter monitor December 2016 showed atrial fibrillation with PVCs or aberrantly conducted beats rate controlled. Nuclear study 3/17 showed attenuation but no ischemia. Chest CT 4/18 showed TAA 5.4 cm. Ehocardiogram June 2018 showed normal LV function, prior aortic valve replacement with mean gradient of 17 mmHg, mild mitral regurgitation, biatrial enlargement. Abdominal CT July 2018 showed minimal atherosclerotic plaque in the abdominal aorta. Since last seen, patient complains of increased dyspnea on exertion. He denies orthopnea, PND, chest pain or increased pedal edema. No syncope. He describes bilateral lower extremity weakness with ambulation. He also complains of some pain in his upper back.  Current Outpatient Prescriptions  Medication Sig Dispense Refill  . amoxicillin (AMOXIL) 500 MG capsule Take 500 mg by mouth daily as needed (1 hour prior to dental appts).     . ANDROGEL PUMP 20.25 MG/ACT (1.62%) GEL APPLY 2 PUMPS DAILY 75 g 1  . B Complex Vitamins (VITAMIN B COMPLEX) TABS Take 1 tablet by mouth daily.    Marland Kitchen glucosamine-chondroitin 500-400 MG tablet Take 1 tablet by mouth daily.    . Methoxy PEG-Epoetin Beta (MIRCERA IJ) Inject as directed every 21 ( twenty-one) days. Aranesp    . metoprolol succinate (TOPROL-XL) 50 MG 24 hr tablet Take 1 tablet (50 mg total) by mouth daily. NEED OV. Take with or immediately following a meal. 90 tablet 3  . olmesartan (BENICAR) 40 MG tablet Take 1  tablet (40 mg total) by mouth at bedtime. (Patient taking differently: Take 20 mg by mouth as needed. ) 90 tablet 3  . oxyCODONE-acetaminophen (PERCOCET) 7.5-325 MG tablet Take 1 tablet by mouth every 6 (six) hours as needed for severe pain. 30 tablet 0  . pantoprazole (PROTONIX) 20 MG tablet Take 20 mg by mouth daily.    . sevelamer carbonate (RENVELA) 800 MG tablet Take 800 mg by mouth 3 (three) times daily with meals.     Marland Kitchen testosterone cypionate (DEPOTESTOSTERONE CYPIONATE) 200 MG/ML injection INJECT 1 ML IN THE MUSCLE EVERY 14 DAYS  0  . warfarin (COUMADIN) 7.5 MG tablet Take 1 tablet (7.5 mg total) by mouth daily at 6 PM. 90 tablet 3   No current facility-administered medications for this visit.      Past Medical History:  Diagnosis Date  . Anemia   . Anemia of renal disease 10/04/2011  . Aortic aneurysm ([place])   . Atrial fibrillation ([place])   . Atrial flutter ([place])   . AVD (aortic valve disease)   . CAD (coronary artery disease)   . CKD (chronic kidney disease)    baseline creatinine is 1.5-2  . Colonic ulcer 2007   felt to be secondary to NSAID  . Complex coarctation of the aorta   . Gastric polyp 2007, 06/2012   benign.  . Gout   . Hepatitis C   . HTN (hypertension)   . Hyperlipidemia   . Obesity   . PONV (postoperative nausea and vomiting)   . Renal insufficiency   . 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 Testicular adenoma     Past Surgical History:  Procedure Laterality Date  . AORTIC VALVE REPLACEMENT  [facility]   [facility]  . AORTIC VALVE REPLACEMENT AND MITRAL VALVE REPAIR  1974   Bicuspid aortic valve  . CARDIOVERSION  02/27/2012   Procedure: CARDIOVERSION;  Surgeon: Lelon Perla, MD;  Location: [facility] ENDOSCOPY;  Service: Cardiovascular;  Laterality: N/A;  . CARDIOVERSION N/A 05/29/2013   Procedure: CARDIOVERSION;  Surgeon: Lelon Perla, MD;  Location: [facility] ENDOSCOPY;  Service: Cardiovascular;  Laterality: N/A;  . CARDIOVERSION N/A  12/15/2013   Procedure: CARDIOVERSION;  Surgeon: Sueanne Margarita, MD;  Location: [facility] ENDOSCOPY;  Service: Cardiovascular;  Laterality: N/A;  . CARDIOVERSION N/A 08/25/2014   Procedure: CARDIOVERSION;  Surgeon: Lelon Perla, MD;  Location: [facility] ENDOSCOPY;  Service: Cardiovascular;  Laterality: N/A;  . CARDIOVERSION N/A 02/17/2015   Procedure: CARDIOVERSION;  Surgeon: Larey Dresser, MD;  Location: [place];  Service: Cardiovascular;  Laterality: N/A;  . [place]  . COARCTATION OF AORTA REPAIR  1970  . ESOPHAGOGASTRODUODENOSCOPY N/A 07/04/2012   Procedure: ESOPHAGOGASTRODUODENOSCOPY (EGD);  Surgeon: Inda Castle, MD;  Location: [place];  Service: Endoscopy;  Laterality: N/A;  . ORCHIECTOMY  1992   testicular cancer  . TONSILLECTOMY      Social History   Social History  . Marital status: Married    Spouse name: N/A  . Number of children: 4  . Years of education: N/A   Occupational History  . Not on file.   Social History Main Topics  . Smoking status: Former Smoker    Packs/day: 0.25    Years: 7.00    Types: Cigarettes    Start date: 05/28/1975    Quit date: 05/27/1981  . Smokeless tobacco: Never Used     Comment: quit 25 years ago  . Alcohol use 0.0 oz/week     Comment: wine, occasionally  . Drug use: No  . Sexual activity: Not on file   Other Topics Concern  . Not on file   Social History Narrative   Pt lives in [place] with wife.  3 children ages 62,20,22 (all 3 are adopted)   Sales grinding wheels.    Family History  Problem Relation Age of Onset  . Heart disease Unknown        No family history  . Heart attack Neg Hx     ROS: no fevers or chills, productive cough, hemoptysis, dysphasia, odynophagia, melena, hematochezia, dysuria, hematuria, rash, seizure activity, orthopnea, PND, pedal edema, claudication. Remaining systems are negative.  Physical Exam: Well-developed well-nourished in no acute distress.  Skin is warm and  dry.  HEENT is normal.  Neck is supple.  Chest is clear to auscultation with normal expansion.  Cardiovascular exam is irregular, crisp mechanical valve sound Abdominal exam nontender or distended. No masses palpated. Extremities show trace edema. neuro grossly intact  ECG- Atrial fibrillation at a rate of 97. IVCD. Lateral T-wave inversion. personally reviewed  A/P  1 Status post aortic valve replacement-continue Coumadin and SBE prophylaxis.  2 permanent atrial fibrillation-continue metoprolol for rate control. Continue Coumadin.  3 coronary artery disease-patient is not on a statin given history of liver disease.  4 thoracic aortic aneurysm-follow-up chest CT April 2019.  5 history of coarctation-follow-up chest CT April 2019. Patient is describing increased lower extremity weakness with ambulation and pain in his arms/shoulders at times. He does have diminished pulse  on the left and in his lower extremities. He feels as though this is vascular. His CTA in April showed stent graft in proximal descending thoracic aorta widely patent. There was also no significant obstruction to his lower extremities. It is not clear to me that this is a vascular issue but I will review with colleagues. Etiology of dyspnea also unclear. His dry weight may need to be reduced with dialysis. Note his echocardiogram showed normal LV function and normally functioning prosthetic aortic valve.  6 hypertension-blood pressure controlled. Continue present medications.  7 end-stage renal disease-dialysis per nephrology.  30 min spent discussing symptoms and fu with pt and wife  Kirk Ruths, MD  I have reviewed the pt's thoracic CTA with Dr Sallyanne Kuster and Dr Johnsie Cancel; the aortic diameter at previously placed stent appears to be small with some narrowing distal to the stent as well. Pt likely needs catheterization to measure gradient. I have recommended evaluation at [facility] or return to [facility] clinic where  previous procedures were performed. Discussed with pt. He will contact us with final decision concerning where he would like to be evaluated. Kirk Ruths

## 2016-11-29 ENCOUNTER — Encounter: Payer: Self-pay | Admitting: Cardiology

## 2016-11-29 ENCOUNTER — Ambulatory Visit (INDEPENDENT_AMBULATORY_CARE_PROVIDER_SITE_OTHER): Payer: Medicare Other | Admitting: Cardiology

## 2016-11-29 VITALS — BP 131/67 | HR 97 | Ht 74.0 in | Wt 329.8 lb

## 2016-11-29 DIAGNOSIS — I712 Thoracic aortic aneurysm, without rupture, unspecified: Secondary | ICD-10-CM

## 2016-11-29 DIAGNOSIS — R0602 Shortness of breath: Secondary | ICD-10-CM | POA: Diagnosis not present

## 2016-11-29 DIAGNOSIS — Z952 Presence of prosthetic heart valve: Secondary | ICD-10-CM | POA: Diagnosis not present

## 2016-11-29 DIAGNOSIS — I4891 Unspecified atrial fibrillation: Secondary | ICD-10-CM | POA: Diagnosis not present

## 2016-11-29 DIAGNOSIS — R0600 Dyspnea, unspecified: Secondary | ICD-10-CM

## 2016-11-29 NOTE — Patient Instructions (Signed)
Your physician recommends that you schedule a follow-up appointment in: [place]

## 2016-12-07 ENCOUNTER — Telehealth: Payer: Self-pay | Admitting: *Deleted

## 2016-12-07 NOTE — Telephone Encounter (Signed)
Patient has an appointment to see dr Mitzi Hansen wand at [facility] 01-18-17 @ 3pm per dr Stanford Breed for hx of coarctation with lower ext weakness. Records and CD's of the recent CT scans will be mailed to [address], [place] [phone].

## 2017-01-10 ENCOUNTER — Ambulatory Visit: Payer: Medicare Other | Admitting: Cardiology

## 2017-08-08 ENCOUNTER — Encounter (INDEPENDENT_AMBULATORY_CARE_PROVIDER_SITE_OTHER): Payer: Self-pay

## 2017-08-17 ENCOUNTER — Encounter (INDEPENDENT_AMBULATORY_CARE_PROVIDER_SITE_OTHER): Payer: Self-pay

## 2017-08-20 ENCOUNTER — Encounter (INDEPENDENT_AMBULATORY_CARE_PROVIDER_SITE_OTHER): Payer: Self-pay

## 2017-10-12 ENCOUNTER — Encounter: Payer: Self-pay | Admitting: Cardiology

## 2017-10-12 ENCOUNTER — Other Ambulatory Visit: Payer: Self-pay | Admitting: *Deleted

## 2017-10-12 ENCOUNTER — Encounter: Payer: Self-pay | Admitting: *Deleted

## 2017-10-12 MED ORDER — METOPROLOL SUCCINATE ER 25 MG PO TB24: 25.0000 mg | ORAL_TABLET | Freq: Every day | ORAL | 3 refills | Status: DC

## 2017-10-12 NOTE — Telephone Encounter (Signed)
This encounter was created in error - please disregard.

## 2017-10-15 ENCOUNTER — Telehealth: Payer: Self-pay | Admitting: Cardiology

## 2017-10-15 MED ORDER — METOPROLOL SUCCINATE ER 25 MG PO TB24: 50.0000 mg | ORAL_TABLET | Freq: Every day | ORAL | 3 refills | Status: DC

## 2017-10-15 NOTE — Telephone Encounter (Signed)
Spoke with pt, Aware of dr crenshaw's recommendations.  °

## 2017-10-15 NOTE — Telephone Encounter (Signed)
Change toprol to 50 mg daily; follow BP and HR; call if BP does not tolerate increase [name]

## 2017-10-15 NOTE — Telephone Encounter (Signed)
Spoke with pt, he has been in and out of [facility] due to dehydration and electrolyte imbalances related to dialysis and is now doing OT and PT at home. For the last 2 weeks he has noticed his heart rate is elevating with exercise. He is doing all the exercises in the chair. He reports his usual bp is 118-125/68-72 with pulse 98-105. Today he walked down 15 steps, back up the stairs and across the floor and his heart rate spiked to 172. He denies chest pain but reports SOB an 8 out of 10. He takes metoprolol succ 25 mg once daily in the morning. He has a follow up in [place] in august. Will forward for dr Stanford Breed review

## 2017-10-15 NOTE — Telephone Encounter (Signed)
New Message:       STAT if HR is under 50 or over 120 (normal HR is 60-100 beats per minute)  1) What is your heart rate? 190  2) Do you have a log of your heart rate readings (document readings)? No  3) Do you have any other symptoms? SOB    Amy from [facility] is stating that this was the first time the pt was doing the Physical Therapy with the stair training. She states it took the pt 2 min to come down below 100. She also states that it is starting to get harder for the pt to walk. Pt has increased his metoprolol to 25 mg

## 2017-10-24 ENCOUNTER — Encounter: Payer: Self-pay | Admitting: Cardiology

## 2017-10-28 ENCOUNTER — Encounter: Payer: Self-pay | Admitting: Cardiology

## 2017-10-29 ENCOUNTER — Other Ambulatory Visit: Payer: Self-pay | Admitting: *Deleted

## 2017-10-29 MED ORDER — METOPROLOL SUCCINATE ER 50 MG PO TB24: 50.0000 mg | ORAL_TABLET | Freq: Every day | ORAL | 3 refills | Status: DC

## 2017-11-05 ENCOUNTER — Encounter: Payer: Self-pay | Admitting: Cardiology

## 2017-11-05 DIAGNOSIS — I4891 Unspecified atrial fibrillation: Secondary | ICD-10-CM

## 2017-11-08 ENCOUNTER — Encounter: Payer: Self-pay | Admitting: Cardiology

## 2017-11-08 ENCOUNTER — Telehealth (HOSPITAL_COMMUNITY): Payer: Self-pay

## 2017-11-08 NOTE — Telephone Encounter (Signed)
Encounter complete. 

## 2017-11-09 ENCOUNTER — Ambulatory Visit (HOSPITAL_COMMUNITY)
Admission: RE | Admit: 2017-11-09 | Discharge: 2017-11-09 | Disposition: A | Discharge status: Home / Self Care | Payer: Medicare Other | Source: Ambulatory Visit | Attending: Cardiology | Admitting: Cardiology

## 2017-11-09 ENCOUNTER — Encounter: Payer: Self-pay | Admitting: Cardiology

## 2017-11-09 ENCOUNTER — Ambulatory Visit (INDEPENDENT_AMBULATORY_CARE_PROVIDER_SITE_OTHER): Payer: Medicare Other | Admitting: Cardiology

## 2017-11-09 ENCOUNTER — Telehealth: Payer: Self-pay | Admitting: *Deleted

## 2017-11-09 ENCOUNTER — Encounter (HOSPITAL_COMMUNITY): Payer: Self-pay | Admitting: *Deleted

## 2017-11-09 DIAGNOSIS — I4891 Unspecified atrial fibrillation: Secondary | ICD-10-CM

## 2017-11-09 DIAGNOSIS — I480 Paroxysmal atrial fibrillation: Secondary | ICD-10-CM

## 2017-11-09 NOTE — Progress Notes (Unsigned)
Abnormal ETT was reviewed by Dr. Ellyn Hack.

## 2017-11-09 NOTE — Telephone Encounter (Signed)
LATE ENTRY --TECH  - ([facility]EN) SPOKE WITH  DR HARDING , ABOUT PATIENT'S ETT TEST TODAY.

## 2017-11-10 NOTE — Progress Notes (Signed)
Vital signs noted on GXT evaluation  1. Paroxysmal atrial fibrillation ([place])     Brief clinic note.  Mr. Fornwalt presented here for a treadmill stress test on beta-blocker.  I was asked to see him by the technician as he clearly did not have adequate beta-blockade.  His heart rate went into the 200 range and what appears to be possibly rapid A. fib during the first minute or so of exercise.  He subsequently stopped exercising having noted significant dyspnea.  He then gradually reduce his heart rate down into the more stable range in the 90s with what appears now to be restoration of sinus rhythm.  Mr. 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  He has been seen by letter physiology and thought not to be a good ablation candidate.  At this point I think he probably will require antiarrhythmic agent and I discussed this with him.  He has had significant exertional dyspnea with tachycardia on minimal exertion.  I will increase his metoprolol to 50 mg Toprol twice daily from 50 mg a day to allow for more broad coverage but to avoid the hypotension on higher dose.  He will need to follow-up with Dr. Stanford Breed to discuss further treatment options but would likely require full EP consultation to consider antiarrhythmic agents.  With his renal history, would probably not be a good candidate for amiodarone.   Glenetta Hew, MD

## 2017-11-13 LAB — EXERCISE TOLERANCE TEST
CHL RATE OF PERCEIVED EXERTION: 19
CSEPEDS: 41 s
CSEPHR: 125 %
CSEPPHR: 200 {beats}/min
Estimated workload: 2.2 METS
Exercise duration (min): 0 min
MPHR: 160 {beats}/min
Rest HR: 98 {beats}/min

## 2017-11-23 ENCOUNTER — Encounter: Payer: Self-pay | Admitting: Cardiology

## 2017-12-24 ENCOUNTER — Other Ambulatory Visit: Payer: Self-pay | Admitting: *Deleted

## 2017-12-24 MED ORDER — METOPROLOL SUCCINATE ER 50 MG PO TB24: 50.0000 mg | ORAL_TABLET | Freq: Two times a day (BID) | ORAL | 3 refills | Status: DC

## 2017-12-24 NOTE — Progress Notes (Signed)
HPI: FU paroxysmal atrial fibrillation/flutter, s/p aortic valve replacement, aortic coarctation, aortic aneurysm, coronary artery disease on CT scan and CKD. Patient is s/p coarctation repair in 1958 and redo in 1971. Had endovascular stent in 1995 and 1996. Had aortic balloon valvuloplasty in 1974 and AVR in 1994. He was followed at the [facility] clinic on a yearly basis in the past. He is now on dialysis. Had multiple cardioversions in past for afib but now treated with rate control and anticoagulation.Holter monitor December 2016 showed atrial fibrillation with PVCs or aberrantly conducted beats rate controlled. Nuclear study 3/17 showed attenuation but no ischemia. Abdominal CT July 2018 showed minimal atherosclerotic plaque in the abdominal aorta.   Echocardiogram January 2019 at [facility] showed normal LV function, moderate left ventricular hypertrophy, moderate left atrial enlargement, mild mitral and tricuspid regurgitation, s/p AVR, severely dilated ascending aorta.  CTA April 2019 showed 5.6 cm ascending aortic aneurysm unchanged.  There was a vascular stent in the proximal thoracic aorta.  Exercise tolerance test July 2019 showed exaggerated heart rate response and beta-blocker increased.  Since last seen, patient has some dyspnea on exertion but no orthopnea, PND, chest pain or syncope.  Minimal pedal edema.  He is on peritoneal dialysis.  Current Outpatient Medications  Medication Sig Dispense Refill  . amoxicillin (AMOXIL) 500 MG capsule Take 500 mg by mouth daily as needed (1 hour prior to dental appts).     . B Complex Vitamins (VITAMIN B COMPLEX) TABS Take 1 tablet by mouth daily.    Marland Kitchen glucosamine-chondroitin 500-400 MG tablet Take 1 tablet by mouth daily.    . Methoxy PEG-Epoetin Beta (MIRCERA IJ) Inject as directed every 21 ( twenty-one) days. Aranesp    . metoprolol succinate (TOPROL-XL) 50 MG 24 hr tablet Take 1 tablet (50 mg total) by mouth 2 (two) times daily. Take with or  immediately following a meal. 180 tablet 3  . olmesartan (BENICAR) 20 MG tablet Take 20 mg by mouth daily as needed.    . pantoprazole (PROTONIX) 20 MG tablet Take 20 mg by mouth daily.    . sevelamer carbonate (RENVELA) 800 MG tablet Take 800 mg by mouth 3 (three) times daily with meals.     . warfarin (COUMADIN) 7.5 MG tablet Take 1 tablet (7.5 mg total) by mouth daily at 6 PM. 90 tablet 3   No current facility-administered medications for this visit.      Past Medical History:  Diagnosis Date  . Anemia   . Anemia of renal disease 10/04/2011  . Aortic aneurysm ([place])   . Atrial fibrillation ([place])   . Atrial flutter ([place])   . AVD (aortic valve disease)   . Bilateral lower extremity edema 07/03/2012  . CAD (coronary artery disease)   . Chronic anticoagulation 07/10/2012  . CKD (chronic kidney disease)    baseline creatinine is 1.5-2  . CKD (chronic kidney disease) stage 3, GFR 30-59 ml/min (HCC) 07/02/2012  . Coarctation of aorta (previous repair and stent) 04/05/2011  . Colonic ulcer 2007   felt to be secondary to NSAID  . Complex coarctation of the aorta   . Gastric polyp 2007, 06/2012   benign.  . Gout   . Gout 07/03/2012  . Hepatitis C   . History of atrial flutter s/p DCCV 04/05/2011  . HTN (hypertension)   . Hyperlipidemia   . Hypertension 04/05/2011  . Long term (current) use of anticoagulants 12/12/2013  . Obesity   . PONV (postoperative nausea  and vomiting)   . Renal insufficiency   . S/P aortic valve replacement 04/05/2011  . 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 Testicular adenoma   . Thoracic aortic aneurysm ([place]) 04/05/2011    Past Surgical History:  Procedure Laterality Date  . AORTIC VALVE REPLACEMENT  [facility]   [facility]  . AORTIC VALVE REPLACEMENT AND MITRAL VALVE REPAIR  1974   Bicuspid aortic valve  . CARDIOVERSION  02/27/2012   Procedure: CARDIOVERSION;  Surgeon: Lelon Perla, MD;  Location: [facility] ENDOSCOPY;  Service: Cardiovascular;   Laterality: N/A;  . CARDIOVERSION N/A 05/29/2013   Procedure: CARDIOVERSION;  Surgeon: Lelon Perla, MD;  Location: [facility] ENDOSCOPY;  Service: Cardiovascular;  Laterality: N/A;  . CARDIOVERSION N/A 12/15/2013   Procedure: CARDIOVERSION;  Surgeon: Sueanne Margarita, MD;  Location: [facility] ENDOSCOPY;  Service: Cardiovascular;  Laterality: N/A;  . CARDIOVERSION N/A 08/25/2014   Procedure: CARDIOVERSION;  Surgeon: Lelon Perla, MD;  Location: [facility] ENDOSCOPY;  Service: Cardiovascular;  Laterality: N/A;  . CARDIOVERSION N/A 02/17/2015   Procedure: CARDIOVERSION;  Surgeon: Larey Dresser, MD;  Location: [place];  Service: Cardiovascular;  Laterality: N/A;  . [place]  . COARCTATION OF AORTA REPAIR  1970  . ESOPHAGOGASTRODUODENOSCOPY N/A 07/04/2012   Procedure: ESOPHAGOGASTRODUODENOSCOPY (EGD);  Surgeon: Inda Castle, MD;  Location: [place];  Service: Endoscopy;  Laterality: N/A;  . ORCHIECTOMY  1992   testicular cancer  . TONSILLECTOMY      Social History   Socioeconomic History  . Marital status: Married    Spouse name: Not on file  . Number of children: 4  . Years of education: Not on file  . Highest education level: Not on file  Occupational History  . Not on file  Social Needs  . Financial resource strain: Not on file  . Food insecurity:    Worry: Not on file    Inability: Not on file  . Transportation needs:    Medical: Not on file    Non-medical: Not on file  Tobacco Use  . Smoking status: Former Smoker    Packs/day: 0.25    Years: 7.00    Pack years: 1.75    Types: Cigarettes    Start date: 05/28/1975    Last attempt to quit: 05/27/1981    Years since quitting: 36.6  . Smokeless tobacco: Never Used  . Tobacco comment: quit 25 years ago  Substance and Sexual Activity  . Alcohol use: Yes    Alcohol/week: 0.0 standard drinks    Comment: wine, occasionally  . Drug use: No  . Sexual activity: Not on file  Lifestyle  . Physical activity:     Days per week: Not on file    Minutes per session: Not on file  . Stress: Not on file  Relationships  . Social connections:    Talks on phone: Not on file    Gets together: Not on file    Attends religious service: Not on file    Active member of club or organization: Not on file    Attends meetings of clubs or organizations: Not on file    Relationship status: Not on file  . Intimate partner violence:    Fear of current or ex partner: Not on file    Emotionally abused: Not on file    Physically abused: Not on file    Forced sexual activity: Not on file  Other Topics Concern  .  Not on file  Social History Narrative   Pt lives in [place] with wife.  3 children ages 22,20,22 (all 3 are adopted)   Sales grinding wheels.    Family History  Problem Relation Age of Onset  . Heart disease Unknown        No family history  . Heart attack Neg Hx     ROS: no fevers or chills, productive cough, hemoptysis, dysphasia, odynophagia, melena, hematochezia, dysuria, hematuria, rash, seizure activity, orthopnea, PND, pedal edema, claudication. Remaining systems are negative.  Physical Exam: Well-developed obese in no acute distress.  Skin is warm and dry.  HEENT is normal.  Neck is supple.  Chest is clear to auscultation with normal expansion.  Cardiovascular exam is regular rate and rhythm.  Abdominal exam nontender or distended. No masses palpated. Extremities show no edema. neuro grossly intact  ECG-possible ectopic atrial tachycardia, nonspecific ST changes, lateral T wave inversion.  Personally reviewed  A/P  1 permanent atrial fibrillation-plan to continue metoprolol for rate control.  His rate was increased recently.  I will check 24-hour Holter monitor to make sure that he is adequately controlled.  Continue Coumadin.  2 status post aortic valve replacement-continue Coumadin and add aspirin 81 mg daily.  Continue SBE prophylaxis.  3 thoracic aortic aneurysm-plan follow-up  chest CTA April 2020.  4 coronary artery disease-patient is not on a statin given history of liver disease.  5 hypertension-blood pressure is controlled.  Continue present medications.  6 history of coarctation-plan follow-up chest CTA April 2020.  He was evaluated at [facility] and coarctation was felt not to be contributing to his symptoms.  7 end-stage renal disease-continue dialysis per nephrology.  Kirk Ruths, MD

## 2017-12-26 ENCOUNTER — Ambulatory Visit (INDEPENDENT_AMBULATORY_CARE_PROVIDER_SITE_OTHER): Payer: Medicare Other | Admitting: Cardiology

## 2017-12-26 ENCOUNTER — Encounter: Payer: Self-pay | Admitting: Cardiology

## 2017-12-26 VITALS — BP 119/70 | HR 92 | Ht 74.0 in | Wt 318.0 lb

## 2017-12-26 DIAGNOSIS — I10 Essential (primary) hypertension: Secondary | ICD-10-CM | POA: Diagnosis not present

## 2017-12-26 DIAGNOSIS — I359 Nonrheumatic aortic valve disorder, unspecified: Secondary | ICD-10-CM

## 2017-12-26 DIAGNOSIS — I480 Paroxysmal atrial fibrillation: Secondary | ICD-10-CM

## 2017-12-26 DIAGNOSIS — I2510 Atherosclerotic heart disease of native coronary artery without angina pectoris: Secondary | ICD-10-CM

## 2017-12-26 MED ORDER — ASPIRIN EC 81 MG PO TBEC: 81.0000 mg | DELAYED_RELEASE_TABLET | Freq: Every day | ORAL | 3 refills | Status: AC

## 2017-12-26 NOTE — Patient Instructions (Signed)
Medication Instructions:   START ASPIRIN 81 MG ONCE DAILY  Testing/Procedures:  Your physician has recommended that you wear a 48 HOUR holter monitor. Holter monitors are medical devices that record the heart's electrical activity. Doctors most often use these monitors to diagnose arrhythmias. Arrhythmias are problems with the speed or rhythm of the heartbeat. The monitor is a small, portable device. You can wear one while you do your normal daily activities. This is usually used to diagnose what is causing palpitations/syncope (passing out).    Follow-Up:  Your physician recommends that you schedule a follow-up appointment in: [place]   If you need a refill on your cardiac medications before your next appointment, please call your pharmacy.

## 2018-01-07 ENCOUNTER — Ambulatory Visit (INDEPENDENT_AMBULATORY_CARE_PROVIDER_SITE_OTHER): Payer: Medicare Other

## 2018-01-07 DIAGNOSIS — I480 Paroxysmal atrial fibrillation: Secondary | ICD-10-CM | POA: Diagnosis not present

## 2018-03-13 NOTE — Progress Notes (Signed)
Patient presented for scheduled appointment.  While waiting he stated he felt ill and could not stay for his appointment.  He left without being seen. Kirk Ruths, MD

## 2018-03-20 ENCOUNTER — Telehealth: Payer: Self-pay | Admitting: *Deleted

## 2018-03-20 ENCOUNTER — Encounter: Payer: Self-pay | Admitting: Cardiology

## 2018-03-20 ENCOUNTER — Ambulatory Visit (INDEPENDENT_AMBULATORY_CARE_PROVIDER_SITE_OTHER): Payer: Medicare Other | Admitting: Cardiology

## 2018-03-20 DIAGNOSIS — I2510 Atherosclerotic heart disease of native coronary artery without angina pectoris: Secondary | ICD-10-CM

## 2018-03-20 NOTE — Telephone Encounter (Signed)
Patient had an appointment today with dr Stanford Breed, he came to the office and reported he felt bad and has for 3 weeks. His wife was with him and reported he had bronchitis, has not been sleeping because of cough and he had vomited prior to coming to the appointment. He was worked up and placed in a room. His wife came to the desk and reported the patient feels too bad to wait and is wanting to leave. Spoke with the patient and he reports he feels miserable and can not wait to be seen. He said he needed to go home and take a nap. The patient and his wife left the office before seeing dr Stanford Breed.

## 2018-06-01 DEATH — deceased

## 2018-10-09 ENCOUNTER — Other Ambulatory Visit: Payer: Self-pay | Admitting: *Deleted

## 2018-10-09 ENCOUNTER — Encounter: Payer: Self-pay | Admitting: *Deleted

## 2018-10-09 DIAGNOSIS — I712 Thoracic aortic aneurysm, without rupture, unspecified: Secondary | ICD-10-CM

## 2018-10-16 ENCOUNTER — Ambulatory Visit (HOSPITAL_BASED_OUTPATIENT_CLINIC_OR_DEPARTMENT_OTHER)
Admission: RE | Admit: 2018-10-16 | Discharge: 2018-10-16 | Disposition: A | Discharge status: Home / Self Care | Payer: Medicare Other | Source: Ambulatory Visit | Attending: Cardiology | Admitting: Cardiology

## 2018-10-16 ENCOUNTER — Other Ambulatory Visit: Payer: Self-pay

## 2018-10-16 DIAGNOSIS — I712 Thoracic aortic aneurysm, without rupture, unspecified: Secondary | ICD-10-CM

## 2018-10-16 MED ORDER — IOHEXOL 350 MG/ML SOLN
100.0000 mL | Freq: Once | INTRAVENOUS | Status: AC | PRN
  Administered 2018-10-16: 100 mL via INTRAVENOUS

## 2018-12-30 ENCOUNTER — Other Ambulatory Visit: Payer: Self-pay | Admitting: Cardiology

## 2019-03-25 ENCOUNTER — Other Ambulatory Visit (HOSPITAL_COMMUNITY): Payer: Self-pay

## 2019-03-25 ENCOUNTER — Inpatient Hospital Stay
Admission: AD | Admit: 2019-03-25 | Discharge: 2019-04-07 | Disposition: A | Discharge status: Rehabilitation Facility | Payer: Self-pay | Source: Other Acute Inpatient Hospital | Attending: Internal Medicine | Admitting: Internal Medicine

## 2019-03-25 DIAGNOSIS — K659 Peritonitis, unspecified: Secondary | ICD-10-CM

## 2019-03-25 DIAGNOSIS — J189 Pneumonia, unspecified organism: Secondary | ICD-10-CM

## 2019-03-25 DIAGNOSIS — K567 Ileus, unspecified: Secondary | ICD-10-CM

## 2019-03-25 LAB — CBC WITH DIFFERENTIAL/PLATELET
Abs Immature Granulocytes: 0.17 10*3/uL — ABNORMAL HIGH (ref 0.00–0.07)
Basophils Absolute: 0.1 10*3/uL (ref 0.0–0.1)
Basophils Relative: 1 %
Eosinophils Absolute: 0.3 10*3/uL (ref 0.0–0.5)
Eosinophils Relative: 2 %
HCT: 27.7 % — ABNORMAL LOW (ref 39.0–52.0)
Hemoglobin: 8.3 g/dL — ABNORMAL LOW (ref 13.0–17.0)
Immature Granulocytes: 2 %
Lymphocytes Relative: 8 %
Lymphs Abs: 0.9 10*3/uL (ref 0.7–4.0)
MCH: 29.2 pg (ref 26.0–34.0)
MCHC: 30 g/dL (ref 30.0–36.0)
MCV: 97.5 fL (ref 80.0–100.0)
Monocytes Absolute: 0.6 10*3/uL (ref 0.1–1.0)
Monocytes Relative: 5 %
Neutro Abs: 9.6 10*3/uL — ABNORMAL HIGH (ref 1.7–7.7)
Neutrophils Relative %: 82 %
Platelets: 278 10*3/uL (ref 150–400)
RBC: 2.84 MIL/uL — ABNORMAL LOW (ref 4.22–5.81)
RDW: 17.6 % — ABNORMAL HIGH (ref 11.5–15.5)
WBC: 11.6 10*3/uL — ABNORMAL HIGH (ref 4.0–10.5)
nRBC: 0 % (ref 0.0–0.2)

## 2019-03-25 LAB — PROTIME-INR
INR: 1.5 — ABNORMAL HIGH (ref 0.8–1.2)
Prothrombin Time: 18.1 seconds — ABNORMAL HIGH (ref 11.4–15.2)

## 2019-03-25 LAB — HEPARIN LEVEL (UNFRACTIONATED): Heparin Unfractionated: <0.1 IU/mL — ABNORMAL LOW (ref 0.30–0.70)

## 2019-03-25 LAB — APTT: aPTT: 57 seconds — ABNORMAL HIGH (ref 24–36)

## 2019-03-26 LAB — CBC
HCT: 25.2 % — ABNORMAL LOW (ref 39.0–52.0)
Hemoglobin: 7.7 g/dL — ABNORMAL LOW (ref 13.0–17.0)
MCH: 29.8 pg (ref 26.0–34.0)
MCHC: 30.6 g/dL (ref 30.0–36.0)
MCV: 97.7 fL (ref 80.0–100.0)
Platelets: 309 10*3/uL (ref 150–400)
RBC: 2.58 MIL/uL — ABNORMAL LOW (ref 4.22–5.81)
RDW: 18 % — ABNORMAL HIGH (ref 11.5–15.5)
WBC: 12.5 10*3/uL — ABNORMAL HIGH (ref 4.0–10.5)
nRBC: 0 % (ref 0.0–0.2)

## 2019-03-26 LAB — COMPREHENSIVE METABOLIC PANEL
ALT: 9 U/L (ref 0–44)
AST: 36 U/L (ref 15–41)
Albumin: 1.8 g/dL — ABNORMAL LOW (ref 3.5–5.0)
Alkaline Phosphatase: 81 U/L (ref 38–126)
Anion gap: 12 (ref 5–15)
BUN: 12 mg/dL (ref 8–23)
CO2: 27 mmol/L (ref 22–32)
Calcium: 8.5 mg/dL — ABNORMAL LOW (ref 8.9–10.3)
Chloride: 97 mmol/L — ABNORMAL LOW (ref 98–111)
Creatinine, Ser: 4.41 mg/dL — ABNORMAL HIGH (ref 0.61–1.24)
GFR calc Af Amer: 15 mL/min — ABNORMAL LOW (ref >60)
GFR calc non Af Amer: 13 mL/min — ABNORMAL LOW (ref >60)
Glucose, Bld: 108 mg/dL — ABNORMAL HIGH (ref 70–99)
Potassium: 3.7 mmol/L (ref 3.5–5.1)
Sodium: 136 mmol/L (ref 135–145)
Total Bilirubin: 0.6 mg/dL (ref 0.3–1.2)
Total Protein: 5.6 g/dL — ABNORMAL LOW (ref 6.5–8.1)

## 2019-03-26 LAB — MAGNESIUM: Magnesium: 1.8 mg/dL (ref 1.7–2.4)

## 2019-03-26 LAB — HEPATITIS B CORE ANTIBODY, TOTAL: Hep B Core Total Ab: NONREACTIVE

## 2019-03-26 LAB — HEPATITIS B SURFACE ANTIGEN: Hepatitis B Surface Ag: NONREACTIVE

## 2019-03-26 LAB — RENAL FUNCTION PANEL
Albumin: 1.7 g/dL — ABNORMAL LOW (ref 3.5–5.0)
Anion gap: 13 (ref 5–15)
BUN: 17 mg/dL (ref 8–23)
CO2: 27 mmol/L (ref 22–32)
Calcium: 8.6 mg/dL — ABNORMAL LOW (ref 8.9–10.3)
Chloride: 97 mmol/L — ABNORMAL LOW (ref 98–111)
Creatinine, Ser: 4.86 mg/dL — ABNORMAL HIGH (ref 0.61–1.24)
GFR calc Af Amer: 14 mL/min — ABNORMAL LOW (ref >60)
GFR calc non Af Amer: 12 mL/min — ABNORMAL LOW (ref >60)
Glucose, Bld: 110 mg/dL — ABNORMAL HIGH (ref 70–99)
Phosphorus: 4 mg/dL (ref 2.5–4.6)
Potassium: 3.7 mmol/L (ref 3.5–5.1)
Sodium: 137 mmol/L (ref 135–145)

## 2019-03-26 LAB — TSH: TSH: 3.486 u[IU]/mL (ref 0.350–4.500)

## 2019-03-26 LAB — PROTIME-INR
INR: 1.7 — ABNORMAL HIGH (ref 0.8–1.2)
Prothrombin Time: 19.6 seconds — ABNORMAL HIGH (ref 11.4–15.2)

## 2019-03-26 LAB — HEPARIN LEVEL (UNFRACTIONATED)
Heparin Unfractionated: 0.21 IU/mL — ABNORMAL LOW (ref 0.30–0.70)
Heparin Unfractionated: 0.41 IU/mL (ref 0.30–0.70)
Heparin Unfractionated: <0.1 IU/mL — ABNORMAL LOW (ref 0.30–0.70)

## 2019-03-26 LAB — HEMOGLOBIN A1C
Hgb A1c MFr Bld: 5.3 % (ref 4.8–5.6)
Mean Plasma Glucose: 105.41 mg/dL

## 2019-03-26 LAB — VANCOMYCIN, TROUGH: Vancomycin Tr: 22 ug/mL (ref 15–20)

## 2019-03-26 LAB — PHOSPHORUS: Phosphorus: 3.8 mg/dL (ref 2.5–4.6)

## 2019-03-26 MED ORDER — HEPARIN SOD (PORCINE) IN D5W 100 UNIT/ML IV SOLN
14.50 | INTRAVENOUS | Status: DC
Start: ? — End: 2019-03-26

## 2019-03-26 MED ORDER — METOCLOPRAMIDE HCL 10 MG PO TABS
10.00 | ORAL_TABLET | ORAL | Status: DC
Start: 2019-03-25 — End: 2019-03-26

## 2019-03-26 MED ORDER — HEPARIN SOD (PORCINE) IN D5W 100 UNIT/ML IV SOLN
40.00 | INTRAVENOUS | Status: DC
Start: ? — End: 2019-03-26

## 2019-03-26 MED ORDER — ACETAMINOPHEN 325 MG PO TABS
650.00 | ORAL_TABLET | ORAL | Status: DC
Start: ? — End: 2019-03-26

## 2019-03-26 MED ORDER — SEVELAMER CARBONATE 800 MG PO TABS
800.00 | ORAL_TABLET | ORAL | Status: DC
Start: 2019-03-25 — End: 2019-03-26

## 2019-03-26 MED ORDER — CHOLESTYRAMINE LIGHT 4 G PO PACK
4.00 | PACK | ORAL | Status: DC
Start: 2019-03-25 — End: 2019-03-26

## 2019-03-26 MED ORDER — ZINC OXIDE 40 % EX PSTE
PASTE | CUTANEOUS | Status: DC
Start: ? — End: 2019-03-26

## 2019-03-26 MED ORDER — CLONIDINE HCL 0.1 MG PO TABS
0.10 | ORAL_TABLET | ORAL | Status: DC
Start: ? — End: 2019-03-26

## 2019-03-26 MED ORDER — TRAMADOL HCL 50 MG PO TABS
50.00 | ORAL_TABLET | ORAL | Status: DC
Start: ? — End: 2019-03-26

## 2019-03-26 MED ORDER — GENERIC EXTERNAL MEDICATION
1.00 | Status: DC
Start: ? — End: 2019-03-26

## 2019-03-26 MED ORDER — GENERIC EXTERNAL MEDICATION
5.00 | Status: DC
Start: ? — End: 2019-03-26

## 2019-03-26 MED ORDER — CALCITRIOL 0.25 MCG PO CAPS
0.25 | ORAL_CAPSULE | ORAL | Status: DC
Start: 2019-03-26 — End: 2019-03-26

## 2019-03-26 MED ORDER — ALPRAZOLAM 0.5 MG PO TABS
0.50 | ORAL_TABLET | ORAL | Status: DC
Start: ? — End: 2019-03-26

## 2019-03-26 MED ORDER — GUAIFENESIN 100 MG/5ML PO LIQD
400.00 | ORAL | Status: DC
Start: ? — End: 2019-03-26

## 2019-03-26 MED ORDER — EPOETIN ALFA 20000 UNIT/ML IJ SOLN
20000.00 | INTRAMUSCULAR | Status: DC
Start: 2019-03-26 — End: 2019-03-26

## 2019-03-26 MED ORDER — LORAZEPAM 2 MG/ML IJ SOLN
0.25 | INTRAMUSCULAR | Status: DC
Start: ? — End: 2019-03-26

## 2019-03-26 MED ORDER — METOPROLOL SUCCINATE ER 25 MG PO TB24
25.00 | ORAL_TABLET | ORAL | Status: DC
Start: ? — End: 2019-03-26

## 2019-03-26 MED ORDER — SERTRALINE HCL 100 MG PO TABS
100.00 | ORAL_TABLET | ORAL | Status: DC
Start: 2019-03-26 — End: 2019-03-26

## 2019-03-26 MED ORDER — TEMAZEPAM 15 MG PO CAPS
15.00 | ORAL_CAPSULE | ORAL | Status: DC
Start: ? — End: 2019-03-26

## 2019-03-26 MED ORDER — PREGABALIN 25 MG PO CAPS
25.00 | ORAL_CAPSULE | ORAL | Status: DC
Start: 2019-03-25 — End: 2019-03-26

## 2019-03-26 MED ORDER — WARFARIN SODIUM 5 MG PO TABS
10.00 | ORAL_TABLET | ORAL | Status: DC
Start: 2019-03-25 — End: 2019-03-26

## 2019-03-26 MED ORDER — HYDROCODONE-ACETAMINOPHEN 5-325 MG PO TABS
1.00 | ORAL_TABLET | ORAL | Status: DC
Start: ? — End: 2019-03-26

## 2019-03-26 MED ORDER — PANTOPRAZOLE SODIUM 40 MG PO TBEC
40.00 | DELAYED_RELEASE_TABLET | ORAL | Status: DC
Start: 2019-03-26 — End: 2019-03-26

## 2019-03-26 MED ORDER — GENERIC EXTERNAL MEDICATION
1.00 | Status: DC
Start: 2019-03-25 — End: 2019-03-26

## 2019-03-26 MED ORDER — HEPARIN SOD (PORCINE) IN D5W 100 UNIT/ML IV SOLN
72.50 | INTRAVENOUS | Status: DC
Start: ? — End: 2019-03-26

## 2019-03-26 MED ORDER — LOPERAMIDE HCL 2 MG PO CAPS
2.00 | ORAL_CAPSULE | ORAL | Status: DC
Start: 2019-03-25 — End: 2019-03-26

## 2019-03-26 MED ORDER — ONDANSETRON HCL 4 MG/2ML IJ SOLN
4.00 | INTRAMUSCULAR | Status: DC
Start: ? — End: 2019-03-26

## 2019-03-26 MED ORDER — GENERIC EXTERNAL MEDICATION
5.00 | Status: DC
Start: 2019-03-25 — End: 2019-03-26

## 2019-03-26 MED ORDER — CULTURELLE PO CAPS
1.00 | ORAL_CAPSULE | ORAL | Status: DC
Start: 2019-03-25 — End: 2019-03-26

## 2019-03-26 NOTE — Consult Note (Signed)
Consult note       [facility] CONSULT NOTE    Date: 03/26/2019                  Patient Name:  Calvin [name]  MRN: [id number]  DOB: 25-Feb-1956  Age / Sex: 63 y.o., male         PCP: Larene Beach, MD                 Service Requesting Consult:  Hospitalist                 Reason for Consult:  Evaluation management of ESRD            History of Present Illness: Patient is a 63 y.o. male with a PMHx of ESRD previously on peritoneal dialysis now on hemodialysis, anemia of chronic kidney disease, secondary hyperparathyroidism, coarctation of the aorta, atrial fibrillation/flutter, mechanical aortic valve on anticoagulation, hypertension, coronary artery disease status post PCI, congenital bicuspid aortic valve, chronic hepatitis C who was admitted to [facility] on 03/25/2019 for ongoing treatment of ESRD as well as physical therapy for debility.  He was admitted to [facility] from 03/1119 to 03/25/2019.  On a prior admission he developed bacterial peritonitis.  He then went home on hospice but changed his mind and came back to the hospital.  Peritoneal dialysis catheter was removed on 03/20/2019.  Infectious disease recommended treatment with vancomycin and meropenem till December 4.  He was switched to hemodialysis on 03/17/2019.  He appears to be tolerating dialysis treatments well.  Significant ultrafiltration was performed during his hospitalization.  He has a right internal jugular PermCath in place at the moment.   Medications: Outpatient medications: Medications Prior to Admission  Medication Sig Dispense Refill Last Dose  . amoxicillin (AMOXIL) 500 MG capsule Take 500 mg by mouth daily as needed (1 hour prior to dental appts).      Marland Kitchen aspirin EC 81 MG tablet Take 1 tablet (81 mg total) by mouth daily. 90 tablet 3   . B Complex Vitamins (VITAMIN B COMPLEX) TABS Take 1 tablet by mouth daily.     Marland Kitchen glucosamine-chondroitin 500-400 MG tablet  Take 1 tablet by mouth daily.     . Methoxy PEG-Epoetin Beta (MIRCERA IJ) Inject as directed every 21 ( twenty-one) days. Aranesp     . metoprolol succinate (TOPROL-XL) 50 MG 24 hr tablet TAKE 1 TABLET (50 MG TOTAL) BY MOUTH 2 (TWO) TIMES DAILY. TAKE WITH OR IMMEDIATELY FOLLOWING A MEAL. 180 tablet 0   . olmesartan (BENICAR) 20 MG tablet Take 20 mg by mouth daily as needed.     . pantoprazole (PROTONIX) 40 MG tablet Take 40 mg by mouth daily.     . sevelamer carbonate (RENVELA) 800 MG tablet Take 800 mg by mouth 3 (three) times daily with meals.      . warfarin (COUMADIN) 7.5 MG tablet Take 1 tablet (7.5 mg total) by mouth daily at 6 PM. (Patient taking differently: Take 7.5 mg by mouth daily at 6 PM. Taking 1 tablet daily except on Wednesday and Sunday, taking half a tablet.) 90 tablet 3     Current medications: Heparin drip, meropenem 1 g IV daily, Retacrit 10,000 units with each dialysis treatment, calcitriol 0.25 mcg Tuesday, Thursday, Saturday, Calcitrol 0.25 mcg Monday and Friday, cholestyramine 1 packet daily, warfarin 5 mg daily, loperamide 2 mg 4 times daily, Lyrica 25 mg twice daily, sertraline 100 mg daily,  sevelamer 800 mg 3 times daily, vancomycin dosing per pharmacy  Allergies: Allergies  Allergen Reactions  . Ace Inhibitors Swelling, Anxiety and Other (See Comments)    Altace  "Can tolerate Benicar".  Diffuse swelling Edema Diffuse swelling No issues with Benicar Elevated kidney functions Diffuse swelling No issues with Benicar Edema Diffuse swelling No issues with Benicar Edema Elevated kidney functions   . Methoxy Polyethylene Glycol-Epoetin Beta Other (See Comments)    Not effective; low hbg made patient feel awful( lethargic) Not effective; low hbg   . Ramipril Other (See Comments)    Elevated kidney functions Elevated kidney functions  . Tape Rash    Prefers clear take.       Past Medical History: Past Medical History:  Diagnosis Date  . Anemia   .  Anemia of renal disease 10/04/2011  . Aortic aneurysm ([place])   . Atrial fibrillation ([place])   . Atrial flutter ([place])   . AVD (aortic valve disease)   . Bilateral lower extremity edema 07/03/2012  . CAD (coronary artery disease)   . Chronic anticoagulation 07/10/2012  . CKD (chronic kidney disease)    baseline creatinine is 1.5-2  . CKD (chronic kidney disease) stage 3, GFR 30-59 ml/min (HCC) 07/02/2012  . Coarctation of aorta (previous repair and stent) 04/05/2011  . Colonic ulcer 2007   felt to be secondary to NSAID  . Complex coarctation of the aorta   . Gastric polyp 2007, 06/2012   benign.  . Gout   . Gout 07/03/2012  . Hepatitis C   . History of atrial flutter s/p DCCV 04/05/2011  . HTN (hypertension)   . Hyperlipidemia   . Hypertension 04/05/2011  . Long term (current) use of anticoagulants 12/12/2013  . Obesity   . PONV (postoperative nausea and vomiting)   . Renal insufficiency   . S/P aortic valve replacement 04/05/2011  . 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 Testicular adenoma   . Thoracic aortic aneurysm ([place]) 04/05/2011     Past Surgical History: Past Surgical History:  Procedure Laterality Date  . AORTIC VALVE REPLACEMENT  1995   [facility]  . AORTIC VALVE REPLACEMENT AND MITRAL VALVE REPAIR  1974   Bicuspid aortic valve  . CARDIOVERSION  02/27/2012   Procedure: CARDIOVERSION;  Surgeon: Lelon Perla, MD;  Location: [facility] ENDOSCOPY;  Service: Cardiovascular;  Laterality: N/A;  . CARDIOVERSION N/A 05/29/2013   Procedure: CARDIOVERSION;  Surgeon: Lelon Perla, MD;  Location: [facility] ENDOSCOPY;  Service: Cardiovascular;  Laterality: N/A;  . CARDIOVERSION N/A 12/15/2013   Procedure: CARDIOVERSION;  Surgeon: Sueanne Margarita, MD;  Location: [facility] ENDOSCOPY;  Service: Cardiovascular;  Laterality: N/A;  . CARDIOVERSION N/A 08/25/2014   Procedure: CARDIOVERSION;  Surgeon: Lelon Perla, MD;  Location: [facility] ENDOSCOPY;  Service: Cardiovascular;  Laterality: N/A;  .  CARDIOVERSION N/A 02/17/2015   Procedure: CARDIOVERSION;  Surgeon: Larey Dresser, MD;  Location: [place];  Service: Cardiovascular;  Laterality: N/A;  . [place]  . COARCTATION OF AORTA REPAIR  1970  . ESOPHAGOGASTRODUODENOSCOPY N/A 07/04/2012   Procedure: ESOPHAGOGASTRODUODENOSCOPY (EGD);  Surgeon: Inda Castle, MD;  Location: [place];  Service: Endoscopy;  Laterality: N/A;  . ORCHIECTOMY  1992   testicular cancer  . TONSILLECTOMY       Family History: Family History  Problem Relation Age of Onset  . Heart disease Unknown        No family history  .  Heart attack Neg Hx      Social History: Social History   Socioeconomic History  . Marital status: Married    Spouse name: Not on file  . Number of children: 4  . Years of education: Not on file  . Highest education level: Not on file  Occupational History  . Not on file  Social Needs  . Financial resource strain: Not on file  . Food insecurity    Worry: Not on file    Inability: Not on file  . Transportation needs    Medical: Not on file    Non-medical: Not on file  Tobacco Use  . Smoking status: Former Smoker    Packs/day: 0.25    Years: 7.00    Pack years: 1.75    Types: Cigarettes    Start date: 05/28/1975    Quit date: 05/27/1981    Years since quitting: 37.8  . Smokeless tobacco: Never Used  . Tobacco comment: quit 25 years ago  Substance and Sexual Activity  . Alcohol use: Yes    Alcohol/week: 0.0 standard drinks    Comment: wine, occasionally  . Drug use: No  . Sexual activity: Not on file  Lifestyle  . Physical activity    Days per week: Not on file    Minutes per session: Not on file  . Stress: Not on file  Relationships  . Social Herbalist on phone: Not on file    Gets together: Not on file    Attends religious service: Not on file    Active member of club or organization: Not on file    Attends meetings of clubs or organizations: Not on file     Relationship status: Not on file  . Intimate partner violence    Fear of current or ex partner: Not on file    Emotionally abused: Not on file    Physically abused: Not on file    Forced sexual activity: Not on file  Other Topics Concern  . Not on file  Social History Narrative   Pt lives in [place] with wife.  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     Review of Systems: Review of Systems  Constitutional: Positive for malaise/fatigue. Negative for chills and fever.  HENT: Negative for congestion, hearing loss and tinnitus.   Eyes: Negative for blurred vision and double vision.  Respiratory: Negative for cough, sputum production and shortness of breath.   Cardiovascular: Positive for leg swelling. Negative for chest pain and palpitations.  Gastrointestinal: Positive for diarrhea. Negative for heartburn, nausea and vomiting.  Genitourinary: Negative for dysuria, frequency and urgency.  Musculoskeletal: Positive for back pain. Negative for neck pain.  Skin: Negative for itching and rash.  Neurological: Positive for weakness. Negative for dizziness.  Endo/Heme/Allergies: Negative for polydipsia. Does not bruise/bleed easily.  Psychiatric/Behavioral: Negative for memory loss. The patient is not nervous/anxious.      Vital Signs: There were no vitals taken for this visit.  Weight trends: There were no vitals filed for this visit.  Physical Exam: General: NAD,   Head: Normocephalic, atraumatic.  Eyes: Anicteric, EOMI  Nose: Mucous membranes moist, not inflammed, nonerythematous.  Throat: Oropharynx nonerythematous, no exudate appreciated.   Neck: Supple, trachea midline.  Lungs:  Normal respiratory effort. Clear to auscultation BL without crackles or wheezes.  Heart: RRR. S1 and S2 normal without gallop, murmur, or rubs.  Abdomen:  BS normoactive. Soft, Nondistended, non-tender.  No masses or organomegaly.  Extremities: No pretibial edema.   Neurologic: A&O X3, Motor strength is 5/5 in the all 4 extremities  Skin: No visible rashes, scars.    Lab results: Basic Metabolic Panel: Recent Labs  Lab 03/26/19 0328  NA 136  K 3.7  CL 97*  CO2 27  GLUCOSE 108*  BUN 12  CREATININE 4.41*  CALCIUM 8.5*  MG 1.8  PHOS 3.8    Liver Function Tests: Recent Labs  Lab 03/26/19 0328  AST 36  ALT 9  ALKPHOS 81  BILITOT 0.6  PROT 5.6*  ALBUMIN 1.8*   No results for input(s): LIPASE, AMYLASE in the last 168 hours. No results for input(s): AMMONIA in the last 168 hours.  CBC: Recent Labs  Lab 03/25/19 1656  WBC 11.6*  NEUTROABS 9.6*  HGB 8.3*  HCT 27.7*  MCV 97.5  PLT 278    Cardiac Enzymes: No results for input(s): CKTOTAL, CKMB, CKMBINDEX, TROPONINI in the last 168 hours.  BNP: Invalid input(s): POCBNP  CBG: No results for input(s): GLUCAP in the last 168 hours.  Microbiology: Results for orders placed or performed during the hospital encounter of 07/01/12  MRSA PCR Screening     Status: None   Collection Time: 07/01/12  6:34 PM   Specimen: Nasal Mucosa; Nasopharyngeal  Result Value Ref Range Status   MRSA by PCR NEGATIVE NEGATIVE Final    Comment:        The GeneXpert MRSA Assay (FDA approved for NASAL specimens only), is one component of a comprehensive MRSA colonization surveillance program. It is not intended to diagnose MRSA infection nor to guide or monitor treatment for MRSA infections.    Coagulation Studies: Recent Labs    03/25/19 1656 03/26/19 0328  LABPROT 18.1* 19.6*  INR 1.5* 1.7*    Urinalysis: No results for input(s): COLORURINE, LABSPEC, PHURINE, GLUCOSEU, HGBUR, BILIRUBINUR, KETONESUR, PROTEINUR, UROBILINOGEN, NITRITE, LEUKOCYTESUR in the last 72 hours.  Invalid input(s): APPERANCEUR    Imaging: Dg Abd 1 View  Result Date: 03/25/2019 CLINICAL DATA:  63 year old male with abdominal pain and distention. History of ileus. EXAM: ABDOMEN - 1 VIEW COMPARISON:   Abdominal radiograph dated 02/25/2019 and CT dated 10/31/2016. FINDINGS: There is mild air distention of the stomach. There is air in the visualized colon. No dilatation of the small bowel. Diffuse calcification of the mesenteric vasculature. Degenerative changes of the spine. No acute osseous pathology. IMPRESSION: No small bowel dilatation. Electronically Signed   By: Anner Crete M.D.   On: 03/25/2019 17:15   Dg Chest Port 1 View  Result Date: 03/25/2019 CLINICAL DATA:  Shortness of breath. EXAM: PORTABLE CHEST 1 VIEW COMPARISON:  CT 10/16/2018.  Chest x-ray 02/23/2015. FINDINGS: Right central line noted with tip in unchanged position. Prior CABG. Prior CABG. Stable cardiomegaly right base atelectasis/infiltrate. IMPRESSION: 1.  Right central line noted with tip in unchanged position. 2. Prior CABG. Prior CABG. Stable cardiomegaly. No pulmonary venous congestion. 3. Right base atelectasis and infiltrate. Pneumonia cannot be excluded. Electronically Signed   By: Marcello Moores  Register   On: 03/25/2019 17:17      Assessment & Plan: Pt is a 63 y.o. male with a PMHx of ESRD previously on peritoneal dialysis now on hemodialysis, anemia of chronic kidney disease, secondary hyperparathyroidism, coarctation of the aorta, atrial fibrillation/flutter, mechanical aortic valve on anticoagulation, hypertension, coronary artery disease status post PCI, congenital bicuspid aortic valve, chronic hepatitis C who was admitted to [facility] on 03/25/2019 for ongoing treatment of ESRD as  well as physical therapy for debility.   1.  ESRD on HD, previously on peritoneal dialysis but taken off secondary to bacterial peritonitis.  Patient's PD catheter was previously removed.  He is to receive vancomycin and meropenem till 04/04/2019.  He did have hemodialysis treatment yesterday.  However given the Thanksgiving holiday we will plan for dialysis treatment today x2 hours.  Thereafter we will plan for dialysis again on Saturday.  We  will use his right internal jugular PermCath.  2.  Anemia chronic kidney disease.  Maintain the patient on Retacrit.  3.  Secondary hyperparathyroidism.  Maintain the patient on Renvela 800 mg p.o. 3 times daily and continue to monitor bone mineral metabolism parameters.  4.  Atrial fibrillation.  Patient currently on heparin drip.  5.  Thanks for consultation.

## 2019-03-27 LAB — HEPARIN LEVEL (UNFRACTIONATED)
Heparin Unfractionated: 0.47 IU/mL (ref 0.30–0.70)
Heparin Unfractionated: 0.2 IU/mL — ABNORMAL LOW (ref 0.30–0.70)

## 2019-03-27 LAB — PROTIME-INR
INR: 1.9 — ABNORMAL HIGH (ref 0.8–1.2)
Prothrombin Time: 21.7 seconds — ABNORMAL HIGH (ref 11.4–15.2)

## 2019-03-27 LAB — HEPATITIS B SURFACE ANTIBODY, QUANTITATIVE: Hep B S AB Quant (Post): 16 m[IU]/mL (ref >9.9)

## 2019-03-27 LAB — PTH, INTACT AND CALCIUM
Calcium, Total (PTH): 8.4 mg/dL — ABNORMAL LOW (ref 8.6–10.2)
PTH: 59 pg/mL (ref 15–65)

## 2019-03-28 LAB — CBC
HCT: 26.9 % — ABNORMAL LOW (ref 39.0–52.0)
Hemoglobin: 7.9 g/dL — ABNORMAL LOW (ref 13.0–17.0)
MCH: 29 pg (ref 26.0–34.0)
MCHC: 29.4 g/dL — ABNORMAL LOW (ref 30.0–36.0)
MCV: 98.9 fL (ref 80.0–100.0)
Platelets: 401 10*3/uL — ABNORMAL HIGH (ref 150–400)
RBC: 2.72 MIL/uL — ABNORMAL LOW (ref 4.22–5.81)
RDW: 18.1 % — ABNORMAL HIGH (ref 11.5–15.5)
WBC: 10.7 10*3/uL — ABNORMAL HIGH (ref 4.0–10.5)
nRBC: 0.4 % — ABNORMAL HIGH (ref 0.0–0.2)

## 2019-03-28 LAB — RENAL FUNCTION PANEL
Albumin: 1.7 g/dL — ABNORMAL LOW (ref 3.5–5.0)
Anion gap: 12 (ref 5–15)
BUN: 27 mg/dL — ABNORMAL HIGH (ref 8–23)
CO2: 25 mmol/L (ref 22–32)
Calcium: 8.8 mg/dL — ABNORMAL LOW (ref 8.9–10.3)
Chloride: 100 mmol/L (ref 98–111)
Creatinine, Ser: 6.63 mg/dL — ABNORMAL HIGH (ref 0.61–1.24)
GFR calc Af Amer: 9 mL/min — ABNORMAL LOW (ref >60)
GFR calc non Af Amer: 8 mL/min — ABNORMAL LOW (ref >60)
Glucose, Bld: 100 mg/dL — ABNORMAL HIGH (ref 70–99)
Phosphorus: 3.8 mg/dL (ref 2.5–4.6)
Potassium: 4 mmol/L (ref 3.5–5.1)
Sodium: 137 mmol/L (ref 135–145)

## 2019-03-28 LAB — PROTIME-INR
INR: 2.2 — ABNORMAL HIGH (ref 0.8–1.2)
Prothrombin Time: 24.6 seconds — ABNORMAL HIGH (ref 11.4–15.2)

## 2019-03-28 LAB — VANCOMYCIN, TROUGH: Vancomycin Tr: 16 ug/mL (ref 15–20)

## 2019-03-28 NOTE — Progress Notes (Signed)
[facility]  ROUNDING NOTE   Subjective:  Patient being switched to MWF dialysis treatments. Resting in bed comfortably at the moment. No acute complaints at the moment.  Objective:  Vital signs in last 24 hours:  Temperature 96.9 pulse 97 respirations 25 blood pressure 91/52  Physical Exam: General: No acute distress  Head: Normocephalic, atraumatic. Moist oral mucosal membranes  Eyes: Anicteric  Neck: Supple, trachea midline  Lungs:  Clear to auscultation, normal effort  Heart: S1S2 no rubs  Abdomen:  Soft, nontender, bowel sounds present  Extremities: Trace peripheral edema.  Neurologic: Awake, alert, following commands  Skin: No lesions  Access: R IJ PC    Basic Metabolic Panel: Recent Labs  Lab 03/26/19 0328 03/26/19 1031  NA 136 137  K 3.7 3.7  CL 97* 97*  CO2 27 27  GLUCOSE 108* 110*  BUN 12 17  CREATININE 4.41* 4.86*  CALCIUM 8.5* 8.6*  8.4*  MG 1.8  --   PHOS 3.8 4.0    Liver Function Tests: Recent Labs  Lab 03/26/19 0328 03/26/19 1031  AST 36  --   ALT 9  --   ALKPHOS 81  --   BILITOT 0.6  --   PROT 5.6*  --   ALBUMIN 1.8* 1.7*   No results for input(s): LIPASE, AMYLASE in the last 168 hours. No results for input(s): AMMONIA in the last 168 hours.  CBC: Recent Labs  Lab 03/25/19 1656 03/26/19 1031  WBC 11.6* 12.5*  NEUTROABS 9.6*  --   HGB 8.3* 7.7*  HCT 27.7* 25.2*  MCV 97.5 97.7  PLT 278 309    Cardiac Enzymes: No results for input(s): CKTOTAL, CKMB, CKMBINDEX, TROPONINI in the last 168 hours.  BNP: Invalid input(s): POCBNP  CBG: No results for input(s): GLUCAP in the last 168 hours.  Microbiology: Results for orders placed or performed during the hospital encounter of 07/01/12  MRSA PCR Screening     Status: None   Collection Time: 07/01/12  6:34 PM   Specimen: Nasal Mucosa; Nasopharyngeal  Result Value Ref Range Status   MRSA by PCR NEGATIVE NEGATIVE Final    Comment:        The GeneXpert MRSA Assay  (FDA approved for NASAL specimens only), is one component of a comprehensive MRSA colonization surveillance program. It is not intended to diagnose MRSA infection nor to guide or monitor treatment for MRSA infections.    Coagulation Studies: Recent Labs    03/25/19 1656 03/26/19 0328 03/27/19 0351 03/28/19 0502  LABPROT 18.1* 19.6* 21.7* 24.6*  INR 1.5* 1.7* 1.9* 2.2*    Urinalysis: No results for input(s): COLORURINE, LABSPEC, PHURINE, GLUCOSEU, HGBUR, BILIRUBINUR, KETONESUR, PROTEINUR, UROBILINOGEN, NITRITE, LEUKOCYTESUR in the last 72 hours.  Invalid input(s): APPERANCEUR    Imaging: No results found.   Medications:       Assessment/ Plan:  63 y.o. male with a PMHx of ESRD previously on peritoneal dialysis now on hemodialysis, anemia of chronic kidney disease, secondary hyperparathyroidism, coarctation of the aorta, atrial fibrillation/flutter, mechanical aortic valve on anticoagulation, hypertension, coronary artery disease status post PCI, congenital bicuspid aortic valve, chronic hepatitis C who was admitted to [facility] on 03/25/2019 for ongoing treatment of ESRD as well as physical therapy for debility.   1.  ESRD on HD, previously on peritoneal dialysis but taken off secondary to bacterial peritonitis.    We will be switching the patient to MWF dialysis schedule.  His peritonitis will continue to be treated with vancomycin and meropenem  till 04/04/2019.  2.  Anemia chronic kidney disease.  Hemoglobin currently 7.7.  Maintain the patient on Retacrit.  3.  Secondary hyperparathyroidism.  Phosphorus 4.0.  Maintain the patient on Renvela.  Continue to monitor bone mineral metabolism parameters.  4.  Hypotension.  Start the patient on midodrine 10 mg p.o. 3 times daily.    LOS: 0 Calvin [name] 11/27/20208:25 AM

## 2019-03-29 LAB — PROTIME-INR
INR: 2.4 — ABNORMAL HIGH (ref 0.8–1.2)
Prothrombin Time: 25.8 seconds — ABNORMAL HIGH (ref 11.4–15.2)

## 2019-03-30 LAB — PROTIME-INR
INR: 2.4 — ABNORMAL HIGH (ref 0.8–1.2)
Prothrombin Time: 26.4 seconds — ABNORMAL HIGH (ref 11.4–15.2)

## 2019-03-31 LAB — RENAL FUNCTION PANEL
Albumin: 1.7 g/dL — ABNORMAL LOW (ref 3.5–5.0)
Anion gap: 15 (ref 5–15)
BUN: 41 mg/dL — ABNORMAL HIGH (ref 8–23)
CO2: 27 mmol/L (ref 22–32)
Calcium: 8.9 mg/dL (ref 8.9–10.3)
Chloride: 95 mmol/L — ABNORMAL LOW (ref 98–111)
Creatinine, Ser: 7.24 mg/dL — ABNORMAL HIGH (ref 0.61–1.24)
GFR calc Af Amer: 8 mL/min — ABNORMAL LOW (ref >60)
GFR calc non Af Amer: 7 mL/min — ABNORMAL LOW (ref >60)
Glucose, Bld: 95 mg/dL (ref 70–99)
Phosphorus: 4.8 mg/dL — ABNORMAL HIGH (ref 2.5–4.6)
Potassium: 4.3 mmol/L (ref 3.5–5.1)
Sodium: 137 mmol/L (ref 135–145)

## 2019-03-31 LAB — CBC
HCT: 29.2 % — ABNORMAL LOW (ref 39.0–52.0)
Hemoglobin: 8.6 g/dL — ABNORMAL LOW (ref 13.0–17.0)
MCH: 29.1 pg (ref 26.0–34.0)
MCHC: 29.5 g/dL — ABNORMAL LOW (ref 30.0–36.0)
MCV: 98.6 fL (ref 80.0–100.0)
Platelets: 449 10*3/uL — ABNORMAL HIGH (ref 150–400)
RBC: 2.96 MIL/uL — ABNORMAL LOW (ref 4.22–5.81)
RDW: 18.3 % — ABNORMAL HIGH (ref 11.5–15.5)
WBC: 9.5 10*3/uL (ref 4.0–10.5)
nRBC: 0.2 % (ref 0.0–0.2)

## 2019-03-31 LAB — PROTIME-INR
INR: 2.6 — ABNORMAL HIGH (ref 0.8–1.2)
Prothrombin Time: 27.5 seconds — ABNORMAL HIGH (ref 11.4–15.2)

## 2019-03-31 LAB — VANCOMYCIN, TROUGH: Vancomycin Tr: 17 ug/mL (ref 15–20)

## 2019-03-31 NOTE — Progress Notes (Signed)
[facility]  ROUNDING NOTE   Subjective:  Patient due for hemodialysis today. Dialysis staff request patient be switched back to TTS.  Objective:  Vital signs in last 24 hours:  Temperature 98.5 pulse 101 respirations 20 blood pressure 145/103  Physical Exam: General: No acute distress  Head: Normocephalic, atraumatic. Moist oral mucosal membranes  Eyes: Anicteric  Neck: Supple, trachea midline  Lungs:  Clear to auscultation, normal effort  Heart: S1S2 no rubs  Abdomen:  Soft, nontender, bowel sounds present  Extremities: Trace peripheral edema.  Neurologic: Awake, alert, following commands  Skin: No lesions  Access: R IJ PC    Basic Metabolic Panel: Recent Labs  Lab 03/26/19 0328 03/26/19 1031 03/28/19 1733 03/31/19 0629  NA 136 137 137 137  K 3.7 3.7 4.0 4.3  CL 97* 97* 100 95*  CO2 27 27 25 27   GLUCOSE 108* 110* 100* 95  BUN 12 17 27* 41*  CREATININE 4.41* 4.86* 6.63* 7.24*  CALCIUM 8.5* 8.6*  8.4* 8.8* 8.9  MG 1.8  --   --   --   PHOS 3.8 4.0 3.8 4.8*    Liver Function Tests: Recent Labs  Lab 03/26/19 0328 03/26/19 1031 03/28/19 1733 03/31/19 0629  AST 36  --   --   --   ALT 9  --   --   --   ALKPHOS 81  --   --   --   BILITOT 0.6  --   --   --   PROT 5.6*  --   --   --   ALBUMIN 1.8* 1.7* 1.7* 1.7*   No results for input(s): LIPASE, AMYLASE in the last 168 hours. No results for input(s): AMMONIA in the last 168 hours.  CBC: Recent Labs  Lab 03/25/19 1656 03/26/19 1031 03/28/19 1732 03/31/19 0629  WBC 11.6* 12.5* 10.7* 9.5  NEUTROABS 9.6*  --   --   --   HGB 8.3* 7.7* 7.9* 8.6*  HCT 27.7* 25.2* 26.9* 29.2*  MCV 97.5 97.7 98.9 98.6  PLT 278 309 401* 449*    Cardiac Enzymes: No results for input(s): CKTOTAL, CKMB, CKMBINDEX, TROPONINI in the last 168 hours.  BNP: Invalid input(s): POCBNP  CBG: No results for input(s): GLUCAP in the last 168 hours.  Microbiology: Results for orders placed or performed during the  hospital encounter of 07/01/12  MRSA PCR Screening     Status: None   Collection Time: 07/01/12  6:34 PM   Specimen: Nasal Mucosa; Nasopharyngeal  Result Value Ref Range Status   MRSA by PCR NEGATIVE NEGATIVE Final    Comment:        The GeneXpert MRSA Assay (FDA approved for NASAL specimens only), is one component of a comprehensive MRSA colonization surveillance program. It is not intended to diagnose MRSA infection nor to guide or monitor treatment for MRSA infections.    Coagulation Studies: Recent Labs    03/29/19 0534 03/30/19 0601 03/31/19 0629  LABPROT 25.8* 26.4* 27.5*  INR 2.4* 2.4* 2.6*    Urinalysis: No results for input(s): COLORURINE, LABSPEC, PHURINE, GLUCOSEU, HGBUR, BILIRUBINUR, KETONESUR, PROTEINUR, UROBILINOGEN, NITRITE, LEUKOCYTESUR in the last 72 hours.  Invalid input(s): APPERANCEUR    Imaging: No results found.   Medications:       Assessment/ Plan:  63 y.o. male with a PMHx of ESRD previously on peritoneal dialysis now on hemodialysis, anemia of chronic kidney disease, secondary hyperparathyroidism, coarctation of the aorta, atrial fibrillation/flutter, mechanical aortic valve on anticoagulation, hypertension, coronary  artery disease status post PCI, congenital bicuspid aortic valve, chronic hepatitis C who was admitted to [facility] on 03/25/2019 for ongoing treatment of ESRD as well as physical therapy for debility.   1.  ESRD on HD, previously on peritoneal dialysis but taken off secondary to bacterial peritonitis.     -Patient due for dialysis today however dialysis staff request that we switch him back to TTS schedule.  We will plan for short treatment tomorrow as well.  2.  Anemia chronic kidney disease.  Hemoglobin up to 8.6.  Maintain the patient on Retacrit.  3.  Secondary hyperparathyroidism.  Phosphorus currently 4.8 and acceptable.  Continue Renvela.  4.  Hypotension.  Maintain the patient on midodrine and continue periodic use of  albumin.  5.  Protein calorie malnutrition.  Patient does have liquiacell that he is taking.    LOS: 0 Calvin [name] 11/30/20208:26 AM

## 2019-04-01 LAB — PROTIME-INR
INR: 2.4 — ABNORMAL HIGH (ref 0.8–1.2)
Prothrombin Time: 26.4 seconds — ABNORMAL HIGH (ref 11.4–15.2)

## 2019-04-02 LAB — CBC
HCT: 29.1 % — ABNORMAL LOW (ref 39.0–52.0)
Hemoglobin: 8.6 g/dL — ABNORMAL LOW (ref 13.0–17.0)
MCH: 29 pg (ref 26.0–34.0)
MCHC: 29.6 g/dL — ABNORMAL LOW (ref 30.0–36.0)
MCV: 98 fL (ref 80.0–100.0)
Platelets: 431 10*3/uL — ABNORMAL HIGH (ref 150–400)
RBC: 2.97 MIL/uL — ABNORMAL LOW (ref 4.22–5.81)
RDW: 18 % — ABNORMAL HIGH (ref 11.5–15.5)
WBC: 8.3 10*3/uL (ref 4.0–10.5)
nRBC: 0 % (ref 0.0–0.2)

## 2019-04-02 LAB — RENAL FUNCTION PANEL
Albumin: 1.7 g/dL — ABNORMAL LOW (ref 3.5–5.0)
Anion gap: 13 (ref 5–15)
BUN: 25 mg/dL — ABNORMAL HIGH (ref 8–23)
CO2: 27 mmol/L (ref 22–32)
Calcium: 8.8 mg/dL — ABNORMAL LOW (ref 8.9–10.3)
Chloride: 98 mmol/L (ref 98–111)
Creatinine, Ser: 4.25 mg/dL — ABNORMAL HIGH (ref 0.61–1.24)
GFR calc Af Amer: 16 mL/min — ABNORMAL LOW (ref >60)
GFR calc non Af Amer: 14 mL/min — ABNORMAL LOW (ref >60)
Glucose, Bld: 94 mg/dL (ref 70–99)
Phosphorus: 3.5 mg/dL (ref 2.5–4.6)
Potassium: 3.7 mmol/L (ref 3.5–5.1)
Sodium: 138 mmol/L (ref 135–145)

## 2019-04-02 LAB — VANCOMYCIN, TROUGH: Vancomycin Tr: 16 ug/mL (ref 15–20)

## 2019-04-02 LAB — PROTIME-INR
INR: 2.9 — ABNORMAL HIGH (ref 0.8–1.2)
Prothrombin Time: 30 seconds — ABNORMAL HIGH (ref 11.4–15.2)

## 2019-04-02 NOTE — Progress Notes (Signed)
[facility]  ROUNDING NOTE   Subjective:  Patient resting comfortably in bed at the moment. Due for dialysis again tomorrow.  Objective:  Vital signs in last 24 hours:  Temperature 97.4 pulse 90 respirations 26 blood pressure 104/31  Physical Exam: General: No acute distress  Head: Normocephalic, atraumatic. Moist oral mucosal membranes  Eyes: Anicteric  Neck: Supple, trachea midline  Lungs:  Clear to auscultation, normal effort  Heart: S1S2 no rubs  Abdomen:  Soft, nontender, bowel sounds present  Extremities: Trace peripheral edema.  Neurologic: Resting comfortably  Skin: No lesions  Access: R IJ PC    Basic Metabolic Panel: Recent Labs  Lab 03/28/19 1733 03/31/19 0629 04/02/19 0544  NA 137 137 138  K 4.0 4.3 3.7  CL 100 95* 98  CO2 25 27 27   GLUCOSE 100* 95 94  BUN 27* 41* 25*  CREATININE 6.63* 7.24* 4.25*  CALCIUM 8.8* 8.9 8.8*  PHOS 3.8 4.8* 3.5    Liver Function Tests: Recent Labs  Lab 03/28/19 1733 03/31/19 0629 04/02/19 0544  ALBUMIN 1.7* 1.7* 1.7*   No results for input(s): LIPASE, AMYLASE in the last 168 hours. No results for input(s): AMMONIA in the last 168 hours.  CBC: Recent Labs  Lab 03/28/19 1732 03/31/19 0629 04/02/19 0544  WBC 10.7* 9.5 8.3  HGB 7.9* 8.6* 8.6*  HCT 26.9* 29.2* 29.1*  MCV 98.9 98.6 98.0  PLT 401* 449* 431*    Cardiac Enzymes: No results for input(s): CKTOTAL, CKMB, CKMBINDEX, TROPONINI in the last 168 hours.  BNP: Invalid input(s): POCBNP  CBG: No results for input(s): GLUCAP in the last 168 hours.  Microbiology: Results for orders placed or performed during the hospital encounter of 07/01/12  MRSA PCR Screening     Status: None   Collection Time: 07/01/12  6:34 PM   Specimen: Nasal Mucosa; Nasopharyngeal  Result Value Ref Range Status   MRSA by PCR NEGATIVE NEGATIVE Final    Comment:        The GeneXpert MRSA Assay (FDA approved for NASAL specimens only), is one component of  a comprehensive MRSA colonization surveillance program. It is not intended to diagnose MRSA infection nor to guide or monitor treatment for MRSA infections.    Coagulation Studies: Recent Labs    03/31/19 0629 04/01/19 0617 04/02/19 0544  LABPROT 27.5* 26.4* 30.0*  INR 2.6* 2.4* 2.9*    Urinalysis: No results for input(s): COLORURINE, LABSPEC, PHURINE, GLUCOSEU, HGBUR, BILIRUBINUR, KETONESUR, PROTEINUR, UROBILINOGEN, NITRITE, LEUKOCYTESUR in the last 72 hours.  Invalid input(s): APPERANCEUR    Imaging: No results found.   Medications:       Assessment/ Plan:  63 y.o. male with a PMHx of ESRD previously on peritoneal dialysis now on hemodialysis, anemia of chronic kidney disease, secondary hyperparathyroidism, coarctation of the aorta, atrial fibrillation/flutter, mechanical aortic valve on anticoagulation, hypertension, coronary artery disease status post PCI, congenital bicuspid aortic valve, chronic hepatitis C who was admitted to [facility] on 03/25/2019 for ongoing treatment of ESRD as well as physical therapy for debility.   1.  ESRD on HD, previously on peritoneal dialysis but taken off secondary to bacterial peritonitis.     -Patient due for hemodialysis again tomorrow.  Orders to be prepared.  2.  Anemia chronic kidney disease.  Maintain the patient on Retacrit.  3.  Secondary hyperparathyroidism.  Phosphorus 3.5 and acceptable.  Continue to monitor.  4.  Hypotension.  Maintain the patient on midodrine and continue periodic use of albumin.  5.  Protein calorie malnutrition.  Continue protein supplementation.    LOS: 0 Calvin [name] 12/2/202010:44 AM

## 2019-04-03 LAB — CBC
HCT: 28.4 % — ABNORMAL LOW (ref 39.0–52.0)
Hemoglobin: 8.5 g/dL — ABNORMAL LOW (ref 13.0–17.0)
MCH: 29.3 pg (ref 26.0–34.0)
MCHC: 29.9 g/dL — ABNORMAL LOW (ref 30.0–36.0)
MCV: 97.9 fL (ref 80.0–100.0)
Platelets: 447 10*3/uL — ABNORMAL HIGH (ref 150–400)
RBC: 2.9 MIL/uL — ABNORMAL LOW (ref 4.22–5.81)
RDW: 17.8 % — ABNORMAL HIGH (ref 11.5–15.5)
WBC: 9.4 10*3/uL (ref 4.0–10.5)
nRBC: 0 % (ref 0.0–0.2)

## 2019-04-03 LAB — RENAL FUNCTION PANEL
Albumin: 1.7 g/dL — ABNORMAL LOW (ref 3.5–5.0)
Anion gap: 13 (ref 5–15)
BUN: 42 mg/dL — ABNORMAL HIGH (ref 8–23)
CO2: 27 mmol/L (ref 22–32)
Calcium: 8.6 mg/dL — ABNORMAL LOW (ref 8.9–10.3)
Chloride: 97 mmol/L — ABNORMAL LOW (ref 98–111)
Creatinine, Ser: 5.88 mg/dL — ABNORMAL HIGH (ref 0.61–1.24)
GFR calc Af Amer: 11 mL/min — ABNORMAL LOW (ref >60)
GFR calc non Af Amer: 9 mL/min — ABNORMAL LOW (ref >60)
Glucose, Bld: 99 mg/dL (ref 70–99)
Phosphorus: 4.3 mg/dL (ref 2.5–4.6)
Potassium: 3.8 mmol/L (ref 3.5–5.1)
Sodium: 137 mmol/L (ref 135–145)

## 2019-04-03 LAB — PROTIME-INR
INR: 3 — ABNORMAL HIGH (ref 0.8–1.2)
Prothrombin Time: 30.8 seconds — ABNORMAL HIGH (ref 11.4–15.2)

## 2019-04-04 LAB — PROTIME-INR
INR: 2.3 — ABNORMAL HIGH (ref 0.8–1.2)
Prothrombin Time: 25.3 seconds — ABNORMAL HIGH (ref 11.4–15.2)

## 2019-04-04 NOTE — Progress Notes (Signed)
[facility]  ROUNDING NOTE   Subjective:  Patient signed off of dialysis early yesterday due to gluteal pain. He will be due for dialysis again tomorrow.  Objective:  Vital signs in last 24 hours:  Temperature 98 pulse 97 respirations 20 blood pressure 90/46  Physical Exam: General: No acute distress  Head: Normocephalic, atraumatic. Moist oral mucosal membranes  Eyes: Anicteric  Neck: Supple, trachea midline  Lungs:  Clear to auscultation, normal effort  Heart: G3O7 midsystolic click no rubs  Abdomen:  Soft, nontender, bowel sounds present  Extremities: Trace peripheral edema.  Neurologic: Resting comfortably  Skin: No lesions  Access: R IJ PC    Basic Metabolic Panel: Recent Labs  Lab 03/28/19 1733 03/31/19 0629 04/02/19 0544 04/03/19 0626  NA 137 137 138 137  K 4.0 4.3 3.7 3.8  CL 100 95* 98 97*  CO2 25 27 27 27   GLUCOSE 100* 95 94 99  BUN 27* 41* 25* 42*  CREATININE 6.63* 7.24* 4.25* 5.88*  CALCIUM 8.8* 8.9 8.8* 8.6*  PHOS 3.8 4.8* 3.5 4.3    Liver Function Tests: Recent Labs  Lab 03/28/19 1733 03/31/19 0629 04/02/19 0544 04/03/19 0626  ALBUMIN 1.7* 1.7* 1.7* 1.7*   No results for input(s): LIPASE, AMYLASE in the last 168 hours. No results for input(s): AMMONIA in the last 168 hours.  CBC: Recent Labs  Lab 03/28/19 1732 03/31/19 0629 04/02/19 0544 04/03/19 0626  WBC 10.7* 9.5 8.3 9.4  HGB 7.9* 8.6* 8.6* 8.5*  HCT 26.9* 29.2* 29.1* 28.4*  MCV 98.9 98.6 98.0 97.9  PLT 401* 449* 431* 447*    Cardiac Enzymes: No results for input(s): CKTOTAL, CKMB, CKMBINDEX, TROPONINI in the last 168 hours.  BNP: Invalid input(s): POCBNP  CBG: No results for input(s): GLUCAP in the last 168 hours.  Microbiology: Results for orders placed or performed during the hospital encounter of 07/01/12  MRSA PCR Screening     Status: None   Collection Time: 07/01/12  6:34 PM   Specimen: Nasal Mucosa; Nasopharyngeal  Result Value Ref Range Status    MRSA by PCR NEGATIVE NEGATIVE Final    Comment:        The GeneXpert MRSA Assay (FDA approved for NASAL specimens only), is one component of a comprehensive MRSA colonization surveillance program. It is not intended to diagnose MRSA infection nor to guide or monitor treatment for MRSA infections.    Coagulation Studies: Recent Labs    04/02/19 0544 04/03/19 0626 04/04/19 0640  LABPROT 30.0* 30.8* 25.3*  INR 2.9* 3.0* 2.3*    Urinalysis: No results for input(s): COLORURINE, LABSPEC, PHURINE, GLUCOSEU, HGBUR, BILIRUBINUR, KETONESUR, PROTEINUR, UROBILINOGEN, NITRITE, LEUKOCYTESUR in the last 72 hours.  Invalid input(s): APPERANCEUR    Imaging: No results found.   Medications:       Assessment/ Plan:  63 y.o. male with a PMHx of ESRD previously on peritoneal dialysis now on hemodialysis, anemia of chronic kidney disease, secondary hyperparathyroidism, coarctation of the aorta, atrial fibrillation/flutter, mechanical aortic valve on anticoagulation, hypertension, coronary artery disease status post PCI, congenital bicuspid aortic valve, chronic hepatitis C who was admitted to [facility] on 03/25/2019 for ongoing treatment of ESRD as well as physical therapy for debility.   1.  ESRD on HD, previously on peritoneal dialysis but taken off secondary to bacterial peritonitis.     -Patient signed off of dialysis early yesterday secondary to gluteal pain.  We will plan for hemodialysis again tomorrow with ultrafiltration target of 2 kg.  2.  Anemia chronic kidney disease.  Hemoglobin currently 8.5.  Maintain the patient on Retacrit.  3.  Secondary hyperparathyroidism.  Phosphorus of 4.3 and acceptable.  4.  Hypotension.  Continue midodrine as well as albumin support.  5.  Protein calorie malnutrition.  Continue protein supplementation.    LOS: 0 Rolande Moe 12/4/20208:16 AM

## 2019-04-05 ENCOUNTER — Encounter: Payer: Self-pay | Admitting: Occupational Therapy

## 2019-04-05 LAB — RENAL FUNCTION PANEL
Albumin: 1.8 g/dL — ABNORMAL LOW (ref 3.5–5.0)
Anion gap: 14 (ref 5–15)
BUN: 55 mg/dL — ABNORMAL HIGH (ref 8–23)
CO2: 24 mmol/L (ref 22–32)
Calcium: 8.7 mg/dL — ABNORMAL LOW (ref 8.9–10.3)
Chloride: 100 mmol/L (ref 98–111)
Creatinine, Ser: 6.43 mg/dL — ABNORMAL HIGH (ref 0.61–1.24)
GFR calc Af Amer: 10 mL/min — ABNORMAL LOW (ref >60)
GFR calc non Af Amer: 8 mL/min — ABNORMAL LOW (ref >60)
Glucose, Bld: 91 mg/dL (ref 70–99)
Phosphorus: 4.9 mg/dL — ABNORMAL HIGH (ref 2.5–4.6)
Potassium: 4 mmol/L (ref 3.5–5.1)
Sodium: 138 mmol/L (ref 135–145)

## 2019-04-05 LAB — CBC
HCT: 27.3 % — ABNORMAL LOW (ref 39.0–52.0)
Hemoglobin: 8 g/dL — ABNORMAL LOW (ref 13.0–17.0)
MCH: 28.9 pg (ref 26.0–34.0)
MCHC: 29.3 g/dL — ABNORMAL LOW (ref 30.0–36.0)
MCV: 98.6 fL (ref 80.0–100.0)
Platelets: 501 10*3/uL — ABNORMAL HIGH (ref 150–400)
RBC: 2.77 MIL/uL — ABNORMAL LOW (ref 4.22–5.81)
RDW: 17.5 % — ABNORMAL HIGH (ref 11.5–15.5)
WBC: 9.9 10*3/uL (ref 4.0–10.5)
nRBC: 0 % (ref 0.0–0.2)

## 2019-04-05 LAB — PROTIME-INR
INR: 2.1 — ABNORMAL HIGH (ref 0.8–1.2)
Prothrombin Time: 23.4 seconds — ABNORMAL HIGH (ref 11.4–15.2)

## 2019-04-05 NOTE — PMR Pre-admission (Shared)
PMR Admission Coordinator Pre-Admission Assessment  Patient: Calvin [name] is an 63 y.o., male MRN: [id number] DOB: [birth year] Height: '6\' 2"'  (1.88 m) Weight: (!) 139.8 kg  Insurance Information HMO:     PPO:      PCP:      IPA:      80/20: yes     OTHER:  PRIMARY: Medicare A and B      Policy#: [id number]      Subscriber: Patient CM Name:       Phone#:      Fax#:  Pre-Cert#:       Employer:  Benefits:  Phone #: online     Name: verified eligibility online via [place] on 04/28/2019 Eff. Date: Part A and B effective 11/30/2014     Deduct: $1,408      Out of Pocket Max: NA      Life Max: NA CIR: Covered per medicare guidelines once yearly deductible has been met      SNF: days 1-20, 100%, days 21-100, 80% Outpatient: 80%     Co-Pay: 20% Home Health: 100%      Co-Pay: 0% DME: 80%     Co-Pay: 20% Providers: Pt's choice SECONDARY: [employer]      Policy#: [id number]      Subscriber: Patient CM Name:       Phone#:      Fax#:  Pre-Cert#:       Employer:  Benefits:  Phone #: [phone]     Name:  Eff. Date:      Deduct:       Out of Pocket Max:       Life Max:  CIR:       SNF:  Outpatient:     Co-Pay:  Home Health:       Co-Pay:  DME:      Co-Pay:   Medicaid Application Date:       Case Manager:  Disability Application Date:       Case Worker:   The "Data Collection Information Summary" for patients in Inpatient Rehabilitation Facilities with attached "Privacy Act [place] Records" was provided and verbally reviewed with: Patient  Emergency Contact Information Contact Information    Name Relation Home Work Mobile   Gagen,Calvin [name] [phone]  [phone]      Current Medical History  Patient Admitting Diagnosis: Debility after peritonitis requiring removal of PD catheter and transition to HD.   History of Present Illness: Pt is a 63 yo Male with history of atrial fibrillation/flutter, mechanical aortic valve, on chronic Coumadin, ESRD formally on PD, HTN,  anxiety, depression, colon polyps, congential heart disease, GERD, and hepatitis C. Pt was recently admitted to an outside hospital with presumed bacterial peritonitis in the setting of PD. He originally went home with hospice but ended up changing his [name] and continued on dialysis. Pt's bacterial peritonitis was suspected to be secondary to an infected peritoneal dialysis catheter. Pt underwent removal of the PD catheter on Nov 19th and was placed on vancomycin and meropenum through December 4th. The patient transitioned from PD to HD via PermCath 3x/week, currently Tuesday, Thursday, and Saturday. Pt has iron-deficiency anemia s/p transfused and continued with Epogen and has been maintained on Retacrit for anemia of chronic kidney disease. Pt required heparin drip with Coumadin bridge with follow up of PT/INR closely. Of note, pt does have deep tissue injury/unstagable wound to the left foot. He has been on Renvela for secondary hyperparathyroidism and hypotension has been treated  with midodrine and periodic use of albumin. Pt has been evaluated by therapies due to debility and overall deconditioning with recommendation for CIR. Pt is to be admitted to CIR on 04/11/2019.     Patient's medical record from [facility] has been reviewed by the rehabilitation admission coordinator and physician.  Past Medical History  Past Medical History:  Diagnosis Date  . Anemia   . Anemia of renal disease 10/04/2011  . Aortic aneurysm ([place])   . Atrial fibrillation ([place])   . Atrial flutter ([place])   . AVD (aortic valve disease)   . Bilateral lower extremity edema 07/03/2012  . CAD (coronary artery disease)   . Chronic anticoagulation 07/10/2012  . CKD (chronic kidney disease)    baseline creatinine is 1.5-2  . CKD (chronic kidney disease) stage 3, GFR 30-59 ml/min (HCC) 07/02/2012  . Coarctation of aorta (previous repair and stent) 04/05/2011  . Colonic ulcer 2007   felt to be secondary to NSAID  .  Complex coarctation of the aorta   . Gastric polyp 2007, 06/2012   benign.  . Gout   . Gout 07/03/2012  . Hepatitis C   . History of atrial flutter s/p DCCV 04/05/2011  . HTN (hypertension)   . Hyperlipidemia   . Hypertension 04/05/2011  . Long term (current) use of anticoagulants 12/12/2013  . Obesity   . PONV (postoperative nausea and vomiting)   . Renal insufficiency   . S/P aortic valve replacement 04/05/2011  . 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 Testicular adenoma   . Thoracic aortic aneurysm ([place]) 04/05/2011    Family History   family history includes Heart disease in his unknown relative.  Prior Rehab/Hospitalizations Has the patient had prior rehab or hospitalizations prior to admission? Yes  Has the patient had major surgery during 100 days prior to admission? Yes   Current Medications See MAR for details No current outpatient medications on file.  Patients Current Diet:  Diet Order    None      Precautions / Restrictions     Has the patient had 2 or more falls or a fall with injury in the past year? Yes  Prior Activity Level Pt is a household ambulator, requiring use of rollator for mobility and close contact guard from wife. Pt required Min A for most ADLs, with Supervision for bathing, Min A for shower transfer, Mod A for toileting, Min A for dressing, and Mod I for feeding. Pt was able to ambulate up to 40 feet at home. Pt would get out of the house approx 3x/month (MD appointments) but would go out of porch daily.   Prior Functional Level Self Care: Did the patient need help bathing, dressing, using the toilet or eating? Needed some help  Indoor Mobility: Did the patient need assistance with walking from room to room (with or without device)? Needed some help  Stairs: Did the patient need assistance with internal or external stairs (with or without device)? Dependent  Functional Cognition: Did the patient need help planning regular tasks  such as shopping or remembering to take medications? Needed some help  Home Assistive Devices / Equipment    Prior Device Use: Indicate devices/aids used by the patient prior to current illness, exacerbation or injury? Rollator, wheelchair, stair lift.   Prior Functional Level Current Functional Level  Bed Mobility  Min A Supine-sit: CGA/Min A; sit-supine: Mo A; rolling: mod/max   Transfers  Min A  Mod A +2  for sit to stand; Mod/Max A +1 for slideboard transfers   Mobility - Walk/Wheelchair  Min G Dependent   Upper Body Dressing  Min A  Mod A   Lower Body Dressing  Mod A  Dependent   Grooming  Set up/min A   Mod A   Eating/Drinking  Set up  Mod A   Toilet Transfer  Min A  Not tested   Bladder Continence   Anuric   Anuric   Bowel Management  Continent  Dependent   Stair Climbing  Dependent  Not Tested   Communication  WNL  WNL   Memory  WNL WNL   Driving  Dependent     Special needs/care consideration BiPAP/CPAP : no CPM : no Continuous Drip IV : no Dialysis : yes, new to HD        Days T/TH/Sat  Life Vest : no Oxygen : O2 at nighttime Special Bed : no Trach Size : no Wound Vac (area) : no      Location : NA Skin : unstagable pressure injury to sacrococcygeal, unstagable pressure injury to to Left heel, Left mid abdomen surgical incision with sutures in place, perianal denuded skin areas, abrasions to all toes bilaterally.             Bowel mgmt: last BM: 04/16/2019, loose Bladder mgmt: anuric   Diabetic mgmt: no Behavioral consideration : does not like to be rushed when explaining.  Chemo/radiation : history of testicular cancer requiring radiation (been > 1 yr); not current.    Previous Home Environment (from acute therapy documentation) Patient lives in a 2 story home with his wife and two grown children (both > 25 yrs). Pt only stays on the first floor with no need to utilize 2nd story of his home. He uses a stair lift to get from the  garage to the main level. He utilizes a Midwife for most mobility (per wife needs to be able to ambulation 40 feet to get to most areas of his home). He has a wc, grab bars in a new curbless shower, and an elevated toilet with risers.   Discharge Living Setting Plan is to return to his 2 story home with his wife and two grown children (both > 25 yrs). Pt will only need to stay on the first floor with no need to utilize 2nd story of his home. He uses a stair lift to get from the garage to the main level. He utilizes a Midwife for most mobility (per wife will only need to be able to ambulation 40 feet to get to most areas of his home). He has a wc, grab bars in a new curbless shower, and an elevated toilet with risers.  The patient does not have any problems obtaining their medications.   Social/Family/Support Systems Pt has a very supportive wife Keisean Skowron: [phone]) and grown children that have assisted him at baseline. Pt's wife is able to provide up to Max A, and she has experience with body mechanics (she is a power lifter and appears to be very involved in patient's care and wanting to know how best to utilize his strength and body mechanics to reduce burden on the family). Wife is available 24/7. DC plan has been discussed with primary caregiver (wife). She is in agreement with the plan. There are no issues with lodging or transportation while the patient is in rehab.   Goals/Additional Needs    Patient/family goals for rehab:  PT:  Min G OT: Min A SLP: NA ELOS: 14-18 days Cultural considerations: Christian Dietary Needs: heart healthy, 2 g sodium, thin liquid diet with 1500 mL fluid restriction per day Special Service Needs: new to HD, has been clipped to [facility] T/TH/Sat 11:30.  Pt/Family Agrees to Admission and willing to participate: yes Program orientaiton provided and reviewed with pt/caregiver including roles and responsibilities: Yes, pt and  wife  Barriers to discharge: Medical stability; new to HD.      Decrease burden of Care through IP rehab admission: NA  Possible need for SNF placement upon discharge: Not anticipated. Pt and his wife are committed to returning home after IP Rehab stay and they are willing to obtain equipment and even add hired caregiver assist as needed for a safe return home. Anticipate pt will be able to return home near baseline after CIR stay.   Patient Condition: I have reviewed medical records from [facility], spoken with CM, and patient and [name]. I met with patient at the bedside for inpatient rehabilitation assessment.  Patient will benefit from ongoing PT and OT, can actively participate in 3 hours of therapy a day 5 days of the week, and can make measurable gains during the admission.  Patient will also benefit from the coordinated team approach during an Inpatient Acute Rehabilitation admission.  The patient will receive intensive therapy as well as Rehabilitation physician, nursing, social worker, and care management interventions.  Due to bladder management, bowel management, safety, skin/wound care, disease management, medication administration, pain management and patient education the patient requires 24 hour a day rehabilitation nursing.  The patient is currently Mod A +2 sit to stand, Mod/Max A +1 slideboard transfers, no gain yet and Mod to Dependent for basic ADLs.  Discharge setting and therapy post discharge at home with home health is anticipated.  Patient has agreed to participate in the Acute Inpatient Rehabilitation Program and will admit 04/13/2019.  Preadmission Screen Completed By:  Raechel Ache, 04/05/2019 9:05 AM ______________________________________________________________________   Discussed status with Dr. Ranell Patrick on 04/10/2019 at 11:23AM and received approval for admission today.  Admission Coordinator:  Raechel Ache, OT, time 11:23AM/Date 04/08/2019    Assessment/Plan: Diagnosis: 1. Does the need for close, 24 hr/day Medical supervision in concert with the patient's rehab needs make it unreasonable for this patient to be served in a less intensive setting? {yes_no_potentially:3041433} 2. Co-Morbidities requiring supervision/potential complications: *** 3. Due to {due RS:8546270}, does the patient require 24 hr/day rehab nursing? {yes_no_potentially:3041433} 4. Does the patient require coordinated care of a physician, rehab nurse, PT, OT, and SLP to address physical and functional deficits in the context of the above medical diagnosis(es)? {yes_no_potentially:3041433} Addressing deficits in the following areas: {deficits:3041436} 5. Can the patient actively participate in an intensive therapy program of at least 3 hrs of therapy 5 days a week? {yes_no_potentially:3041433} 6. The potential for patient to make measurable gains while on inpatient rehab is {potential:3041437} 7. Anticipated functional outcomes upon discharge from inpatient rehab: {functional outcomes:304600100} PT, {functional outcomes:304600100} OT, {functional outcomes:304600100} SLP 8. Estimated rehab length of stay to reach the above functional goals is: *** 9. Anticipated discharge destination: {anticipated dc setting:21604} 10. Overall Rehab/Functional Prognosis: {potential:3041437}  MD Signature ***

## 2019-04-06 LAB — PROTIME-INR
INR: 2.1 — ABNORMAL HIGH (ref 0.8–1.2)
Prothrombin Time: 23.4 seconds — ABNORMAL HIGH (ref 11.4–15.2)

## 2019-04-07 ENCOUNTER — Other Ambulatory Visit: Payer: Self-pay

## 2019-04-07 ENCOUNTER — Inpatient Hospital Stay (HOSPITAL_COMMUNITY)
Admission: RE | Admit: 2019-04-07 | Discharge: 2019-06-02 | DRG: 945 | Disposition: E | Payer: Medicare Other | Source: Other Acute Inpatient Hospital | Attending: Physical Medicine & Rehabilitation | Admitting: Physical Medicine & Rehabilitation

## 2019-04-07 ENCOUNTER — Encounter (HOSPITAL_COMMUNITY): Payer: Self-pay | Admitting: *Deleted

## 2019-04-07 DIAGNOSIS — K219 Gastro-esophageal reflux disease without esophagitis: Secondary | ICD-10-CM | POA: Diagnosis present

## 2019-04-07 DIAGNOSIS — K6289 Other specified diseases of anus and rectum: Secondary | ICD-10-CM

## 2019-04-07 DIAGNOSIS — I2510 Atherosclerotic heart disease of native coronary artery without angina pectoris: Secondary | ICD-10-CM | POA: Diagnosis present

## 2019-04-07 DIAGNOSIS — Z888 Allergy status to other drugs, medicaments and biological substances status: Secondary | ICD-10-CM

## 2019-04-07 DIAGNOSIS — I959 Hypotension, unspecified: Secondary | ICD-10-CM | POA: Diagnosis present

## 2019-04-07 DIAGNOSIS — J90 Pleural effusion, not elsewhere classified: Secondary | ICD-10-CM | POA: Diagnosis present

## 2019-04-07 DIAGNOSIS — D631 Anemia in chronic kidney disease: Secondary | ICD-10-CM | POA: Diagnosis present

## 2019-04-07 DIAGNOSIS — L98419 Non-pressure chronic ulcer of buttock with unspecified severity: Secondary | ICD-10-CM | POA: Diagnosis present

## 2019-04-07 DIAGNOSIS — I4892 Unspecified atrial flutter: Secondary | ICD-10-CM | POA: Diagnosis present

## 2019-04-07 DIAGNOSIS — L89156 Pressure-induced deep tissue damage of sacral region: Secondary | ICD-10-CM | POA: Diagnosis present

## 2019-04-07 DIAGNOSIS — R5381 Other malaise: Secondary | ICD-10-CM | POA: Diagnosis present

## 2019-04-07 DIAGNOSIS — Z6838 Body mass index (BMI) 38.0-38.9, adult: Secondary | ICD-10-CM | POA: Diagnosis not present

## 2019-04-07 DIAGNOSIS — D638 Anemia in other chronic diseases classified elsewhere: Secondary | ICD-10-CM | POA: Diagnosis not present

## 2019-04-07 DIAGNOSIS — Z79891 Long term (current) use of opiate analgesic: Secondary | ICD-10-CM

## 2019-04-07 DIAGNOSIS — M19011 Primary osteoarthritis, right shoulder: Secondary | ICD-10-CM | POA: Diagnosis present

## 2019-04-07 DIAGNOSIS — I4901 Ventricular fibrillation: Secondary | ICD-10-CM | POA: Diagnosis not present

## 2019-04-07 DIAGNOSIS — K3184 Gastroparesis: Secondary | ICD-10-CM | POA: Diagnosis present

## 2019-04-07 DIAGNOSIS — Z8547 Personal history of malignant neoplasm of testis: Secondary | ICD-10-CM

## 2019-04-07 DIAGNOSIS — E1122 Type 2 diabetes mellitus with diabetic chronic kidney disease: Secondary | ICD-10-CM | POA: Diagnosis present

## 2019-04-07 DIAGNOSIS — Z952 Presence of prosthetic heart valve: Secondary | ICD-10-CM

## 2019-04-07 DIAGNOSIS — F411 Generalized anxiety disorder: Secondary | ICD-10-CM | POA: Diagnosis present

## 2019-04-07 DIAGNOSIS — I462 Cardiac arrest due to underlying cardiac condition: Secondary | ICD-10-CM | POA: Diagnosis not present

## 2019-04-07 DIAGNOSIS — K529 Noninfective gastroenteritis and colitis, unspecified: Secondary | ICD-10-CM | POA: Diagnosis present

## 2019-04-07 DIAGNOSIS — R001 Bradycardia, unspecified: Secondary | ICD-10-CM | POA: Diagnosis not present

## 2019-04-07 DIAGNOSIS — N2581 Secondary hyperparathyroidism of renal origin: Secondary | ICD-10-CM | POA: Diagnosis present

## 2019-04-07 DIAGNOSIS — G8929 Other chronic pain: Secondary | ICD-10-CM | POA: Diagnosis present

## 2019-04-07 DIAGNOSIS — D72829 Elevated white blood cell count, unspecified: Secondary | ICD-10-CM | POA: Diagnosis not present

## 2019-04-07 DIAGNOSIS — Z79899 Other long term (current) drug therapy: Secondary | ICD-10-CM

## 2019-04-07 DIAGNOSIS — Z8619 Personal history of other infectious and parasitic diseases: Secondary | ICD-10-CM

## 2019-04-07 DIAGNOSIS — Z992 Dependence on renal dialysis: Secondary | ICD-10-CM | POA: Diagnosis not present

## 2019-04-07 DIAGNOSIS — Z8711 Personal history of peptic ulcer disease: Secondary | ICD-10-CM

## 2019-04-07 DIAGNOSIS — Z7901 Long term (current) use of anticoagulants: Secondary | ICD-10-CM

## 2019-04-07 DIAGNOSIS — J9811 Atelectasis: Secondary | ICD-10-CM | POA: Diagnosis present

## 2019-04-07 DIAGNOSIS — E785 Hyperlipidemia, unspecified: Secondary | ICD-10-CM | POA: Diagnosis present

## 2019-04-07 DIAGNOSIS — I953 Hypotension of hemodialysis: Secondary | ICD-10-CM | POA: Diagnosis not present

## 2019-04-07 DIAGNOSIS — D649 Anemia, unspecified: Secondary | ICD-10-CM | POA: Diagnosis not present

## 2019-04-07 DIAGNOSIS — R05 Cough: Secondary | ICD-10-CM | POA: Diagnosis present

## 2019-04-07 DIAGNOSIS — M7918 Myalgia, other site: Secondary | ICD-10-CM | POA: Diagnosis present

## 2019-04-07 DIAGNOSIS — R296 Repeated falls: Secondary | ICD-10-CM | POA: Diagnosis present

## 2019-04-07 DIAGNOSIS — Z87891 Personal history of nicotine dependence: Secondary | ICD-10-CM

## 2019-04-07 DIAGNOSIS — E1140 Type 2 diabetes mellitus with diabetic neuropathy, unspecified: Secondary | ICD-10-CM | POA: Diagnosis present

## 2019-04-07 DIAGNOSIS — L8990 Pressure ulcer of unspecified site, unspecified stage: Secondary | ICD-10-CM | POA: Insufficient documentation

## 2019-04-07 DIAGNOSIS — I712 Thoracic aortic aneurysm, without rupture: Secondary | ICD-10-CM | POA: Diagnosis present

## 2019-04-07 DIAGNOSIS — R159 Full incontinence of feces: Secondary | ICD-10-CM | POA: Diagnosis present

## 2019-04-07 DIAGNOSIS — N186 End stage renal disease: Secondary | ICD-10-CM | POA: Diagnosis present

## 2019-04-07 DIAGNOSIS — R791 Abnormal coagulation profile: Secondary | ICD-10-CM | POA: Diagnosis not present

## 2019-04-07 DIAGNOSIS — E8889 Other specified metabolic disorders: Secondary | ICD-10-CM | POA: Diagnosis present

## 2019-04-07 DIAGNOSIS — L89620 Pressure ulcer of left heel, unstageable: Secondary | ICD-10-CM | POA: Diagnosis present

## 2019-04-07 DIAGNOSIS — I9589 Other hypotension: Secondary | ICD-10-CM | POA: Diagnosis present

## 2019-04-07 DIAGNOSIS — M109 Gout, unspecified: Secondary | ICD-10-CM | POA: Diagnosis present

## 2019-04-07 DIAGNOSIS — Z8719 Personal history of other diseases of the digestive system: Secondary | ICD-10-CM

## 2019-04-07 DIAGNOSIS — R059 Cough, unspecified: Secondary | ICD-10-CM

## 2019-04-07 DIAGNOSIS — F329 Major depressive disorder, single episode, unspecified: Secondary | ICD-10-CM | POA: Diagnosis present

## 2019-04-07 DIAGNOSIS — R0602 Shortness of breath: Secondary | ICD-10-CM | POA: Diagnosis present

## 2019-04-07 DIAGNOSIS — I4891 Unspecified atrial fibrillation: Secondary | ICD-10-CM | POA: Diagnosis present

## 2019-04-07 DIAGNOSIS — E8770 Fluid overload, unspecified: Secondary | ICD-10-CM | POA: Diagnosis present

## 2019-04-07 DIAGNOSIS — R195 Other fecal abnormalities: Secondary | ICD-10-CM | POA: Diagnosis not present

## 2019-04-07 DIAGNOSIS — I120 Hypertensive chronic kidney disease with stage 5 chronic kidney disease or end stage renal disease: Secondary | ICD-10-CM | POA: Diagnosis present

## 2019-04-07 DIAGNOSIS — R278 Other lack of coordination: Secondary | ICD-10-CM | POA: Diagnosis present

## 2019-04-07 DIAGNOSIS — Z955 Presence of coronary angioplasty implant and graft: Secondary | ICD-10-CM

## 2019-04-07 LAB — PROTIME-INR
INR: 2.1 — ABNORMAL HIGH (ref 0.8–1.2)
Prothrombin Time: 23.3 seconds — ABNORMAL HIGH (ref 11.4–15.2)

## 2019-04-07 LAB — SARS CORONAVIRUS 2 (TAT 6-24 HRS): SARS Coronavirus 2: NEGATIVE

## 2019-04-07 MED ORDER — ALPRAZOLAM 0.5 MG PO TABS: 0.5000 mg | ORAL_TABLET | Freq: Three times a day (TID) | ORAL | Status: DC | PRN

## 2019-04-07 MED ORDER — WARFARIN SODIUM 5 MG PO TABS
5.0000 mg | ORAL_TABLET | Freq: Once | ORAL | Status: AC
  Administered 2019-04-07: 5 mg via ORAL
  Filled 2019-04-07: qty 1

## 2019-04-07 MED ORDER — CALCIUM CARBONATE ANTACID 500 MG PO CHEW: 1.0000 | CHEWABLE_TABLET | Freq: Three times a day (TID) | ORAL | Status: DC | PRN

## 2019-04-07 MED ORDER — SERTRALINE HCL 50 MG PO TABS
50.0000 mg | ORAL_TABLET | Freq: Every day | ORAL | Status: DC
  Administered 2019-04-08 – 2019-04-19 (×12): 50 mg via ORAL
  Filled 2019-04-07 (×12): qty 1

## 2019-04-07 MED ORDER — ZOLPIDEM TARTRATE 5 MG PO TABS
5.0000 mg | ORAL_TABLET | Freq: Every evening | ORAL | Status: DC | PRN
  Administered 2019-04-07 – 2019-04-09 (×3): 5 mg via ORAL
  Filled 2019-04-07 (×4): qty 1

## 2019-04-07 MED ORDER — CALCITRIOL 0.25 MCG PO CAPS
0.2500 ug | ORAL_CAPSULE | ORAL | Status: DC
  Administered 2019-04-08 – 2019-04-22 (×5): 0.25 ug via ORAL
  Filled 2019-04-07 (×8): qty 1

## 2019-04-07 MED ORDER — WARFARIN - PHARMACIST DOSING INPATIENT
Freq: Every day | Status: DC
  Administered 2019-04-07 – 2019-04-13 (×5)

## 2019-04-07 MED ORDER — MILK AND MOLASSES ENEMA
1.0000 | Freq: Every day | RECTAL | Status: DC | PRN
  Filled 2019-04-07: qty 240

## 2019-04-07 MED ORDER — CALCIUM POLYCARBOPHIL 625 MG PO TABS
625.0000 mg | ORAL_TABLET | Freq: Three times a day (TID) | ORAL | Status: DC
  Administered 2019-04-07 – 2019-05-09 (×86): 625 mg via ORAL
  Filled 2019-04-07 (×91): qty 1

## 2019-04-07 MED ORDER — SIMETHICONE 80 MG PO CHEW: 80.0000 mg | CHEWABLE_TABLET | Freq: Four times a day (QID) | ORAL | Status: DC | PRN

## 2019-04-07 MED ORDER — PROCHLORPERAZINE MALEATE 5 MG PO TABS
5.0000 mg | ORAL_TABLET | Freq: Four times a day (QID) | ORAL | Status: DC | PRN
  Administered 2019-04-27 – 2019-05-03 (×5): 10 mg via ORAL
  Filled 2019-04-07 (×5): qty 2

## 2019-04-07 MED ORDER — BISACODYL 10 MG RE SUPP: 10.0000 mg | Freq: Every day | RECTAL | Status: DC | PRN

## 2019-04-07 MED ORDER — PREGABALIN 25 MG PO CAPS
25.0000 mg | ORAL_CAPSULE | Freq: Three times a day (TID) | ORAL | Status: DC
  Administered 2019-04-07 – 2019-04-08 (×2): 25 mg via ORAL
  Filled 2019-04-07 (×2): qty 1

## 2019-04-07 MED ORDER — PROCHLORPERAZINE 25 MG RE SUPP: 12.5000 mg | Freq: Four times a day (QID) | RECTAL | Status: DC | PRN

## 2019-04-07 MED ORDER — DIPHENHYDRAMINE HCL 12.5 MG/5ML PO ELIX: 12.5000 mg | ORAL_SOLUTION | Freq: Four times a day (QID) | ORAL | Status: DC | PRN

## 2019-04-07 MED ORDER — GUAIFENESIN 100 MG/5ML PO SOLN
5.0000 mL | ORAL | Status: DC | PRN
  Administered 2019-04-10 – 2019-05-04 (×2): 100 mg via ORAL
  Filled 2019-04-07: qty 5
  Filled 2019-04-07: qty 25
  Filled 2019-04-07: qty 5

## 2019-04-07 MED ORDER — METOPROLOL TARTRATE 12.5 MG HALF TABLET
12.5000 mg | ORAL_TABLET | Freq: Two times a day (BID) | ORAL | Status: DC
  Administered 2019-04-07 – 2019-04-20 (×18): 12.5 mg via ORAL
  Filled 2019-04-07 (×22): qty 1

## 2019-04-07 MED ORDER — SACCHAROMYCES BOULARDII 250 MG PO CAPS
250.0000 mg | ORAL_CAPSULE | Freq: Two times a day (BID) | ORAL | Status: DC
  Administered 2019-04-07 – 2019-04-08 (×2): 250 mg via ORAL
  Filled 2019-04-07 (×2): qty 1

## 2019-04-07 MED ORDER — ALUM & MAG HYDROXIDE-SIMETH 200-200-20 MG/5ML PO SUSP: 30.0000 mL | ORAL | Status: DC | PRN

## 2019-04-07 MED ORDER — OXYCODONE HCL 5 MG PO TABS
5.0000 mg | ORAL_TABLET | Freq: Four times a day (QID) | ORAL | Status: DC | PRN
  Administered 2019-04-15 – 2019-05-09 (×20): 5 mg via ORAL
  Filled 2019-04-07 (×26): qty 1

## 2019-04-07 MED ORDER — RENA-VITE PO TABS
1.0000 | ORAL_TABLET | Freq: Every day | ORAL | Status: DC
  Administered 2019-04-07 – 2019-05-08 (×30): 1 via ORAL
  Filled 2019-04-07 (×30): qty 1

## 2019-04-07 MED ORDER — DIPHENOXYLATE-ATROPINE 2.5-0.025 MG/5ML PO LIQD
5.0000 mL | Freq: Four times a day (QID) | ORAL | Status: DC | PRN
  Filled 2019-04-07: qty 5

## 2019-04-07 MED ORDER — FLEET ENEMA 7-19 GM/118ML RE ENEM: 1.0000 | ENEMA | Freq: Once | RECTAL | Status: DC | PRN

## 2019-04-07 MED ORDER — GLYCERIN (LAXATIVE) 2.1 G RE SUPP
1.0000 | Freq: Every day | RECTAL | Status: DC | PRN
  Filled 2019-04-07 (×2): qty 1

## 2019-04-07 MED ORDER — PROCHLORPERAZINE EDISYLATE 10 MG/2ML IJ SOLN
5.0000 mg | Freq: Four times a day (QID) | INTRAMUSCULAR | Status: DC | PRN
  Administered 2019-04-24: 10 mg via INTRAMUSCULAR
  Filled 2019-04-07: qty 2

## 2019-04-07 MED ORDER — PRO-STAT SUGAR FREE PO LIQD
30.0000 mL | Freq: Two times a day (BID) | ORAL | Status: DC
  Administered 2019-04-07 – 2019-04-27 (×36): 30 mL via ORAL
  Filled 2019-04-07 (×40): qty 30

## 2019-04-07 MED ORDER — ACETAMINOPHEN 325 MG PO TABS
325.0000 mg | ORAL_TABLET | ORAL | Status: DC | PRN
  Administered 2019-04-18 – 2019-05-09 (×2): 650 mg via ORAL
  Filled 2019-04-07 (×3): qty 2

## 2019-04-07 MED ORDER — TRAZODONE HCL 50 MG PO TABS: 25.0000 mg | ORAL_TABLET | Freq: Every evening | ORAL | Status: DC | PRN

## 2019-04-07 MED ORDER — SEVELAMER CARBONATE 800 MG PO TABS
800.0000 mg | ORAL_TABLET | Freq: Three times a day (TID) | ORAL | Status: DC
  Administered 2019-04-07 – 2019-04-11 (×9): 800 mg via ORAL
  Filled 2019-04-07 (×10): qty 1

## 2019-04-07 MED ORDER — MIDODRINE HCL 5 MG PO TABS
10.0000 mg | ORAL_TABLET | Freq: Three times a day (TID) | ORAL | Status: DC
  Administered 2019-04-07 – 2019-05-09 (×93): 10 mg via ORAL
  Filled 2019-04-07 (×97): qty 2

## 2019-04-07 NOTE — Progress Notes (Addendum)
ANTICOAGULATION CONSULT NOTE - Initial Consult  Pharmacy Consult for Warfarin Indication: Afib/St Jude AVR  Patient Measurements: Height: 6\' 2"  (188 cm) Weight: (!) 319 lb 7.1 oz (144.9 kg) IBW/kg (Calculated) : 82.2  Vital Signs: Temp: 98.1 F (36.7 C) (12/07 1520) Temp Source: Oral (12/07 1520) BP: 119/51 (12/07 1520) Pulse Rate: 82 (12/07 1520)  Labs: Recent Labs    04/05/19 0517 04/06/19 0543 04/24/2019 0648  HGB 8.0*  --   --   HCT 27.3*  --   --   PLT 501*  --   --   LABPROT 23.4* 23.4* 23.3*  INR 2.1* 2.1* 2.1*  CREATININE 6.43*  --   --     Estimated Creatinine Clearance: 18.1 mL/min (A) (by C-G formula based on SCr of 6.43 mg/dL (H)).  Assessment: 62 YOM who was admitted to CIR on 12/7 from [facility] for rehabilitation after recent bacterial peritonitis treated with antibiotics. The patient was on warfarin for hx Afib, St Jude AVR - pharmacy consulted to resume dosing this admission.   Admit INR 2.1 - PTA dose listed as 5 mg daily. Give mech AVR and hx Afib - goal range assumed to be 2.5-3.5 however per discussion with the patient he stated that his cardiologist recommended that he aim for closer to 2.5 mid last year (2019). It is noted that the patient did have a recent GIB so perhaps this change was in relation to that - will aim for 2.5-3 while in CIR.   The patient states his last dose of warfarin was on 12/6 and that he typically takes 5 mg daily.   Goal of Therapy:  INR 2.5-3 Monitor platelets by anticoagulation protocol: Yes   Plan:  - Warfarin 5 mg x 1 dose at 1800 today - Daily PT/INR, CBC minimum of weekly while in CIR - Will continue to monitor for any signs/symptoms of bleeding and will follow up with PT/INR in the a.m.    Thank you for allowing pharmacy to be a part of this patient's care.  Alycia Rossetti, PharmD, BCPS Clinical Pharmacist Clinical phone for 04/23/2019: [phone] 04/26/2019 3:55 PM   **Pharmacist phone directory can now be found on  [email or web address] ([email or web address]).  Listed under [place].

## 2019-04-07 NOTE — Progress Notes (Signed)
[facility]  ROUNDING NOTE   Subjective:  Patient last had dialysis on Saturday. Due for dialysis again tomorrow. Resting comfortably in bed at the moment.  Objective:  Vital signs in last 24 hours:  Temperature 97.6 pulse 80 respirations 20 blood pressure 103/61  Physical Exam: General: No acute distress  Head: Normocephalic, atraumatic. Moist oral mucosal membranes  Eyes: Anicteric  Neck: Supple, trachea midline  Lungs:  Clear to auscultation, normal effort  Heart: M0N4 midsystolic click no rubs  Abdomen:  Soft, nontender, bowel sounds present  Extremities: Trace peripheral edema.  Neurologic: Resting comfortably  Skin: No lesions  Access: R IJ PC    Basic Metabolic Panel: Recent Labs  Lab 04/02/19 0544 04/03/19 0626 04/05/19 0517  NA 138 137 138  K 3.7 3.8 4.0  CL 98 97* 100  CO2 27 27 24   GLUCOSE 94 99 91  BUN 25* 42* 55*  CREATININE 4.25* 5.88* 6.43*  CALCIUM 8.8* 8.6* 8.7*  PHOS 3.5 4.3 4.9*    Liver Function Tests: Recent Labs  Lab 04/02/19 0544 04/03/19 0626 04/05/19 0517  ALBUMIN 1.7* 1.7* 1.8*   No results for input(s): LIPASE, AMYLASE in the last 168 hours. No results for input(s): AMMONIA in the last 168 hours.  CBC: Recent Labs  Lab 04/02/19 0544 04/03/19 0626 04/05/19 0517  WBC 8.3 9.4 9.9  HGB 8.6* 8.5* 8.0*  HCT 29.1* 28.4* 27.3*  MCV 98.0 97.9 98.6  PLT 431* 447* 501*    Cardiac Enzymes: No results for input(s): CKTOTAL, CKMB, CKMBINDEX, TROPONINI in the last 168 hours.  BNP: Invalid input(s): POCBNP  CBG: No results for input(s): GLUCAP in the last 168 hours.  Microbiology: Results for orders placed or performed during the hospital encounter of 07/01/12  MRSA PCR Screening     Status: None   Collection Time: 07/01/12  6:34 PM   Specimen: Nasal Mucosa; Nasopharyngeal  Result Value Ref Range Status   MRSA by PCR NEGATIVE NEGATIVE Final    Comment:        The GeneXpert MRSA Assay (FDA approved for NASAL  specimens only), is one component of a comprehensive MRSA colonization surveillance program. It is not intended to diagnose MRSA infection nor to guide or monitor treatment for MRSA infections.    Coagulation Studies: Recent Labs    04/05/19 0517 04/06/19 0543 04/26/2019 0648  LABPROT 23.4* 23.4* 23.3*  INR 2.1* 2.1* 2.1*    Urinalysis: No results for input(s): COLORURINE, LABSPEC, PHURINE, GLUCOSEU, HGBUR, BILIRUBINUR, KETONESUR, PROTEINUR, UROBILINOGEN, NITRITE, LEUKOCYTESUR in the last 72 hours.  Invalid input(s): APPERANCEUR    Imaging: No results found.   Medications:       Assessment/ Plan:  63 y.o. male with a PMHx of ESRD previously on peritoneal dialysis now on hemodialysis, anemia of chronic kidney disease, secondary hyperparathyroidism, coarctation of the aorta, atrial fibrillation/flutter, mechanical aortic valve on anticoagulation, hypertension, coronary artery disease status post PCI, congenital bicuspid aortic valve, chronic hepatitis C who was admitted to [facility] on 03/25/2019 for ongoing treatment of ESRD as well as physical therapy for debility.   1.  ESRD on HD, previously on peritoneal dialysis but taken off secondary to bacterial peritonitis.     -Patient due for dialysis again on Tuesday.  Orders to be prepared.  2.  Anemia chronic kidney disease.  Hemoglobin currently 8.0.  Maintain the patient on Retacrit.  3.  Secondary hyperparathyroidism.  Phosphorus 4.9 and acceptable.  4.  Hypotension.  Continue midodrine as well as albumin  support.  5.  Protein calorie malnutrition.  Continue protein supplementation.    LOS: 0 Kalen Neidert 12/7/20208:36 AM

## 2019-04-07 NOTE — Progress Notes (Signed)
Izora Ribas, MD  Physician  Physical Medicine and Rehabilitation  PMR Pre-admission  Signed  Date of Service:  04/01/2019 12:11 PM      Related encounter: Admission (Discharged) from 03/25/2019 in [facility] Admission Coordinator Pre-Admission Assessment   Patient: Calvin [name] is an 63 y.o., male MRN: [id number] DOB: Oct 19, 1955 Height: '6\' 2"'$  (1.88 m) Weight: (!) 139.8 kg   Insurance Information HMO:     PPO:      PCP:      IPA:      80/20: yes     OTHER:  PRIMARY: Medicare A and B      Policy#: [id number]      Subscriber: Patient CM Name:       Phone#:      Fax#:  Pre-Cert#:       Employer:  Benefits:  Phone #: online     Name: verified eligibility online via [name] on 04/26/2019 Eff. Date: Part A and B effective 11/30/2014     Deduct: $1,408      Out of Pocket Max: NA      Life Max: NA CIR: Covered per medicare guidelines once yearly deductible has been met      SNF: days 1-20, 100%, days 21-100, 80% Outpatient: 80%     Co-Pay: 20% Home Health: 100%      Co-Pay: 0% DME: 80%     Co-Pay: 20% Providers: Pt's choice SECONDARY: [employer]      Policy#: [id number]      Subscriber: Patient CM Name:       Phone#:      Fax#:  Pre-Cert#:       Employer:  Benefits:  Phone #: [phone]     Name:  Eff. Date:      Deduct:       Out of Pocket Max:       Life Max:  CIR:       SNF:  Outpatient:     Co-Pay:  Home Health:       Co-Pay:  DME:      Co-Pay:    Medicaid Application Date:       Case Manager:  Disability Application Date:       Case Worker:    The "Data Collection Information Summary" for patients in Inpatient Rehabilitation Facilities with attached "Privacy Act [place] Records" was provided and verbally reviewed with: Patient   Emergency Contact Information         Contact Information     Name Relation Home Work Mobile    Golubski,Catherine Spouse [phone]   [phone]         Current Medical  History  Patient Admitting Diagnosis: Debility after peritonitis requiring removal of PD catheter and transition to HD.    History of Present Illness: Pt is a 63 yo Male with history of atrial fibrillation/flutter, mechanical aortic valve, on chronic Coumadin, ESRD formally on PD, HTN, anxiety, depression, colon polyps, congential heart disease, GERD, and hepatitis C. Pt was recently admitted to an outside hospital with presumed bacterial peritonitis in the setting of PD. He originally went home with hospice but ended up changing his [place] and continued on dialysis. Pt's bacterial peritonitis was suspected to be secondary to an infected peritoneal dialysis catheter. Pt underwent removal of the PD catheter on Nov 19th and was placed on vancomycin and meropenum through December 4th. The patient transitioned from  PD to HD via PermCath 3x/week, currently Tuesday, Thursday, and Saturday. Pt has iron-deficiency anemia s/p transfused and continued with Epogen and has been maintained on Retacrit for anemia of chronic kidney disease. Pt required heparin drip with Coumadin bridge with follow up of PT/INR closely. Of note, pt does have deep tissue injury/unstagable wound to the left foot. He has been on Renvela for secondary hyperparathyroidism and hypotension has been treated with midodrine and periodic use of albumin. Pt has been evaluated by therapies due to debility and overall deconditioning with recommendation for CIR. Pt is to be admitted to CIR on 04/04/2019.    Patient's medical record from [facility] has been reviewed by the rehabilitation admission coordinator and physician.   Past Medical History      Past Medical History:  Diagnosis Date  . Anemia    . Anemia of renal disease 10/04/2011  . Aortic aneurysm ([place])    . Atrial fibrillation ([place])    . Atrial flutter ([place])    . AVD (aortic valve disease)    . Bilateral lower extremity edema 07/03/2012  . CAD (coronary artery disease)    .  Chronic anticoagulation 07/10/2012  . CKD (chronic kidney disease)      baseline creatinine is 1.5-2  . CKD (chronic kidney disease) stage 3, GFR 30-59 ml/min (HCC) 07/02/2012  . Coarctation of aorta (previous repair and stent) 04/05/2011  . Colonic ulcer 2007    felt to be secondary to NSAID  . Complex coarctation of the aorta    . Gastric polyp 2007, 06/2012    benign.  . Gout    . Gout 07/03/2012  . Hepatitis C    . History of atrial flutter s/p DCCV 04/05/2011  . HTN (hypertension)    . Hyperlipidemia    . Hypertension 04/05/2011  . Long term (current) use of anticoagulants 12/12/2013  . Obesity    . PONV (postoperative nausea and vomiting)    . Renal insufficiency    . S/P aortic valve replacement 04/05/2011  . 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 Testicular adenoma    . Thoracic aortic aneurysm ([place]) 04/05/2011      Family History   family history includes Heart disease in his unknown relative.   Prior Rehab/Hospitalizations Has the patient had prior rehab or hospitalizations prior to admission? Yes   Has the patient had major surgery during 100 days prior to admission? Yes              Current Medications See MAR for details No current facility-administered medications for this encounter.    Patients Current Diet:     Diet Order                 Diet regular Room service appropriate? Yes; Fluid consistency: Thin; Fluid restriction: 1500 mL Fluid  Diet effective now                   Precautions / Restrictions      Has the patient had 2 or more falls or a fall with injury in the past year? Yes   Prior Activity Level Pt is a household ambulator, requiring use of rollator for mobility and close contact guard from wife. Pt required Min A for most ADLs, with Supervision for bathing, Min A for shower transfer, Mod A for toileting, Min A for dressing, and Mod I for feeding. Pt was able to ambulate up to 40 feet at home.  Pt would get out of the house approx  3x/month (MD appointments) but would go out of porch daily.    Prior Functional Level Self Care: Did the patient need help bathing, dressing, using the toilet or eating? Needed some help   Indoor Mobility: Did the patient need assistance with walking from room to room (with or without device)? Needed some help   Stairs: Did the patient need assistance with internal or external stairs (with or without device)? Dependent   Functional Cognition: Did the patient need help planning regular tasks such as shopping or remembering to take medications? Needed some help   Home Assistive Devices / Equipment   Prior Device Use: Indicate devices/aids used by the patient prior to current illness, exacerbation or injury? Rollator, wheelchair, stair lift.     Prior Functional Level Current Functional Level  Bed Mobility   Min A Supine-sit: CGA/Min A; sit-supine: Mo A; rolling: mod/max    Transfers   Min A   Mod A +2 for sit to stand; Mod/Max A +1 for slideboard transfers    Mobility - Walk/Wheelchair   Min G Dependent    Upper Body Dressing   Min A   Mod A    Lower Body Dressing   Mod A   Dependent    Grooming   Set up/min A    Mod A    Eating/Drinking   Set up   Mod A    Toilet Transfer   Min A   Not tested    Bladder Continence    Anuric    Anuric    Bowel Management   Continent   Dependent    Stair Climbing   Dependent   Not Tested    Communication   WNL   WNL    Memory   WNL WNL    Driving   Dependent        Special needs/care consideration BiPAP/CPAP : no CPM : no Continuous Drip IV : no Dialysis : yes, new to HD        Days T/TH/Sat  Life Vest : no Oxygen : O2 at nighttime Special Bed : no Trach Size : no Wound Vac (area) : no      Location : NA Skin : unstagable pressure injury to sacrococcygeal, unstagable pressure injury to to Left heel, Left mid abdomen surgical incision with sutures in place, perianal denuded skin areas, abrasions to all toes  bilaterally.             Bowel mgmt: last BM: 04/06/2019, loose Bladder mgmt: anuric   Diabetic mgmt: no Behavioral consideration : does not like to be rushed when explaining.  Chemo/radiation : history of testicular cancer requiring radiation (been > 1 yr); not current.     Previous Home Environment (from acute therapy documentation) Patient lives in a 2 story home with his wife and two grown children (both > 25 yrs). Pt only stays on the first floor with no need to utilize 2nd story of his home. He uses a stair lift to get from the garage to the main level. He utilizes a Midwife for most mobility (per wife needs to be able to ambulation 40 feet to get to most areas of his home). He has a wc, grab bars in a new curbless shower, and an elevated toilet with risers.    Discharge Living Setting Plan is to return to his 2 story home with his wife and two grown children (both > 25  yrs). Pt will only need to stay on the first floor with no need to utilize 2nd story of his home. He uses a stair lift to get from the garage to the main level. He utilizes a Midwife for most mobility (per wife will only need to be able to ambulation 40 feet to get to most areas of his home). He has a wc, grab bars in a new curbless shower, and an elevated toilet with risers.  The patient does not have any problems obtaining their medications.    Social/Family/Support Systems Pt has a very supportive wife Emiel Kielty: [phone]) and grown children that have assisted him at baseline. Pt's wife is able to provide up to Max A, and she has experience with body mechanics (she is a power lifter and appears to be very involved in patient's care and wanting to know how best to utilize his strength and body mechanics to reduce burden on the family). Wife is available 24/7. DC plan has been discussed with primary caregiver (wife). She is in agreement with the plan. There are no issues with lodging or  transportation while the patient is in rehab.    Goals/Additional Needs    Patient/family goals for rehab:  PT: Min G OT: Min A SLP: NA ELOS: 14-18 days Cultural considerations: Christian Dietary Needs: heart healthy, 2 g sodium, thin liquid diet with 1500 mL fluid restriction per day Special Service Needs: new to HD, has been clipped to [facility] T/TH/Sat 11:30.  Pt/Family Agrees to Admission and willing to participate: yes Program orientaiton provided and reviewed with pt/caregiver including roles and responsibilities: Yes, pt and wife  Barriers to discharge: Medical stability; new to HD.          Decrease burden of Care through IP rehab admission: NA   Possible need for SNF placement upon discharge: Not anticipated. Pt and his wife are committed to returning home after IP Rehab stay and they are willing to obtain equipment and even add hired caregiver assist as needed for a safe return home. Anticipate pt will be able to return home near baseline after CIR stay.    Patient Condition: I have reviewed medical records from [facility], spoken with CM, and patient and spouse. I met with patient at the bedside for inpatient rehabilitation assessment.  Patient will benefit from ongoing PT and OT, can actively participate in 3 hours of therapy a day 5 days of the week, and can make measurable gains during the admission.  Patient will also benefit from the coordinated team approach during an Inpatient Acute Rehabilitation admission.  The patient will receive intensive therapy as well as Rehabilitation physician, nursing, social worker, and care management interventions.  Due to bladder management, bowel management, safety, skin/wound care, disease management, medication administration, pain management and patient education the patient requires 24 hour a day rehabilitation nursing.  The patient is currently Mod A +2 sit to stand, Mod/Max A +1 slideboard transfers, no gain yet  and Mod to Dependent for basic ADLs.  Discharge setting and therapy post discharge at home with home health is anticipated.  Patient has agreed to participate in the Acute Inpatient Rehabilitation Program and will admit 04/29/2019.   Preadmission Screen Completed By:  Izora Ribas, 04/02/2019 12:11 PM ______________________________________________________________________   Discussed status with Dr. Ranell Patrick on 04/26/2019 at 11:23AM and received approval for admission today.   Admission Coordinator:  Izora Ribas, MD, time 11:23AM/Date 04/03/2019    Assessment/Plan: Diagnosis: Impaired  mobility and ADLs secondary to debility 1. Does the need for close, 24 hr/day Medical supervision in concert with the patient's rehab needs make it unreasonable for this patient to be served in a less intensive setting? Yes 2. Co-Morbidities requiring supervision/potential complications: anemia of renal disease, atrial fibrillation, seminoma, morbid obesity, s/p aortic valve replacement, CKF stage 3 3. Due to safety, skin/wound care, disease management, medication administration, pain management and patient education, does the patient require 24 hr/day rehab nursing? Yes 4. Does the patient require coordinated care of a physician, rehab nurse, PT and OT to address physical and functional deficits in the context of the above medical diagnosis(es)? Yes Addressing deficits in the following areas: balance, endurance, locomotion, strength, transferring, bowel/bladder control, bathing, dressing, feeding, grooming, toileting and psychosocial support 5. Can the patient actively participate in an intensive therapy program of at least 3 hrs of therapy 5 days a week? Yes 6. The potential for patient to make measurable gains while on inpatient rehab is excellent 7. Anticipated functional outcomes upon discharge from inpatient rehab: modified independent PT, modified independent OT, independent SLP 8. Estimated rehab length  of stay to reach the above functional goals is: 2-3 weeks 9. Anticipated discharge destination: Home 10. Overall Rehab/Functional Prognosis: excellent   MD Signature Leeroy Cha, MD

## 2019-04-07 NOTE — H&P (Signed)
Physical Medicine and Rehabilitation Admission H&P    CC: Debility    HPI: Calvin [name] is a 63 year old male with history of CAD s/p repair with stent of coarctation of aorta, St Jude's AVR, A fib/A flutter, obesity, gout, testicular cancer, T2DM with neuropathy, chronic pain, ESRD- on PD, recent admission 02/19/19-03/02/19 for sepsis due to peritonitis and N/V with Mallory Weiss tear and gastric ulcer.  He was reportedly discharged to home on Vanc/cefepime (? Hospice care) but was readmitted to [facility] 03/12/19-03/25/19 with weakness, diarrhea, persistent leucocytosis and problems with PD catheter  .  He was started on Vancomycin and Meropenum per ID input and recommendations to continue antibiotics thorough 12/04. PD catheter removed by general surgery, permacath placed on 11/16 and he was transitioned to HD but has had issues hypotension during HD.  Hospital course significant for leucocytosis, large volume diarrhea requiring rectal tube, deep tissue injury on sacrum and anemia requiring one unit PRBC. He was discharged to [facility] for therapy and IV antibiotics as significantly debilitated. HD ongoing TTS with midodrine for BP support.  Anemia of chronic disease managed with epo. Rectal tube removed by 11/30 but he is continent of bowel with ongoing diarrhea.has completed course of antibiotics by 12/5. He continues to have significant debility and CIR recommended for follow up therapy.     Review of Systems  Constitutional: Negative for chills and fever.  HENT: Negative for hearing loss and tinnitus.   Eyes: Negative for blurred vision and double vision.  Respiratory: Negative for cough and hemoptysis.   Cardiovascular: Positive for leg swelling. Negative for chest pain.  Gastrointestinal: Positive for diarrhea. Negative for abdominal pain, heartburn and nausea.       Incontinent of bowel since hospitalization.   Musculoskeletal: Positive for falls (x 2 in October ), joint pain  (left shoulder since fall) and myalgias.  Skin: Negative for rash.  Neurological: Positive for sensory change (bilateral hands and feet) and weakness. Negative for dizziness and headaches.  Psychiatric/Behavioral: Negative for depression and memory loss. The patient is nervous/anxious.      Past Medical History:  Diagnosis Date  . Anemia   . Anemia of renal disease 10/04/2011  . Aortic aneurysm ([place])   . Atrial fibrillation ([place])   . Atrial flutter ([place])   . AVD (aortic valve disease)   . Bilateral lower extremity edema 07/03/2012  . CAD (coronary artery disease)   . Chronic anticoagulation 07/10/2012  . CKD (chronic kidney disease)    baseline creatinine is 1.5-2  . CKD (chronic kidney disease) stage 3, GFR 30-59 ml/min 07/02/2012  . Coarctation of aorta (previous repair and stent) 04/05/2011  . Colonic ulcer 2007   felt to be secondary to NSAID  . Complex coarctation of the aorta   . Gastric polyp 2007, 06/2012   benign.  . Gout   . Gout 07/03/2012  . Hepatitis C   . History of atrial flutter s/p DCCV 04/05/2011  . HTN (hypertension)   . Hyperlipidemia   . Hypertension 04/05/2011  . Long term (current) use of anticoagulants 12/12/2013  . Obesity   . PONV (postoperative nausea and vomiting)   . Renal insufficiency   . S/P aortic valve replacement 04/05/2011  . 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 Testicular adenoma   . Thoracic aortic aneurysm ([place]) 04/05/2011    Past Surgical History:  Procedure Laterality Date  . [place]  Judes at [facility]  . AORTIC VALVE REPLACEMENT AND MITRAL VALVE REPAIR  1974   Bicuspid aortic valve  . CARDIOVERSION  02/27/2012   Procedure: CARDIOVERSION;  Surgeon: Lelon Perla, MD;  Location: [facility] ENDOSCOPY;  Service: Cardiovascular;  Laterality: N/A;  . CARDIOVERSION N/A 05/29/2013   Procedure: CARDIOVERSION;  Surgeon: Lelon Perla, MD;  Location: [facility] ENDOSCOPY;  Service: Cardiovascular;  Laterality: N/A;  .  CARDIOVERSION N/A 12/15/2013   Procedure: CARDIOVERSION;  Surgeon: Sueanne Margarita, MD;  Location: [facility] ENDOSCOPY;  Service: Cardiovascular;  Laterality: N/A;  . CARDIOVERSION N/A 08/25/2014   Procedure: CARDIOVERSION;  Surgeon: Lelon Perla, MD;  Location: [facility] ENDOSCOPY;  Service: Cardiovascular;  Laterality: N/A;  . CARDIOVERSION N/A 02/17/2015   Procedure: CARDIOVERSION;  Surgeon: Larey Dresser, MD;  Location: [place];  Service: Cardiovascular;  Laterality: N/A;  . [place]  . COARCTATION OF AORTA REPAIR  1970  . ESOPHAGOGASTRODUODENOSCOPY N/A 07/04/2012   Procedure: ESOPHAGOGASTRODUODENOSCOPY (EGD);  Surgeon: Inda Castle, MD;  Location: [place];  Service: Endoscopy;  Laterality: N/A;  . ORCHIECTOMY  1992   testicular cancer  . TONSILLECTOMY      Family History  Problem Relation Age of Onset  . Heart disease Other        No family history  . Heart attack Neg Hx     Social History:  Married. Disabled salesman. He reports that he quit smoking about 37 years ago. His smoking use included cigarettes. He started smoking about 43 years ago. He has a 1.75 pack-year smoking history. He has never used smokeless tobacco. He reports current alcohol use. He reports that he does not use drugs.    Allergies  Allergen Reactions  . Ace Inhibitors Swelling, Anxiety and Other (See Comments)    Reaction to Altace (ramipril) "Can tolerate Benicar".  Diffuse swelling Edema Elevated kidney functions      . Methoxy Polyethylene Glycol-Epoetin Beta Other (See Comments)    Not effective; low hbg made patient feel awful( lethargic)    . Ramipril Other (See Comments)    Elevated kidney functions   . Tape Rash    Prefers clear tape     Medications Prior to Admission  Medication Sig Dispense Refill  . acetaminophen (TYLENOL) 325 MG tablet Take 650 mg by mouth every 6 (six) hours as needed for fever (pain).    Marland Kitchen ALPRAZolam (XANAX) 0.5 MG tablet Take 0.5 mg  by mouth 3 (three) times daily as needed for sleep.    . Amino Acids-Protein Hydrolys (FEEDING SUPPLEMENT, PRO-STAT SUGAR FREE 64,) LIQD Take 30 mLs by mouth 3 (three) times daily with meals.    Marland Kitchen amoxicillin (AMOXIL) 500 MG capsule Take 500 mg by mouth daily as needed (1 hour prior to dental appts).     Marland Kitchen b complex-vitamin c-folic acid (NEPHRO-VITE) 0.8 MG TABS tablet Take 1 tablet by mouth daily.    . calcitRIOL (ROCALTROL) 0.25 MCG capsule Take 0.25 mcg by mouth See admin instructions. Take one capsule (0.25 mcg) by mouth on Monday, Tuesday, Thursday,  Friday at 6pm    . epoetin alfa-epbx (RETACRIT) 16109 UNIT/ML injection Inject 10,000 Units into the vein See admin instructions. Inject 10,000 units intravenously Tuesday, Thursday, Saturday after dialysis    . glucagon (GLUCAGEN) 1 MG SOLR injection Inject 1 mg into the muscle once as needed for low blood sugar.    . Guar Gum (NUTRISOURCE FIBER) PACK Take 1 packet by mouth 3 (three) times  daily.    . loperamide (IMODIUM) 2 MG capsule Take 2 mg by mouth 4 (four) times daily as needed for diarrhea or loose stools.    . metoprolol tartrate (LOPRESSOR) 25 MG tablet Take 12.5 mg by mouth 2 (two) times daily.    . midodrine (PROAMATINE) 5 MG tablet Take 5 mg by mouth 3 (three) times daily.    . ondansetron (ZOFRAN) 4 MG tablet Take 4 mg by mouth every 6 (six) hours as needed for nausea or vomiting.    Marland Kitchen oxyCODONE (OXY IR/ROXICODONE) 5 MG immediate release tablet Take 5 mg by mouth every 6 (six) hours as needed for severe pain (p).    . pantoprazole (PROTONIX) 40 MG tablet Take 40 mg by mouth 2 (two) times daily.     . pregabalin (LYRICA) 25 MG capsule Take 25 mg by mouth 2 (two) times daily.    . Probiotic Product (DIGESTIVE ADVANTAGE) CAPS Take 1 capsule by mouth daily.    . sertraline (ZOLOFT) 50 MG tablet Take 100 mg by mouth daily.    . sevelamer carbonate (RENVELA) 800 MG tablet Take 800 mg by mouth 3 (three) times daily with meals.     .  warfarin (COUMADIN) 5 MG tablet Take 5 mg by mouth daily at 6 PM.    . zolpidem (AMBIEN) 5 MG tablet Take 5 mg by mouth at bedtime as needed for sleep.    Marland Kitchen aspirin EC 81 MG tablet Take 1 tablet (81 mg total) by mouth daily. (Patient not taking: Reported on 04/16/2019) 90 tablet 3  . metoprolol succinate (TOPROL-XL) 50 MG 24 hr tablet TAKE 1 TABLET (50 MG TOTAL) BY MOUTH 2 (TWO) TIMES DAILY. TAKE WITH OR IMMEDIATELY FOLLOWING A MEAL. (Patient not taking: Reported on 04/20/2019) 180 tablet 0  . warfarin (COUMADIN) 7.5 MG tablet Take 1 tablet (7.5 mg total) by mouth daily at 6 PM. (Patient not taking: Reported on 04/10/2019) 90 tablet 3    Drug Regimen Review  Drug regimen was reviewed and remains appropriate with no significant issues identified  Home: Lives in 2 level home --uses stair lift to get from garage to main level.     Functional History: Independent with walker PTA.   Functional Status:  Mobility: Mod assist +2 to roll in bed Mod to max +2 for sliding board transfers.  able to stand with mod assist +2.      ADL: Working on [employer] with bands.    Cognition: Appears to be intact     Blood pressure (!) 119/51, pulse 82, temperature 98.1 F (36.7 C), temperature source Oral, resp. rate 20, height 6\' 2"  (1.88 m), weight (!) 144.9 kg, SpO2 93 %. Physical Exam  Nursing note and vitals reviewed. Constitutional: He is oriented to person, place, and time. He appears well-developed and well-nourished.  Anxious appearing morbidly obese male. Increase WOB with conversation. Anasarca noted.   HENT:  Mouth/Throat: Oropharynx is clear and moist.  Cardiovascular:  + metallic click.   Respiratory: No stridor. No respiratory distress.  GI: He exhibits distension. There is no abdominal tenderness.  LLQ incision C/D/I.   Musculoskeletal:        General: Edema present.     Comments: Left shoulder limited due to injury from recent fall. BLE weakness --1/5 PF on right and  2/5 PF on left.   Neurological: He is alert and oriented to person, place, and time.  Skin: Skin is warm and dry.  Multple toes with scabbed areas--due  to walker injury. Evidence of healed pressure injury on bilateral ankles from socks?  MASD with blistered areas intergluteal cleft.     Results for orders placed or performed during the hospital encounter of 03/25/19 (from the past 48 hour(s))  Protime-INR     Status: Abnormal   Collection Time: 04/06/19  5:43 AM  Result Value Ref Range   Prothrombin Time 23.4 (H) 11.4 - 15.2 seconds   INR 2.1 (H) 0.8 - 1.2    Comment: (NOTE) INR goal varies based on device and disease states. Performed at [facility], [place] [address], [place], [place] [address]   Protime-INR     Status: Abnormal   Collection Time: 04/11/2019  6:48 AM  Result Value Ref Range   Prothrombin Time 23.3 (H) 11.4 - 15.2 seconds   INR 2.1 (H) 0.8 - 1.2    Comment: (NOTE) INR goal varies based on device and disease states. Performed at [facility], [place] [address], [place], [place] [address]   SARS CORONAVIRUS 2 (TAT 6-24 HRS) Nasopharyngeal Nasopharyngeal Swab     Status: None   Collection Time: 04/23/2019  8:57 AM   Specimen: Nasopharyngeal Swab  Result Value Ref Range   SARS Coronavirus 2 NEGATIVE NEGATIVE    Comment: (NOTE) SARS-CoV-2 target nucleic acids are NOT DETECTED. The SARS-CoV-2 RNA is generally detectable in upper and lower respiratory specimens during the acute phase of infection. Negative results do not preclude SARS-CoV-2 infection, do not rule out co-infections with other pathogens, and should not be used as the sole basis for treatment or other patient management decisions. Negative results must be combined with clinical observations, patient history, and epidemiological information. The expected result is Negative. Fact Sheet for Patients: SugarRoll.be Fact Sheet for Healthcare Providers:  https://www.woods-mathews.com/ This test is not yet approved or cleared by the [place] FDA and  has been authorized for detection and/or diagnosis of SARS-CoV-2 by FDA under an Emergency Use Authorization (EUA). This EUA will remain  in effect (meaning this test can be used) for the duration of the COVID-19 declaration under Section 56 4(b)(1) of the Act, 21 U.S.C. section 360bbb-3(b)(1), unless the authorization is terminated or revoked sooner. Performed at [facility], [place] [employer] [address] [place], [place] [address]    No results found.     Medical Problem List and Plan: 1.  Impaired mobility and ADLs secondary to deibility  -patient may shower  -ELOS/Goals: modI in PT, OT, I in SLP 2.  St Jude's Aortic Valve/Antithrombotics: -DVT/anticoagulation:  Pharmaceutical: Coumadin and Heparin  -antiplatelet therapy: N/A 3. Pain Management: Oxycodone prn 4. Mood: LCSW to follow for evaluation and support.   -antipsychotic agents: N/A 5. Neuropsych: This patient is capable of making decisions on his own behalf. 6. Skin/Wound Care: Air mattress overlay for MASD due to ongoing diarrhea.  Add EPC cream to form a protective barrier from frequent loose stools.  7. Fluids/Electrolytes/Nutrition: Strict I/O. Renal diet with 1200 cc FR.  8. ESRD: Now on HD with attempts to get off 2 L. Schedule hemodialysis at the end of the day to help with therapy tolerance. Renal diet with 1200 cc FR--wrote for extra protein per patient requests. Was on PD--likely needs education regarding renal diet?   9. Hypotension: Monitor BP tid--continue midodrine. 10 History of A fib/A flutter: Monitor HR t--on coumadin. Continue Lopressor 12.5mg  BID 11. Recent GIB/Anemia of chronic disease: Continue to monitor H/H with serial checks. On Retacrit 10,000 units with HD.  12. Anxiety disorder with recent MDD: Continue Xanax prn.  13. Cough with phlegm: Guaifenesin prn   Bary Leriche, PA-C 04/12/2019    I have personally performed a face to face diagnostic evaluation, including, but not limited to relevant history and physical exam findings, of this patient and developed relevant assessment and plan.  Additionally, I have reviewed and concur with the physician assistant's documentation above.  Leeroy Cha, MD

## 2019-04-07 NOTE — Progress Notes (Signed)
Pt admitted to 4W11. Pt alert and oriented. Pt reported losing a pillow of his own. [facility] called to locate pillow. Pt has many wounds to buttocks. PA present at bedside to take pictures. Consult placed for WOC to assess these wounds. Continue plan of care.

## 2019-04-07 NOTE — PMR Pre-admission (Signed)
PMR Admission Coordinator Pre-Admission Assessment  Patient: Calvin [name] is an 63 y.o., male MRN: [id number] DOB: [birth year] Height: '6\' 2"'  (1.88 m) Weight: (!) 139.8 kg  Insurance Information HMO:     PPO:      PCP:      IPA:      80/20: yes     OTHER:  PRIMARY: Medicare A and B      Policy#: [id number]      Subscriber: Patient CM Name:       Phone#:      Fax#:  Pre-Cert#:       Employer:  Benefits:  Phone #: online     Name: verified eligibility online via [place] on 04/01/2019 Eff. Date: Part A and B effective 11/30/2014     Deduct: $1,408      Out of Pocket Max: NA      Life Max: NA CIR: Covered per medicare guidelines once yearly deductible has been met      SNF: days 1-20, 100%, days 21-100, 80% Outpatient: 80%     Co-Pay: 20% Home Health: 100%      Co-Pay: 0% DME: 80%     Co-Pay: 20% Providers: Pt's choice SECONDARY: [employer]      Policy#: [id number]      Subscriber: Patient CM Name:       Phone#:      Fax#:  Pre-Cert#:       Employer:  Benefits:  Phone #: [phone]     Name:  Eff. Date:      Deduct:       Out of Pocket Max:       Life Max:  CIR:       SNF:  Outpatient:     Co-Pay:  Home Health:       Co-Pay:  DME:      Co-Pay:   Medicaid Application Date:       Case Manager:  Disability Application Date:       Case Worker:   The "Data Collection Information Summary" for patients in Inpatient Rehabilitation Facilities with attached "Privacy Act [place] Records" was provided and verbally reviewed with: Patient  Emergency Contact Information Contact Information    Name Relation Home Work Mobile   Footman,Catherine Spouse [phone]  [phone]      Current Medical History  Patient Admitting Diagnosis: Debility after peritonitis requiring removal of PD catheter and transition to HD.   History of Present Illness: Pt is a 63 yo Male with history of atrial fibrillation/flutter, mechanical aortic valve, on chronic Coumadin, ESRD formally on PD, HTN,  anxiety, depression, colon polyps, congential heart disease, GERD, and hepatitis C. Pt was recently admitted to an outside hospital with presumed bacterial peritonitis in the setting of PD. He originally went home with hospice but ended up changing his [place] and continued on dialysis. Pt's bacterial peritonitis was suspected to be secondary to an infected peritoneal dialysis catheter. Pt underwent removal of the PD catheter on Nov 19th and was placed on vancomycin and meropenum through December 4th. The patient transitioned from PD to HD via PermCath 3x/week, currently Tuesday, Thursday, and Saturday. Pt has iron-deficiency anemia s/p transfused and continued with Epogen and has been maintained on Retacrit for anemia of chronic kidney disease. Pt required heparin drip with Coumadin bridge with follow up of PT/INR closely. Of note, pt does have deep tissue injury/unstagable wound to the left foot. He has been on Renvela for secondary hyperparathyroidism and hypotension has been treated  with midodrine and periodic use of albumin. Pt has been evaluated by therapies due to debility and overall deconditioning with recommendation for CIR. Pt is to be admitted to CIR on 04/14/2019.     Patient's medical record from [facility] has been reviewed by the rehabilitation admission coordinator and physician.  Past Medical History  Past Medical History:  Diagnosis Date  . Anemia   . Anemia of renal disease 10/04/2011  . Aortic aneurysm ([place])   . Atrial fibrillation ([place])   . Atrial flutter ([place])   . AVD (aortic valve disease)   . Bilateral lower extremity edema 07/03/2012  . CAD (coronary artery disease)   . Chronic anticoagulation 07/10/2012  . CKD (chronic kidney disease)    baseline creatinine is 1.5-2  . CKD (chronic kidney disease) stage 3, GFR 30-59 ml/min (HCC) 07/02/2012  . Coarctation of aorta (previous repair and stent) 04/05/2011  . Colonic ulcer 2007   felt to be secondary to NSAID  .  Complex coarctation of the aorta   . Gastric polyp 2007, 06/2012   benign.  . Gout   . Gout 07/03/2012  . Hepatitis C   . History of atrial flutter s/p DCCV 04/05/2011  . HTN (hypertension)   . Hyperlipidemia   . Hypertension 04/05/2011  . Long term (current) use of anticoagulants 12/12/2013  . Obesity   . PONV (postoperative nausea and vomiting)   . Renal insufficiency   . S/P aortic valve replacement 04/05/2011  . 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 Testicular adenoma   . Thoracic aortic aneurysm ([place]) 04/05/2011    Family History   family history includes Heart disease in his unknown relative.  Prior Rehab/Hospitalizations Has the patient had prior rehab or hospitalizations prior to admission? Yes  Has the patient had major surgery during 100 days prior to admission? Yes   Current Medications See MAR for details No current facility-administered medications for this encounter.   Patients Current Diet:  Diet Order            Diet regular Room service appropriate? Yes; Fluid consistency: Thin; Fluid restriction: 1500 mL Fluid  Diet effective now              Precautions / Restrictions     Has the patient had 2 or more falls or a fall with injury in the past year? Yes  Prior Activity Level Pt is a household ambulator, requiring use of rollator for mobility and close contact guard from wife. Pt required Min A for most ADLs, with Supervision for bathing, Min A for shower transfer, Mod A for toileting, Min A for dressing, and Mod I for feeding. Pt was able to ambulate up to 40 feet at home. Pt would get out of the house approx 3x/month (MD appointments) but would go out of porch daily.   Prior Functional Level Self Care: Did the patient need help bathing, dressing, using the toilet or eating? Needed some help  Indoor Mobility: Did the patient need assistance with walking from room to room (with or without device)? Needed some help  Stairs: Did the patient  need assistance with internal or external stairs (with or without device)? Dependent  Functional Cognition: Did the patient need help planning regular tasks such as shopping or remembering to take medications? Needed some help  Home Assistive Devices / Equipment    Prior Device Use: Indicate devices/aids used by the patient prior to current illness, exacerbation or injury? Rollator,  wheelchair, stair lift.   Prior Functional Level Current Functional Level  Bed Mobility  Min A Supine-sit: CGA/Min A; sit-supine: Mo A; rolling: mod/max   Transfers  Min A  Mod A +2 for sit to stand; Mod/Max A +1 for slideboard transfers   Mobility - Walk/Wheelchair  Min G Dependent   Upper Body Dressing  Min A  Mod A   Lower Body Dressing  Mod A  Dependent   Grooming  Set up/min A   Mod A   Eating/Drinking  Set up  Mod A   Toilet Transfer  Min A  Not tested   Bladder Continence   Anuric   Anuric   Bowel Management  Continent  Dependent   Stair Climbing  Dependent  Not Tested   Communication  WNL  WNL   Memory  WNL WNL   Driving  Dependent     Special needs/care consideration BiPAP/CPAP : no CPM : no Continuous Drip IV : no Dialysis : yes, new to HD        Days T/TH/Sat  Life Vest : no Oxygen : O2 at nighttime Special Bed : no Trach Size : no Wound Vac (area) : no      Location : NA Skin : unstagable pressure injury to sacrococcygeal, unstagable pressure injury to to Left heel, Left mid abdomen surgical incision with sutures in place, perianal denuded skin areas, abrasions to all toes bilaterally.             Bowel mgmt: last BM: 04/03/2019, loose Bladder mgmt: anuric   Diabetic mgmt: no Behavioral consideration : does not like to be rushed when explaining.  Chemo/radiation : history of testicular cancer requiring radiation (been > 1 yr); not current.    Previous Home Environment (from acute therapy documentation) Patient lives in a 2 story home with his wife  and two grown children (both > 25 yrs). Pt only stays on the first floor with no need to utilize 2nd story of his home. He uses a stair lift to get from the garage to the main level. He utilizes a Midwife for most mobility (per wife needs to be able to ambulation 40 feet to get to most areas of his home). He has a wc, grab bars in a new curbless shower, and an elevated toilet with risers.   Discharge Living Setting Plan is to return to his 2 story home with his wife and two grown children (both > 25 yrs). Pt will only need to stay on the first floor with no need to utilize 2nd story of his home. He uses a stair lift to get from the garage to the main level. He utilizes a Midwife for most mobility (per wife will only need to be able to ambulation 40 feet to get to most areas of his home). He has a wc, grab bars in a new curbless shower, and an elevated toilet with risers.  The patient does not have any problems obtaining their medications.   Social/Family/Support Systems Pt has a very supportive wife Lyan Moyano: [phone]) and grown children that have assisted him at baseline. Pt's wife is able to provide up to Max A, and she has experience with body mechanics (she is a power lifter and appears to be very involved in patient's care and wanting to know how best to utilize his strength and body mechanics to reduce burden on the family). Wife is available 24/7. DC plan has been discussed with primary caregiver (  wife). She is in agreement with the plan. There are no issues with lodging or transportation while the patient is in rehab.   Goals/Additional Needs    Patient/family goals for rehab:  PT: Min G OT: Min A SLP: NA ELOS: 14-18 days Cultural considerations: Christian Dietary Needs: heart healthy, 2 g sodium, thin liquid diet with 1500 mL fluid restriction per day Special Service Needs: new to HD, has been clipped to [facility] T/TH/Sat 11:30.  Pt/Family Agrees  to Admission and willing to participate: yes Program orientaiton provided and reviewed with pt/caregiver including roles and responsibilities: Yes, pt and wife  Barriers to discharge: Medical stability; new to HD.      Decrease burden of Care through IP rehab admission: NA  Possible need for SNF placement upon discharge: Not anticipated. Pt and his wife are committed to returning home after IP Rehab stay and they are willing to obtain equipment and even add hired caregiver assist as needed for a safe return home. Anticipate pt will be able to return home near baseline after CIR stay.   Patient Condition: I have reviewed medical records from [facility], spoken with CM, and patient and spouse. I met with patient at the bedside for inpatient rehabilitation assessment.  Patient will benefit from ongoing PT and OT, can actively participate in 3 hours of therapy a day 5 days of the week, and can make measurable gains during the admission.  Patient will also benefit from the coordinated team approach during an Inpatient Acute Rehabilitation admission.  The patient will receive intensive therapy as well as Rehabilitation physician, nursing, social worker, and care management interventions.  Due to bladder management, bowel management, safety, skin/wound care, disease management, medication administration, pain management and patient education the patient requires 24 hour a day rehabilitation nursing.  The patient is currently Mod A +2 sit to stand, Mod/Max A +1 slideboard transfers, no gain yet and Mod to Dependent for basic ADLs.  Discharge setting and therapy post discharge at home with home health is anticipated.  Patient has agreed to participate in the Acute Inpatient Rehabilitation Program and will admit 04/22/2019.  Preadmission Screen Completed By:  Izora Ribas, 04/06/2019 12:11 PM ______________________________________________________________________   Discussed status with Dr.  Ranell Patrick on 04/13/2019 at 11:23AM and received approval for admission today.  Admission Coordinator:  Izora Ribas, MD, time 11:23AM/Date 04/04/2019   Assessment/Plan: Diagnosis: Impaired mobility and ADLs secondary to debility 1. Does the need for close, 24 hr/day Medical supervision in concert with the patient's rehab needs make it unreasonable for this patient to be served in a less intensive setting? Yes 2. Co-Morbidities requiring supervision/potential complications: anemia of renal disease, atrial fibrillation, seminoma, morbid obesity, s/p aortic valve replacement, CKF stage 3 3. Due to safety, skin/wound care, disease management, medication administration, pain management and patient education, does the patient require 24 hr/day rehab nursing? Yes 4. Does the patient require coordinated care of a physician, rehab nurse, PT and OT to address physical and functional deficits in the context of the above medical diagnosis(es)? Yes Addressing deficits in the following areas: balance, endurance, locomotion, strength, transferring, bowel/bladder control, bathing, dressing, feeding, grooming, toileting and psychosocial support 5. Can the patient actively participate in an intensive therapy program of at least 3 hrs of therapy 5 days a week? Yes 6. The potential for patient to make measurable gains while on inpatient rehab is excellent 7. Anticipated functional outcomes upon discharge from inpatient rehab: modified independent PT, modified independent  OT, independent SLP 8. Estimated rehab length of stay to reach the above functional goals is: 2-3 weeks 9. Anticipated discharge destination: Home 10. Overall Rehab/Functional Prognosis: excellent  MD Signature Leeroy Cha, MD

## 2019-04-08 ENCOUNTER — Inpatient Hospital Stay (HOSPITAL_COMMUNITY): Payer: Medicare Other | Admitting: Physical Therapy

## 2019-04-08 ENCOUNTER — Inpatient Hospital Stay (HOSPITAL_COMMUNITY): Payer: Medicare Other | Admitting: Occupational Therapy

## 2019-04-08 ENCOUNTER — Inpatient Hospital Stay (HOSPITAL_COMMUNITY): Payer: Medicare Other

## 2019-04-08 DIAGNOSIS — N186 End stage renal disease: Secondary | ICD-10-CM

## 2019-04-08 DIAGNOSIS — F411 Generalized anxiety disorder: Secondary | ICD-10-CM | POA: Diagnosis not present

## 2019-04-08 DIAGNOSIS — Z992 Dependence on renal dialysis: Secondary | ICD-10-CM

## 2019-04-08 DIAGNOSIS — R5381 Other malaise: Secondary | ICD-10-CM | POA: Diagnosis not present

## 2019-04-08 DIAGNOSIS — R195 Other fecal abnormalities: Secondary | ICD-10-CM

## 2019-04-08 LAB — BASIC METABOLIC PANEL
Anion gap: 13 (ref 5–15)
BUN: 43 mg/dL — ABNORMAL HIGH (ref 8–23)
CO2: 26 mmol/L (ref 22–32)
Calcium: 7.9 mg/dL — ABNORMAL LOW (ref 8.9–10.3)
Chloride: 96 mmol/L — ABNORMAL LOW (ref 98–111)
Creatinine, Ser: 5.36 mg/dL — ABNORMAL HIGH (ref 0.61–1.24)
GFR calc Af Amer: 12 mL/min — ABNORMAL LOW (ref >60)
GFR calc non Af Amer: 11 mL/min — ABNORMAL LOW (ref >60)
Glucose, Bld: 106 mg/dL — ABNORMAL HIGH (ref 70–99)
Potassium: 3.6 mmol/L (ref 3.5–5.1)
Sodium: 135 mmol/L (ref 135–145)

## 2019-04-08 LAB — CBC
HCT: 25.5 % — ABNORMAL LOW (ref 39.0–52.0)
Hemoglobin: 7.6 g/dL — ABNORMAL LOW (ref 13.0–17.0)
MCH: 28.5 pg (ref 26.0–34.0)
MCHC: 29.8 g/dL — ABNORMAL LOW (ref 30.0–36.0)
MCV: 95.5 fL (ref 80.0–100.0)
Platelets: 540 10*3/uL — ABNORMAL HIGH (ref 150–400)
RBC: 2.67 MIL/uL — ABNORMAL LOW (ref 4.22–5.81)
RDW: 17.1 % — ABNORMAL HIGH (ref 11.5–15.5)
WBC: 10.4 10*3/uL (ref 4.0–10.5)
nRBC: 0 % (ref 0.0–0.2)

## 2019-04-08 LAB — PROTIME-INR
INR: 2.2 — ABNORMAL HIGH (ref 0.8–1.2)
Prothrombin Time: 24 seconds — ABNORMAL HIGH (ref 11.4–15.2)

## 2019-04-08 LAB — C DIFFICILE QUICK SCREEN W PCR REFLEX
C Diff antigen: NEGATIVE
C Diff interpretation: NOT DETECTED
C Diff toxin: NEGATIVE

## 2019-04-08 MED ORDER — ZINC OXIDE 40 % EX OINT
TOPICAL_OINTMENT | Freq: Three times a day (TID) | CUTANEOUS | Status: DC
  Administered 2019-04-08 – 2019-04-12 (×7): via TOPICAL
  Administered 2019-04-12: 1 via TOPICAL
  Administered 2019-04-13 – 2019-04-14 (×5): via TOPICAL
  Administered 2019-04-15: 1 via TOPICAL
  Filled 2019-04-08 (×3): qty 57

## 2019-04-08 MED ORDER — HEPARIN SODIUM (PORCINE) 1000 UNIT/ML DIALYSIS: 2000.0000 [IU] | INTRAMUSCULAR | Status: DC | PRN

## 2019-04-08 MED ORDER — WARFARIN SODIUM 3 MG PO TABS
6.0000 mg | ORAL_TABLET | Freq: Once | ORAL | Status: AC
  Administered 2019-04-08: 22:00:00 6 mg via ORAL
  Filled 2019-04-08: qty 2

## 2019-04-08 MED ORDER — SACCHAROMYCES BOULARDII 250 MG PO CAPS
500.0000 mg | ORAL_CAPSULE | Freq: Two times a day (BID) | ORAL | Status: DC
  Administered 2019-04-08 – 2019-04-14 (×12): 500 mg via ORAL
  Filled 2019-04-08 (×12): qty 2

## 2019-04-08 MED ORDER — CHLORHEXIDINE GLUCONATE CLOTH 2 % EX PADS: 6.0000 | MEDICATED_PAD | Freq: Every day | CUTANEOUS | Status: DC

## 2019-04-08 MED ORDER — CHLORHEXIDINE GLUCONATE CLOTH 2 % EX PADS
6.0000 | MEDICATED_PAD | Freq: Every day | CUTANEOUS | Status: DC
  Administered 2019-04-08 – 2019-04-10 (×3): 6 via TOPICAL

## 2019-04-08 MED ORDER — HEPARIN SODIUM (PORCINE) 1000 UNIT/ML IJ SOLN
INTRAMUSCULAR | Status: AC
  Filled 2019-04-08: qty 5

## 2019-04-08 MED ORDER — ALPRAZOLAM 0.5 MG PO TABS
0.5000 mg | ORAL_TABLET | Freq: Three times a day (TID) | ORAL | Status: DC
  Administered 2019-04-08 – 2019-04-10 (×6): 0.5 mg via ORAL
  Filled 2019-04-08 (×6): qty 1

## 2019-04-08 NOTE — Progress Notes (Signed)
[facility] PHYSICAL MEDICINE & REHABILITATION PROGRESS NOTE   Subjective/Complaints: Had a rough night due to anxiety about today, poor sleep, ongoing stooling.  Feels short of breath during conversation and minimal movement   Objective:   No results found. Recent Labs    04/08/19 0620  WBC 10.4  HGB 7.6*  HCT 25.5*  PLT 540*   No results for input(s): NA, K, CL, CO2, GLUCOSE, BUN, CREATININE, CALCIUM in the last 72 hours.  Intake/Output Summary (Last 24 hours) at 04/08/2019 0950 Last data filed at 04/08/2019 4034 Gross per 24 hour  Intake 240 ml  Output -  Net 240 ml     Physical Exam: Vital Signs Blood pressure (!) 111/45, pulse 81, temperature 98.3 F (36.8 C), temperature source Oral, resp. rate 17, height 6\' 2"  (1.88 m), weight (!) 144.9 kg, SpO2 95 %. Constitutional: No distress . Vital signs reviewed. HEENT: EOMI, oral membranes moist Neck: supple Cardiovascular: IRR with click. No JVD    Respiratory: clear without wheezes, short shallow breaths though. Dyspneic with convo GI: BS +, sl distended. Abdominal wounds healing/dry  Musculoskeletal:        General: Edema present LE.     Comments: Left shoulder limited due to injury from recent fall. BLE weakness 2/5 proximally (effort?).-1/5 PF on right and 2/5 PF on left.   Neurological: He is alert and oriented to person, place, and time.  Skin: Skin is warm and dry.  Multple toes with scabbed areas--due to walker injury. Large left heel wound with eschar, no drainage. Macerated, blistered areas in gluteal folds Psych: pleasant but appears very anxious   Assessment/Plan: 1. Functional deficits secondary to debility which require 3+ hours per day of interdisciplinary therapy in a comprehensive inpatient rehab setting.  Physiatrist is providing close team supervision and 24 hour management of active medical problems listed below.  Physiatrist and rehab team continue to assess barriers to discharge/monitor patient  progress toward functional and medical goals  Care Tool:  Bathing              Bathing assist       Upper Body Dressing/Undressing Upper body dressing   What is the patient wearing?: Hospital gown only    Upper body assist      Lower Body Dressing/Undressing Lower body dressing      What is the patient wearing?: Hospital gown only     Lower body assist       Toileting Toileting    Toileting assist Assist for toileting: 2 Helpers     Transfers Chair/bed transfer  Transfers assist     Chair/bed transfer assist level: 2 Helpers     Locomotion Ambulation   Ambulation assist              Walk 10 feet activity   Assist           Walk 50 feet activity   Assist           Walk 150 feet activity   Assist           Walk 10 feet on uneven surface  activity   Assist           Wheelchair     Assist               Wheelchair 50 feet with 2 turns activity    Assist            Wheelchair 150 feet activity  Assist          Blood pressure (!) 111/45, pulse 81, temperature 98.3 F (36.8 C), temperature source Oral, resp. rate 17, height 6\' 2"  (1.88 m), weight (!) 144.9 kg, SpO2 95 %.  Medical Problem List and Plan: 1.  Impaired mobility and ADLs secondary to deibility             -patient may shower             -ELOS/Goals: modI in PT, OT, I in SLP 2.  St Jude's Aortic Valve/Antithrombotics: -DVT/anticoagulation:  Pharmaceutical: Coumadin (INR 2.2)             -antiplatelet therapy: N/A 3. Pain Management: Oxycodone prn 4. Mood: LCSW to follow for evaluation and support.              -antipsychotic agents: N/A  -anxiety disorder/ recent MDD: Will begin scheduled xanax tid   -discussed relaxation and breathing techniques with pt   -will ask neuropsych to see 5. Neuropsych: This patient is capable of making decisions on his own behalf. 6. Skin/Wound Care: Air mattress overlay for MASD due to  ongoing diarrhea.  Added EPC cream to form a protective barrier from frequent loose stools.   -prevalon boots bilateral heels 7. Fluids/Electrolytes/Nutrition: Strict I/O. Renal diet with 1200 cc FR.  8. ESRD: Now on HD with attempts to get off 2 L. Schedule hemodialysis at the end of the day to help with therapy tolerance.   -Renal diet with 1200 cc FR-- extra protein per patient requests.     -dietary ed 9. Hypotension: Monitor BP tid--continue midodrine. 10 History of A fib/A flutter: Monitor HR t--on coumadin. Continue Lopressor 12.5mg  BID 11. Recent GIB/Anemia of chronic disease: Continue to monitor H/H with serial checks. On Retacrit 10,000 units with HD.  12. Loose stool: C diff negative  -continue fiber  -increase probiotic to 500mg   Bid  -diet  -prn lomotil 13. Cough with phlegm: Guaifenesin prn     LOS: 1 days A FACE TO FACE EVALUATION WAS PERFORMED  Meredith Staggers 04/08/2019, 9:50 AM

## 2019-04-08 NOTE — Progress Notes (Signed)
Pt slept 3-4 hours through out the night. Pt had 2 incontinent BM. Pt appeared very agitated with all staff members. All needs were met and pt had no complaints at this time.   

## 2019-04-08 NOTE — Progress Notes (Addendum)
Initial Nutrition Assessment  DOCUMENTATION CODES:   Obesity unspecified  INTERVENTION:  -Continue Pro-Stat po BID, each supplement provides 100 kcal and 15 grams of protein.  -Wife bringing in home Liquacel protein modular, continue due patient preference  -Continue double portions  -Patient requesting regular diet. If electrolytes remain WDL and unable to meet pt food preferences, then recommend liberalizing to regular diet.    NUTRITION DIAGNOSIS:   Increased nutrient needs related to acute illness, chronic illness(peritonitis) as evidenced by estimated needs.  GOAL:   Patient will meet greater than or equal to 90% of their needs  MONITOR:   PO intake, Diet advancement, Supplement acceptance, Labs, Weight trends  REASON FOR ASSESSMENT:   Other (Comment)(ESRD on PD)    ASSESSMENT:   63 year old patient with recent admission 10/21-11/1 for sepsis due to peritonitis. Pt was readmitted to [facility] 11/11-11/24 for weakness, diarrhea and issues with PD catheter. PD cath was removed and pt transitioned to HD on Tuesday, Thursday and Saturday. PMH of congenital heart disease, HTN, GERD, anxiety, depression and colon polyps. Pt admitted to CIR for significant debility.   Patient was in bed with wife Barnetta Chapel at bedside. Patient requested diet be changed to regular. Pt stated his lab values have been stable and requested multiple times to be moved to a regular diet so as to get more protein. At home, pt typically has eggs and bacon for breakfast, a sub for lunch and baked fish with vegetable for dinner. Eats snack of peaches and pears. Wife reports pt does not finish meals. Wife reports giving pt Liquacel protein each morning. Reported she does not measure but counts her pour and thinks it's about 3 oz (51 g PRO).   Pt is receiving PRO-STAT 65ml BID and double protein at meals. Pt reports eating <50% at meals, recorded PO intake 75-100% of meals. Pt did not eat lunch of  [place] sandwich before leaving for dialysis.   Pt reports usual body wt is 330# and believes he has lost wt over the previous 2 months due to diarrhea from antibiotics. Current wt is 319#. Unsure of actual dry weight. This represents a 3.5% wt loss which is not significant.  Medications: Calcitriol 0.25 mcg, rena-vit, renvela 800mg , warfarin 6mg   Labs: Phosphorus 4.9, potassium 4.0, calcium 8.7, corrected calcium 10.5 (H)  NUTRITION - FOCUSED PHYSICAL EXAM:    Most Recent Value  Orbital Region  No depletion  Upper Arm Region  No depletion  Thoracic and Lumbar Region  No depletion  Buccal Region  No depletion  Temple Region  Mild depletion  Clavicle Bone Region  No depletion  Clavicle and Acromion Bone Region  No depletion  Scapular Bone Region  Mild depletion  Dorsal Hand  Mild depletion  Patellar Region  No depletion  Anterior Thigh Region  No depletion  Posterior Calf Region  No depletion  Edema (RD Assessment)  Mild  Hair  Unable to assess  Eyes  Unable to assess  Mouth  Unable to assess  Skin  Unable to assess  Nails  Reviewed       Diet Order:   Diet Order            Diet renal with fluid restriction Fluid restriction: 1200 mL Fluid; Room service appropriate? Yes; Fluid consistency: Thin  Diet effective now              EDUCATION NEEDS:   Not appropriate for education at this time  Skin:  Skin  Assessment: Reviewed RN Assessment  Last BM:  12/7  Height:   Ht Readings from Last 1 Encounters:  04/14/2019 6\' 2"  (1.88 m)    Weight:   Wt Readings from Last 1 Encounters:  04/08/19 (!) 137.5 kg    Ideal Body Weight:  86.4 kg  BMI:  Body mass index is 38.92 kg/m.  Estimated Nutritional Needs:   Kcal:  2600-2800  Protein:  150-175  Fluid:  1024ml + [place] Dietetic Intern Pager # [phone]

## 2019-04-08 NOTE — Progress Notes (Signed)
Pt continue to request oxygen via [place]. Pt stated it helps with his anxiety. Provider will be notified, and order will be obtain per provider judgment

## 2019-04-08 NOTE — Consult Note (Addendum)
[facility] Nurse Consult Note: Reason for Consult: Consult requested for bilat buttocks/gluteal fold.  Performed remotely after review of photos and progress notes in the EMR.   Wound type: Location and appearance are NOT consistent with pressure injuries.  Moisture associated skin damage with multiple patchy areas of full thickness wounds which are red and moist.  It will be difficult to promote healing or keep the area from becoming soiled if patient remains incontinent.   Dressing procedure/placement/frequency: Pt is unable to use Gerhart's butt cream due to a noted sensitivity to nystatin.  Topical treament orders provided for bedside nurses to apply Desitin TID and PRN to protect the location, repel moisture, and promote healing. Please re-consult if further assistance is needed.  Thank-you,  Julien Girt MSN, [place], [place], [place], [place]

## 2019-04-08 NOTE — Patient Care Conference (Signed)
Inpatient RehabilitationTeam Conference and Plan of Care Update Date: 04/08/2019   Time: 10:05 AM    Patient Name: Calvin [name]      Medical Record Number: [id number]  Date of Birth: 1955/08/05 Sex: Male         Room/Bed: 4W11C/4W11C-01 Payor Info: Payor: MEDICARE / Plan: MEDICARE PART A AND B / Product Type: *No Product type* /    Admit Date/Time:  04/11/2019  2:47 PM  Primary Diagnosis:  <principal problem not specified>  Patient Active Problem List   Diagnosis Date Noted  . Physical debility 04/10/2019  . Complex coarctation of the aorta   . Hepatitis C   . AVD (aortic valve disease)   . Long term (current) use of anticoagulants 12/12/2013  . Chronic anticoagulation 07/10/2012  . Gastric ulcer 07/04/2012  . Gout 07/03/2012  . Bilateral lower extremity edema 07/03/2012  . CKD (chronic kidney disease) stage 3, GFR 30-59 ml/min 07/02/2012  . Knee pain 12/03/2011  . Anemia of renal disease 10/04/2011  . Hyperlipidemia 04/23/2011  . 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 S/P aortic valve replacement 04/05/2011  . Thoracic aortic aneurysm ([place]) 04/05/2011  . Atrial fibrillation ([place]) 04/05/2011  . History of atrial flutter s/p DCCV 04/05/2011  . Coarctation of aorta (previous repair and stent) 04/05/2011  . Hypertension 04/05/2011  . CAD (coronary artery disease) 04/05/2011  . 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 Anemia, iron deficiency 04/03/2011    Expected Discharge Date: Expected Discharge Date: (Estimated LOS 2-3 weeks)  Team Members Present: Physician leading conference: Dr. Alger Simons Social Worker Present: Lennart Pall, LCSW Nurse Present: Dorien Chihuahua, RN Case manager: Karene Fry, RN PT Present: Burnard Bunting, PT OT Present: Willeen Cass, OT SLP Present: Weston Anna, SLP PPS Coordinator present : Gunnar Fusi, SLP     Current Status/Progress Goal Weekly Team Focus  Bowel/Bladder   pt is incontinent x2,LBM 04/15/2019  Pt will regain contienence  Q2hour toileting/PRN    Swallow/Nutrition/ Hydration             ADL's   max-total A for ADLs bed level, very anxious.Max A bed mobility  min-mod A overall  OOB tolerance/transfers, ADL retraining, BUE strengthening   Mobility   max assist R/L rolling, refused OOB activity at eval 2/2 fatigue/anxiety  min-mod assist from w/c level, short distance gait w/ therapy only  OOB tolerance, endurance, anxiety management, global strengthening   Communication             Safety/Cognition/ Behavioral Observations            Pain   pt denies pain at this time  Pt will remain pain free  Assess pain Qshift/PRN   Skin   Pt has significant breakdown to entire bottom- WOC consult in place, Scab like sores on the toes., MASD to groin  Prevent further breakdown and free of infection  assess skin qshift/prn    Rehab Goals Patient on target to meet rehab goals: Yes *See Care Plan and progress notes for long and short-term goals.     Barriers to Discharge  Current Status/Progress Possible Resolutions Date Resolved   Nursing                  PT  Medical stability;Wound Care;Behavior  high anxiety, multiple wounds at gluteal fold              OT Hemodialysis;Incontinence                SLP  SW                Discharge Planning/Teaching Needs:  Home with family/ wife to provide 24/7 assistance.  Teaching needs TBD   Team Discussion: ESRD/PD, developed peritonitis, changed to HD, heart issues, weakness, anxiety, need neuropsych eval, diarrhea, did not sleep well, pressure injury L heel.  RN - inc B/B, to HD today, breakdown on bottom.  OT min/mod goals, currently max A.  PT min/mod goals w/c level.  Placed on 15/7 therapies.  Wife assisting up to max A, was a power lifter?   Revisions to Treatment Plan: N/A     Medical Summary Current Status: debility, pd to hd for peritonitis. anxiety and depression, wound care issue Weekly Focus/Goal: multiple medical issues being managed as per progress  Barriers  to Discharge: Behavior;Medical stability       Continued Need for Acute Rehabilitation Level of Care: The patient requires daily medical management by a physician with specialized training in physical medicine and rehabilitation for the following reasons: Direction of a multidisciplinary physical rehabilitation program to maximize functional independence : Yes Medical management of patient stability for increased activity during participation in an intensive rehabilitation regime.: Yes Analysis of laboratory values and/or radiology reports with any subsequent need for medication adjustment and/or medical intervention. : Yes   I attest that I was present, lead the team conference, and concur with the assessment and plan of the team.   Retta Diones 04/08/2019, 8:29 PM  Team conference was held via web/ teleconference due to [place] - 19

## 2019-04-08 NOTE — Procedures (Signed)
   I was present at this dialysis session, have reviewed the session itself and made  appropriate changes [name] MD [place] pager [phone]   04/08/2019, 4:32 PM

## 2019-04-08 NOTE — Progress Notes (Signed)
Pictures taken yesterday afternoon past admission. Patient has MASD due to bowel incontinence and ongoing diarrhea. Only wants to use wet wipes--does not like wash cloths. Areas need to be cleansed then dried. Appreciate WOC input. Has been off antibiotics since Saturday and as stools bulk up anticipate that areas will dry and heal up. Air mattress ordered which patient has refused. Continue to encourage side lying for pressure relief measures.

## 2019-04-08 NOTE — Progress Notes (Signed)
Inpatient Rehabilitation  Patient information reviewed and entered into eRehab system by Carma Dwiggins M. Letesha Klecker, M.A., CCC/SLP, PPS Coordinator.  Information including medical coding, functional ability and quality indicators will be reviewed and updated through discharge.    

## 2019-04-08 NOTE — Progress Notes (Signed)
Occupational Therapy Session Note  Patient Details  Name: Calvin [name] MRN: [id number] Date of Birth: 1956-04-11  Today's Date: 04/08/2019 OT Individual Time: 1030-1102 OT Individual Time Calculation (min): 32 min  28 missed minutes secondary to fatigue and increased HR  Short Term Goals: Week 1:  OT Short Term Goal 1 (Week 1): Pt will don UB clothing EOB with mod A OT Short Term Goal 2 (Week 1): Pt will transfer to EOB with mod A OT Short Term Goal 3 (Week 1): Pt will reduce anxiety during OT, evidenced by maintaining HR under 130 bpm during bed mobility  Skilled Therapeutic Interventions/Progress Updates:  Upon entering the room, pt supine in bed with RN present in the room assisting with hygiene from bowel incontinence. OT assisting pt with positioning at bed level with total A needed. Pt rolling L <> R with max A. Pt's HR at supine was 130-170 bpm unsure if this is due to anxiety or deconditioning. Pt verbalized feeling very fatigued from schedule today and as therapist was discussing possible changes or solutions to schedule he feel asleep. OT unable to alert pt for purposeful intervention and therefore allowed to rest. PA notifies of HR concerns. Bed alarm activated and call bell within reach upon exiting the room.    Therapy Documentation Precautions:  Precautions Precautions: Fall Precaution Comments: monitor HR, L heel wound Restrictions Weight Bearing Restrictions: No General: General Chart Reviewed: Yes OT Amount of Missed Time: 28 Minutes PT Missed Treatment Reason: Patient fatigue Family/Caregiver Present: No Pain: Pain Assessment Pain Scale: 0-10 Pain Score: 0-No pain ADL: ADL Eating: Supervision/safety Where Assessed-Eating: Bed level Grooming: Supervision/safety Where Assessed-Grooming: Bed level Upper Body Bathing: Moderate assistance Where Assessed-Upper Body Bathing: Bed level Lower Body Bathing: Dependent Where Assessed-Lower Body Bathing: Bed  level Upper Body Dressing: Maximal assistance Where Assessed-Upper Body Dressing: Edge of bed Lower Body Dressing: Dependent Where Assessed-Lower Body Dressing: Bed level Toileting: Dependent Where Assessed-Toileting: Bed level Toilet Transfer: Unable to assess Vision Baseline Vision/History: Wears glasses Wears Glasses: At all times Patient Visual Report: No change from baseline Vision Assessment?: No apparent visual deficits Perception  Perception: Within Functional Limits Praxis Praxis: Intact   Therapy/Group: Individual Therapy  Gypsy Decant 04/08/2019, 11:14 AM

## 2019-04-08 NOTE — Evaluation (Signed)
Occupational Therapy Assessment and Plan  Patient Details  Name: Calvin [name] MRN: [id number] Date of Birth: February 20, 1956  OT Diagnosis: acute pain and muscle weakness (generalized) Rehab Potential: Rehab Potential (ACUTE ONLY): Fair ELOS: 2-3 weeks   Today's Date: 04/08/2019 OT Individual Time: 5784-6962 OT Individual Time Calculation (min): 70 min     Problem List:  Patient Active Problem List   Diagnosis Date Noted  . Physical debility 04/27/2019  . Complex coarctation of the aorta   . Hepatitis C   . AVD (aortic valve disease)   . Long term (current) use of anticoagulants 12/12/2013  . Chronic anticoagulation 07/10/2012  . Gastric ulcer 07/04/2012  . Gout 07/03/2012  . Bilateral lower extremity edema 07/03/2012  . CKD (chronic kidney disease) stage 3, GFR 30-59 ml/min 07/02/2012  . Knee pain 12/03/2011  . Anemia of renal disease 10/04/2011  . Hyperlipidemia 04/23/2011  . 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 S/P aortic valve replacement 04/05/2011  . Thoracic aortic aneurysm ([place]) 04/05/2011  . Atrial fibrillation ([place]) 04/05/2011  . History of atrial flutter s/p DCCV 04/05/2011  . Coarctation of aorta (previous repair and stent) 04/05/2011  . Hypertension 04/05/2011  . CAD (coronary artery disease) 04/05/2011  . 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 Anemia, iron deficiency 04/03/2011    Past Medical History:  Past Medical History:  Diagnosis Date  . Anemia   . Anemia of renal disease 10/04/2011  . Aortic aneurysm ([place])   . Atrial fibrillation ([place])   . Atrial flutter ([place])   . AVD (aortic valve disease)   . Bilateral lower extremity edema 07/03/2012  . CAD (coronary artery disease)   . Chronic anticoagulation 07/10/2012  . CKD (chronic kidney disease)    baseline creatinine is 1.5-2  . CKD (chronic kidney disease) stage 3, GFR 30-59 ml/min 07/02/2012  . Coarctation of aorta (previous repair and stent) 04/05/2011  . Colonic ulcer 2007   felt to be secondary to NSAID  . Complex  coarctation of the aorta   . Gastric polyp 2007, 06/2012   benign.  . Gout   . Gout 07/03/2012  . Hepatitis C   . History of atrial flutter s/p DCCV 04/05/2011  . HTN (hypertension)   . Hyperlipidemia   . Hypertension 04/05/2011  . Long term (current) use of anticoagulants 12/12/2013  . Obesity   . PONV (postoperative nausea and vomiting)   . Renal insufficiency   . S/P aortic valve replacement 04/05/2011  . 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 Testicular adenoma   . Thoracic aortic aneurysm ([place]) 04/05/2011   Past Surgical History:  Past Surgical History:  Procedure Laterality Date  . AORTIC VALVE REPLACEMENT  1995   [facility]  . AORTIC VALVE REPLACEMENT AND MITRAL VALVE REPAIR  1974   Bicuspid aortic valve  . CARDIOVERSION  02/27/2012   Procedure: CARDIOVERSION;  Surgeon: Lelon Perla, MD;  Location: [facility] ENDOSCOPY;  Service: Cardiovascular;  Laterality: N/A;  . CARDIOVERSION N/A 05/29/2013   Procedure: CARDIOVERSION;  Surgeon: Lelon Perla, MD;  Location: [facility] ENDOSCOPY;  Service: Cardiovascular;  Laterality: N/A;  . CARDIOVERSION N/A 12/15/2013   Procedure: CARDIOVERSION;  Surgeon: Sueanne Margarita, MD;  Location: [facility] ENDOSCOPY;  Service: Cardiovascular;  Laterality: N/A;  . CARDIOVERSION N/A 08/25/2014   Procedure: CARDIOVERSION;  Surgeon: Lelon Perla, MD;  Location: [facility] ENDOSCOPY;  Service: Cardiovascular;  Laterality: N/A;  . CARDIOVERSION N/A 02/17/2015   Procedure: CARDIOVERSION;  Surgeon: Larey Dresser,  MD;  Location: [place];  Service: Cardiovascular;  Laterality: N/A;  . [place]  . COARCTATION OF AORTA REPAIR  1970  . ESOPHAGOGASTRODUODENOSCOPY N/A 07/04/2012   Procedure: ESOPHAGOGASTRODUODENOSCOPY (EGD);  Surgeon: Inda Castle, MD;  Location: [place];  Service: Endoscopy;  Laterality: N/A;  . ORCHIECTOMY  1992   testicular cancer  . TONSILLECTOMY      Assessment & Plan Clinical Impression: Calvin [name]  is a 63 year old male with history of CAD s/p repair with stent of coarctation of aorta, [employer] AVR, A fib/A flutter, obesity, gout, testicular cancer, T2DM with neuropathy, chronic pain, ESRD- on PD, recent admission 02/19/19-03/02/19 for sepsis due to peritonitis and N/V with Mallory Weiss tear and gastric ulcer.  He was reportedly discharged to home on Vanc/cefepime (? Hospice care) but was readmitted to [facility] 03/12/19-03/25/19 with weakness, diarrhea, persistent leucocytosis and problems with PD catheter  .  He was started on Vancomycin and Meropenum per ID input and recommendations to continue antibiotics thorough 12/04. PD catheter removed by general surgery, permacath placed on 11/16 and he was transitioned to HD but has had issues hypotension during HD.  Hospital course significant for leucocytosis, large volume diarrhea requiring rectal tube, deep tissue injury on sacrum and anemia requiring one unit PRBC. He was discharged to [facility] for therapy and IV antibiotics as significantly debilitated. HD ongoing TTS with midodrine for BP support.  Anemia of chronic disease managed with epo. Rectal tube removed by 11/30 but he is continent of bowel with ongoing diarrhea.has completed course of antibiotics by 12/5. He continues to have significant debility and CIR recommended for follow up therapy.    Patient transferred to CIR on 04/15/2019 .    Patient currently requires max with basic self-care skills secondary to muscle weakness, decreased cardiorespiratoy endurance and decreased sitting balance, decreased standing balance, decreased postural control and decreased balance strategies.  Prior to hospitalization, patient could complete ADLs with min.  Patient will benefit from skilled intervention to decrease level of assist with basic self-care skills prior to discharge home with care partner.  Anticipate patient will require moderate physical assestance and follow up home health.  OT - End of  Session Activity Tolerance: Tolerates < 10 min activity with changes in vital signs Endurance Deficit: Yes Endurance Deficit Description: deconditioned OT Assessment Rehab Potential (ACUTE ONLY): Fair OT Barriers to Discharge: Hemodialysis;Incontinence OT Patient demonstrates impairments in the following area(s): Balance;Behavior;Endurance;Motor;Safety;Sensory;Skin Integrity OT Basic ADL's Functional Problem(s): Bathing;Dressing;Toileting OT Transfers Functional Problem(s): Toilet OT Additional Impairment(s): None OT Plan OT Intensity: Minimum of 1-2 x/day, 45 to 90 minutes OT Frequency: 5 out of 7 days OT Duration/Estimated Length of Stay: 2-3 weeks OT Treatment/Interventions: Balance/vestibular training;DME/adaptive equipment instruction;Therapeutic Activities;Patient/family education;Wheelchair propulsion/positioning;Therapeutic Exercise;Psychosocial support;Community reintegration;Functional mobility training;Self Care/advanced ADL retraining;UE/LE Strength taining/ROM;UE/LE Coordination activities;Skin care/wound managment;Discharge planning;Disease mangement/prevention;Pain management OT Self Feeding Anticipated Outcome(s): no goal set OT Basic Self-Care Anticipated Outcome(s): min A OT Toileting Anticipated Outcome(s): mod A OT Bathroom Transfers Anticipated Outcome(s): mod A OT Recommendation Recommendations for Other Services: Neuropsych consult Patient destination: Home Follow Up Recommendations: Home health OT Equipment Recommended: To be determined   Skilled Therapeutic Intervention Skilled OT evaluation completed. Pt edu on ELOS, OT POC, goals, and rehab expectations. Pt very anxious throughout session, as evidenced by labored breathing and tachycardia (up to 150 bpm) with any mention of bed or OOB mobility. Pt completed ADLs at bed level as described below. Pt required frequent rest break  throughout session. Cueing provided for breathing techniques throughout. Pt requested  O2 support and was given nasal cannula but no O2 turned on. Pt reported relief with this "use of O2" (placebo). Pt completed bed mobility with max A for rolling and for sidelying to sitting EOB. Pt left supine with all needs met, bed alarm set.   OT Evaluation Precautions/Restrictions  Precautions Precautions: Other (comment);Fall Precaution Comments: monitor HR, L heel wound Restrictions Weight Bearing Restrictions: No General Chart Reviewed: Yes Family/Caregiver Present: No Pain Pain Assessment Pain Scale: 0-10 Pain Score: 0-No pain Home Living/Prior Functioning Home Living Family/patient expects to be discharged to:: Private residence Living Arrangements: Spouse/significant other, Children Available Help at Discharge: Family, Available 24 hours/day Type of Home: House Home Access: Other (comment)(chair lift at baseline) Home Layout: Two level, Full bath on main level, Able to live on main level with bedroom/bathroom Alternate Level Stairs-Rails: None Bathroom Shower/Tub: Other (comment)(curbless) Bathroom Toilet: Handicapped height Bathroom Accessibility: Yes  Lives With: Spouse, Son, Daughter IADL History Homemaking Responsibilities: No Current License: No Prior Function Level of Independence: Needs assistance with gait, Needs assistance with tranfers, Needs assistance with ADLs(min-mod A overall) Vocation: Unemployed ADL ADL Eating: Supervision/safety Where Assessed-Eating: Bed level Grooming: Supervision/safety Where Assessed-Grooming: Bed level Upper Body Bathing: Moderate assistance Where Assessed-Upper Body Bathing: Bed level Lower Body Bathing: Dependent Where Assessed-Lower Body Bathing: Bed level Upper Body Dressing: Maximal assistance Where Assessed-Upper Body Dressing: Edge of bed Lower Body Dressing: Dependent Where Assessed-Lower Body Dressing: Bed level Toileting: Dependent Where Assessed-Toileting: Bed level Toilet Transfer: Unable to  assess Vision Baseline Vision/History: No visual deficits Patient Visual Report: No change from baseline Vision Assessment?: No apparent visual deficits Perception  Perception: Within Functional Limits Praxis Praxis: Intact Cognition Overall Cognitive Status: Within Functional Limits for tasks assessed Arousal/Alertness: Awake/alert Orientation Level: Person;Place;Situation Person: Oriented Place: Oriented Situation: Oriented Year: 2020 Month: December Day of Week: Incorrect(Monday) Memory: Appears intact Immediate Memory Recall: Sock;Blue;Bed Memory Recall Sock: Without Cue Memory Recall Blue: Without Cue Memory Recall Bed: Without Cue Attention: Sustained Sustained Attention: Appears intact Awareness: Appears intact Problem Solving: Appears intact Safety/Judgment: Appears intact Comments: Pt very anxious but overall cognition WFL Sensation Sensation Light Touch: Impaired Detail Central sensation comments: Reports hx of peripheral neuropathy in B hands and feet Light Touch Impaired Details: Impaired RUE;Impaired LUE;Impaired RLE;Impaired LLE Coordination Gross Motor Movements are Fluid and Coordinated: No Fine Motor Movements are Fluid and Coordinated: No Coordination and Movement Description: generalized weakness/debility Motor  Motor Motor: Other (comment) Motor - Skilled Clinical Observations: generalized deconditioning Mobility  Bed Mobility Bed Mobility: Rolling Right;Rolling Left;Right Sidelying to Sit Rolling Right: Maximal Assistance - Patient 25-49% Rolling Left: Maximal Assistance - Patient 25-49% Right Sidelying to Sit: Maximal Assistance - Patient 25-49%  Trunk/Postural Assessment  Cervical Assessment Cervical Assessment: Exceptions to WFL(forward head) Thoracic Assessment Thoracic Assessment: Exceptions to WFL(rounded shoulders, kyphotic posture) Lumbar Assessment Lumbar Assessment: Exceptions to WFL(posterior pelvic tilt) Postural  Control Postural Control: Deficits on evaluation Righting Reactions: delayed  Balance Balance Balance Assessed: Yes Static Sitting Balance Static Sitting - Balance Support: Bilateral upper extremity supported Static Sitting - Level of Assistance: 5: Stand by assistance Dynamic Sitting Balance Dynamic Sitting - Balance Support: Bilateral upper extremity supported Dynamic Sitting - Level of Assistance: 4: Min assist Extremity/Trunk Assessment RUE Assessment RUE Assessment: Exceptions to [facility] General Strength Comments: generalized weakness, full AROM, 3+/5 LUE Assessment LUE Assessment: Exceptions to [facility] General Strength Comments: generalized weakness, full AROM, 3+/5     Refer to  Care Plan for Long Term Goals  Recommendations for other services: Neuropsych   Discharge Criteria: Patient will be discharged from OT if patient refuses treatment 3 consecutive times without medical reason, if treatment goals not met, if there is a change in medical status, if patient makes no progress towards goals or if patient is discharged from hospital.  The above assessment, treatment plan, treatment alternatives and goals were discussed and mutually agreed upon: by patient  Curtis Sites 04/08/2019, 8:42 AM

## 2019-04-08 NOTE — Progress Notes (Signed)
Orthopedic Tech Progress Note Patient Details:  Calvin [name] June 17, 1955 [id number]  Patient ID: Calvin [name], male   DOB: Nov 16, 1955, 63 y.o.   MRN: [id number]   Calvin [name] 04/08/2019, 10:03 AMOrtho don't have Prevalon boots. You can order form [employer] # [id number].

## 2019-04-08 NOTE — Evaluation (Signed)
Physical Therapy Assessment and Plan  Patient Details  Name: Calvin [name] MRN: [id number] Date of Birth: May 02, 1955  PT Diagnosis: Difficulty walking, Impaired sensation and Muscle weakness Rehab Potential: Good ELOS: 2-3 weeks   Today's Date: 04/08/2019 PT Individual Time: 0900-0930 AND 1140-1205 PT Individual Time Calculation (min): 30 min  AND 25 min   Problem List:  Patient Active Problem List   Diagnosis Date Noted  . Physical debility 04/01/2019  . Complex coarctation of the aorta   . Hepatitis C   . AVD (aortic valve disease)   . Long term (current) use of anticoagulants 12/12/2013  . Chronic anticoagulation 07/10/2012  . Gastric ulcer 07/04/2012  . Gout 07/03/2012  . Bilateral lower extremity edema 07/03/2012  . CKD (chronic kidney disease) stage 3, GFR 30-59 ml/min 07/02/2012  . Knee pain 12/03/2011  . Anemia of renal disease 10/04/2011  . Hyperlipidemia 04/23/2011  . 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 S/P aortic valve replacement 04/05/2011  . Thoracic aortic aneurysm ([place]) 04/05/2011  . Atrial fibrillation ([place]) 04/05/2011  . History of atrial flutter s/p DCCV 04/05/2011  . Coarctation of aorta (previous repair and stent) 04/05/2011  . Hypertension 04/05/2011  . CAD (coronary artery disease) 04/05/2011  . Seminoma ([place]) 04/03/2011  . Anemia, iron deficiency 04/03/2011    Past Medical History:  Past Medical History:  Diagnosis Date  . Anemia   . Anemia of renal disease 10/04/2011  . Aortic aneurysm ([place])   . Atrial fibrillation ([place])   . Atrial flutter ([place])   . AVD (aortic valve disease)   . Bilateral lower extremity edema 07/03/2012  . CAD (coronary artery disease)   . Chronic anticoagulation 07/10/2012  . CKD (chronic kidney disease)    baseline creatinine is 1.5-2  . CKD (chronic kidney disease) stage 3, GFR 30-59 ml/min 07/02/2012  . Coarctation of aorta (previous repair and stent) 04/05/2011  . Colonic ulcer 2007   felt to be secondary to NSAID  .  Complex coarctation of the aorta   . Gastric polyp 2007, 06/2012   benign.  . Gout   . Gout 07/03/2012  . Hepatitis C   . History of atrial flutter s/p DCCV 04/05/2011  . HTN (hypertension)   . Hyperlipidemia   . Hypertension 04/05/2011  . Long term (current) use of anticoagulants 12/12/2013  . Obesity   . PONV (postoperative nausea and vomiting)   . Renal insufficiency   . S/P aortic valve replacement 04/05/2011  . 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 Testicular adenoma   . Thoracic aortic aneurysm ([place]) 04/05/2011   Past Surgical History:  Past Surgical History:  Procedure Laterality Date  . AORTIC VALVE REPLACEMENT  1995   [facility]  . AORTIC VALVE REPLACEMENT AND MITRAL VALVE REPAIR  1974   Bicuspid aortic valve  . CARDIOVERSION  02/27/2012   Procedure: CARDIOVERSION;  Surgeon: Lelon Perla, MD;  Location: [facility] ENDOSCOPY;  Service: Cardiovascular;  Laterality: N/A;  . CARDIOVERSION N/A 05/29/2013   Procedure: CARDIOVERSION;  Surgeon: Lelon Perla, MD;  Location: [facility] ENDOSCOPY;  Service: Cardiovascular;  Laterality: N/A;  . CARDIOVERSION N/A 12/15/2013   Procedure: CARDIOVERSION;  Surgeon: Sueanne Margarita, MD;  Location: [facility] ENDOSCOPY;  Service: Cardiovascular;  Laterality: N/A;  . CARDIOVERSION N/A 08/25/2014   Procedure: CARDIOVERSION;  Surgeon: Lelon Perla, MD;  Location[facility] ENDOSCOPY;  Service: Cardiovascular;  Laterality: N/A;  . CARDIOVERSION N/A 02/17/2015   Procedure: CARDIOVERSION;  Surgeon: Elby Showers  Aundra Dubin, MD;  Location: [place];  Service: Cardiovascular;  Laterality: N/A;  . [place]  . COARCTATION OF AORTA REPAIR  1970  . ESOPHAGOGASTRODUODENOSCOPY N/A 07/04/2012   Procedure: ESOPHAGOGASTRODUODENOSCOPY (EGD);  Surgeon: Inda Castle, MD;  Location: [place];  Service: Endoscopy;  Laterality: N/A;  . ORCHIECTOMY  1992   testicular cancer  . TONSILLECTOMY      Assessment & Plan Clinical Impression:  Patient is a 63 yo Male with history of atrial fibrillation/flutter, mechanical aortic valve, on chronic Coumadin, ESRD formally on PD, HTN, anxiety, depression, colon polyps, congential heart disease, GERD, and hepatitis C. Pt was recently admitted to an outside hospital with presumed bacterial peritonitis in the setting of PD. He originally went home with hospice but ended up changing his [place] and continued on dialysis. Pt's bacterial peritonitis was suspected to be secondary to an infected peritoneal dialysis catheter. Pt underwent removal of the PD catheter on Nov 19th and was placed on vancomycin and meropenum through December 4th. The patient transitioned from PD to HD via PermCath 3x/week, currently Tuesday, Thursday, and Saturday. Pt has iron-deficiency anemia s/p transfused and continued with Epogen and has been maintained on Retacrit for anemia of chronic kidney disease. Pt required heparin drip with Coumadin bridge with follow up of PT/INR closely. Of note, pt does have deep tissue injury/unstagable wound to the left foot. He has been on Renvela for secondary hyperparathyroidism and hypotension has been treated with midodrine and periodic use of albumin. Pt has been evaluated by therapies due to debility and overall deconditioning with recommendation for CIR. Patient transferred to CIR on 05/01/2019 .   Patient currently requires total with mobility secondary to muscle weakness and muscle joint tightness and decreased cardiorespiratoy endurance.  Prior to hospitalization, patient was min with mobility and lived with Spouse, Family in a House home.  Home access is  Stairs to enter, Other (comment)(chair lift to go from garage to main floor).  Patient will benefit from skilled PT intervention to maximize safe functional mobility, minimize fall risk and decrease caregiver burden for planned discharge home with 24 hour assist.  Anticipate patient will benefit from follow up [facility] at discharge.  PT - End of  Session Activity Tolerance: Tolerates < 10 min activity, no significant change in vital signs Endurance Deficit: Yes Endurance Deficit Description: decreased, unable to tolerate hour-long session PT Assessment Rehab Potential (ACUTE/IP ONLY): Good PT Barriers to Discharge: Medical stability;Wound Care;Behavior PT Barriers to Discharge Comments: high anxiety, multiple wounds at gluteal fold PT Patient demonstrates impairments in the following area(s): Behavior;Endurance;Motor;Sensory;Skin Integrity;Balance PT Transfers Functional Problem(s): Bed Mobility;Floor;Bed to Chair;Car;Furniture PT Locomotion Functional Problem(s): Ambulation;Wheelchair Mobility PT Plan PT Intensity: Minimum of 1-2 x/day ,45 to 90 minutes PT Frequency: Total of 15 hours over 7 days of combined therapies PT Duration Estimated Length of Stay: 2-3 weeks PT Treatment/Interventions: Ambulation/gait training;Discharge planning;DME/adaptive equipment instruction;Functional mobility training;Pain management;Psychosocial support;Splinting/orthotics;Therapeutic Activities;UE/LE Strength taining/ROM;Wheelchair propulsion/positioning;UE/LE Coordination activities;Therapeutic Exercise;Skin care/wound management;Patient/family education;Neuromuscular re-education;Functional electrical stimulation;Disease management/prevention;Community reintegration;Balance/vestibular training PT Transfers Anticipated Outcome(s): min assist PT Locomotion Anticipated Outcome(s): supervision w/c mobility, mod assist short distance gait PT Recommendation Recommendations for Other Services: Neuropsych consult Follow Up Recommendations: Home health PT Patient destination: Home Equipment Recommended: To be determined  Skilled Therapeutic Intervention  Session 1:  Pt in supine and agreeable to therapy, denies pain but reports increased fatigue. Pt also w/ increased work of breathing, expressed a lot of anxiety about participating in rehab. Pt declining  OOB  activity 2/2 anxiety and fatigue, but would then state "well we need to do what we need to do". Pt also very sleepy, frequently closing eyes. Therapist assessing BLE ROM and strength when pt reported he needed his brief changed. R/L rolling w/ max assist while therapist performed pericare and brief management. Pt actively having BM w/ liquid stool. Needed multiple wet wipes and wash cloths. Pt also w/ skin breakdown within gluteal fold. Dry wash cloths patted on skin just to remove moisture prior to donning new brief. Discussed w/ pt having nursing staff only use dry wash cloths to pat him dry, otherwise use wet wipes that wife brought from home. Educated pt on resting in sidelying for pressure relief and pt agreeable. Assisted w/ rolling to R sidelying and propped w/ pillows. Pt resting comfortably and all needs in reach.   Session 2:  Wife present for 2nd session and educated pt and wife in results of PT evaluation as detailed below, PT POC, rehab potential, rehab goals, and discharge recommendations. Extensively discussed home set-up and anticipated d/c level of function. Wife reporting that pt's current w/c will not fit into 32" door ways. Discussed potentially getting new (more narrow) w/c but this may have to be out-of-pocket 2/2 patient having gotten a w/c recently - made SW aware. Wife w/ questions about walking at home and other DME. Discussed that he will likely be primarily w/c level, but we will continue to work on initiating gait and standing. Pt's biggest barrier to d/c home would be getting into car and getting to/from dialysis. Pt and wife in agreement w/ this plan and verbalized understanding of all education.   PT Evaluation Precautions/Restrictions Precautions Precautions: Fall Precaution Comments: monitor HR, L heel wound Restrictions Weight Bearing Restrictions: No Pain Pain Assessment Pain Scale: 0-10 Pain Score: 0-No pain Home Living/Prior Functioning Home  Living Available Help at Discharge: Family;Available 24 hours/day Type of Home: House Home Access: Stairs to enter;Other (comment)(chair lift to go from garage to main floor) Home Layout: Able to live on main level with bedroom/bathroom;Two level Alternate Level Stairs-Rails: None Bathroom Shower/Tub: Other (comment)(curbless) Bathroom Toilet: Handicapped height Bathroom Accessibility: Yes  Lives With: Spouse;Family Prior Function Level of Independence: Needs assistance with ADLs;Needs assistance with homemaking;Needs assistance with gait;Needs assistance with tranfers(needed min assist overall for ADLs and gait around house w/ rollator)  Able to Take Stairs?: No Driving: No Vocation: On disability Comments: Wife providing min assist at baseline Vision/Perception  Perception Perception: Within Functional Limits Praxis Praxis: Intact  Cognition Overall Cognitive Status: Within Functional Limits for tasks assessed Arousal/Alertness: Awake/alert(sleepy but alert) Orientation Level: Oriented X4 Attention: Sustained Sustained Attention: Appears intact Memory: Appears intact Immediate Memory Recall: Sock;Blue;Bed Memory Recall Sock: Without Cue Memory Recall Blue: Without Cue Memory Recall Bed: Without Cue Awareness: Appears intact Problem Solving: Appears intact Safety/Judgment: Appears intact Comments: high anxiety Sensation Sensation Light Touch: Impaired by gross assessment Central sensation comments: Reports hx of peripheral neuropathy in B hands and feet Peripheral sensation comments: 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, diminished sensation in B feet Light Touch Impaired Details: Impaired RUE;Impaired LUE;Impaired RLE;Impaired LLE Coordination Gross Motor Movements are Fluid and Coordinated: Yes Fine Motor Movements are Fluid and Coordinated: No Coordination and Movement Description: generalized weakness/debility Motor  Motor Motor: Other  (comment) Motor - Skilled Clinical Observations: generalized weakness and deconditioning  Mobility Bed Mobility Bed Mobility: Rolling Right;Rolling Left Rolling Right: Maximal Assistance - Patient 25-49% Rolling Left: Maximal Assistance - Patient 25-49% Right Sidelying to  Sit: Maximal Assistance - Patient 25-49% Transfers Transfers: (pt refused) Locomotion  Gait Ambulation: No Gait Gait: No Stairs / Additional Locomotion Stairs: No Wheelchair Mobility Wheelchair Mobility: No  Trunk/Postural Assessment  Cervical Assessment Cervical Assessment: Exceptions to WFL(forward head) Thoracic Assessment Thoracic Assessment: Exceptions to WFL(rounded shoulders, kyphotic posture) Lumbar Assessment Lumbar Assessment: Exceptions to WFL(posterior pelvic tilt) Postural Control Postural Control: Deficits on evaluation Righting Reactions: delayed  Balance Balance Balance Assessed: Yes Static Sitting Balance Static Sitting - Balance Support: Bilateral upper extremity supported Static Sitting - Level of Assistance: 5: Stand by assistance Dynamic Sitting Balance Dynamic Sitting - Balance Support: Bilateral upper extremity supported Dynamic Sitting - Level of Assistance: 4: Min assist Extremity Assessment  RUE Assessment RUE Assessment: Exceptions to [facility] General Strength Comments: generalized weakness, full AROM, 3+/5 LUE Assessment LUE Assessment: Exceptions to [facility] General Strength Comments: generalized weakness, full AROM, 3+/5 RLE Assessment RLE Assessment: Exceptions to [facility] Passive Range of Motion (PROM) Comments: very tight hamstring and gastroc, able to reach end range w/ stretching General Strength Comments: Globally 2+ to 3-/5 LLE Assessment LLE Assessment: Exceptions to [facility] Passive Range of Motion (PROM) Comments: very tight hamstring and gastroc, able to reach end range w/ stretching General Strength Comments: Globally 2+ to 3-/5    Refer to Care Plan for Long Term  Goals  Recommendations for other services: Neuropsych  Discharge Criteria: Patient will be discharged from PT if patient refuses treatment 3 consecutive times without medical reason, if treatment goals not met, if there is a change in medical status, if patient makes no progress towards goals or if patient is discharged from hospital.  The above assessment, treatment plan, treatment alternatives and goals were discussed and mutually agreed upon: by patient  Jamond Neels K Dannielle Baskins 04/08/2019, 10:01 AM

## 2019-04-08 NOTE — Progress Notes (Signed)
ANTICOAGULATION CONSULT NOTE - Follow Up Consult  Pharmacy Consult for Coumadin Indication: [facility] AVR + Afib  Allergies  Allergen Reactions  . Ace Inhibitors Swelling, Anxiety and Other (See Comments)    Reaction to Altace (ramipril) "Can tolerate Benicar".  Diffuse swelling Edema Elevated kidney functions      . Methoxy Polyethylene Glycol-Epoetin Beta Other (See Comments)    Not effective; low hbg made patient feel awful( lethargic)    . Ramipril Other (See Comments)    Elevated kidney functions   . Tape Rash    Prefers clear tape     Patient Measurements: Height: 6\' 2"  (188 cm) Weight: (!) 319 lb 7.1 oz (144.9 kg) IBW/kg (Calculated) : 82.2  Vital Signs: Temp: 98.3 F (36.8 C) (12/08 0500) Temp Source: Oral (12/08 0500) BP: 111/45 (12/08 0500) Pulse Rate: 81 (12/08 0500)  Labs: Recent Labs    04/06/19 0543 04/24/2019 0648 04/08/19 0620  HGB  --   --  7.6*  HCT  --   --  25.5*  PLT  --   --  540*  LABPROT 23.4* 23.3* 24.0*  INR 2.1* 2.1* 2.2*    Estimated Creatinine Clearance: 18.1 mL/min (A) (by C-G formula based on SCr of 6.43 mg/dL (H)).  Assessment:  Anticoag: Warfarin PTA for hx [facility] Jude AVR + Afib. Range should be 2.5-3.5 given both of these however per discussion w/ patient he was told to aim for 2.5 mid 2019 by MD - will aim for 2.5-3 in CIR. Pt noted to have recent GIB. Hgb 7.6 lower today. - PTA warfarin dose: 5mg  daily  Goal of Therapy:  INR 2.5-3 Monitor platelets by anticoagulation protocol: Yes   Plan:  - Warfarin 6 mg x 1 - F/u PT/INR - Aim for 2.5-3 w/ recent GIB - MD messaged about adding PPI.   Latorie Montesano S. Alford Highland, PharmD, BCPS Clinical Staff Pharmacist Eilene Ghazi Stillinger 04/08/2019,11:01 AM

## 2019-04-08 NOTE — Consult Note (Signed)
Renal Service Consult Note [facility]  Calvin [name] 04/08/2019 Sol Bl[place] Requesting Physician:  Dr Naaman Plummer  Reason for Consult:  ESRD pt admitted to [facility] from [facility] HPI: The patient is a 62 y.o. year-old w/ hx CAD, sp repair coarctation aorta, sp AVR, afib/ flutter, gout, testicular cancer, DM2 w/ comp, chronic pain and ESRD on PD. Pt admitted recently Oct 21- Mar 02, 2019 for sepsis due to peritonitis.  [place] home on vanc / cefepime then readmitted to [facility] 11/11 - 03/25/19 w/ persistent peritonitis and required PD cath removal on 11/16 and TDC placed same date.  Had sig diarrhea issues , hypotension on HD, sacral decub also. Midodrine added for HD support.  Rectal tube dc'd 11/30 but still having ongoing diarrhea.  Was transferred from [facility] to [facility] on 04/23/2019. Asked to see for ESRD.    Pt states was on PD starting in 2016 and recently transitioned to HD as noted above on 03/17/19.  Pt is poor historian due to severe fatigue at the moment, but is Ox3 when awake.  deneis any CP or abd pain.    ROS  denies CP  no joint pain   no HA  no blurry vision  no rash  no nausea/ vomiting     Past Medical History  Past Medical History:  Diagnosis Date  . Anemia   . Anemia of renal disease 10/04/2011  . Aortic aneurysm ([place])   . Atrial fibrillation ([place])   . Atrial flutter ([place])   . AVD (aortic valve disease)   . Bilateral lower extremity edema 07/03/2012  . CAD (coronary artery disease)   . Chronic anticoagulation 07/10/2012  . CKD (chronic kidney disease)    baseline creatinine is 1.5-2  . CKD (chronic kidney disease) stage 3, GFR 30-59 ml/min 07/02/2012  . Coarctation of aorta (previous repair and stent) 04/05/2011  . Colonic ulcer 2007   felt to be secondary to NSAID  . Complex coarctation of the aorta   . Gastric polyp 2007, 06/2012   benign.  . Gout   . Gout 07/03/2012  . Hepatitis C   . History of atrial flutter s/p DCCV 04/05/2011  . HTN  (hypertension)   . Hyperlipidemia   . Hypertension 04/05/2011  . Long term (current) use of anticoagulants 12/12/2013  . Obesity   . PONV (postoperative nausea and vomiting)   . Renal insufficiency   . S/P aortic valve replacement 04/05/2011  . 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 Testicular adenoma   . Thoracic aortic aneurysm ([place]) 04/05/2011   Past Surgical History  Past Surgical History:  Procedure Laterality Date  . AORTIC VALVE REPLACEMENT  1995   [facility]  . AORTIC VALVE REPLACEMENT AND MITRAL VALVE REPAIR  1974   Bicuspid aortic valve  . CARDIOVERSION  02/27/2012   Procedure: CARDIOVERSION;  Surgeon: Lelon Perla, MD;  Location: [facility] ENDOSCOPY;  Service: Cardiovascular;  Laterality: N/A;  . CARDIOVERSION N/A 05/29/2013   Procedure: CARDIOVERSION;  Surgeon: Lelon Perla, MD;  Location: [facility] ENDOSCOPY;  Service: Cardiovascular;  Laterality: N/A;  . CARDIOVERSION N/A 12/15/2013   Procedure: CARDIOVERSION;  Surgeon: Sueanne Margarita, MD;  Location: [facility] ENDOSCOPY;  Service: Cardiovascular;  Laterality: N/A;  . CARDIOVERSION N/A 08/25/2014   Procedure: CARDIOVERSION;  Surgeon: Lelon Perla, MD;  Location: [facility] ENDOSCOPY;  Service: Cardiovascular;  Laterality: N/A;  . CARDIOVERSION N/A 02/17/2015   Procedure: CARDIOVERSION;  Surgeon: Elby Showers  Aundra Dubin, MD;  Location: [place];  Service: Cardiovascular;  Laterality: N/A;  . [place]  . COARCTATION OF AORTA REPAIR  1970  . ESOPHAGOGASTRODUODENOSCOPY N/A 07/04/2012   Procedure: ESOPHAGOGASTRODUODENOSCOPY (EGD);  Surgeon: Inda Castle, MD;  Location: [place];  Service: Endoscopy;  Laterality: N/A;  . ORCHIECTOMY  1992   testicular cancer  . TONSILLECTOMY     Family History  Family History  Problem Relation Age of Onset  . Heart disease Other        No family history  . Heart attack Neg Hx    Social History  reports that he quit smoking about 37 years ago. His smoking use  included cigarettes. He started smoking about 43 years ago. He has a 1.75 pack-year smoking history. He has never used smokeless tobacco. He reports current alcohol use. He reports that he does not use drugs. Allergies  Allergies  Allergen Reactions  . Ace Inhibitors Swelling, Anxiety and Other (See Comments)    Reaction to Altace (ramipril) "Can tolerate Benicar".  Diffuse swelling Edema Elevated kidney functions      . Methoxy Polyethylene Glycol-Epoetin Beta Other (See Comments)    Not effective; low hbg made patient feel awful( lethargic)    . Ramipril Other (See Comments)    Elevated kidney functions   . Tape Rash    Prefers clear tape    Home medications Prior to Admission medications   Medication Sig Start Date End Date Taking? Authorizing Provider  acetaminophen (TYLENOL) 325 MG tablet Take 650 mg by mouth every 6 (six) hours as needed for fever (pain).   Yes [provider]  ALPRAZolam Duanne Moron) 0.5 MG tablet Take 0.5 mg by mouth 3 (three) times daily as needed for sleep.   Yes [provider]  Amino Acids-Protein Hydrolys (FEEDING SUPPLEMENT, PRO-STAT SUGAR FREE 64,) LIQD Take 30 mLs by mouth 3 (three) times daily with meals.   Yes [provider]  amoxicillin (AMOXIL) 500 MG capsule Take 500 mg by mouth daily as needed (1 hour prior to dental appts).  09/13/11  Yes [provider]  b complex-vitamin c-folic acid (NEPHRO-VITE) 0.8 MG TABS tablet Take 1 tablet by mouth daily.   Yes [provider]  calcitRIOL (ROCALTROL) 0.25 MCG capsule Take 0.25 mcg by mouth See admin instructions. Take one capsule (0.25 mcg) by mouth on Monday, Tuesday, Thursday,  Friday at 6pm   Yes [provider]  epoetin alfa-epbx (RETACRIT) 37106 UNIT/ML injection Inject 10,000 Units into the vein See admin instructions. Inject 10,000 units intravenously Tuesday, Thursday, Saturday after dialysis   Yes [provider]  glucagon (GLUCAGEN) 1  MG SOLR injection Inject 1 mg into the muscle once as needed for low blood sugar.   Yes [provider]  Guar Gum (NUTRISOURCE FIBER) PACK Take 1 packet by mouth 3 (three) times daily.   Yes [provider]  loperamide (IMODIUM) 2 MG capsule Take 2 mg by mouth 4 (four) times daily as needed for diarrhea or loose stools.   Yes [provider]  metoprolol tartrate (LOPRESSOR) 25 MG tablet Take 12.5 mg by mouth 2 (two) times daily.   Yes [provider]  midodrine (PROAMATINE) 5 MG tablet Take 5 mg by mouth 3 (three) times daily.   Yes [provider]  ondansetron (ZOFRAN) 4 MG tablet Take 4 mg by mouth every 6 (six) hours as needed for nausea or vomiting.   Yes [provider]  oxyCODONE (OXY IR/ROXICODONE) 5 MG immediate release tablet Take 5 mg by mouth every 6 (six) hours as needed for severe pain (p).   Yes [provider]  pantoprazole (PROTONIX) 40 MG tablet Take 40 mg by mouth 2 (two) times daily.    Yes [provider]  pregabalin (LYRICA) 25 MG capsule Take 25 mg by mouth 2 (two) times daily.   Yes [provider]  Probiotic Product (DIGESTIVE ADVANTAGE) CAPS Take 1 capsule by mouth daily.   Yes [provider]  sertraline (ZOLOFT) 50 MG tablet Take 100 mg by mouth daily.   Yes [provider]  sevelamer carbonate (RENVELA) 800 MG tablet Take 800 mg by mouth 3 (three) times daily with meals.    Yes [provider]  warfarin (COUMADIN) 5 MG tablet Take 5 mg by mouth daily at 6 PM.   Yes [provider]  zolpidem (AMBIEN) 5 MG tablet Take 5 mg by mouth at bedtime as needed for sleep.   Yes [provider]  aspirin EC 81 MG tablet Take 1 tablet (81 mg total) by mouth daily. Patient not taking: Reported on 04/17/2019 12/26/17   Lelon Perla, MD  metoprolol succinate (TOPROL-XL) 50 MG 24 hr tablet TAKE 1 TABLET (50 MG TOTAL) BY MOUTH 2 (TWO) TIMES DAILY. TAKE WITH OR  IMMEDIATELY FOLLOWING A MEAL. Patient not taking: Reported on 04/21/2019 12/30/18   Lelon Perla, MD  warfarin (COUMADIN) 7.5 MG tablet Take 1 tablet (7.5 mg total) by mouth daily at 6 PM. Patient not taking: Reported on 04/20/2019 08/09/16   Lelon Perla, MD   Liver Function Tests Recent Labs  Lab 04/02/19 0544 04/03/19 0626 04/05/19 0517  ALBUMIN 1.7* 1.7* 1.8*   No results for input(s): LIPASE, AMYLASE in the last 168 hours. CBC Recent Labs  Lab 04/03/19 0626 04/05/19 0517 04/08/19 0620  WBC 9.4 9.9 10.4  HGB 8.5* 8.0* 7.6*  HCT 28.4* 27.3* 25.5*  MCV 97.9 98.6 95.5  PLT 447* 501* 440*   Basic Metabolic Panel Recent Labs  Lab 04/02/19 0544 04/03/19 0626 04/05/19 0517  NA 138 137 138  K 3.7 3.8 4.0  CL 98 97* 100  CO2 27 27 24   GLUCOSE 94 99 91  BUN 25* 42* 55*  CREATININE 4.25* 5.88* 6.43*  CALCIUM 8.8* 8.6* 8.7*  PHOS 3.5 4.3 4.9*   Iron/TIBC/Ferritin/ %Sat    Component Value Date/Time   IRON 17 (L) 08/28/2014 1019   TIBC 218 08/28/2014 1019   FERRITIN 1,581 (H) 08/28/2014 1019   IRONPCTSAT 8 (L) 08/28/2014 1019   IRONPCTSAT 19 (L) 10/23/2012 1416    Vitals:   04/28/2019 1520 04/15/2019 1935 05/01/2019 2056 04/08/19 0500  BP: (!) 119/51 (!) 114/47 (!) 106/46 (!) 111/45  Pulse: 82 83 81 81  Resp: 20 17  17   Temp: 98.1 F (36.7 C) 98.4 F (36.9 C)  98.3 F (36.8 C)  TempSrc: Oral Oral  Oral  SpO2: 93% 94%  95%  Weight: (!) 144.9 kg     Height: 6\' 2"  (1.88 m)       Exam Gen groggy, Ox 3, very mild myoclonic jerking  No rash, cyanosis or gangrene Sclera anicteric, throat clear  No jvd or bruits Chest clear bilat to bases RRR +pv click, soft sem, no gallop Abd soft ntnd no mass or ascites +bs obese w/ 2+ abd wall edema GU normal male MS no joint effusions or deformity Ext 2-3+ dependent hip edema, no wounds  or ulcers Neuro is alert, Ox 3 , gen weakness RIJ TDC    Home meds:  - metoprolol 12.5 bid or 50 bid  - warfarin 7.5 qd  -  aspirin 81 qd  - sertraline 100/ oxy IR prn/ alprazolam 0.5 tid prn/ pregabaline 25 bid  - midodrine 5 tid  - pantoprazole 40 bid   - prn's/ vitamins/ supplements      Assessment/ Plan: 1. ESRD - patient was on PD 2016 until recent peritonitis infection and PD cath was removed on 03/17/19, now has TDC on HD, TTS schedule on [facility]. Pt now moved to CIR for rehab. Pt on coumadin w/ INR > 2.  No hep w/ HD. Will plan HD today.  2. Somnolence - may be just low on sleep, will check meds. Having some asterixis, will dc Lyrica.  3. Vol overload - anasarca , lungs are clear, cont midodrine 10 tid and lower HD BP thresholds as pt will tolerate to get vol off.  4. Afib - on metop + coumadin 5. MBD ckd - cont renvela 6. Anemia ckd - Hb 7.5 - 8.5 range. Will check w/ pharm for any esa dosing at [facility].  7. Debility - on CIR    [name]  MD 04/08/2019, 10:08 AM

## 2019-04-09 ENCOUNTER — Inpatient Hospital Stay (HOSPITAL_COMMUNITY): Payer: Medicare Other | Admitting: Occupational Therapy

## 2019-04-09 ENCOUNTER — Inpatient Hospital Stay (HOSPITAL_COMMUNITY): Payer: Medicare Other

## 2019-04-09 ENCOUNTER — Encounter (HOSPITAL_COMMUNITY): Payer: Medicare Other | Admitting: Psychology

## 2019-04-09 DIAGNOSIS — R0602 Shortness of breath: Secondary | ICD-10-CM

## 2019-04-09 DIAGNOSIS — R5381 Other malaise: Secondary | ICD-10-CM | POA: Diagnosis not present

## 2019-04-09 LAB — PREPARE RBC (CROSSMATCH)

## 2019-04-09 LAB — PROTIME-INR
INR: 2 — ABNORMAL HIGH (ref 0.8–1.2)
Prothrombin Time: 22.7 seconds — ABNORMAL HIGH (ref 11.4–15.2)

## 2019-04-09 LAB — IRON AND TIBC
Iron: 25 ug/dL — ABNORMAL LOW (ref 45–182)
Saturation Ratios: 16 % — ABNORMAL LOW (ref 17.9–39.5)
TIBC: 160 ug/dL — ABNORMAL LOW (ref 250–450)
UIBC: 135 ug/dL

## 2019-04-09 LAB — FERRITIN: Ferritin: 1384 ng/mL — ABNORMAL HIGH (ref 24–336)

## 2019-04-09 MED ORDER — SODIUM CHLORIDE 0.9 % IV SOLN
125.0000 mg | INTRAVENOUS | Status: AC
  Administered 2019-04-10 – 2019-04-17 (×3): 125 mg via INTRAVENOUS
  Filled 2019-04-09 (×8): qty 10

## 2019-04-09 MED ORDER — WARFARIN SODIUM 3 MG PO TABS
6.0000 mg | ORAL_TABLET | Freq: Once | ORAL | Status: AC
  Administered 2019-04-09: 6 mg via ORAL
  Filled 2019-04-09: qty 2

## 2019-04-09 MED ORDER — PANTOPRAZOLE SODIUM 40 MG PO TBEC
40.0000 mg | DELAYED_RELEASE_TABLET | Freq: Two times a day (BID) | ORAL | Status: DC
  Administered 2019-04-09 – 2019-05-09 (×60): 40 mg via ORAL
  Filled 2019-04-09 (×61): qty 1

## 2019-04-09 MED ORDER — SODIUM CHLORIDE 0.9% IV SOLUTION: Freq: Once | INTRAVENOUS | Status: DC

## 2019-04-09 MED ORDER — DARBEPOETIN ALFA 200 MCG/0.4ML IJ SOSY
200.0000 ug | PREFILLED_SYRINGE | INTRAMUSCULAR | Status: DC
  Administered 2019-04-17: 200 ug via INTRAVENOUS
  Filled 2019-04-09 (×3): qty 0.4

## 2019-04-09 MED ORDER — DIPHENOXYLATE-ATROPINE 2.5-0.025 MG PO TABS
1.0000 | ORAL_TABLET | Freq: Four times a day (QID) | ORAL | Status: DC | PRN
  Administered 2019-04-09 – 2019-04-15 (×8): 1 via ORAL
  Filled 2019-04-09 (×8): qty 1

## 2019-04-09 NOTE — Progress Notes (Signed)
Renal Navigator received call from [facility] Social Worker/[place] M informed Navigator that patient is their patient and has a seat time of 11:25am TTS. She requests update when he nears discharge, which Navigator agrees to.  Alphonzo Cruise, [place] Renal Navigator [phone]

## 2019-04-09 NOTE — Progress Notes (Signed)
Pt refused to let staff reposition him while awake. When asked about Q2h turning, pt stated " no do not wake me up, I can hardly sleep as it is"

## 2019-04-09 NOTE — Progress Notes (Signed)
Staff offered to reposition pt every 2 hours, pt stated he did not want to be woke up.

## 2019-04-09 NOTE — Progress Notes (Addendum)
Physical Therapy Session Note  Patient Details  Name: Calvin [name] MRN: [id number] Date of Birth: 1955/10/04  Today's Date: 04/09/2019 PT Individual Time: 4098-1191 AND 1415-1430 PT Individual Time Calculation (min): 55 min AND 15 min  Short Term Goals: Week 1:  PT Short Term Goal 1 (Week 1): Pt will initiate OOB mobility PT Short Term Goal 2 (Week 1): Pt will perform bed mobility w/ mod assist +1 PT Short Term Goal 3 (Week 1): Pt will initiate w/c propulsion  Skilled Therapeutic Interventions/Progress Updates:   Session 1:  Pt in supine and agreeable to therapy w/ encouragement, denies pain and reports he slept a little better last night. Pt remains anxious and w/ increased work of breathing. Pt noted to be at 2.5 L/min supplemental O2. Pt also asking about his Hb, feels it may be low. Encouraged pt to continue pushing through fatigue levels despite this. Donned shorts total assist in supine, R/L rolling w/ max assist while therapist brought shorts over hips. Pt very fearful of getting to EOB but agreeable, supine>sit w/ max assist w/ verbal and tactile cues for technique. Once seated EOB, close supervision for static sitting balance for ~10 min. Mod assist to doff and don new shirt. Pt actually w/ decreased work of breathing at edge of bed. Pt declined any OOB activity. Returned to supine and worked on BLE strengthening and stretching in supine. Also discussed tangible goals over next week and helped pt to make goals, wrote on large piece of paper and taped to wall. Also reviewed his progress and wrote this on the paper as well. Encouraged pt to keep track of the small, day-to-day progress, and to not be overwhelmed by the long term goal. Pt verbalized understanding and in agreement. Repositioned pt's BLEs into neutral alignment to promote knee extension stretching in supine. Ended session in supine and all needs in reach.   BLE strengthening and stretching in supine: -SAQs 3x20 -ankle pumps  2x30 -passive knee extension and ankle DF, 30 sec x3  Session 2:  Pt asleep in supine, needed tactile and verbal cues to wake up. Pt would briefly open eyes but then keep closed. Discussed w/ pt function of tilt table and benefits of initiating standing activity, pt continued to keep eyes closed and was not engaging in conversation. Do not think it would have been safe to perform tilt table, nor other upright activity. Pt does have low Hb and educated both pt and wife on physical effects of low Hb. Pt did verbalize how sorry he was for not being able to participate and pt believes he is wasting our time. Therapist educated him on realistic expectations of recovery including slow speed of recovery and taking things day-by-day. Wife also providing encouragement to pt. Ongoing discussion of d/c plan with wife including making a more definitive d/c date next conference, waiting until at least another week or 2 prior to making DME recommendations, and reassured both that therapy staff and SW will work to problem solve getting to/from dialysis 3x/week. Pt's wife continues to be a great advocate for pt.  Pt missed 20 min of skilled PT 2/2 fatigue.   Therapy Documentation Precautions:  Precautions Precautions: Fall Precaution Comments: monitor HR, L heel wound Restrictions Weight Bearing Restrictions: No  Therapy/Group: Individual Therapy  Calvin [name] Calvin [name] 04/09/2019, 8:59 AM

## 2019-04-09 NOTE — Progress Notes (Signed)
Patient reporting SOB especially in evenings--question anxiety related. Insisting on oxygen at nights --sats in 90%. Will have nursing encourage IS. Check CXR to rule out overload.

## 2019-04-09 NOTE — Progress Notes (Signed)
Pt has not voided since arriving to the unit. Pt bladder scanned for 200 ml. Pt was educated on In/Out Cath order due to no void. Pt refused the procedure, pt was educated on risk and benefits of refusing in/out cath. Pt continued to refuse.  Pt requested to take boots off, pt was educated on importance of leaving the boots on due to the breakdown on both heels. Pt continued to request for  Boots to come off.   During peri care/linen change b/c of incontinent diarrhea. Pt requesting medication to help control it.    Pt noted to have  2+ pitting edema in bilateral hips.

## 2019-04-09 NOTE — Progress Notes (Addendum)
[facility] Progress Note   Subjective:  Seen in room. With stable dyspnea and weakness which he and wife attribute to significant anemia. S/p HD yesterday with 2L UF off.  Objective Vitals:   04/08/19 1627 04/08/19 1639 04/08/19 1925 04/09/19 0342  BP: (!) 117/44 108/64 (!) 130/46 131/64  Pulse: 91 91 89 79  Resp: 16 16 17 19   Temp:  98.3 F (36.8 C) 97.9 F (36.6 C) 98.3 F (36.8 C)  TempSrc:   Oral Oral  SpO2:   92% 90%  Weight:  (!) 137.5 kg    Height:       Physical Exam General: Overweight man, NAD. Nasal O2 Heart: RRR; mechanical click Lungs: CTA anteriorly Abdomen: soft, non-tender. Pitting edema in B flanks Extremities: Trace B ankle edema, 1+ in B thighs Dialysis Access: TDC in R chest - no tenderness  Additional Objective Labs: Basic Metabolic Panel: Recent Labs  Lab 04/03/19 0626 04/05/19 0517 04/08/19 1846  NA 137 138 135  K 3.8 4.0 3.6  CL 97* 100 96*  CO2 27 24 26   GLUCOSE 99 91 106*  BUN 42* 55* 43*  CREATININE 5.88* 6.43* 5.36*  CALCIUM 8.6* 8.7* 7.9*  PHOS 4.3 4.9*  --    Liver Function Tests: Recent Labs  Lab 04/03/19 0626 04/05/19 0517  ALBUMIN 1.7* 1.8*   CBC: Recent Labs  Lab 04/03/19 0626 04/05/19 0517 04/08/19 0620  WBC 9.4 9.9 10.4  HGB 8.5* 8.0* 7.6*  HCT 28.4* 27.3* 25.5*  MCV 97.9 98.6 95.5  PLT 447* 501* 540*   Medications:  . sodium chloride   Intravenous Once  . ALPRAZolam  0.5 mg Oral TID  . calcitRIOL  0.25 mcg Oral Q T,Th,Sa-HD  . Chlorhexidine Gluconate Cloth  6 each Topical Daily  . [START ON 04/10/2019] darbepoetin (ARANESP) injection - DIALYSIS  200 mcg Intravenous Q Thu-HD  . feeding supplement (PRO-STAT SUGAR FREE 64)  30 mL Oral BID  . liver oil-zinc oxide   Topical TID  . metoprolol tartrate  12.5 mg Oral BID  . midodrine  10 mg Oral TID WC  . multivitamin  1 tablet Oral QHS  . pantoprazole  40 mg Oral BID  . polycarbophil  625 mg Oral TID WC  . saccharomyces boulardii  500 mg  Oral BID  . sertraline  50 mg Oral Daily  . sevelamer carbonate  800 mg Oral TID WC  . warfarin  6 mg Oral ONCE-1800  . Warfarin - Pharmacist Dosing Inpatient   Does not apply q1800    Dialysis Orders: Previously on PD -> transitioned recently to HD after episode peritonitis.  Assessment/Plan: 1. Debility: PT/OT per CIR. 2. ESRD: PD cath removed 11/16. Continue HD on TTS schedule here. + diffuse dependent edema, needs volume off. 3. HTN/volume: BP stable - UF as tolerated. On midodrine 10mg  TID. 4. Anemia: Hgb 7.6 and symptomatic. T&S today, plan 1U PRBC tomorrow on HD. Will also check iron studies and start Aranesp 23mcg q Thurs (his usual dose). Hx recent GIB - stable gastric ulcer, on PPI. No melena. 5. Secondary hyperparathyroidism:  CorrCa/Phos ok. Continue Renvela as binder and VDRA. 6. Nutrition: Alb exceptionally low - continue pro-stat supplements. 7. Hx mechanical AoV, A-fib/flutter: Warfarin per pharmacy - goal 2.5 8. T2DM 9. Hx testicular cancer  Veneta Penton, Hershal Coria 04/09/2019, 1:42 PM  [facility] Pager: [phone]  Pt seen, examined and agree w A/P as above. Cont to get volume down w/ HD tomorrow.  CXR pending for today.  [name]  MD 04/09/2019, 3:38 PM

## 2019-04-09 NOTE — Progress Notes (Signed)
ANTICOAGULATION CONSULT NOTE - Follow Up Consult  Pharmacy Consult for Coumadin Indication: St Jude AVR + Afib  Allergies  Allergen Reactions  . Ace Inhibitors Swelling, Anxiety and Other (See Comments)    Reaction to Altace (ramipril) "Can tolerate Benicar".  Diffuse swelling Edema Elevated kidney functions      . Methoxy Polyethylene Glycol-Epoetin Beta Other (See Comments)    Not effective; low hbg made patient feel awful( lethargic)    . Ramipril Other (See Comments)    Elevated kidney functions   . Tape Rash    Prefers clear tape     Patient Measurements: Height: 6\' 2"  (188 cm) Weight: (!) 303 lb 2.1 oz (137.5 kg) IBW/kg (Calculated) : 82.2  Vital Signs: Temp: 98.3 F (36.8 C) (12/09 0342) Temp Source: Oral (12/09 0342) BP: 131/64 (12/09 0342) Pulse Rate: 79 (12/09 0342)  Labs: Recent Labs    04/05/2019 0648 04/08/19 0620 04/08/19 1846 04/09/19 0546  HGB  --  7.6*  --   --   HCT  --  25.5*  --   --   PLT  --  540*  --   --   LABPROT 23.3* 24.0*  --  22.7*  INR 2.1* 2.2*  --  2.0*  CREATININE  --   --  5.36*  --     Estimated Creatinine Clearance: 21.1 mL/min (A) (by C-G formula based on SCr of 5.36 mg/dL (H)).  Assessment:  Anticoag: Warfarin PTA for hx St Jude AVR + Afib. Range should be 2.5-3.5 given both of these however per discussion w/ patient he was told to aim for 2.5 mid 2019 by MD - will aim for 2.5-3 in CIR. Pt noted to have recent GIB. Hgb 7.6 lower today. - PTA warfarin dose: 5mg  daily  Goal of Therapy:  INR 2.5-3 Monitor platelets by anticoagulation protocol: Yes   Plan:  - Repeat warfarin 6 mg PO x 1 - F/u PT/INR - Aim for 2.5-3 w/ recent GIB - Monitor for s/sx of bleeding   Thank you for involving pharmacy in this patient's care.  Renold Genta, PharmD, BCPS Clinical Pharmacist Clinical phone for 04/09/2019 until 3p is [phone] 04/09/2019 12:46 PM  **Pharmacist phone directory can be found on [email or web address] listed under [place]**

## 2019-04-09 NOTE — Progress Notes (Signed)
Occupational Therapy Session Note  Patient Details  Name: Calvin [name] MRN: [id number] Date of Birth: [birth year]  Today's Date: 04/09/2019 OT Individual Time: 1000-1100 OT Individual Time Calculation (min): 60 min    Short Term Goals: Week 1:  OT Short Term Goal 1 (Week 1): Pt will don UB clothing EOB with mod A OT Short Term Goal 2 (Week 1): Pt will transfer to EOB with mod A OT Short Term Goal 3 (Week 1): Pt will reduce anxiety during OT, evidenced by maintaining HR under 130 bpm during bed mobility  Skilled Therapeutic Interventions/Progress Updates:    1;1. Pt received in bed agreeable to OT despite low hemoglobin with encouragement to participate. Reviewed short term OT goals and wrote on list on wall for motivation. Pt states, "I dont want to be known as the guy who doesn't try." Pt supine>sitting EOB with MAX A for trunk elevation and reciprocal scooting to EOB. Pt reporting loose BM after bed mobility. Pt returns to supine with MAX A. Pt rolls with total A of 2 and +2 completes posterior/peri hygiene. Pt completes scooting up to [facility] with arms above head and hook lying. Pt wife enters room and OT educates on [facility] activity program of foam cubes, coins and beads to complete outside of therapy time. Exited session with pt seated in bed exit alarm  On and call light inr each  Therapy Documentation Precautions:  Precautions Precautions: Fall Precaution Comments: monitor HR, L heel wound Restrictions Weight Bearing Restrictions: No General:   Vital Signs: Therapy Vitals Temp: 98.3 F (36.8 C) Temp Source: Oral Pulse Rate: 79 Resp: 19 BP: 131/64 Patient Position (if appropriate): Lying Oxygen Therapy SpO2: 90 % O2 Device: Room Air Pain:   ADL: ADL Eating: Supervision/safety Where Assessed-Eating: Bed level Grooming: Supervision/safety Where Assessed-Grooming: Bed level Upper Body Bathing: Moderate assistance Where Assessed-Upper Body Bathing: Bed level Lower Body  Bathing: Dependent Where Assessed-Lower Body Bathing: Bed level Upper Body Dressing: Maximal assistance Where Assessed-Upper Body Dressing: Edge of bed Lower Body Dressing: Dependent Where Assessed-Lower Body Dressing: Bed level Toileting: Dependent Where Assessed-Toileting: Bed level Toilet Transfer: Unable to assess Vision   Perception    Praxis   Exercises:   Other Treatments:     Therapy/Group: Individual Therapy  Tonny Branch 04/09/2019, 7:00 AM

## 2019-04-09 NOTE — Progress Notes (Signed)
Pt continues to have incontinent BM of diarrhea. Pt is concerned about the amount of diarrhea, and he feels like it is a problem. Pt also continues to request for oxygen for comfort. Pt stat at 96% on RA, with dyspnea when lying flat. Pt refuses to have [place] elevated to help him breath better. PT also requesting PRN Xanax. Pt educated on the medication order that stands for xanax, but is requesting the PRN still. Pt shows signs of anxiety and very restless. When asked what is contributing to the anxiety, pt states nothing is wrong. Pt resting at this time, call light in reach.

## 2019-04-09 NOTE — Consult Note (Signed)
Neuropsychological Consultation   Patient:   Calvin [name]   DOB:   1955-06-21  MR Number:  [id number]  Location:  [place] [facility] [place] [id number] [place] [address] Dept: [place]: [phone]           Date of Service:   04/09/2019  Start Time:   1 PM End Time:   2 PM  Provider/Observer:  Ilean Skill, Psy.D.       Clinical Neuropsychologist       Billing Code/Service: [phone]  Chief Complaint:    Calvin [name] is a 63 year old male with history of CAD with stent, AVR, Afib, obesity, gout, testicular cancer, T2DM with neuropathy, chronic pain ESRD on PD, recent admission for sepsis.  Readmitted with weakness, diarrhea, persistent leucocytosis and problems with PD.  Anemia of chronic disease.  Significant debility and CIR recommended.  Patient has history of anxiety and current status exacerbating anxiety.  Reason for Service:  Patient referred for neuropsychological consultation due to coping and adjusmtnet issues.  Below is the HPI for the current admission.  HPI: Calvin [name] is a 63 year old male with history of CAD s/p repair with stent of coarctation of aorta, St Jude's AVR, A fib/A flutter, obesity, gout, testicular cancer, T2DM with neuropathy, chronic pain, ESRD- on PD, recent admission 02/19/19-03/02/19 for sepsis due to peritonitis and N/V with Mallory Weiss tear and gastric ulcer.  He was reportedly discharged to home on Vanc/cefepime (? Hospice care) but was readmitted to [facility] 03/12/19-03/25/19 with weakness, diarrhea, persistent leucocytosis and problems with PD catheter  .  He was started on Vancomycin and Meropenum per ID input and recommendations to continue antibiotics thorough 12/04. PD catheter removed by general surgery, permacath placed on 11/16 and he was transitioned to HD but has had issues hypotension during HD.  Hospital course significant for leucocytosis,  large volume diarrhea requiring rectal tube, deep tissue injury on sacrum and anemia requiring one unit PRBC. He was discharged to [facility] for therapy and IV antibiotics as significantly debilitated. HD ongoing TTS with midodrine for BP support.  Anemia of chronic disease managed with epo. Rectal tube removed by 11/30 but he is continent of bowel with ongoing diarrhea.has completed course of antibiotics by 12/5. He continues to have significant debility and CIR recommended for follow up therapy.    Current Status:  Patient was somnolent during visit.  Wife there to advocate for him but patient with SOB, weak and waxing/waining arousal.  Typical current status for him.  Given everything going on medically should not make any changes in anxiety or antidepressive meds.  (Xanax x3 and Zoloft).    Behavioral Observation: Calvin [name]  presents as a 63 y.o.-year-old Right Caucasian Male who appeared his stated age. his dress was Appropriate and he was Well Groomed and his manners were Appropriate to the situation.  his participation was indicative of Appropriate and Redirectable behaviors.  There were any physical disabilities noted.  he displayed an appropriate level of cooperation and motivation.     Interactions:    Active Appropriate, Drowsy and Redirectable  Attention:   abnormal and attention span appeared shorter than expected for age  Memory:   abnormal; remote memory intact, recent memory impaired  Visuo-spatial:  not examined  Speech (Volume):  low  Speech:   normal; slowed  Thought Process:  Coherent and Relevant  Though Content:  WNL; not suicidal  and not homicidal  Orientation:   person, place, time/date and situation  Judgment:   Fair  Planning:   Poor  Affect:    Anxious  Mood:    Anxious and Dysphoric  Insight:   Fair  Intelligence:   normal  Medical History:   Past Medical History:  Diagnosis Date  . Anemia   . Anemia of renal disease 10/04/2011  . Aortic aneurysm  ([place])   . Atrial fibrillation ([place])   . Atrial flutter ([place])   . AVD (aortic valve disease)   . Bilateral lower extremity edema 07/03/2012  . CAD (coronary artery disease)   . Chronic anticoagulation 07/10/2012  . CKD (chronic kidney disease)    baseline creatinine is 1.5-2  . CKD (chronic kidney disease) stage 3, GFR 30-59 ml/min 07/02/2012  . Coarctation of aorta (previous repair and stent) 04/05/2011  . Colonic ulcer 2007   felt to be secondary to NSAID  . Complex coarctation of the aorta   . Gastric polyp 2007, 06/2012   benign.  . Gout   . Gout 07/03/2012  . Hepatitis C   . History of atrial flutter s/p DCCV 04/05/2011  . HTN (hypertension)   . Hyperlipidemia   . Hypertension 04/05/2011  . Long term (current) use of anticoagulants 12/12/2013  . Obesity   . PONV (postoperative nausea and vomiting)   . Renal insufficiency   . S/P aortic valve replacement 04/05/2011  . 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 Testicular adenoma   . Thoracic aortic aneurysm ([place]) 04/05/2011   Psychiatric History:  Prior history of depression and anxiety.    Family Med/Psych History:  Family History  Problem Relation Age of Onset  . Heart disease Other        No family history  . Heart attack Neg Hx    Impression/DX:  Calvin [name] is a 63 year old male with history of CAD with stent, AVR, Afib, obesity, gout, testicular cancer, T2DM with neuropathy, chronic pain ESRD on PD, recent admission for sepsis.  Readmitted with weakness, diarrhea, persistent leucocytosis and problems with PD.  Anemia of chronic disease.  Significant debility and CIR recommended.  Patient has history of anxiety and current status exacerbating anxiety.   Diagnosis:    SOB (shortness of breath) - Plan: DG Chest 2 View, DG Chest 2 View       Anxiety and reactive depression.  Electronically Signed   _______________________ Ilean Skill, Psy.D.

## 2019-04-09 NOTE — Progress Notes (Signed)
[facility] PHYSICAL MEDICINE & REHABILITATION PROGRESS NOTE   Subjective/Complaints: Appears more fatigued than in acute care; was very tired by therapy yesterday. Could not recall what he did with therapist and required prompting from therapist. SOB did not increase during therapy but continues to be present at baseline. He and his wife are concerned about Hgb of 7.6 and asks whether he can continue to receive IV iron here. Also asks for regular diet. Continues to have diarrhea; antibiotics were stopped on Saturday. Asks that his Protonix be restarted; he was on it for gastric ulcer.   Objective:   No results found. Recent Labs    04/08/19 0620  WBC 10.4  HGB 7.6*  HCT 25.5*  PLT 540*   Recent Labs    04/08/19 1846  NA 135  K 3.6  CL 96*  CO2 26  GLUCOSE 106*  BUN 43*  CREATININE 5.36*  CALCIUM 7.9*    Intake/Output Summary (Last 24 hours) at 04/09/2019 1047 Last data filed at 04/09/2019 0852 Gross per 24 hour  Intake 700 ml  Output 2000 ml  Net -1300 ml     Physical Exam: Vital Signs Blood pressure 131/64, pulse 79, temperature 98.3 F (36.8 C), temperature source Oral, resp. rate 19, height 6\' 2"  (1.88 m), weight (!) 137.5 kg, SpO2 90 %. Constitutional: No distress . Vital signs reviewed. Lying in bed, fatigued.  HEENT: EOMI, oral membranes moist Neck: supple Cardiovascular: IRR with click. No JVD    Respiratory: clear without wheezes, short shallow breaths though. Dyspneic with convo GI: BS +, sl distended. Abdominal wounds healing/dry  Musculoskeletal:        General: Edema present LE.     Comments: Left shoulder limited due to injury from recent fall. BLE weakness 2/5 proximally (effort?).-1/5 PF on right and 2/5 PF on left.   Neurological: He is alert and oriented to person, place, and time.  Skin: Skin is warm and dry.  Multple toes with scabbed areas--due to walker injury. Large left heel wound with eschar, no drainage. Macerated, blistered areas in  gluteal folds Psych: pleasant but appears very anxious   Assessment/Plan: 1. Functional deficits secondary to debility which require 3+ hours per day of interdisciplinary therapy in a comprehensive inpatient rehab setting.  Physiatrist is providing close team supervision and 24 hour management of active medical problems listed below.  Physiatrist and rehab team continue to assess barriers to discharge/monitor patient progress toward functional and medical goals  Care Tool:  Bathing  Bathing activity did not occur: Refused           Bathing assist       Upper Body Dressing/Undressing Upper body dressing   What is the patient wearing?: Hospital gown only    Upper body assist Assist Level: Maximal Assistance - Patient 25 - 49%    Lower Body Dressing/Undressing Lower body dressing      What is the patient wearing?: Incontinence brief     Lower body assist Assist for lower body dressing: 2 Helpers     Toileting Toileting    Toileting assist Assist for toileting: 2 Helpers     Transfers Chair/bed transfer  Transfers assist  Chair/bed transfer activity did not occur: Safety/medical concerns  Chair/bed transfer assist level: 2 Helpers     Locomotion Ambulation   Ambulation assist   Ambulation activity did not occur: Safety/medical concerns          Walk 10 feet activity   Assist  Walk 10 feet  activity did not occur: Safety/medical concerns        Walk 50 feet activity   Assist Walk 50 feet with 2 turns activity did not occur: Safety/medical concerns         Walk 150 feet activity   Assist Walk 150 feet activity did not occur: Safety/medical concerns         Walk 10 feet on uneven surface  activity   Assist Walk 10 feet on uneven surfaces activity did not occur: Safety/medical concerns         Wheelchair     Assist     Wheelchair activity did not occur: Safety/medical concerns         Wheelchair 50 feet with 2  turns activity    Assist    Wheelchair 50 feet with 2 turns activity did not occur: Safety/medical concerns       Wheelchair 150 feet activity     Assist  Wheelchair 150 feet activity did not occur: Safety/medical concerns       Blood pressure 131/64, pulse 79, temperature 98.3 F (36.8 C), temperature source Oral, resp. rate 19, height 6\' 2"  (1.88 m), weight (!) 137.5 kg, SpO2 90 %.  Medical Problem List and Plan: 1.  Impaired mobility and ADLs secondary to deibility             -patient may shower             -ELOS/Goals: modI in PT, OT, I in SLP 2.  St Jude's Aortic Valve/Antithrombotics: -DVT/anticoagulation:  Pharmaceutical: Coumadin (INR 2.2)             -antiplatelet therapy: N/A 3. Pain Management: Oxycodone prn 4. Mood: LCSW to follow for evaluation and support.              -antipsychotic agents: N/A  -anxiety disorder/ recent MDD: Will begin scheduled xanax tid   -discussed relaxation and breathing techniques with pt   -will ask neuropsych to see 5. Neuropsych: This patient is capable of making decisions on his own behalf. 6. Skin/Wound Care: Air mattress overlay for MASD due to ongoing diarrhea.  Added EPC cream to form a protective barrier from frequent loose stools.   -prevalon boots bilateral heels 7. Fluids/Electrolytes/Nutrition: Strict I/O. Renal diet with 1200 cc FR.  8. ESRD: Now on HD with attempts to get off 2 L. Schedule hemodialysis at the end of the day to help with therapy tolerance.   -Renal diet with 1200 cc FR-- extra protein per patient requests.     -dietary ed  12/9: Patient requests regular diet. Appreciate dietician assessment on 12/8. Labs stable and electrolytes WDL so agree to liberalization to regular diet to meet patient's food preferences. Hopefully will provide him with more energy to tolerate his therapies.  9. Hypotension: Monitor BP tid--continue midodrine. 10 History of A fib/A flutter: Monitor HR t--on coumadin. Continue  Lopressor 12.5mg  BID 11. Recent GIB/Anemia of chronic disease: Continue to monitor H/H with serial checks. On Retacrit 10,000 units with HD.   12/9: No need for transfusion at this point since Hgb 7.6. Will repeat CBC with next HD session. Will discuss with wife when she comes in today.  12. Loose stool: C diff negative  -continue fiber  -increase probiotic to 500mg   Bid  -diet  -prn lomotil 13. Cough with phlegm: Guaifenesin prn 14. Shortness of breath at rest: Counseled regarding regular use of incentive spirometer. He is currently able to reach 750. Agrees to use IS every  hour.      LOS: 2 days A FACE TO FACE EVALUATION WAS PERFORMED  Clide Deutscher Ashiya Kinkead 04/09/2019, 10:47 AM

## 2019-04-10 ENCOUNTER — Inpatient Hospital Stay (HOSPITAL_COMMUNITY): Payer: Medicare Other

## 2019-04-10 DIAGNOSIS — R195 Other fecal abnormalities: Secondary | ICD-10-CM | POA: Diagnosis not present

## 2019-04-10 DIAGNOSIS — R5381 Other malaise: Secondary | ICD-10-CM | POA: Diagnosis not present

## 2019-04-10 DIAGNOSIS — N186 End stage renal disease: Secondary | ICD-10-CM | POA: Diagnosis not present

## 2019-04-10 DIAGNOSIS — F411 Generalized anxiety disorder: Secondary | ICD-10-CM | POA: Diagnosis not present

## 2019-04-10 LAB — RENAL FUNCTION PANEL
Albumin: 1.9 g/dL — ABNORMAL LOW (ref 3.5–5.0)
Anion gap: 15 (ref 5–15)
BUN: 72 mg/dL — ABNORMAL HIGH (ref 8–23)
CO2: 25 mmol/L (ref 22–32)
Calcium: 8.5 mg/dL — ABNORMAL LOW (ref 8.9–10.3)
Chloride: 97 mmol/L — ABNORMAL LOW (ref 98–111)
Creatinine, Ser: 8.62 mg/dL — ABNORMAL HIGH (ref 0.61–1.24)
GFR calc Af Amer: 7 mL/min — ABNORMAL LOW (ref >60)
GFR calc non Af Amer: 6 mL/min — ABNORMAL LOW (ref >60)
Glucose, Bld: 108 mg/dL — ABNORMAL HIGH (ref 70–99)
Phosphorus: 6.9 mg/dL — ABNORMAL HIGH (ref 2.5–4.6)
Potassium: 4.1 mmol/L (ref 3.5–5.1)
Sodium: 137 mmol/L (ref 135–145)

## 2019-04-10 LAB — CBC
HCT: 24.2 % — ABNORMAL LOW (ref 39.0–52.0)
Hemoglobin: 7.3 g/dL — ABNORMAL LOW (ref 13.0–17.0)
MCH: 28.9 pg (ref 26.0–34.0)
MCHC: 30.2 g/dL (ref 30.0–36.0)
MCV: 95.7 fL (ref 80.0–100.0)
Platelets: 549 10*3/uL — ABNORMAL HIGH (ref 150–400)
RBC: 2.53 MIL/uL — ABNORMAL LOW (ref 4.22–5.81)
RDW: 17.2 % — ABNORMAL HIGH (ref 11.5–15.5)
WBC: 12.6 10*3/uL — ABNORMAL HIGH (ref 4.0–10.5)
nRBC: 0 % (ref 0.0–0.2)

## 2019-04-10 LAB — PROTIME-INR
INR: 2.2 — ABNORMAL HIGH (ref 0.8–1.2)
Prothrombin Time: 24.1 seconds — ABNORMAL HIGH (ref 11.4–15.2)

## 2019-04-10 MED ORDER — ALBUMIN HUMAN 25 % IV SOLN
INTRAVENOUS | Status: AC
  Administered 2019-04-10: 16:00:00 25 g via INTRAVENOUS
  Filled 2019-04-10: qty 100

## 2019-04-10 MED ORDER — ALPRAZOLAM 0.25 MG PO TABS
0.2500 mg | ORAL_TABLET | Freq: Three times a day (TID) | ORAL | Status: DC
  Administered 2019-04-10 – 2019-04-13 (×7): 0.25 mg via ORAL
  Filled 2019-04-10 (×7): qty 1

## 2019-04-10 MED ORDER — ALBUMIN HUMAN 25 % IV SOLN
25.0000 g | Freq: Once | INTRAVENOUS | Status: AC
  Filled 2019-04-10: qty 100

## 2019-04-10 MED ORDER — MIDODRINE HCL 5 MG PO TABS
ORAL_TABLET | ORAL | Status: AC
  Administered 2019-04-10: 17:00:00 10 mg via ORAL
  Filled 2019-04-10: qty 2

## 2019-04-10 MED ORDER — WARFARIN SODIUM 3 MG PO TABS
6.0000 mg | ORAL_TABLET | Freq: Once | ORAL | Status: AC
  Administered 2019-04-10: 6 mg via ORAL
  Filled 2019-04-10: qty 2

## 2019-04-10 MED ORDER — DARBEPOETIN ALFA 200 MCG/0.4ML IJ SOSY
PREFILLED_SYRINGE | INTRAMUSCULAR | Status: AC
  Administered 2019-04-10: 17:00:00 200 ug via INTRAVENOUS
  Filled 2019-04-10: qty 0.4

## 2019-04-10 MED ORDER — CALCITRIOL 0.25 MCG PO CAPS
ORAL_CAPSULE | ORAL | Status: AC
  Administered 2019-04-10: 17:00:00 0.25 ug via ORAL
  Filled 2019-04-10: qty 1

## 2019-04-10 MED ORDER — HEPARIN SODIUM (PORCINE) 1000 UNIT/ML IJ SOLN
INTRAMUSCULAR | Status: AC
  Administered 2019-04-10: 4700 [IU]
  Filled 2019-04-10: qty 6

## 2019-04-10 NOTE — Progress Notes (Signed)
Occupational Therapy Session Note  Patient Details  Name: Calvin [name] MRN: [id number] Date of Birth: [birth year]    Today's Date: 04/10/2019 OT Individual Time: 8546-2703 and 500-938 OT Individual Time Calculation (min): 10 min and 45 min   Short Term Goals: Week 1:  OT Short Term Goal 1 (Week 1): Pt will don UB clothing EOB with mod A OT Short Term Goal 2 (Week 1): Pt will transfer to EOB with mod A OT Short Term Goal 3 (Week 1): Pt will reduce anxiety during OT, evidenced by maintaining HR under 130 bpm during bed mobility  Skilled Therapeutic Interventions/Progress Updates:    1;1. Pt received in bed with no pain reported and RN delivering medicaitons. Pt requires MAX encouragement to sit EOB to doff/don shirt and take medications. Pt reporting fatigue but willing to sit EOB with increased time. +2 A for supine>sitting EOB/scooting. Pt able to maintain sitting balance statically with CGA ~5 min at EOB while repeatedly asking to lie down. Pt able to doff shirt but requires MAX A to don shirt. Pt insists on lying down d/;t fatigue. Pt able to pull on rails and bridge slightly through feet (OT bracing to keep from sliding off of bed). Pt repositioned HOB elevated. Pt picks up large pills with RUE and places in mouth dropping 1/3. After set up for end of session, pt reporting needing emesis basin. Pt with large episode of vomiting at end of session and RN alerted and present to provide A as OT exits  Session 2: Pt received in bed asleep requiring significant time to arouse. Pt reporting extreme fatigue, stating, "I want to do what you want me to do , but I cant with my hemoglobin this low" Edu re POC and need to continue to mobilize to improve endurance. No evidence of learning as pt falling asleep ~5 seconds. Exited session with pt seated in bed, exit alarm on, and call light in reach. Pt missed 50 min skilled OT  Therapy Documentation Precautions:  Precautions Precautions: Fall Precaution  Comments: monitor HR, L heel wound Restrictions Weight Bearing Restrictions: No General:   Vital Signs: Therapy Vitals Temp: 98 F (36.7 C) Temp Source: Oral Pulse Rate: 74 Resp: 16 BP: (!) 125/48 Patient Position (if appropriate): Lying Oxygen Therapy SpO2: 91 % O2 Device: Room Air Pain: Pain Assessment Pain Scale: 0-10 Pain Score: 0-No pain ADL: ADL Eating: Supervision/safety Where Assessed-Eating: Bed level Grooming: Supervision/safety Where Assessed-Grooming: Bed level Upper Body Bathing: Moderate assistance Where Assessed-Upper Body Bathing: Bed level Lower Body Bathing: Dependent Where Assessed-Lower Body Bathing: Bed level Upper Body Dressing: Maximal assistance Where Assessed-Upper Body Dressing: Edge of bed Lower Body Dressing: Dependent Where Assessed-Lower Body Dressing: Bed level Toileting: Dependent Where Assessed-Toileting: Bed level Toilet Transfer: Unable to assess Vision   Perception    Praxis   Exercises:   Other Treatments:     Therapy/Group: Individual Therapy  Tonny Branch 04/10/2019, 9:05 AM

## 2019-04-10 NOTE — Progress Notes (Signed)
ANTICOAGULATION CONSULT NOTE - Follow Up Consult  Pharmacy Consult for Coumadin Indication: St Jude AVR + Afib  Allergies  Allergen Reactions  . Ace Inhibitors Swelling, Anxiety and Other (See Comments)    Reaction to Altace (ramipril) "Can tolerate Benicar".  Diffuse swelling Edema Elevated kidney functions      . Methoxy Polyethylene Glycol-Epoetin Beta Other (See Comments)    Not effective; low hbg made patient feel awful( lethargic)    . Ramipril Other (See Comments)    Elevated kidney functions   . Tape Rash    Prefers clear tape     Patient Measurements: Height: 6\' 2"  (188 cm) Weight: (!) 303 lb 2.1 oz (137.5 kg) IBW/kg (Calculated) : 82.2  Vital Signs: Temp: 98 F (36.7 C) (12/10 0519) Temp Source: Oral (12/10 0519) BP: 102/68 (12/10 1136) Pulse Rate: 93 (12/10 1136)  Labs: Recent Labs    04/08/19 0620 04/08/19 1846 04/09/19 0546 04/10/19 1524 04/10/19 1525  HGB 7.6*  --   --   --  7.3*  HCT 25.5*  --   --   --  24.2*  PLT 540*  --   --   --  549*  LABPROT 24.0*  --  22.7* 24.1*  --   INR 2.2*  --  2.0* 2.2*  --   CREATININE  --  5.36*  --   --   --     Estimated Creatinine Clearance: 21.1 mL/min (A) (by C-G formula based on SCr of 5.36 mg/dL (H)).  Assessment:  Anticoag: Warfarin PTA for hx St Jude AVR + Afib. Range should be 2.5-3.5 given both of these; however, per discussion with patient, he was told to aim for 2.5 mid 2019 by MD - will aim for 2.5-3 in CIR. Pt noted to have recent GIB.  PTA warfarin dose: 5 mg daily  INR this afternoon is 2.2, which is below the goal INR of 2.5-3 for this patient. H/H 7.3/24.2. Per RN, no bleeding observed; pt to get blood transfusion in HD, per RN.  Goal of Therapy:  INR 2.5-3 Monitor platelets by anticoagulation protocol: Yes   Plan:  Warfarin 6 mg PO x 1 this evening Monitor daily INR, CBC Aim for 2.5-3 with recent GIB Monitor for signs/symtoms of bleeding  Thank you for involving pharmacy in  this patient's care.  Gillermina Hu, PharmD, BCPS, [facility] Clinical Pharmacist 04/10/2019 3:49 PM

## 2019-04-10 NOTE — Progress Notes (Addendum)
[place] PHYSICAL MEDICINE & REHABILITATION PROGRESS NOTE   Subjective/Complaints: Appear fatigued. Says he slept ok. Feels that his hgb is affecting his ability to participate. Denies sob a rest but does c/o sob with activity  ROS: Limited due to cognitive/behavioral .   Objective:   DG Chest 2 View  Result Date: 04/09/2019 CLINICAL DATA:  63 year old presenting with acute shortness of breath. Current history of aortic coarctation. EXAM: CHEST - 2 VIEW COMPARISON:  03/25/2019 and earlier, including CTA chest 10/16/2018. FINDINGS: Prior sternotomy. Cardiac silhouette moderately enlarged, unchanged. Atherosclerosis involving the ascending thoracic aorta. Stent in the proximal descending thoracic aorta at the site of the coarctation as noted on the prior CTA. RIGHT jugular dialysis catheter with its tip at or near the cavoatrial junction Moderate-sized RIGHT pleural effusion and associated dense consolidation in the RIGHT LOWER LOBE. Linear atelectasis involving the RIGHT MIDDLE LOBE. Lungs otherwise clear. No visible LEFT pleural effusion. Normal pulmonary vascularity. IMPRESSION: 1. Moderate-sized RIGHT pleural effusion and associated dense passive atelectasis and/or pneumonia in the RIGHT LOWER LOBE. 2. Linear atelectasis involving the RIGHT MIDDLE LOBE. 3. Stable cardiomegaly without pulmonary edema. Electronically Signed   By: Evangeline Dakin M.D.   On: 04/09/2019 17:13   Recent Labs    04/08/19 0620  WBC 10.4  HGB 7.6*  HCT 25.5*  PLT 540*   Recent Labs    04/08/19 1846  NA 135  K 3.6  CL 96*  CO2 26  GLUCOSE 106*  BUN 43*  CREATININE 5.36*  CALCIUM 7.9*    Intake/Output Summary (Last 24 hours) at 04/10/2019 1006 Last data filed at 04/10/2019 0837 Gross per 24 hour  Intake 480 ml  Output -  Net 480 ml     Physical Exam: Vital Signs Blood pressure (!) 125/48, pulse 74, temperature 98 F (36.7 C), temperature source Oral, resp. rate 16, height 6\' 2"  (1.88 m),  weight (!) 137.5 kg, SpO2 91 %. Constitutional: appears fatigued. No distress HEENT: EOMI, oral membranes moist Neck: supple Cardiovascular: RRR without murmur. No JVD    Respiratory: poor effort, decreased sounds at both basess GI: BS +, non-tender, sl distended. Scarring noted Musculoskeletal:        General: Edema present LE.1+     Comments: Left shoulder limited due to injury from recent fall. BLE weakness 2/5 proximally (effort?).-1/5 PF on right and 2/5 PF on left.--no changes Neurological: He is alert and oriented to person, place, and time.  Skin: Skin is warm and dry.  Multple toes with scabbed areas--due to walker injury. Large left heel wound with eschar, no drainage. Right heel with small lateral blister. Macerated, blistered areas in gluteal folds Psych: flat, difficult to engage today   Assessment/Plan: 1. Functional deficits secondary to debility which require 3+ hours per day of interdisciplinary therapy in a comprehensive inpatient rehab setting.  Physiatrist is providing close team supervision and 24 hour management of active medical problems listed below.  Physiatrist and rehab team continue to assess barriers to discharge/monitor patient progress toward functional and medical goals  Care Tool:  Bathing  Bathing activity did not occur: Refused           Bathing assist       Upper Body Dressing/Undressing Upper body dressing   What is the patient wearing?: Hospital gown only    Upper body assist Assist Level: Maximal Assistance - Patient 25 - 49%    Lower Body Dressing/Undressing Lower body dressing      What is the  patient wearing?: Incontinence brief     Lower body assist Assist for lower body dressing: 2 Helpers     Toileting Toileting    Toileting assist Assist for toileting: Total Assistance - Patient < 25%     Transfers Chair/bed transfer  Transfers assist  Chair/bed transfer activity did not occur: Safety/medical  concerns  Chair/bed transfer assist level: 2 Helpers     Locomotion Ambulation   Ambulation assist   Ambulation activity did not occur: Safety/medical concerns          Walk 10 feet activity   Assist  Walk 10 feet activity did not occur: Safety/medical concerns        Walk 50 feet activity   Assist Walk 50 feet with 2 turns activity did not occur: Safety/medical concerns         Walk 150 feet activity   Assist Walk 150 feet activity did not occur: Safety/medical concerns         Walk 10 feet on uneven surface  activity   Assist Walk 10 feet on uneven surfaces activity did not occur: Safety/medical concerns         Wheelchair     Assist     Wheelchair activity did not occur: Safety/medical concerns         Wheelchair 50 feet with 2 turns activity    Assist    Wheelchair 50 feet with 2 turns activity did not occur: Safety/medical concerns       Wheelchair 150 feet activity     Assist  Wheelchair 150 feet activity did not occur: Safety/medical concerns       Blood pressure (!) 125/48, pulse 74, temperature 98 F (36.7 C), temperature source Oral, resp. rate 16, height 6\' 2"  (1.88 m), weight (!) 137.5 kg, SpO2 91 %.  Medical Problem List and Plan: 1.  Impaired mobility and ADLs secondary to deibility             -patient may shower             -ELOS/Goals: modI in PT, OT, I in SLP  -reduced to 15/7.  May need SNF 2.  St Jude's Aortic Valve/Antithrombotics: -DVT/anticoagulation:  Pharmaceutical: Coumadin (INR 2.2)             -antiplatelet therapy: N/A 3. Pain Management: Oxycodone prn 4. Mood: LCSW to follow for evaluation and support.              -antipsychotic agents: N/A  -anxiety disorder/ recent MDD: reduce scheduled xanax to 0.25mg  tid   -discussed relaxation and breathing techniques with pt   -appreciate neuropsych visit 5. Neuropsych: This patient is capable of making decisions on his own behalf. 6.  Skin/Wound Care: Air mattress overlay for MASD due to ongoing diarrhea.  Added EPC cream to form a protective barrier from frequent loose stools.   -prevalon boots bilateral heels 7. Fluids/Electrolytes/Nutrition: Strict I/O. Renal diet with 1200 cc FR.  8. ESRD: Now on HD with attempts to get off 2 L. Schedule hemodialysis at the end of the day to help with therapy tolerance. TTS schedule  -Renal diet with 1200 cc FR-- extra protein per patient requests.     -dietary ed  12/10: diet liberalized to help with intake   Filed Weights   04/25/2019 1520 04/08/19 1245 04/08/19 1639  Weight: (!) 144.9 kg (!) 139.5 kg (!) 137.5 kg     9. Hypotension: Monitor BP tid--continue midodrine. 10 History of A fib/A flutter: Monitor HR  t--on coumadin. Continue Lopressor 12.5mg  BID 11. Recent GIB/Anemia of chronic disease: Continue to monitor H/H with serial checks. On Retacrit 10,000 units with HD.   12/10: hgb/tranfusion per nephrology.  12. Loose stool: C diff negative  -continue fiber  -increase probiotic to 500mg   Bid  -diet  -prn lomotil 13. Cough with phlegm: Guaifenesin prn 14. Shortness of breath at rest: encouraged use of incentive spirometer.     -CXR doesn't show obvious fluid overload but does have atelectasis and ?inflitrate and effusion right base   -check CBC today   -hesitate initiating abx given c diff   -weights have been trending down not up.    -suspect a significant portion of SOB and fatigue is mood-related    -reduce xanax as above LOS: 3 days A FACE TO FACE EVALUATION WAS PERFORMED  Meredith Staggers 04/10/2019, 10:06 AM

## 2019-04-10 NOTE — Progress Notes (Addendum)
Physical Therapy Session Note  Patient Details  Name: Calvin [name] MRN: [id number] Date of Birth: 12-10-1955  Today's Date: 04/10/2019 PT Individual Time: 1115-1200 PT Individual Time Calculation (min): 45 min   Short Term Goals: Week 1:  PT Short Term Goal 1 (Week 1): Pt will initiate OOB mobility PT Short Term Goal 2 (Week 1): Pt will perform bed mobility w/ mod assist +1 PT Short Term Goal 3 (Week 1): Pt will initiate w/c propulsion Week 2:    Week 3:     Skilled Therapeutic Interventions/Progress Updates:     PAIN: denies pain this am. Pt initially supine and wife at bedside. Pt required encouragement to participate in session.  Pt rolls to R w/max assist, verbal cues for sequencing.  R side to sit w/mod assist and cues.   Scoots in sitting to edge of bed w/max assist.  Pt sat x 5 min and performed bilat AROM including ankle pumps and LAQ's partial ROM, requires encouragement to continue.  Wife expressing concern requesting pt be returned supine to rest.  Sit to side to supine w/max assist and cues for sequencing  BP checked by nursing 104/61.   Wife very concerned w/fact that pt very fatigued and she expressed that pt needed blood transfusion/low hgb.  Also verbalized concern w/anxiety, vitals, anemia, level of consciousness, etc.  Therapist spoke extensively w/wife to educate/reassure pt regarding rehab process, goals, rationale for rx, safety procedures, communication etc.  Wife less anxious following discussion and also educated re access to PT notes so she can track daily progress when she is not present.   Wife also clarifying her goals for pt to be able to transfer to wc and ambulate short distances ie/in/out of Bathroom. Before dc to home.  Also discussed getting doorway measurements for wc w/wc size TBD once pt oob in wc.   Pt repeated supine to side to sit as above, after encouragement from PT to participate and goal of 5 min presented to pt.  Pt sat at edge of bed x 5 min  w/cga, pt frequntly asking for time updates but achieving goal.  Worked on sitting balance w/propping, hands in lap, cervical AROM.  Sit to supine w/max assist and cues.  Worked on scooting in bed using bedrails, bridging, lifting head.  Requires max assist of 2 for scooting to hob. Pt left supine w/rails up x 3, alarm set, bed in lowest position, and needs in reach, wife present.   Therapy Documentation Precautions:  Precautions Precautions: Fall Precaution Comments: monitor HR, L heel wound Restrictions Weight Bearing Restrictions: No   Therapy/Group: Individual Therapy  Callie Fielding, [place] 04/10/2019, 12:34 PM

## 2019-04-10 NOTE — Progress Notes (Addendum)
[facility] Progress Note   Subjective:  Seen in room - says "wiped out". Not using nasal O2. No CP. CXR 12/9 with moderate R pleural effusion.  Objective Vitals:   04/09/19 1456 04/09/19 1458 04/09/19 1929 04/10/19 0519  BP: (!) 96/26 (!) 98/50 (!) 118/46 (!) 125/48  Pulse: 68  87 74  Resp: 16  19 16   Temp: 97.9 F (36.6 C)  97.9 F (36.6 C) 98 F (36.7 C)  TempSrc:   Oral Oral  SpO2: 94%  93% 91%  Weight:      Height:       Physical Exam General: Overweight man, NAD. Nasal O2 Heart: RRR; mechanical click Lungs: CTA anteriorly Abdomen: soft, non-tender. Pitting edema in B flanks Extremities: Trace B ankle edema, 1+ in B thighs Dialysis Access: TDC in R chest - no tenderness  Additional Objective Labs: Basic Metabolic Panel: Recent Labs  Lab 04/05/19 0517 04/08/19 1846  NA 138 135  K 4.0 3.6  CL 100 96*  CO2 24 26  GLUCOSE 91 106*  BUN 55* 43*  CREATININE 6.43* 5.36*  CALCIUM 8.7* 7.9*  PHOS 4.9*  --    Liver Function Tests: Recent Labs  Lab 04/05/19 0517  ALBUMIN 1.8*   CBC: Recent Labs  Lab 04/05/19 0517 04/08/19 0620  WBC 9.9 10.4  HGB 8.0* 7.6*  HCT 27.3* 25.5*  MCV 98.6 95.5  PLT 501* 540*   Iron Studies:  Recent Labs    04/09/19 1432  IRON 25*  TIBC 160*  FERRITIN 1,384*   Studies/Results: DG Chest 2 View  Result Date: 04/09/2019 CLINICAL DATA:  63 year old presenting with acute shortness of breath. Current history of aortic coarctation. EXAM: CHEST - 2 VIEW COMPARISON:  03/25/2019 and earlier, including CTA chest 10/16/2018. FINDINGS: Prior sternotomy. Cardiac silhouette moderately enlarged, unchanged. Atherosclerosis involving the ascending thoracic aorta. Stent in the proximal descending thoracic aorta at the site of the coarctation as noted on the prior CTA. RIGHT jugular dialysis catheter with its tip at or near the cavoatrial junction Moderate-sized RIGHT pleural effusion and associated dense consolidation in the  RIGHT LOWER LOBE. Linear atelectasis involving the RIGHT MIDDLE LOBE. Lungs otherwise clear. No visible LEFT pleural effusion. Normal pulmonary vascularity. IMPRESSION: 1. Moderate-sized RIGHT pleural effusion and associated dense passive atelectasis and/or pneumonia in the RIGHT LOWER LOBE. 2. Linear atelectasis involving the RIGHT MIDDLE LOBE. 3. Stable cardiomegaly without pulmonary edema. Electronically Signed   By: Evangeline Dakin M.D.   On: 04/09/2019 17:13   Medications: . ferric gluconate (FERRLECIT/NULECIT) IV      . sodium chloride   Intravenous Once  . ALPRAZolam  0.25 mg Oral TID  . calcitRIOL  0.25 mcg Oral Q T,Th,Sa-HD  . Chlorhexidine Gluconate Cloth  6 each Topical Daily  . darbepoetin (ARANESP) injection - DIALYSIS  200 mcg Intravenous Q Thu-HD  . feeding supplement (PRO-STAT SUGAR FREE 64)  30 mL Oral BID  . liver oil-zinc oxide   Topical TID  . metoprolol tartrate  12.5 mg Oral BID  . midodrine  10 mg Oral TID WC  . multivitamin  1 tablet Oral QHS  . pantoprazole  40 mg Oral BID  . polycarbophil  625 mg Oral TID WC  . saccharomyces boulardii  500 mg Oral BID  . sertraline  50 mg Oral Daily  . sevelamer carbonate  800 mg Oral TID WC  . Warfarin - Pharmacist Dosing Inpatient   Does not apply q1800    Dialysis Orders: Previously  on PD -> transitioned recently to HD after episode peritonitis.  Assessment/Plan: 1. Debility: PT/OT per CIR. 2. ESRD: PD cath removed 11/16. Continue HD on TTS schedule here. + diffuse dependent edema, needs volume off. 3. HTN/volume: BP stable - UF as tolerated. Anasarca and R pleural effusion. On midodrine 10mg  TID. Large UF goal w/ HD today, will see how is tolerated 4. Anemia: Hgb 7.6 and symptomatic. Plan 1U PRBC today on HD. Tsat 16 - start IV iron and will give Aranesp 210mcg q Thurs (his usual dose). Hx recent GIB - stable gastric ulcer, on PPI. No melena. 5. Secondary hyperparathyroidism:  CorrCa/Phos ok. Continue Renvela as binder  and VDRA. 6. Nutrition: Alb exceptionally low - continue pro-stat supplements. 7. Hx mechanical AoV, A-fib/flutter: Warfarin per pharmacy - goal 2.5 8. T2DM 9. Hx testicular cancer  Veneta Penton, PA-C  04/10/2019, 11:13 AM  [facility] Pager: [phone]  Pt seen, examined and agree w A/P as above.  [name]  MD 04/10/2019, 3:26 PM

## 2019-04-10 NOTE — Progress Notes (Signed)
Social Work Assessment and Plan   Patient Details  Name: Calvin [name] MRN: [id number] Date of Birth: 06/01/1955  Today's Date: 04/10/2019  Problem List:  Patient Active Problem List   Diagnosis Date Noted  . SOB (shortness of breath)   . Physical debility 04/15/2019  . Complex coarctation of the aorta   . Hepatitis C   . AVD (aortic valve disease)   . Long term (current) use of anticoagulants 12/12/2013  . Chronic anticoagulation 07/10/2012  . Gastric ulcer 07/04/2012  . Gout 07/03/2012  . Bilateral lower extremity edema 07/03/2012  . CKD (chronic kidney disease) stage 3, GFR 30-59 ml/min 07/02/2012  . Knee pain 12/03/2011  . Anemia of renal disease 10/04/2011  . Hyperlipidemia 04/23/2011  . 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 S/P aortic valve replacement 04/05/2011  . Thoracic aortic aneurysm ([place]) 04/05/2011  . Atrial fibrillation ([place]) 04/05/2011  . History of atrial flutter s/p DCCV 04/05/2011  . Coarctation of aorta (previous repair and stent) 04/05/2011  . Hypertension 04/05/2011  . CAD (coronary artery disease) 04/05/2011  . [place]inoma ([place]) 04/03/2011  . Anemia, iron deficiency 04/03/2011   Past Medical History:  Past Medical History:  Diagnosis Date  . Anemia   . Anemia of renal disease 10/04/2011  . Aortic aneurysm ([place])   . Atrial fibrillation ([place])   . Atrial flutter ([place])   . AVD (aortic valve disease)   . Bilateral lower extremity edema 07/03/2012  . CAD (coronary artery disease)   . Chronic anticoagulation 07/10/2012  . CKD (chronic kidney disease)    baseline creatinine is 1.5-2  . CKD (chronic kidney disease) stage 3, GFR 30-59 ml/min 07/02/2012  . Coarctation of aorta (previous repair and stent) 04/05/2011  . Colonic ulcer 2007   felt to be secondary to NSAID  . Complex coarctation of the aorta   . Gastric polyp 2007, 06/2012   benign.  . Gout   . Gout 07/03/2012  . Hepatitis C   . History of atrial flutter s/p DCCV 04/05/2011  . HTN (hypertension)   .  Hyperlipidemia   . Hypertension 04/05/2011  . Long term (current) use of anticoagulants 12/12/2013  . Obesity   . PONV (postoperative nausea and vomiting)   . Renal insufficiency   . S/P aortic valve replacement 04/05/2011  . 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 Testicular adenoma   . Thoracic aortic aneurysm ([place]) 04/05/2011   Past Surgical History:  Past Surgical History:  Procedure Laterality Date  . AORTIC VALVE REPLACEMENT  1995   [facility]  . AORTIC VALVE REPLACEMENT AND MITRAL VALVE REPAIR  1974   Bicuspid aortic valve  . CARDIOVERSION  02/27/2012   Procedure: CARDIOVERSION;  Surgeon: Lelon Perla, MD;  Location: [facility] ENDOSCOPY;  Service: Cardiovascular;  Laterality: N/A;  . CARDIOVERSION N/A 05/29/2013   Procedure: CARDIOVERSION;  Surgeon: Lelon Perla, MD;  Location: [facility] ENDOSCOPY;  Service: Cardiovascular;  Laterality: N/A;  . CARDIOVERSION N/A 12/15/2013   Procedure: CARDIOVERSION;  Surgeon: Sueanne Margarita, MD;  Location: [facility] ENDOSCOPY;  Service: Cardiovascular;  Laterality: N/A;  . CARDIOVERSION N/A 08/25/2014   Procedure: CARDIOVERSION;  Surgeon: Lelon Perla, MD;  Location: [facility] ENDOSCOPY;  Service: Cardiovascular;  Laterality: N/A;  . CARDIOVERSION N/A 02/17/2015   Procedure: CARDIOVERSION;  Surgeon: Larey Dresser, MD;  Location: [place];  Service: Cardiovascular;  Laterality: N/A;  . [place]  . COARCTATION OF AORTA REPAIR  1970  .  ESOPHAGOGASTRODUODENOSCOPY N/A 07/04/2012   Procedure: ESOPHAGOGASTRODUODENOSCOPY (EGD);  Surgeon: Inda Castle, MD;  Location: [place];  Service: Endoscopy;  Laterality: N/A;  . ORCHIECTOMY  1992   testicular cancer  . TONSILLECTOMY     Social History:  reports that he quit smoking about 37 years ago. His smoking use included cigarettes. He started smoking about 43 years ago. He has a 1.75 pack-year smoking history. He has never used smokeless tobacco. He reports current  alcohol use. He reports that he does not use drugs.  Family / Support Systems Marital Status: Married How Long?: 55 yrs Patient Roles: Spouse, Parent Spouse/Significant Other: wife, Calvin [name] @ [phone] Children: pt and wife have 3 adult children - wife notes all are "special needs" children Anticipated Caregiver: spouse and two grown children  Ability/Limitations of Caregiver: Mod/Max A Caregiver Availability: 24/7 Family Dynamics: Wife very committed to providing "all that I can" for pt and notes that she is able to provide max assistance.  She also notes that her adult children can assist as well.  Social History Preferred language: English Religion: Christian Cultural Background: NA Read: Yes Write: Yes Date Retired/Disabled/Unemployed: ~2014 Legal History/Current Legal Issues: None Guardian/Conservator: None - per MD, pt is capable of making decisions on his own behalf.   Abuse/Neglect Abuse/Neglect Assessment Can Be Completed: Yes Physical Abuse: Denies Verbal Abuse: Denies Sexual Abuse: Denies Exploitation of patient/patient's resources: Denies Self-Neglect: Denies  Emotional Status Pt's affect, behavior and adjustment status: Pt lying in bed and appears fatigued but able to complete assessment interview without much difficulty.  Wife present and assists with information as well. Pt denies any significant emotional distress, however, reports having concerns "when people say that they don't think I'm trying as hard as I can."  Wife privately shared that she is very concerned about his mood and states, "He can very quicly start spiraling down.  He needs goals to aim for." Recent Psychosocial Issues: Chronic health issues Psychiatric History: Depression and anxiety Substance Abuse History: None  Patient / Family Perceptions, Expectations & Goals Pt/Family understanding of illness & functional limitations: Pt and wife with very good understanding of the infection  which has led to his medical decline and lengthy hospitalization.  Good understanding of current functional limitations. Wife very realistic about potential extent of care he may need in the home and already prepared for hoyer lift, etc. Premorbid pt/family roles/activities: Pt's wife was assisting pt with ADLs and mobiltiy as needed PTA. Anticipated changes in roles/activities/participation: Per goals of mod assist, wife will need to increase the physical level of support she needs to provide. Pt/family expectations/goals: "I just want everybody to give me a chance."  [employer]: None Premorbid Home Care/DME Agencies: Other (Comment)([facility]) Transportation available at discharge: yes Resource referrals recommended: Neuropsychology  Discharge Planning Living Arrangements: Spouse/significant other, Children Support Systems: Spouse/significant other, Children Type of Residence: Private residence Insurance Resources: [employer], Multimedia programmer (specify)([employer]) Financial Resources: [employer] Financial Screen Referred: No Living Expenses: Medical laboratory scientific officer Management: Spouse Does the patient have any problems obtaining your medications?: No Home Management: mostly wife and family Patient/Family Preliminary Plans: Pt and wife hope that pt will be able to return home with wife providing primary care, however, can consider private duty services as well. Social Work Anticipated Follow Up Needs: HH/OP Expected length of stay: 2-3 weeks  Clinical Impression Pleasant gentleman with wife at bedside here followed significant functional decline due to infection and additional chronic medical issues.  He is motivated  for CIR, however, concerned that some tx team members might not agree and states, "I just want them to give me a chance."  Wife and adult children are able to provide a high level of physical assistance as well as ability to bring in private duty if needed.  Wife is very  realistic about the level of support he may need and about all of the care options available.  Concern for mood decline and has already had consult with neuropsychology who can follow.  Will follow for ongoing dc planning and support.  Hildreth Robart 04/10/2019, 4:37 PM

## 2019-04-10 NOTE — IPOC Note (Signed)
Overall Plan of Care [facility]) Patient Details Name: Calvin [name] MRN: [id number] DOB: November 23, 1955  Admitting Diagnosis: Physical debility  Hospital Problems: Principal Problem:   Physical debility Active Problems:   SOB (shortness of breath)     Functional Problem List: Nursing Bladder, Bowel, Edema, Endurance, Medication Management, Motor, Pain, Skin Integrity  PT Behavior, Endurance, Motor, Sensory, Skin Integrity, Balance  OT Balance, Behavior, Endurance, Motor, Safety, Sensory, Skin Integrity  SLP    TR         Basic ADL's: OT Bathing, Dressing, Toileting     Advanced  ADL's: OT       Transfers: PT Bed Mobility, Floor, Bed to Chair, Car, Chief Operating Officer: PT Ambulation, Wheelchair Mobility     Additional Impairments: OT None  SLP        TR      Anticipated Outcomes Item Anticipated Outcome  Self Feeding no goal set  Swallowing      Basic self-care  min A  Toileting  mod A   Bathroom Transfers mod A  Bowel/Bladder  Pt will manage bowel and bladder with min assist  Transfers  min assist  Locomotion  supervision w/c mobility, mod assist short distance gait  Communication     Cognition     Pain  Pt will manage pain at 3 or less on a scale of 0-10.  Safety/Judgment  Pt will remain free of falls with injury while in rehab with min assist   Therapy Plan: PT Intensity: Minimum of 1-2 x/day ,45 to 90 minutes PT Frequency: Total of 15 hours over 7 days of combined therapies PT Duration Estimated Length of Stay: 2-3 weeks OT Intensity: Minimum of 1-2 x/day, 45 to 90 minutes OT Frequency: 5 out of 7 days OT Duration/Estimated Length of Stay: 2-3 weeks     Due to the current state of emergency, patients may not be receiving their 3-hours of Medicare-mandated therapy.   Team Interventions: Nursing Interventions Patient/Family Education, Bladder Management, Bowel Management, Medication Management, Pain Management, Discharge  Planning, Disease Management/Prevention, Skin Care/Wound Management  PT interventions Ambulation/gait training, Discharge planning, DME/adaptive equipment instruction, Functional mobility training, Pain management, Psychosocial support, Splinting/orthotics, Therapeutic Activities, UE/LE Strength taining/ROM, Wheelchair propulsion/positioning, UE/LE Coordination activities, Therapeutic Exercise, Skin care/wound management, Patient/family education, Neuromuscular re-education, Functional electrical stimulation, Disease management/prevention, Academic librarian, Training and development officer  OT Interventions Training and development officer, Engineer, drilling, Therapeutic Activities, Patient/family education, Wheelchair propulsion/positioning, Therapeutic Exercise, Psychosocial support, Community reintegration, Functional mobility training, Self Care/advanced ADL retraining, UE/LE Strength taining/ROM, UE/LE Coordination activities, Skin care/wound managment, Discharge planning, Disease mangement/prevention, Pain management  SLP Interventions    TR Interventions    SW/CM Interventions Discharge Planning, Psychosocial Support, Patient/Family Education   Barriers to Discharge MD  Medical stability and Behavior  Nursing      PT Medical stability, Wound Care, Behavior high anxiety, multiple wounds at gluteal fold  OT Hemodialysis, Incontinence    SLP      SW       Team Discharge Planning: Destination: PT-Home ,OT- Home , SLP-  Projected Follow-up: PT-Home health PT, OT-  Home health OT, SLP-  Projected Equipment Needs: PT-To be determined, OT- To be determined, SLP-  Equipment Details: PT- , OT-  Patient/family involved in discharge planning: PT- Patient,  OT-Patient, SLP-   MD ELOS: 2-3 weeks Medical Rehab Prognosis:  Fair Assessment: The patient has been admitted for CIR therapies with the diagnosis of debility after multiple medical, failure of PD.  The team will be addressing  functional mobility, strength, stamina, balance, safety, adaptive techniques and equipment, self-care, bowel and bladder mgt, patient and caregiver education, NMR, activity tolerance, breathing techniques, ego support. Goals have been set at min to mod assist. Therapy 15/7. Question ability to tolerate our program  Due to the current state of emergency, patients may not be receiving their 3 hours per day of Medicare-mandated therapy.    Meredith Staggers, MD, FAAPMR      See Team Conference Notes for weekly updates to the plan of care

## 2019-04-11 ENCOUNTER — Inpatient Hospital Stay (HOSPITAL_COMMUNITY): Payer: Medicare Other

## 2019-04-11 DIAGNOSIS — N186 End stage renal disease: Secondary | ICD-10-CM | POA: Diagnosis not present

## 2019-04-11 DIAGNOSIS — D649 Anemia, unspecified: Secondary | ICD-10-CM

## 2019-04-11 DIAGNOSIS — R195 Other fecal abnormalities: Secondary | ICD-10-CM | POA: Diagnosis not present

## 2019-04-11 DIAGNOSIS — F411 Generalized anxiety disorder: Secondary | ICD-10-CM | POA: Diagnosis not present

## 2019-04-11 DIAGNOSIS — R5381 Other malaise: Secondary | ICD-10-CM | POA: Diagnosis not present

## 2019-04-11 LAB — TYPE AND SCREEN
ABO/RH(D): O POS
Antibody Screen: NEGATIVE
Unit division: 0

## 2019-04-11 LAB — BPAM RBC
Blood Product Expiration Date: 202101112359
ISSUE DATE / TIME: 202012101546
Unit Type and Rh: 5100

## 2019-04-11 LAB — PROTIME-INR
INR: 2 — ABNORMAL HIGH (ref 0.8–1.2)
Prothrombin Time: 22.4 seconds — ABNORMAL HIGH (ref 11.4–15.2)

## 2019-04-11 MED ORDER — SEVELAMER CARBONATE 800 MG PO TABS
1600.0000 mg | ORAL_TABLET | Freq: Three times a day (TID) | ORAL | Status: DC
  Administered 2019-04-13 – 2019-04-29 (×32): 1600 mg via ORAL
  Administered 2019-04-30: 800 mg via ORAL
  Administered 2019-05-02 (×2): 1600 mg via ORAL
  Filled 2019-04-11 (×63): qty 2

## 2019-04-11 MED ORDER — WARFARIN SODIUM 7.5 MG PO TABS
7.5000 mg | ORAL_TABLET | Freq: Once | ORAL | Status: AC
  Administered 2019-04-11: 7.5 mg via ORAL
  Filled 2019-04-11: qty 1

## 2019-04-11 NOTE — Progress Notes (Signed)
Occupational Therapy Session Note  Patient Details  Name: Calvin [name] MRN: [id number] Date of Birth: [birth year]  Today's Date: 04/11/2019 OT Individual Time: 1005-1050 OT Individual Time Calculation (min): 45 min    Short Term Goals: Week 1:  OT Short Term Goal 1 (Week 1): Pt will don UB clothing EOB with mod A OT Short Term Goal 2 (Week 1): Pt will transfer to EOB with mod A OT Short Term Goal 3 (Week 1): Pt will reduce anxiety during OT, evidenced by maintaining HR under 130 bpm during bed mobility  Skilled Therapeutic Interventions/Progress Updates:    1:1. Pt received in bed reporting extra therapy received at 9am and having fatigue. Extensive education provided on POC, 3 hour rule and 15/7 schedule.  Pt agreeable to getting to EOB to change shirt. Pt requires MAX A to get EOB with flat bed and bed rails with improved participation from yesterday. Pt able to doff and don shirt seated EOB with supervision! Pt able ot maintain static sitting balance EOB with S for 10 min with HR 114 and O2 96. Pt requires MAX A to return to supine and scoot to headboard with OT bracing feet. Pt given theraputty to use in between session and handout of exercises to review in afternoon session. Exited session with pt seated in bed, exit alarm on and call light inreach  Therapy Documentation Precautions:  Precautions Precautions: Fall Precaution Comments: monitor HR, L heel wound Restrictions Weight Bearing Restrictions: No General:   Vital Signs:   Pain: Pain Assessment Pain Scale: 0-10 Pain Score: 0-No pain ADL: ADL Eating: Supervision/safety Where Assessed-Eating: Bed level Grooming: Supervision/safety Where Assessed-Grooming: Bed level Upper Body Bathing: Moderate assistance Where Assessed-Upper Body Bathing: Bed level Lower Body Bathing: Dependent Where Assessed-Lower Body Bathing: Bed level Upper Body Dressing: Maximal assistance Where Assessed-Upper Body Dressing: Edge of  bed Lower Body Dressing: Dependent Where Assessed-Lower Body Dressing: Bed level Toileting: Dependent Where Assessed-Toileting: Bed level Toilet Transfer: Unable to assess Vision   Perception    Praxis   Exercises:   Other Treatments:     Therapy/Group: Individual Therapy  Tonny Branch 04/11/2019, 10:47 AM

## 2019-04-11 NOTE — Care Management (Signed)
[facility] Individual Statement of Services  Patient Name:  Calvin [name]  Date:  04/11/2019  Welcome to the [facility].  Our goal is to provide you with an individualized program based on your diagnosis and situation, designed to meet your specific needs.  With this comprehensive rehabilitation program, you will be expected to participate in at least 3 hours of rehabilitation therapies Monday-Friday, with modified therapy programming on the weekends.  Your rehabilitation program will include the following services:  Physical Therapy (PT), Occupational Therapy (OT), 24 hour per day rehabilitation nursing, Therapeutic Recreaction (TR), Neuropsychology, Case Management (Social Worker), Rehabilitation Medicine, Nutrition Services and Pharmacy Services  Weekly team conferences will be held on Tuesdays to discuss your progress.  Your Social Worker will talk with you frequently to get your input and to update you on team discussions.  Team conferences with you and your family in attendance may also be held.  Expected length of stay: 2-3 weeks   Overall anticipated outcome: moderate assistance  Depending on your progress and recovery, your program may change. Your Social Worker will coordinate services and will keep you informed of any changes. Your Social Worker's name and contact numbers are listed  below.  The following services may also be recommended but are not provided by the [facility] will be made to provide these services after discharge if needed.  Arrangements include referral to agencies that provide these services.  Your insurance has been verified to be:  Medicare/ [employer] Your primary doctor is:  Catering manager  Pertinent information will be shared with your doctor and your insurance company.  Social Worker:  [place], [place] or ([phone]   Information discussed with and copy given to patient by: Lennart Pall, 04/11/2019, 4:42 PM

## 2019-04-11 NOTE — Plan of Care (Signed)
  Problem: Consults Goal: RH GENERAL PATIENT EDUCATION Description: See Patient Education module for education specifics. Outcome: Progressing Goal: Skin Care Protocol Initiated - if Braden Score 18 or less Description: If consults are not indicated, leave blank or document N/A Outcome: Progressing   Problem: RH BOWEL ELIMINATION Goal: RH STG MANAGE BOWEL WITH ASSISTANCE Description: STG Manage Bowel with Mod Assistance. Outcome: Progressing Goal: RH STG MANAGE BOWEL W/MEDICATION W/ASSISTANCE Description: STG Manage Bowel with Medication with Mod Assistance. Outcome: Progressing   Problem: RH BLADDER ELIMINATION Goal: RH STG MANAGE BLADDER WITH ASSISTANCE Description: STG Manage Bladder With Mod Assistance Outcome: Progressing   Problem: RH SKIN INTEGRITY Goal: RH STG SKIN FREE OF INFECTION/BREAKDOWN Description: No new breakdown with min assist  Outcome: Progressing Goal: RH STG ABLE TO PERFORM INCISION/WOUND CARE W/ASSISTANCE Description: STG Able To Perform Incision/Wound Care With Max Assistance. Outcome: Progressing   Problem: RH PAIN MANAGEMENT Goal: RH STG PAIN MANAGED AT OR BELOW PT'S PAIN GOAL Description: < 3 out of 10.  Outcome: Progressing

## 2019-04-11 NOTE — Progress Notes (Signed)
Occupational Therapy Session Note  Patient Details  Name: Calvin [name] MRN: [id number] Date of Birth: 09/08/1955  Today's Date: 04/11/2019 OT Individual Time: 3143-8887 OT Individual Time Calculation (min): 60 min    Short Term Goals: Week 1:  OT Short Term Goal 1 (Week 1): Pt will don UB clothing EOB with mod A OT Short Term Goal 2 (Week 1): Pt will transfer to EOB with mod A OT Short Term Goal 3 (Week 1): Pt will reduce anxiety during OT, evidenced by maintaining HR under 130 bpm during bed mobility  Skilled Therapeutic Interventions/Progress Updates:    1;1. Pt received in bed agreeable to OT however just had bowel movement in brief. Pt rolls B directions with MAX A and +2 cleanses buttocks. Pt with improved sequencing and activation during rolling and supine>sitting with bed rail. Pt sits EOB for 10 min with S and OT reviews theraputty exercises with handout. Pt able to return demo and wife present viewing technique to work with pt outside of tx time. Pt given theraband level 2 and handout and reviewed UB therex for same purpose as above so later tx time can be devoted to mobilization and ADLs to decrease burden of care. Pt and OT participate in goal setting for the weekend with pt verbalizing wanting to transfer into the chair, work on standing in stedy and sitting EOB/EOM for 15 min. Exited session with pt seated in bed, exit alarm on and call light in reach  Therapy Documentation Precautions:  Precautions Precautions: Fall Precaution Comments: monitor HR, L heel wound Restrictions Weight Bearing Restrictions: No General:   Vital Signs: Therapy Vitals Temp: 97.6 F (36.4 C) Pulse Rate: 88 Resp: 20 BP: (!) 143/38 Patient Position (if appropriate): Lying Oxygen Therapy SpO2: 95 % Pain:   ADL: ADL Eating: Supervision/safety Where Assessed-Eating: Bed level Grooming: Supervision/safety Where Assessed-Grooming: Bed level Upper Body Bathing: Moderate assistance Where  Assessed-Upper Body Bathing: Bed level Lower Body Bathing: Dependent Where Assessed-Lower Body Bathing: Bed level Upper Body Dressing: Maximal assistance Where Assessed-Upper Body Dressing: Edge of bed Lower Body Dressing: Dependent Where Assessed-Lower Body Dressing: Bed level Toileting: Dependent Where Assessed-Toileting: Bed level Toilet Transfer: Unable to assess Vision   Perception    Praxis   Exercises:   Other Treatments:     Therapy/Group: Individual Therapy  Tonny Branch 04/11/2019, 4:40 PM

## 2019-04-11 NOTE — Progress Notes (Signed)
[facility] PHYSICAL MEDICINE & REHABILITATION PROGRESS NOTE   Subjective/Complaints: In better spirits. Says he wants to perform better than he is with therapy thus far. Feels that blood txn will help. Slept ok. Says he was nauseas yesterday d/t my cologne  ROS: Patient denies fever, rash, sore throat, blurred vision, nausea, vomiting, diarrhea, cough, shortness of breath or chest pain, joint or back pain, headache, or mood change.   Objective:   DG Chest 2 View  Result Date: 04/09/2019 CLINICAL DATA:  63 year old presenting with acute shortness of breath. Current history of aortic coarctation. EXAM: CHEST - 2 VIEW COMPARISON:  03/25/2019 and earlier, including CTA chest 10/16/2018. FINDINGS: Prior sternotomy. Cardiac silhouette moderately enlarged, unchanged. Atherosclerosis involving the ascending thoracic aorta. Stent in the proximal descending thoracic aorta at the site of the coarctation as noted on the prior CTA. RIGHT jugular dialysis catheter with its tip at or near the cavoatrial junction Moderate-sized RIGHT pleural effusion and associated dense consolidation in the RIGHT LOWER LOBE. Linear atelectasis involving the RIGHT MIDDLE LOBE. Lungs otherwise clear. No visible LEFT pleural effusion. Normal pulmonary vascularity. IMPRESSION: 1. Moderate-sized RIGHT pleural effusion and associated dense passive atelectasis and/or pneumonia in the RIGHT LOWER LOBE. 2. Linear atelectasis involving the RIGHT MIDDLE LOBE. 3. Stable cardiomegaly without pulmonary edema. Electronically Signed   By: Evangeline Dakin M.D.   On: 04/09/2019 17:13   Recent Labs    04/10/19 1525  WBC 12.6*  HGB 7.3*  HCT 24.2*  PLT 549*   Recent Labs    04/08/19 1846 04/10/19 1526  NA 135 137  K 3.6 4.1  CL 96* 97*  CO2 26 25  GLUCOSE 106* 108*  BUN 43* 72*  CREATININE 5.36* 8.62*  CALCIUM 7.9* 8.5*    Intake/Output Summary (Last 24 hours) at 04/11/2019 1118 Last data filed at 04/11/2019 0730 Gross per  24 hour  Intake 835 ml  Output --  Net 835 ml     Physical Exam: Vital Signs Blood pressure 102/63, pulse 86, temperature 98 F (36.7 C), temperature source Oral, resp. rate 20, height 6\' 2"  (1.88 m), weight (!) 136.2 kg, SpO2 94 %. Constitutional: No distress . Vital signs reviewed. Morbidly obese HEENT: EOMI, oral membranes moist Neck: supple Cardiovascular: RRR with click. No JVD    Respiratory: CTA Bilaterally without wheezes or rales. Normal effort    GI: BS +, non-tender, non-distended  Musculoskeletal:        General: Edema present LE.1+     Comments: Left shoulder limited due to injury from recent fall. BLE weakness 2/5 proximally (effort?).-1/5 PF on right and 2/5 PF on left. Neurological: He is alert and oriented to person, place, and time.  Skin: Skin is warm and dry.  Multple toes with scabbed areas--due to walker injury. Large left heel wound with eschar, no drainage. Right heel with small lateral blister. Macerated, blistered areas in gluteal folds---skin appears stable Psych: cooperative, anxious   Assessment/Plan: 1. Functional deficits secondary to debility which require 3+ hours per day of interdisciplinary therapy in a comprehensive inpatient rehab setting.  Physiatrist is providing close team supervision and 24 hour management of active medical problems listed below.  Physiatrist and rehab team continue to assess barriers to discharge/monitor patient progress toward functional and medical goals  Care Tool:  Bathing  Bathing activity did not occur: Refused           Bathing assist       Upper Body Dressing/Undressing Upper body dressing  What is the patient wearing?: Hospital gown only    Upper body assist Assist Level: Maximal Assistance - Patient 25 - 49%    Lower Body Dressing/Undressing Lower body dressing      What is the patient wearing?: Incontinence brief     Lower body assist Assist for lower body dressing: 2 Helpers      Toileting Toileting    Toileting assist Assist for toileting: 2 Helpers     Transfers Chair/bed transfer  Transfers assist  Chair/bed transfer activity did not occur: Safety/medical concerns  Chair/bed transfer assist level: 2 Helpers     Locomotion Ambulation   Ambulation assist   Ambulation activity did not occur: Safety/medical concerns          Walk 10 feet activity   Assist  Walk 10 feet activity did not occur: Safety/medical concerns        Walk 50 feet activity   Assist Walk 50 feet with 2 turns activity did not occur: Safety/medical concerns         Walk 150 feet activity   Assist Walk 150 feet activity did not occur: Safety/medical concerns         Walk 10 feet on uneven surface  activity   Assist Walk 10 feet on uneven surfaces activity did not occur: Safety/medical concerns         Wheelchair     Assist     Wheelchair activity did not occur: Safety/medical concerns         Wheelchair 50 feet with 2 turns activity    Assist    Wheelchair 50 feet with 2 turns activity did not occur: Safety/medical concerns       Wheelchair 150 feet activity     Assist  Wheelchair 150 feet activity did not occur: Safety/medical concerns       Blood pressure 102/63, pulse 86, temperature 98 F (36.7 C), temperature source Oral, resp. rate 20, height 6\' 2"  (1.88 m), weight (!) 136.2 kg, SpO2 94 %.  Medical Problem List and Plan: 1.  Impaired mobility and ADLs secondary to deibility             -patient may shower             -ELOS/Goals: modI in PT, OT, I in SLP  -reduced to 15/7.  May need SNF. Discussed expectations from activity standpoint with pt and wife today 2.  St Jude's Aortic Valve/Antithrombotics: -DVT/anticoagulation:  Pharmaceutical: Coumadin (INR 2.0)             -antiplatelet therapy: N/A 3. Pain Management: Oxycodone prn 4. Mood: LCSW to follow for evaluation and support.               -antipsychotic agents: N/A  -anxiety disorder/ recent MDD: reduce scheduled xanax to 0.25mg  tid   -discussed relaxation and breathing techniques with pt   -appreciate neuropsych visit 5. Neuropsych: This patient is capable of making decisions on his own behalf. 6. Skin/Wound Care: Air mattress overlay for MASD due to ongoing diarrhea.     -continue EPC cream to form a protective barrier on buttocks   -rx loose stools  -prevalon boots bilateral heels 7. Fluids/Electrolytes/Nutrition: Strict I/O. Renal diet with 1200 cc FR.  8. ESRD: Now on HD with attempts to get off 2 L. Schedule hemodialysis at the end of the day to help with therapy tolerance. TTS schedule   12/10: diet liberalized to help with intake  -volume/edema mgt per nephrology [facility]  04/08/19 1639 04/10/19 1505 04/11/19 0500  Weight: (!) 137.5 kg (!) 141 kg (!) 136.2 kg     9. Hypotension: Monitor BP tid--continue midodrine. 10 History of A fib/A flutter: Monitor HR t--on coumadin. Continue Lopressor 12.5mg  BID 11. Recent GIB/Anemia of chronic disease: Continue to monitor H/H with serial checks. On Retacrit 10,000 units with HD.   12/11: hgb/tranfusion per nephrology 1u --feels better today 12. Loose stool: C diff negative  -continue fiber  -increased probiotic to 500mg   Bid  -diet  -prn lomotil  -stools mushy but gradually improving  -discussed with pt/wife today 13. Cough with phlegm: Guaifenesin prn 14. Shortness of breath at rest: encouraged use of incentive spirometer.     -CXR doesn't show obvious fluid overload but does have atelectasis and ?inflitrate and effusion right base   -wbc's sl up to 12.6 12/10   -hesitate initiating abx given diarrhea   -weights have been trending down not up.    -suspect a significant portion of SOB and fatigue is mood-related    -reduced xanax as above LOS: 4 days A FACE TO FACE EVALUATION WAS PERFORMED  Calvin [name] 04/11/2019, 11:18 AM

## 2019-04-11 NOTE — Progress Notes (Addendum)
[facility] Progress Note   Subjective:  Seen in room - working with PT but still low energy. S/p 1U PRBCs, IV iron, and Aranesp with HD yesterday.  Objective Vitals:   04/10/19 2259 04/11/19 0019 04/11/19 0355 04/11/19 0500  BP:  (!) 113/46 102/63   Pulse: (!) 101 87 86   Resp:  (!) 22 20   Temp:  97.9 F (36.6 C) 98 F (36.7 C)   TempSrc:  Oral Oral   SpO2: 94% 93% 94%   Weight:    (!) 136.2 kg  Height:       Physical Exam General:Overweight man, NAD. Nasal O2 Heart:RRR; mechanical click Lungs:CTA anteriorly Abdomen:soft, non-tender. Pitting edema in B flanks Extremities:Trace B ankle edema, 1+ in B thighs Dialysis Access:TDC in R chest - no tenderness  Additional Objective Labs: Basic Metabolic Panel: Recent Labs  Lab 04/05/19 0517 04/08/19 1846 04/10/19 1526  NA 138 135 137  K 4.0 3.6 4.1  CL 100 96* 97*  CO2 24 26 25   GLUCOSE 91 106* 108*  BUN 55* 43* 72*  CREATININE 6.43* 5.36* 8.62*  CALCIUM 8.7* 7.9* 8.5*  PHOS 4.9*  --  6.9*   Liver Function Tests: Recent Labs  Lab 04/05/19 0517 04/10/19 1526  ALBUMIN 1.8* 1.9*   CBC: Recent Labs  Lab 04/05/19 0517 04/08/19 0620 04/10/19 1525  WBC 9.9 10.4 12.6*  HGB 8.0* 7.6* 7.3*  HCT 27.3* 25.5* 24.2*  MCV 98.6 95.5 95.7  PLT 501* 540* 549*   Iron Studies:  Recent Labs    04/09/19 1432  IRON 25*  TIBC 160*  FERRITIN 1,384*   Studies/Results: DG Chest 2 View  Result Date: 04/09/2019 CLINICAL DATA:  63 year old presenting with acute shortness of breath. Current history of aortic coarctation. EXAM: CHEST - 2 VIEW COMPARISON:  03/25/2019 and earlier, including CTA chest 10/16/2018. FINDINGS: Prior sternotomy. Cardiac silhouette moderately enlarged, unchanged. Atherosclerosis involving the ascending thoracic aorta. Stent in the proximal descending thoracic aorta at the site of the coarctation as noted on the prior CTA. RIGHT jugular dialysis catheter with its tip at or near the  cavoatrial junction Moderate-sized RIGHT pleural effusion and associated dense consolidation in the RIGHT LOWER LOBE. Linear atelectasis involving the RIGHT MIDDLE LOBE. Lungs otherwise clear. No visible LEFT pleural effusion. Normal pulmonary vascularity. IMPRESSION: 1. Moderate-sized RIGHT pleural effusion and associated dense passive atelectasis and/or pneumonia in the RIGHT LOWER LOBE. 2. Linear atelectasis involving the RIGHT MIDDLE LOBE. 3. Stable cardiomegaly without pulmonary edema. Electronically Signed   By: Evangeline Dakin M.D.   On: 04/09/2019 17:13   Medications: . ferric gluconate (FERRLECIT/NULECIT) IV 125 mg (04/10/19 1751)   . sodium chloride   Intravenous Once  . ALPRAZolam  0.25 mg Oral TID  . calcitRIOL  0.25 mcg Oral Q T,Th,Sa-HD  . Chlorhexidine Gluconate Cloth  6 each Topical Daily  . darbepoetin (ARANESP) injection - DIALYSIS  200 mcg Intravenous Q Thu-HD  . feeding supplement (PRO-STAT SUGAR FREE 64)  30 mL Oral BID  . liver oil-zinc oxide   Topical TID  . metoprolol tartrate  12.5 mg Oral BID  . midodrine  10 mg Oral TID WC  . multivitamin  1 tablet Oral QHS  . pantoprazole  40 mg Oral BID  . polycarbophil  625 mg Oral TID WC  . saccharomyces boulardii  500 mg Oral BID  . sertraline  50 mg Oral Daily  . sevelamer carbonate  800 mg Oral TID WC  . warfarin  7.5 mg Oral ONCE-1800  . Warfarin - Pharmacist Dosing Inpatient   Does not apply q1800    Dialysis Orders: Previously on PD -> transitioned recently to HD after episode peritonitis.  Assessment/Plan: 1.Debility:PT/OT per CIR. 2. ESRD:PD cath removed 11/16. Continue HD on TTS schedulehere. +diffuse dependentedema, needs volume off. Next HD 12/12. 3.HTN/volume:BP stable. Anasarca improving, and R pleural effusion on CXR. On midodrine 10mg  TID. 4L off on 12/10 (not doc in chart).  4. Anemia:Hgb 7.3 and symptomatic -> now s/p 1U PRBC, Aranesp 232mcg q Thurs (his usual dose), and started course IV iron.  Hx recent GIB - stable gastric ulcer, on PPI. No melena. 5. Secondary hyperparathyroidism:CorrCa ok, Phos ^. Continue VDRA, increase binder. 6. Nutrition:Alb exceptionally low - continue pro-stat supplements. 7. Hx mechanical AoV, A-fib/flutter:Warfarin per pharmacy - goal 2.5 8. T2DM 9. Hx testicular cancer  Veneta Penton, PA-C 04/11/2019, 11:13 AM  [facility] Pager: [phone]  Pt seen, examined and agree w A/P as above. Getting vol down. R effusion could be tapped if persists and SOB doesn't improve.  [name]  MD 04/11/2019, 12:06 PM

## 2019-04-11 NOTE — Progress Notes (Signed)
Physical Therapy Session Note AM session performed as make-up for missed time   Patient Details  Name: Calvin [name] MRN: [id number] Date of Birth: 1956/04/02  Today's Date: 04/11/2019 PT Individual Time: 1300-1400 and 9:00 to 9:20   PT Individual Time Calculation (min): 60 min and  And 20 min   Short Term Goals: Week 1:  PT Short Term Goal 1 (Week 1): Pt will initiate OOB mobility PT Short Term Goal 2 (Week 1): Pt will perform bed mobility w/ mod assist +1 PT Short Term Goal 3 (Week 1): Pt will initiate w/c propulsion Week 2:    Week 3:     Skilled Therapeutic Interventions/Progress Updates:      Therapy Documentation Precautions:  Precautions Precautions: Fall Precaution Comments: monitor HR, L heel wound Restrictions Weight Bearing Restrictions: No  Am session:   Pt denies pain this am Pt agreeable to session for makeup time and eager to participate.  Initially supine and wife communicating w/therapist via telephone.  Expressing concerns about possible dc if pt is not able to participate in therapy sessions.  Discussed todays plan and time makeup occurring this am.  Pt supine to sit w/max assist of 1 - states "let me do as much of this myself as I can".  Goal set of sitting on edge of bed x 6 min today and pt sits w/close supervision, checks time periodically, did not want to discuss pm plan as he felt this may "overwhelm" him.  Pt reached 6 min then stated he would like to continue w/ sitting and try for 73min.  Pt able to sit unsupported as above for total of 27min.  Sit to side to supine w/max assist and verbal cues for sequencing.  PA in to see pt. Pt left supine w/rails up x 3, alarm set, bed in lowest position, and needs in reach.   PM Session:   Pain - denies pain this pm Pt initially supine and agreeable to rx.  Demonstrated use of steady for STS and pt agreed to attempt stating "I am going to need help".  Supine to r side w/max assist, side to sit w/max assist,  scoots to edge in sitting w/max assist and verbal/tactile cues for wt shifting/sequencing.   Pt sat x 3 min while therapist positioned steady. STS w/bed elevated w/stedy w/max assist of 2.  Stood x 15 sec before requring rest in semisitting in stedy.  HR 100, 02sats 100%, BP 135/60 Repeated STS but pt unable to achieve upright w/max assist of 2, transferred to sitting on edge of bed w/max assist.  Pt stated he was fatigued. Pt encouraged to remain seated and agreed to goal of 5 min unsupported sitting, performed 3 reps LAQ, 10 reps ankle pumps and completed five min unsupported sitting. Sit to supine w/max assist of 1 for LE mgmnt and verbal cues for sequencing. Supine therex including: Glut sets 2x10 TKE's 2x10 Hip IR/ER 2x10 Hip abd/add w/therapist assist to prevent sheer to heels 2x15 bilat Pelvic tilts for abdominal strengthening 2x10 w/3 sec holds. Pt repositioned for comfort and pressure relief to bilat heels. Pt left supine w/rails up x 3, alarm set, bed in lowest position, and needs in reach. Pt with overall improved tolerance for activity and ability to participate in session.  Anxious and benefits from clear goals during session.   Discussed plan to attempt oob to wc 12/12, importance of oob activity, and pt verbalized understanding.  Therapy/Group: Individual Therapy  Callie Fielding, PT   Pamala Hurry  Chauncey Cruel 04/11/2019, 2:55 PM

## 2019-04-11 NOTE — Progress Notes (Signed)
ANTICOAGULATION CONSULT NOTE - Follow Up Consult  Pharmacy Consult for Coumadin Indication: St Jude AVR + Afib  Allergies  Allergen Reactions  . Ace Inhibitors Swelling, Anxiety and Other (See Comments)    Reaction to Altace (ramipril) "Can tolerate Benicar".  Diffuse swelling Edema Elevated kidney functions      . Methoxy Polyethylene Glycol-Epoetin Beta Other (See Comments)    Not effective; low hbg made patient feel awful( lethargic)    . Ramipril Other (See Comments)    Elevated kidney functions   . Tape Rash    Prefers clear tape     Patient Measurements: Height: 6\' 2"  (188 cm) Weight: (!) 300 lb 3.2 oz (136.2 kg) IBW/kg (Calculated) : 82.2  Vital Signs: Temp: 98 F (36.7 C) (12/11 0355) Temp Source: Oral (12/11 0355) BP: 102/63 (12/11 0355) Pulse Rate: 86 (12/11 0355)  Labs: Recent Labs    04/08/19 1846 04/09/19 0546 04/10/19 1524 04/10/19 1525 04/10/19 1526 04/11/19 0616  HGB  --   --   --  7.3*  --   --   HCT  --   --   --  24.2*  --   --   PLT  --   --   --  549*  --   --   LABPROT  --  22.7* 24.1*  --   --  22.4*  INR  --  2.0* 2.2*  --   --  2.0*  CREATININE 5.36*  --   --   --  8.62*  --     Estimated Creatinine Clearance: 13 mL/min (A) (by C-G formula based on SCr of 8.62 mg/dL (H)).  Assessment: Warfarin PTA for hx St Jude AVR + Afib. Range should be 2.5-3.5 given both of these; however, per discussion with patient, he was told to aim for 2.5 mid 2019 by MD - will aim for 2.5-3 in CIR. Pt noted to have recent GIB.  PTA warfarin dose: 5 mg daily  INR today remains SUBtherapeutic (INR 2 << 2.2, goal of 2.5-3). The patient received 1 unit of PRBC with HD yesterday for Hgb 7.3 - no repeat CBC yet today. No active bleeding noted.   Goal of Therapy:  INR 2.5-3 Monitor platelets by anticoagulation protocol: Yes   Plan:  - Warfarin 7.5 mg x 1 dose at 1800 today - Daily PT/INR, CBC q72h - Will continue to monitor for any signs/symptoms of  bleeding and will follow up with PT/INR in the a.m.    Thank you for allowing pharmacy to be a part of this patient's care.  Alycia Rossetti, PharmD, BCPS Clinical Pharmacist Clinical phone for 04/11/2019: [phone] 04/11/2019 9:18 AM   **Pharmacist phone directory can now be found on [email or web address] ([email or web address]).  Listed under [place].

## 2019-04-12 ENCOUNTER — Inpatient Hospital Stay (HOSPITAL_COMMUNITY): Payer: Medicare Other

## 2019-04-12 LAB — CBC
HCT: 27.3 % — ABNORMAL LOW (ref 39.0–52.0)
Hemoglobin: 8.3 g/dL — ABNORMAL LOW (ref 13.0–17.0)
MCH: 28.5 pg (ref 26.0–34.0)
MCHC: 30.4 g/dL (ref 30.0–36.0)
MCV: 93.8 fL (ref 80.0–100.0)
Platelets: 546 10*3/uL — ABNORMAL HIGH (ref 150–400)
RBC: 2.91 MIL/uL — ABNORMAL LOW (ref 4.22–5.81)
RDW: 17.1 % — ABNORMAL HIGH (ref 11.5–15.5)
WBC: 17.3 10*3/uL — ABNORMAL HIGH (ref 4.0–10.5)
nRBC: 0 % (ref 0.0–0.2)

## 2019-04-12 LAB — RENAL FUNCTION PANEL
Albumin: 2 g/dL — ABNORMAL LOW (ref 3.5–5.0)
Anion gap: 13 (ref 5–15)
BUN: 47 mg/dL — ABNORMAL HIGH (ref 8–23)
CO2: 24 mmol/L (ref 22–32)
Calcium: 8.9 mg/dL (ref 8.9–10.3)
Chloride: 98 mmol/L (ref 98–111)
Creatinine, Ser: 6.77 mg/dL — ABNORMAL HIGH (ref 0.61–1.24)
GFR calc Af Amer: 9 mL/min — ABNORMAL LOW (ref >60)
GFR calc non Af Amer: 8 mL/min — ABNORMAL LOW (ref >60)
Glucose, Bld: 120 mg/dL — ABNORMAL HIGH (ref 70–99)
Phosphorus: 4.9 mg/dL — ABNORMAL HIGH (ref 2.5–4.6)
Potassium: 3.8 mmol/L (ref 3.5–5.1)
Sodium: 135 mmol/L (ref 135–145)

## 2019-04-12 LAB — PROTIME-INR
INR: 2.2 — ABNORMAL HIGH (ref 0.8–1.2)
Prothrombin Time: 24.6 seconds — ABNORMAL HIGH (ref 11.4–15.2)

## 2019-04-12 MED ORDER — HEPARIN SODIUM (PORCINE) 1000 UNIT/ML IJ SOLN
INTRAMUSCULAR | Status: AC
  Filled 2019-04-12: qty 2

## 2019-04-12 MED ORDER — ALBUMIN HUMAN 25 % IV SOLN
INTRAVENOUS | Status: AC
  Administered 2019-04-12: 25 g via INTRAVENOUS
  Filled 2019-04-12: qty 100

## 2019-04-12 MED ORDER — HEPARIN SODIUM (PORCINE) 1000 UNIT/ML IJ SOLN
INTRAMUSCULAR | Status: AC
  Administered 2019-04-12: 4000 [IU]
  Filled 2019-04-12: qty 4

## 2019-04-12 MED ORDER — CALCITRIOL 0.25 MCG PO CAPS
ORAL_CAPSULE | ORAL | Status: AC
  Administered 2019-04-12: 0.25 ug via ORAL
  Filled 2019-04-12: qty 1

## 2019-04-12 MED ORDER — ALBUMIN HUMAN 25 % IV SOLN: 25.0000 g | Freq: Once | INTRAVENOUS | Status: AC

## 2019-04-12 MED ORDER — WARFARIN SODIUM 7.5 MG PO TABS
7.5000 mg | ORAL_TABLET | Freq: Once | ORAL | Status: AC
  Administered 2019-04-12: 7.5 mg via ORAL
  Filled 2019-04-12: qty 1

## 2019-04-12 NOTE — Plan of Care (Signed)
  Problem: Consults Goal: RH GENERAL PATIENT EDUCATION Description: See Patient Education module for education specifics. Outcome: Progressing Goal: Skin Care Protocol Initiated - if Braden Score 18 or less Description: If consults are not indicated, leave blank or document N/A Outcome: Progressing   Problem: RH BOWEL ELIMINATION Goal: RH STG MANAGE BOWEL WITH ASSISTANCE Description: STG Manage Bowel with Mod Assistance. Outcome: Progressing Goal: RH STG MANAGE BOWEL W/MEDICATION W/ASSISTANCE Description: STG Manage Bowel with Medication with Mod Assistance. Outcome: Progressing   Problem: RH BLADDER ELIMINATION Goal: RH STG MANAGE BLADDER WITH ASSISTANCE Description: STG Manage Bladder With Mod Assistance Outcome: Progressing   Problem: RH SKIN INTEGRITY Goal: RH STG SKIN FREE OF INFECTION/BREAKDOWN Description: No new breakdown with min assist  Outcome: Progressing Goal: RH STG ABLE TO PERFORM INCISION/WOUND CARE W/ASSISTANCE Description: STG Able To Perform Incision/Wound Care With Max Assistance. Outcome: Progressing   Problem: RH PAIN MANAGEMENT Goal: RH STG PAIN MANAGED AT OR BELOW PT'S PAIN GOAL Description: < 3 out of 10.  Outcome: Progressing

## 2019-04-12 NOTE — Progress Notes (Signed)
ANTICOAGULATION CONSULT NOTE - Follow Up Consult  Pharmacy Consult for Coumadin Indication: St Jude AVR + Afib  Allergies  Allergen Reactions  . Ace Inhibitors Swelling, Anxiety and Other (See Comments)    Reaction to Altace (ramipril) "Can tolerate Benicar".  Diffuse swelling Edema Elevated kidney functions      . Methoxy Polyethylene Glycol-Epoetin Beta Other (See Comments)    Not effective; low hbg made patient feel awful( lethargic)    . Ramipril Other (See Comments)    Elevated kidney functions   . Tape Rash    Prefers clear tape     Patient Measurements: Height: 6\' 2"  (188 cm) Weight: (!) 306 lb 7 oz (139 kg) IBW/kg (Calculated) : 82.2  Vital Signs: Temp: 97.5 F (36.4 C) (12/12 0448) Temp Source: Oral (12/12 0448) BP: 132/55 (12/12 0448) Pulse Rate: 92 (12/12 0448)  Labs: Recent Labs    04/10/19 1524 04/10/19 1525 04/10/19 1526 04/11/19 0616 04/12/19 0525  HGB  --  7.3*  --   --   --   HCT  --  24.2*  --   --   --   PLT  --  549*  --   --   --   LABPROT 24.1*  --   --  22.4* 24.6*  INR 2.2*  --   --  2.0* 2.2*  CREATININE  --   --  8.62*  --   --     Estimated Creatinine Clearance: 13.2 mL/min (A) (by C-G formula based on SCr of 8.62 mg/dL (H)).  Assessment: Warfarin PTA for hx St Jude AVR + Afib. Range should be 2.5-3.5 given both of these; however, per discussion with patient, he was told to aim for 2.5 mid 2019 by MD - will aim for 2.5-3 in CIR. Pt noted to have recent GIB.  PTA warfarin dose: 5 mg daily  INR today remains SUBtherapeutic (INR 2.2 << 2.2, goal of 2.5-3). No active bleeding noted. Did receive blood with HD recently. Vitals stable.    Goal of Therapy:  INR 2.5-3 Monitor platelets by anticoagulation protocol: Yes   Plan:  - Warfarin 7.5 mg x 1 dose at 1800 today - Daily PT/INR, CBC q72h ( Monday) - Will continue to monitor for any signs/symptoms of bleeding and will follow up with PT/INR in the a.m.    Thank you for  allowing pharmacy to be a part of this patient's care.  Duanne Limerick , PharmD, BCPS Clinical Pharmacist Clinical phone for 04/12/2019: [phone] 04/12/2019 11:30 AM   **Pharmacist phone directory can now be found on [email or web address] ([email or web address]).  Listed under [place].

## 2019-04-12 NOTE — Progress Notes (Signed)
[place] PHYSICAL MEDICINE & REHABILITATION PROGRESS NOTE   Subjective/Complaints: Pt feels that he "surpassed" his goals for yesterday. In good spirits today  ROS: Patient denies fever, rash, sore throat, blurred vision, nausea, vomiting, diarrhea, cough, shortness of breath or chest pain,   headache, or mood change.   Objective:   No results found. Recent Labs    04/10/19 1525  WBC 12.6*  HGB 7.3*  HCT 24.2*  PLT 549*   Recent Labs    04/10/19 1526  NA 137  K 4.1  CL 97*  CO2 25  GLUCOSE 108*  BUN 72*  CREATININE 8.62*  CALCIUM 8.5*    Intake/Output Summary (Last 24 hours) at 04/12/2019 1201 Last data filed at 04/12/2019 0758 Gross per 24 hour  Intake 490 ml  Output --  Net 490 ml     Physical Exam: Vital Signs Blood pressure (!) 132/55, pulse 92, temperature (!) 97.5 F (36.4 C), temperature source Oral, resp. rate 16, height 6\' 2"  (1.88 m), weight (!) 139 kg, SpO2 94 %. Constitutional: No distress . Vital signs reviewed. HEENT: EOMI, oral membranes moist Neck: supple Cardiovascular: RRR without murmur. No JVD    Respiratory: CTA Bilaterally without wheezes or rales. Normal effort    GI: BS +, non-tender, non-distended  Musculoskeletal:        General: Edema present LE.1+     Comments: left shoulder limited by pain Neurological: He is alert and oriented to person, place, and time.  MMT 2-3/5 LE's, 3/5 UE. Effort issues Skin: Skin is warm and dry.  Multple toes with scabbed areas--due to walker injury. Large left heel wound with eschar, no drainage. Right heel with small lateral blister. Macerated, blistered areas in gluteal folds-- no changes Psych: alert and appropriate   Assessment/Plan: 1. Functional deficits secondary to debility which require 3+ hours per day of interdisciplinary therapy in a comprehensive inpatient rehab setting.  Physiatrist is providing close team supervision and 24 hour management of active medical problems listed  below.  Physiatrist and rehab team continue to assess barriers to discharge/monitor patient progress toward functional and medical goals  Care Tool:  Bathing  Bathing activity did not occur: Refused           Bathing assist       Upper Body Dressing/Undressing Upper body dressing   What is the patient wearing?: Pull over shirt    Upper body assist Assist Level: Supervision/Verbal cueing    Lower Body Dressing/Undressing Lower body dressing      What is the patient wearing?: Incontinence brief     Lower body assist Assist for lower body dressing: 2 Helpers     Toileting Toileting    Toileting assist Assist for toileting: 2 Helpers     Transfers Chair/bed transfer  Transfers assist  Chair/bed transfer activity did not occur: Safety/medical concerns  Chair/bed transfer assist level: 2 Helpers     Locomotion Ambulation   Ambulation assist   Ambulation activity did not occur: Safety/medical concerns          Walk 10 feet activity   Assist  Walk 10 feet activity did not occur: Safety/medical concerns        Walk 50 feet activity   Assist Walk 50 feet with 2 turns activity did not occur: Safety/medical concerns         Walk 150 feet activity   Assist Walk 150 feet activity did not occur: Safety/medical concerns         Walk  10 feet on uneven surface  activity   Assist Walk 10 feet on uneven surfaces activity did not occur: Safety/medical Armed forces technical officer activity did not occur: Safety/medical concerns         Wheelchair 50 feet with 2 turns activity    Assist    Wheelchair 50 feet with 2 turns activity did not occur: Safety/medical concerns       Wheelchair 150 feet activity     Assist  Wheelchair 150 feet activity did not occur: Safety/medical concerns       Blood pressure (!) 132/55, pulse 92, temperature (!) 97.5 F (36.4 C), temperature source Oral,  resp. rate 16, height 6\' 2"  (1.88 m), weight (!) 139 kg, SpO2 94 %.  Medical Problem List and Plan: 1.  Impaired mobility and ADLs secondary to deibility             -patient may shower             -ELOS/Goals: modI in PT, OT, I in SLP  -reduced to 15/7.  I have reviewed expectations from activity standpoint with pt and wife. Pt states he's trying hard. Working on pacing/rest breaks, etc 2.  St Jude's Aortic Valve/Antithrombotics: -DVT/anticoagulation:  Pharmaceutical: Coumadin (INR 2.0)             -antiplatelet therapy: N/A 3. Pain Management: Oxycodone prn 4. Mood: LCSW to follow for evaluation and support.              -antipsychotic agents: N/A  -anxiety disorder/ recent MDD: reduce scheduled xanax to 0.25mg  tid   -have discussed relaxation and breathing techniques with pt   -appreciate neuropsych visit 5. Neuropsych: This patient is capable of making decisions on his own behalf. 6. Skin/Wound Care: Air mattress overlay for MASD due to ongoing diarrhea.     -continue EPC cream to form a protective barrier on buttocks   -rx loose stools  -prevalon boots bilateral heels 7. Fluids/Electrolytes/Nutrition: Strict I/O. Renal diet with 1200 cc FR.  8. ESRD: Now on HD with attempts to get off 2 L. Schedule hemodialysis at the end of the day to help with therapy tolerance. TTS schedule   12/10: diet liberalized to help with intake  -volume/edema mgt per nephrology Filed Weights   04/10/19 1505 04/11/19 0500 04/12/19 0448  Weight: (!) 141 kg (!) 136.2 kg (!) 139 kg     9. Hypotension: Monitor BP tid--continue midodrine. 10 History of A fib/A flutter: Monitor HR t--on coumadin. Continue Lopressor 12.5mg  BID 11. Recent GIB/Anemia of chronic disease: Continue to monitor H/H with serial checks. On Retacrit 10,000 units with HD.   12/11: hgb/tranfusion per nephrology 1u --feels better but still "weak" 12. Loose stool: C diff negative  -continue fiber  -increased probiotic to 500mg    Bid  -diet  -prn lomotil  -stools   improving    13. Cough with phlegm: Guaifenesin prn 14. Shortness of breath at rest: encouraged use of incentive spirometer.     -CXR doesn't show obvious fluid overload but does have atelectasis and ?inflitrate and effusion right base   -wbc's sl up to 12.6 12/10   -hesitate initiating abx given diarrhea   -weights are stable    -suspect a significant portion of SOB and fatigue is mood-related    -reduced xanax as above   -recheck labs monday LOS: 5 days A FACE TO FACE EVALUATION WAS  PERFORMED  Meredith Staggers 04/12/2019, 12:01 PM

## 2019-04-12 NOTE — Progress Notes (Signed)
Physical Therapy Session Note  Patient Details  Name: Calvin [name] MRN: [id number] Date of Birth: 1955-06-24  Today's Date: 04/12/2019 PT Individual Time: 1006-1059 PT Individual Time Calculation (min): 53 min   Short Term Goals: Week 1:  PT Short Term Goal 1 (Week 1): Pt will initiate OOB mobility PT Short Term Goal 2 (Week 1): Pt will perform bed mobility w/ mod assist +1 PT Short Term Goal 3 (Week 1): Pt will initiate w/c propulsion Week 2:    Week 3:     Skilled Therapeutic Interventions/Progress Updates:      Therapy Documentation Precautions:  Precautions Precautions: Fall Precaution Comments: monitor HR, L heel wound Restrictions Weight Bearing Restrictions: No  PAIN  Denies pain this am  Pt seen bedside for rx.  Initially sleeping and slow to arouse.  Discussed plan for use of Steady w/goal to be OOB in wc for short period w/return to bed via Steady.  Pt initially agreed.   Supine to side w/min assist, side to side on elbow w/min assist, pt stating "I am tired, I cannot do this today".  Encouraged pt to continue to sitting, completed side on elbow to sit w/max assist.  Pt sat several min but declined transition to Steady/wc. Encouraged pt to possibly try just standing in Steady as alternative to transferring to wc but pt declined and insisted he needed to lie down.  Stated he was tired and also confirmed he was scared.  When asked what his fear was he stated he was scared he wouldn't be able to do it.  Encouraged pt to sit at edge of bed but therapist was not able to convince pt despite extensive encouragement/support/discussion/education of goals/alternatives offered. Sit to supine w/max assist for LE management, cues for sequencing.   Pt would agree to therex in supine.  The following performed;  pt required frequent verbal cues to stay awake/attend to task - (tech assisting w/opposite leg to allow for continuous motion) Bridging x 15 w/therapist stabilizing feet bilat LE  AAROM bicycles 44min x1, 2 min x2, 1.5 min x1 for LE strengthening and cardiovascular conditioning  bilat LE "snow angels" hip abd/add x 2 min, x 1.5 min for strengthening/cardiovascular challenge. General strengthening: bilat TKE 2x10 bilat shoulder protraction (alternating reaching towards ceiling) to promote use of obliques/cardiovascular conditioning.  1 min x 2, 15 sec x 1.  Pt requires multiple rest breaks between activities. Unable to tolerate standing or planned attempted transfer OOB to wc today. Pt left supine w/rails up x 3, alarm set, bed in lowest position, and needs in reach.  Therapy/Group: Individual Therapy  Callie Fielding, [place] 04/12/2019, 3:43 PM

## 2019-04-12 NOTE — Progress Notes (Addendum)
Calvin [name] Progress Note   Dialysis Orders: Previously on PD -> transitioned recently to HD after episode peritonitis.  Non CKA pt  Assessment/Plan: 1.Debility:PT/OT per CIR. 2. ESRD:PD cath removed 11/16 at another facility. Continue on HD TTS schedulehere. + initialdiffuse dependentedema, needs volume off. Next HD today - draw labs pre HD run on 3 K pending lab results  3.HTN/volume:BP stable. Anasarca improving, and R pleural effusion on CXR 12/09.On midodrine 10mg  TID. 4L off on 12/10 (not doc in chart).  4. Anemia:Hgb 7.3 and symptomatic 12/10 -> now s/p 1U PRBC, Aranesp 252mcg q Thurs (his usual dose), and started course IV iron. Hx recent GIB - stable gastric ulcer, on PPI. No melena. Repeat CBC pre HD  5. Secondary hyperparathyroidism:CorrCa ok, Phos ^. Continue VDRA, increase binder. 6. Nutrition:Alb exceptionally low 1.9 - continue pro-stat supplements. 7. Hx mechanical AoV, A-fib/flutter:Warfarin per pharmacy - INR 2.2 -  8. T2DM 9. Hx testicular cancer   Calvin Jacobson, PA-C [place] 04/12/2019,10:25 AM  LOS: 5 days   Pt seen, examined and agree w A/P as above.  Calvin Splinter  MD 04/12/2019, 12:44 PM    Subjective:  Fatigued - working with PT.  Doesn't want chair dialysis today.    Objective Vitals:   04/11/19 0500 04/11/19 1439 04/11/19 1912 04/12/19 0448  BP:  (!) 143/38 122/75 (!) 132/55  Pulse:  88 76 92  Resp:  20 16 16   Temp:  97.6 F (36.4 C) 97.9 F (36.6 C) (!) 97.5 F (36.4 C)  TempSrc:    Oral  SpO2:  95% 97% 94%  Weight: (!) 136.2 kg   (!) 139 kg  Height:       Physical Exam  Supine in bed General: flat affect obese breathing easily supinein bed  Heart: RRR + valve click Lungs: dim BS grossly clear Abdomen: morbidly obese most excess fluid is dependent hip/flank   wound site clear Extremities: tr LE edema Dialysis Access:  Right IJ [facility]   Additional Objective Labs: Basic  Metabolic Panel: Recent Labs  Lab 04/08/19 1846 04/10/19 1526  NA 135 137  K 3.6 4.1  CL 96* 97*  CO2 26 25  GLUCOSE 106* 108*  BUN 43* 72*  CREATININE 5.36* 8.62*  CALCIUM 7.9* 8.5*  PHOS  --  6.9*   Liver Function Tests: Recent Labs  Lab 04/10/19 1526  ALBUMIN 1.9*   No results for input(s): LIPASE, AMYLASE in the last 168 hours. CBC: Recent Labs  Lab 04/08/19 0620 04/10/19 1525  WBC 10.4 12.6*  HGB 7.6* 7.3*  HCT 25.5* 24.2*  MCV 95.5 95.7  PLT 540* 549*   Blood Culture No results found for: SDES, SPECREQUEST, CULT, REPTSTATUS  Cardiac Enzymes: No results for input(s): CKTOTAL, CKMB, CKMBINDEX, TROPONINI in the last 168 hours. CBG: No results for input(s): GLUCAP in the last 168 hours. Iron Studies:  Recent Labs    04/09/19 1432  IRON 25*  TIBC 160*  FERRITIN 1,384*   Lab Results  Component Value Date   INR 2.2 (H) 04/12/2019   INR 2.0 (H) 04/11/2019   INR 2.2 (H) 04/10/2019   PROTIME 21.6 (H) 05/26/2014   PROTIME 26.4 (H) 05/08/2014   PROTIME 31.2 (H) 01/26/2014   Studies/Results: No results found. Medications: . ferric gluconate (FERRLECIT/NULECIT) IV 125 mg (04/10/19 1751)   . sodium chloride   Intravenous Once  . ALPRAZolam  0.25 mg Oral TID  . calcitRIOL  0.25 mcg Oral Q T,Th,Sa-HD  .  Chlorhexidine Gluconate Cloth  6 each Topical Daily  . darbepoetin (ARANESP) injection - DIALYSIS  200 mcg Intravenous Q Thu-HD  . feeding supplement (PRO-STAT SUGAR FREE 64)  30 mL Oral BID  . liver oil-zinc oxide   Topical TID  . metoprolol tartrate  12.5 mg Oral BID  . midodrine  10 mg Oral TID WC  . multivitamin  1 tablet Oral QHS  . pantoprazole  40 mg Oral BID  . polycarbophil  625 mg Oral TID WC  . saccharomyces boulardii  500 mg Oral BID  . sertraline  50 mg Oral Daily  . sevelamer carbonate  1,600 mg Oral TID WC  . Warfarin - Pharmacist Dosing Inpatient   Does not apply [phone]

## 2019-04-12 NOTE — Progress Notes (Signed)
Occupational Therapy Session Note  Patient Details  Name: Calvin [name] MRN: [id number] Date of Birth: Jul 31, 1955  Today's Date: 04/12/2019 OT Individual Time: 0800-0900 OT Individual Time Calculation (min): 60 min    Short Term Goals: Week 1:  OT Short Term Goal 1 (Week 1): Pt will don UB clothing EOB with mod A OT Short Term Goal 2 (Week 1): Pt will transfer to EOB with mod A OT Short Term Goal 3 (Week 1): Pt will reduce anxiety during OT, evidenced by maintaining HR under 130 bpm during bed mobility  Skilled Therapeutic Interventions/Progress Updates:    1:1. Pt received in bed agreeable to OT. Pt with no report of pain only fatigue as pt did not sleep well. Pt completes rolling B with mod-max A of 1 with use of bed pad to create more space in bed for hips. OT completes peri care for small BM in brief. Pt given rest break after rolling and OT obtains cushion for in w/c to incease height and sitting tolerance. Pt agreeable to seated EOB grooming and dressing for 15 min this date with MAX encouragement to stay up for last 5 min with reminder of improving goals. Cervical extension and scap protraction exercises completed EOB to improve upright posture. Exited session with pt seated in bed, exit alarm on and call light tin reach  Therapy Documentation Precautions:  Precautions Precautions: Fall Precaution Comments: monitor HR, L heel wound Restrictions Weight Bearing Restrictions: No General:   Vital Signs: Therapy Vitals Temp: (!) 97.5 F (36.4 C) Temp Source: Oral Pulse Rate: 92 Resp: 16 BP: (!) 132/55 Patient Position (if appropriate): Lying Oxygen Therapy SpO2: 94 % O2 Device: Room Air Pain:   ADL: ADL Eating: Supervision/safety Where Assessed-Eating: Bed level Grooming: Supervision/safety Where Assessed-Grooming: Bed level Upper Body Bathing: Moderate assistance Where Assessed-Upper Body Bathing: Bed level Lower Body Bathing: Dependent Where Assessed-Lower Body  Bathing: Bed level Upper Body Dressing: Maximal assistance Where Assessed-Upper Body Dressing: Edge of bed Lower Body Dressing: Dependent Where Assessed-Lower Body Dressing: Bed level Toileting: Dependent Where Assessed-Toileting: Bed level Toilet Transfer: Unable to assess Vision   Perception    Praxis   Exercises:   Other Treatments:     Therapy/Group: Individual Therapy  Tonny Branch 04/12/2019, 6:58 AM

## 2019-04-12 NOTE — Progress Notes (Signed)
Patient's family called and voiced concerns regarding blood work. Called and spoke with Naaman Plummer, MD. New orders received, will continue to monitor. Will inform night nurse.  [place]

## 2019-04-13 ENCOUNTER — Inpatient Hospital Stay (HOSPITAL_COMMUNITY): Payer: Medicare Other

## 2019-04-13 LAB — CBC
HCT: 27.2 % — ABNORMAL LOW (ref 39.0–52.0)
Hemoglobin: 8.2 g/dL — ABNORMAL LOW (ref 13.0–17.0)
MCH: 28.5 pg (ref 26.0–34.0)
MCHC: 30.1 g/dL (ref 30.0–36.0)
MCV: 94.4 fL (ref 80.0–100.0)
Platelets: 550 10*3/uL — ABNORMAL HIGH (ref 150–400)
RBC: 2.88 MIL/uL — ABNORMAL LOW (ref 4.22–5.81)
RDW: 17.2 % — ABNORMAL HIGH (ref 11.5–15.5)
WBC: 15.6 10*3/uL — ABNORMAL HIGH (ref 4.0–10.5)
nRBC: 0 % (ref 0.0–0.2)

## 2019-04-13 LAB — PROTIME-INR
INR: 2.7 — ABNORMAL HIGH (ref 0.8–1.2)
Prothrombin Time: 28.6 seconds — ABNORMAL HIGH (ref 11.4–15.2)

## 2019-04-13 MED ORDER — WARFARIN SODIUM 5 MG PO TABS
5.0000 mg | ORAL_TABLET | Freq: Once | ORAL | Status: AC
  Administered 2019-04-13: 5 mg via ORAL
  Filled 2019-04-13: qty 1

## 2019-04-13 MED ORDER — ALPRAZOLAM 0.5 MG PO TABS
0.5000 mg | ORAL_TABLET | Freq: Three times a day (TID) | ORAL | Status: DC
  Administered 2019-04-13 – 2019-05-09 (×72): 0.5 mg via ORAL
  Filled 2019-04-13 (×74): qty 1

## 2019-04-13 MED ORDER — PREGABALIN 25 MG PO CAPS
25.0000 mg | ORAL_CAPSULE | Freq: Every day | ORAL | Status: DC
  Administered 2019-04-13 – 2019-05-08 (×25): 25 mg via ORAL
  Filled 2019-04-13 (×25): qty 1

## 2019-04-13 MED ORDER — MEGESTROL ACETATE 400 MG/10ML PO SUSP
400.0000 mg | Freq: Two times a day (BID) | ORAL | Status: DC
  Administered 2019-04-13 – 2019-04-18 (×11): 400 mg via ORAL
  Filled 2019-04-13 (×11): qty 10

## 2019-04-13 NOTE — Progress Notes (Addendum)
[name] Progress Note   Dialysis Orders: Previously on PD -> transitioned recently to HD after episode peritonitis.  Non CKA pt  Assessment/Plan: 1.Debility:PT/OT per CIR. 2. ESRD:PD cath removed 11/16 at another facility. Continue on HD TTS schedulehere. Volume improving  3.HTN/volume:BP stable. Anasarca improving, and R pleural effusion on CXR 12/09.On midodrine 10mg  TID. 4L off on 12/10 and 3.6 12/12 to 134.2  4. Anemia:Hgb 7.3 and symptomatic 12/10 -> now s/p 1U PRBC, Aranesp 243mcg q Thurs (his usual dose), and started course IV iron. Hx recent GIB - stable gastric ulcer, on PPI. No melena.hgb up to 8.3  5. Secondary hyperparathyroidism:CorrCa/P ok Continue VDRA binder. 6. Nutrition:Alb  2 continue pro-stat supplements. 7. Hx mechanical AoV, A-fib/flutter:Warfarin per pharmacy - INR 2.2 -  8. T2DM - per primary 9. Hx testicular cancer 10. Anxiety/depression - I believe some of SOB is related to anxiety - encouraged him to ask to talk to CIR psychologist again - on zoloft - may need to adjust meds  11. Leukocytosis - unexplained - does have TDC - get BC x 2 - though no fevers   [name], PA-C [facility] [phone] 04/13/2019,10:49 AM  LOS: 6 days    Subjective:  C/o can't catch his breath - breaths shallowly - sats on on room air.  Neg CXR yesterday.  Couldn't sleep last night. Anxious   Objective Vitals:   04/12/19 1600 04/12/19 1610 04/12/19 1937 04/13/19 0327  BP: (!) 102/50 (!) 116/42 (!) 113/47 (!) 104/57  Pulse: (!) 105 (!) 106 79 81  Resp:  16 18 18   Temp:  98.2 F (36.8 C) 97.8 F (36.6 C) 98 F (36.7 C)  TempSrc:  Oral Oral Oral  SpO2:  98% 94% 97%  Weight:  133.5 kg  134.2 kg  Height:       Physical Exam  Supine in bed General: flat affect obese breathing easily supine in bed  Heart: RRR + valve click Lungs: dim BS grossly clear Abdomen: morbidly obese most excess fluid is dependent hip/flank    wound site clear Extremities: tr LE edema Dialysis Access:  Right IJ [facility]   Additional Objective Labs: Basic Metabolic Panel: Recent Labs  Lab 04/08/19 1846 04/10/19 1526 04/12/19 1237  NA 135 137 135  K 3.6 4.1 3.8  CL 96* 97* 98  CO2 26 25 24   GLUCOSE 106* 108* 120*  BUN 43* 72* 47*  CREATININE 5.36* 8.62* 6.77*  CALCIUM 7.9* 8.5* 8.9  PHOS  --  6.9* 4.9*   Liver Function Tests: Recent Labs  Lab 04/10/19 1526 04/12/19 1237  ALBUMIN 1.9* 2.0*   No results for input(s): LIPASE, AMYLASE in the last 168 hours. CBC: Recent Labs  Lab 04/08/19 0620 04/10/19 1525 04/12/19 1237 04/13/19 0550  WBC 10.4 12.6* 17.3* 15.6*  HGB 7.6* 7.3* 8.3* 8.2*  HCT 25.5* 24.2* 27.3* 27.2*  MCV 95.5 95.7 93.8 94.4  PLT 540* 549* 546* 550*   Blood Culture No results found for: SDES, SPECREQUEST, CULT, REPTSTATUS  Cardiac Enzymes: No results for input(s): CKTOTAL, CKMB, CKMBINDEX, TROPONINI in the last 168 hours. CBG: No results for input(s): GLUCAP in the last 168 hours. Iron Studies:  No results for input(s): IRON, TIBC, TRANSFERRIN, FERRITIN in the last 72 hours. Lab Results  Component Value Date   INR 2.7 (H) 04/13/2019   INR 2.2 (H) 04/12/2019   INR 2.0 (H) 04/11/2019   PROTIME 21.6 (H) 05/26/2014   PROTIME 26.4 (H) 05/08/2014  PROTIME 31.2 (H) 01/26/2014   Studies/Results: DG Chest 2 View NOW  Result Date: 04/12/2019 CLINICAL DATA:  Shortness of breath EXAM: CHEST - 2 VIEW COMPARISON:  04/09/2019 FINDINGS: Cardiac shadow remains enlarged. Distal aortic arch stent is noted related to the known history of coarctation of the aorta. Right jugular dialysis catheter is again seen. Platelike atelectasis is noted in right lung base with associated right-sided effusion. The overall appearance is stable. The left lung appears clear. Postsurgical changes in the left chest wall are noted. IMPRESSION: Stable appearance of the chest when compared with the prior study. No significant  edema is noted. Electronically Signed   By: Inez Catalina M.D.   On: 04/12/2019 19:20   Medications: . ferric gluconate (FERRLECIT/NULECIT) IV Stopped (04/12/19 1608)   . sodium chloride   Intravenous Once  . ALPRAZolam  0.5 mg Oral TID  . calcitRIOL  0.25 mcg Oral Q T,Th,Sa-HD  . Chlorhexidine Gluconate Cloth  6 each Topical Daily  . darbepoetin (ARANESP) injection - DIALYSIS  200 mcg Intravenous Q Thu-HD  . feeding supplement (PRO-STAT SUGAR FREE 64)  30 mL Oral BID  . liver oil-zinc oxide   Topical TID  . megestrol  400 mg Oral BID  . metoprolol tartrate  12.5 mg Oral BID  . midodrine  10 mg Oral TID WC  . multivitamin  1 tablet Oral QHS  . pantoprazole  40 mg Oral BID  . polycarbophil  625 mg Oral TID WC  . pregabalin  25 mg Oral QHS  . saccharomyces boulardii  500 mg Oral BID  . sertraline  50 mg Oral Daily  . sevelamer carbonate  1,600 mg Oral TID WC  . warfarin  5 mg Oral ONCE-1800  . Warfarin - Pharmacist Dosing Inpatient   Does not apply [phone]

## 2019-04-13 NOTE — Progress Notes (Signed)
ANTICOAGULATION CONSULT NOTE - Follow Up Consult  Pharmacy Consult for Coumadin Indication: St Jude AVR + Afib  Allergies  Allergen Reactions  . Ace Inhibitors Swelling, Anxiety and Other (See Comments)    Reaction to Altace (ramipril) "Can tolerate Benicar".  Diffuse swelling Edema Elevated kidney functions      . Methoxy Polyethylene Glycol-Epoetin Beta Other (See Comments)    Not effective; low hbg made patient feel awful( lethargic)    . Ramipril Other (See Comments)    Elevated kidney functions   . Tape Rash    Prefers clear tape     Patient Measurements: Height: 6\' 2"  (188 cm) Weight: 295 lb 13.7 oz (134.2 kg) IBW/kg (Calculated) : 82.2  Vital Signs: Temp: 98 F (36.7 C) (12/13 0327) Temp Source: Oral (12/13 0327) BP: 104/57 (12/13 0327) Pulse Rate: 81 (12/13 0327)  Labs: Recent Labs    04/10/19 1524 04/10/19 1525 04/10/19 1526 04/11/19 0616 04/12/19 0525 04/12/19 1237 04/13/19 0550  HGB   < > 7.3*  --   --   --  8.3* 8.2*  HCT  --  24.2*  --   --   --  27.3* 27.2*  PLT  --  549*  --   --   --  546* 550*  LABPROT  --   --   --  22.4* 24.6*  --  28.6*  INR  --   --   --  2.0* 2.2*  --  2.7*  CREATININE  --   --  8.62*  --   --  6.77*  --    < > = values in this interval not displayed.    Estimated Creatinine Clearance: 16.5 mL/min (A) (by C-G formula based on SCr of 6.77 mg/dL (H)).  Assessment: Warfarin PTA for hx St Jude AVR + Afib. Range should be 2.5-3.5 given both of these; however, per discussion with patient, he was told to aim for 2.5 mid 2019 by MD - will aim for 2.5-3 in CIR. Pt noted to have recent GIB.  PTA warfarin dose: 5 mg daily  INR today remains SUBtherapeutic (INR 2.2 << 2.2, goal of 2.5-3). No active bleeding noted. Did receive blood with HD recently. Likely related to INR decrease on 12/11 Vitals stable.    Goal of Therapy:  INR 2.5-3 Monitor platelets by anticoagulation protocol: Yes   Plan:  - Warfarin 5 mg x 1 dose  at 1800 today - Daily PT/INR, CBC q72h ( Monday) - Will continue to monitor for any signs/symptoms of bleeding and will follow up with PT/INR in the a.m.    Thank you for allowing pharmacy to be a part of this patient's care.  Duanne Limerick , PharmD, BCPS Clinical Pharmacist Clinical phone for 04/13/2019: [phone] 04/13/2019 10:14 AM   **Pharmacist phone directory can now be found on [email or web address] ([email or web address]).  Listed under [place].

## 2019-04-13 NOTE — Progress Notes (Addendum)
[facility] PHYSICAL MEDICINE & REHABILITATION PROGRESS NOTE   Subjective/Complaints: Didn't sleep at all last night. Was worried about cxr. Says he can't do anything today because of his lack of sleep. Admits that his anxiety gets the best of him. Has sob while he sleeps, afraid to close eyes  ROS: Patient denies fever, rash, sore throat, blurred vision, nausea, vomiting, diarrhea, cough,   chest pain, joint or back pain, headache .     Objective:   DG Chest 2 View NOW  Result Date: 04/12/2019 CLINICAL DATA:  Shortness of breath EXAM: CHEST - 2 VIEW COMPARISON:  04/09/2019 FINDINGS: Cardiac shadow remains enlarged. Distal aortic arch stent is noted related to the known history of coarctation of the aorta. Right jugular dialysis catheter is again seen. Platelike atelectasis is noted in right lung base with associated right-sided effusion. The overall appearance is stable. The left lung appears clear. Postsurgical changes in the left chest wall are noted. IMPRESSION: Stable appearance of the chest when compared with the prior study. No significant edema is noted. Electronically Signed   By: Inez Catalina M.D.   On: 04/12/2019 19:20   Recent Labs    04/12/19 1237 04/13/19 0550  WBC 17.3* 15.6*  HGB 8.3* 8.2*  HCT 27.3* 27.2*  PLT 546* 550*   Recent Labs    04/10/19 1526 04/12/19 1237  NA 137 135  K 4.1 3.8  CL 97* 98  CO2 25 24  GLUCOSE 108* 120*  BUN 72* 47*  CREATININE 8.62* 6.77*  CALCIUM 8.5* 8.9    Intake/Output Summary (Last 24 hours) at 04/13/2019 1035 Last data filed at 04/13/2019 0720 Gross per 24 hour  Intake 100 ml  Output 3636 ml  Net -3536 ml     Physical Exam: Vital Signs Blood pressure (!) 104/57, pulse 81, temperature 98 F (36.7 C), temperature source Oral, resp. rate 18, height 6\' 2"  (1.88 m), weight 134.2 kg, SpO2 97 %. Constitutional: No distress . Vital signs reviewed. HEENT: EOMI, oral membranes moist Neck: supple Cardiovascular: RRR without  murmur. No JVD    Respiratory: CTA Bilaterally without wheezes or rales. Small inspirations    GI: BS +, non-tender, non-distended, abd scars  Musculoskeletal:        General: Edema present LE.1+     Comments: left shoulder limited by pain Neurological: He is alert and oriented to person, place, and time.  MMT 2-3/5 LE's, 3/5 UE.  Skin: Skin is warm and dry.  Multple toes with scabbed areas--due to walker injury. Large left heel wound with eschar, no drainage. Right heel with small lateral blister. Macerated, blistered areas in gluteal folds--not visualized today Psych: anxious   Assessment/Plan: 1. Functional deficits secondary to debility which require 3+ hours per day of interdisciplinary therapy in a comprehensive inpatient rehab setting.  Physiatrist is providing close team supervision and 24 hour management of active medical problems listed below.  Physiatrist and rehab team continue to assess barriers to discharge/monitor patient progress toward functional and medical goals  Care Tool:  Bathing  Bathing activity did not occur: Refused           Bathing assist       Upper Body Dressing/Undressing Upper body dressing   What is the patient wearing?: Pull over shirt    Upper body assist Assist Level: Supervision/Verbal cueing    Lower Body Dressing/Undressing Lower body dressing      What is the patient wearing?: Incontinence brief     Lower body assist  Assist for lower body dressing: 2 Helpers     Chartered loss adjuster assist Assist for toileting: 2 Helpers     Transfers Chair/bed transfer  Transfers assist  Chair/bed transfer activity did not occur: Safety/medical concerns  Chair/bed transfer assist level: 2 Helpers     Locomotion Ambulation   Ambulation assist   Ambulation activity did not occur: Safety/medical concerns          Walk 10 feet activity   Assist  Walk 10 feet activity did not occur: Safety/medical  concerns        Walk 50 feet activity   Assist Walk 50 feet with 2 turns activity did not occur: Safety/medical concerns         Walk 150 feet activity   Assist Walk 150 feet activity did not occur: Safety/medical concerns         Walk 10 feet on uneven surface  activity   Assist Walk 10 feet on uneven surfaces activity did not occur: Safety/medical Armed forces technical officer activity did not occur: Safety/medical concerns         Wheelchair 50 feet with 2 turns activity    Assist    Wheelchair 50 feet with 2 turns activity did not occur: Safety/medical concerns       Wheelchair 150 feet activity     Assist  Wheelchair 150 feet activity did not occur: Safety/medical concerns       Blood pressure (!) 104/57, pulse 81, temperature 98 F (36.7 C), temperature source Oral, resp. rate 18, height 6\' 2"  (1.88 m), weight 134.2 kg, SpO2 97 %.  Medical Problem List and Plan: 1.  Impaired mobility and ADLs secondary to deibility             -patient may shower             -ELOS/Goals: modI in PT, OT, I in SLP  -reduced to 15/7.  I have reviewed expectations from activity standpoint with pt and wife. Pt states he's trying hard. Working on pacing/rest breaks, etc 2.  St Jude's Aortic Valve/Antithrombotics: -DVT/anticoagulation:  Pharmaceutical: Coumadin (INR 2.0)             -antiplatelet therapy: N/A 3. Pain Management: Oxycodone prn  -resume lyrica at hs only 25mg  4. Mood: LCSW to follow for evaluation and support.              -antipsychotic agents: N/A  -anxiety disorder/ recent MDD   -discussed with pt again today   -will increase xanax back to 0.5mg , discussed breathing techniques   -appreciate neuropsych visit 5. Neuropsych: This patient is capable of making decisions on his own behalf. 6. Skin/Wound Care: Air mattress overlay for MASD due to ongoing diarrhea.     -continue EPC cream to form a protective  barrier on buttocks   -rx loose stools  -prevalon boots bilateral heels 7. Fluids/Electrolytes/Nutrition: Strict I/O. Renal diet with 1200 cc FR.  8. ESRD: Now on HD with attempts to get off 2 L. Schedule hemodialysis at the end of the day to help with therapy tolerance. TTS schedule   12/10: diet liberalized to help with intake  -volume/edema mgt per nephrology  -will add megace for appetite Filed Weights   04/12/19 1200 04/12/19 1610 04/13/19 0327  Weight: (!) 139 kg 133.5 kg 134.2 kg     9. Hypotension: Monitor BP tid--continue midodrine. 10  History of A fib/A flutter: Monitor HR t--on coumadin. Continue Lopressor 12.5mg  BID 11. Recent GIB/Anemia of chronic disease: Continue to monitor H/H with serial checks. On Retacrit 10,000 units with HD.   12/11: hgb/tranfusion per nephrology 1u --8.2 12. Loose stool: C diff negative  -continue fiber  -increased probiotic to 500mg   Bid  -diet  -prn lomotil  -stools   improving    13. Cough with phlegm: Guaifenesin prn 14. Shortness of breath at rest: encouraged use of incentive spirometer.     -f/u cxr without changes    -weights are stable    -suspect a significant portion of SOB and fatigue is mood-related    -he agrees 15. Leukocytosis:  -WBC's up to 17k yesterday prior to HD, 15k today  -no other s/s infection at present, afebrile, spoke to nephrology  -continue to follow serially, check ua/ucx if we can get sample  -if further uptick tomorrow- will contact surgery  -reassured the patient that we are following closely LOS: 6 days A FACE TO FACE EVALUATION WAS PERFORMED  Meredith Staggers 04/13/2019, 10:35 AM

## 2019-04-13 NOTE — Progress Notes (Signed)
Physical Therapy Session Note  Patient Details  Name: Calvin [name] MRN: [id number] Date of Birth: July 25, 1955  Today's Date: 04/13/2019 PT Individual Time: 1502-1600 PT Individual Time Calculation (min): 58 min    Short Term Goals: Week 1:  PT Short Term Goal 1 (Week 1): Pt will initiate OOB mobility PT Short Term Goal 2 (Week 1): Pt will perform bed mobility w/ mod assist +1 PT Short Term Goal 3 (Week 1): Pt will initiate w/c propulsion  Skilled Therapeutic Interventions/Progress Updates:    Pt supine in bed upon PT arrival, agreeable to therapy tx and denies pain at rest. Pt agreeable to work on getting OOB this session and sitting in w/c, pt's wife present throughout session for education and providing motivation for the patient. Pt performed rolling in each direction with use of bedrails and mod assist while therapist donned pants and pulled pants over hips. Pt transferred to sitting EOB with mod assist, cues for techniques and use of bedrails. Lateral lean to place slideboard. Pt performed slideboard transfer this session with max assist +2 (from pt's wife), cues for hand placement and cues to try pushing through LE s. Pt worked on OOB activity tolerance by staying in the w/c this session for 30 minutes total to meet his goal, transported patient outside for therapeutic purposes, while outside discussed other therapy goals for the week, continuing to get up to w/c during therapy sessions and using time between therapies to perform therex on his own. Pt transported back to the unit. Pt worked on w/c propulsion, propelled w/c using B UE s with supervision x 3 ft and x 5 ft, pt reports "I can't push more" therapist providing encouragement throughout. Pt performed slideboard transfer back to bed this session with max assist +1 (from therapist only), pt with improved pushing through LE s during this transfer, cues for techniques. Pt performed sit>supine max assist for LE management. In bed pt  performed x 10 cross body reaches trying to lift shoulder blade off the bed to engage core muscles, cues for techniques and encouragement throughout. Pt left supine in bed at end of session, in care of wife.   Therapy Documentation Precautions:  Precautions Precautions: Fall Precaution Comments: monitor HR, L heel wound Restrictions Weight Bearing Restrictions: No   Therapy/Group: Individual Therapy  Netta Corrigan, PT, DPT, CSRS 04/13/2019, 1:03 PM

## 2019-04-13 NOTE — Progress Notes (Signed)
Occupational Therapy Session Note  Patient Details  Name: Calvin [name] MRN: [id number] Date of Birth: Dec 23, 1955  Today's Date: 04/13/2019 OT Individual Time: 0354-6568 OT Individual Time Calculation (min): 55 min    Short Term Goals: Week 1:  OT Short Term Goal 1 (Week 1): Pt will don UB clothing EOB with mod A OT Short Term Goal 2 (Week 1): Pt will transfer to EOB with mod A OT Short Term Goal 3 (Week 1): Pt will reduce anxiety during OT, evidenced by maintaining HR under 130 bpm during bed mobility  Skilled Therapeutic Interventions/Progress Updates:  Pt received in bed agreeable to OT, but declining OOB activity today as pt reporting not sleeping well. Discussed need to get OOB in order to assess best DME and predict function at home prior to first conference. Pt reporting feeling better with at least 1 male staff present for OOB mobility. Communicated with scheduling and reviewed the many opportunities to complete over the next 4 session. Pt with BM in brief and rolls with MOD A overall and VC for hooking LE over hospital bed rail to maintain positioning while cleansing buttocks/changing pads and linen. Pt completes supine>sitting EOB with MOD A and MAX VC for sequencing transitional movment. Pt sits EOB 9 min with S (established goal 17 min) while changing shirt. Pt attempting to bargain with OT stating, " stop pushing me; either change the goal to 15 or im laying down now." Edu re continueing to progress tolerance to upright and pt elects to lay down. Pt sit to supine with S bringing Les into bed and wiggling to center with use of bed rails. Exited session with pt setaed in bed exit alarm on and call light inr each.  Therapy Documentation Precautions:  Precautions Precautions: Fall Precaution Comments: monitor HR, L heel wound Restrictions Weight Bearing Restrictions: No General:   Vital Signs: Therapy Vitals Temp: 98 F (36.7 C) Temp Source: Oral Pulse Rate: 81 Resp: 18 BP:  (!) 104/57 Patient Position (if appropriate): Lying Oxygen Therapy SpO2: 97 % O2 Device: Room Air Pain:   ADL: ADL Eating: Supervision/safety Where Assessed-Eating: Bed level Grooming: Supervision/safety Where Assessed-Grooming: Bed level Upper Body Bathing: Moderate assistance Where Assessed-Upper Body Bathing: Bed level Lower Body Bathing: Dependent Where Assessed-Lower Body Bathing: Bed level Upper Body Dressing: Maximal assistance Where Assessed-Upper Body Dressing: Edge of bed Lower Body Dressing: Dependent Where Assessed-Lower Body Dressing: Bed level Toileting: Dependent Where Assessed-Toileting: Bed level Toilet Transfer: Unable to assess Vision   Perception    Praxis   Exercises:   Other Treatments:     Therapy/Group: Individual Therapy  Tonny Branch 04/13/2019, 6:58 AM

## 2019-04-13 NOTE — Progress Notes (Signed)
Patient stated he did not want to have blood drawn from the right arm anymore today because of several blood draws already. Patient states he wants blood drawn from his left arm and his restricted extremity bracelet cut off. Patient states he has had blood drawn from left arm many times with no complications and understands why restricted extremity bracelet was on.  Hector

## 2019-04-13 NOTE — Plan of Care (Signed)
  Problem: Consults Goal: RH GENERAL PATIENT EDUCATION Description: See Patient Education module for education specifics. Outcome: Progressing Goal: Skin Care Protocol Initiated - if Braden Score 18 or less Description: If consults are not indicated, leave blank or document N/A Outcome: Progressing   Problem: RH BOWEL ELIMINATION Goal: RH STG MANAGE BOWEL WITH ASSISTANCE Description: STG Manage Bowel with Mod Assistance. Outcome: Progressing Goal: RH STG MANAGE BOWEL W/MEDICATION W/ASSISTANCE Description: STG Manage Bowel with Medication with Mod Assistance. Outcome: Progressing   Problem: RH BLADDER ELIMINATION Goal: RH STG MANAGE BLADDER WITH ASSISTANCE Description: STG Manage Bladder With Mod Assistance Outcome: Progressing   Problem: RH SKIN INTEGRITY Goal: RH STG SKIN FREE OF INFECTION/BREAKDOWN Description: No new breakdown with min assist  Outcome: Progressing Goal: RH STG ABLE TO PERFORM INCISION/WOUND CARE W/ASSISTANCE Description: STG Able To Perform Incision/Wound Care With Max Assistance. Outcome: Progressing   Problem: RH PAIN MANAGEMENT Goal: RH STG PAIN MANAGED AT OR BELOW PT'S PAIN GOAL Description: < 3 out of 10.  Outcome: Progressing

## 2019-04-14 ENCOUNTER — Inpatient Hospital Stay (HOSPITAL_COMMUNITY): Payer: Medicare Other

## 2019-04-14 ENCOUNTER — Inpatient Hospital Stay (HOSPITAL_COMMUNITY): Payer: Medicare Other | Admitting: Physical Therapy

## 2019-04-14 LAB — CBC
HCT: 28.5 % — ABNORMAL LOW (ref 39.0–52.0)
Hemoglobin: 8.7 g/dL — ABNORMAL LOW (ref 13.0–17.0)
MCH: 28.7 pg (ref 26.0–34.0)
MCHC: 30.5 g/dL (ref 30.0–36.0)
MCV: 94.1 fL (ref 80.0–100.0)
Platelets: 565 10*3/uL — ABNORMAL HIGH (ref 150–400)
RBC: 3.03 MIL/uL — ABNORMAL LOW (ref 4.22–5.81)
RDW: 17.1 % — ABNORMAL HIGH (ref 11.5–15.5)
WBC: 14 10*3/uL — ABNORMAL HIGH (ref 4.0–10.5)
nRBC: 0 % (ref 0.0–0.2)

## 2019-04-14 LAB — PROTIME-INR
INR: 3.1 — ABNORMAL HIGH (ref 0.8–1.2)
Prothrombin Time: 32 seconds — ABNORMAL HIGH (ref 11.4–15.2)

## 2019-04-14 MED ORDER — WARFARIN SODIUM 5 MG PO TABS
5.0000 mg | ORAL_TABLET | Freq: Once | ORAL | Status: AC
  Administered 2019-04-14: 18:00:00 5 mg via ORAL
  Filled 2019-04-14: qty 1

## 2019-04-14 MED ORDER — CHLORHEXIDINE GLUCONATE CLOTH 2 % EX PADS: 6.0000 | MEDICATED_PAD | Freq: Every day | CUTANEOUS | Status: DC

## 2019-04-14 MED ORDER — SACCHAROMYCES BOULARDII 250 MG PO CAPS
500.0000 mg | ORAL_CAPSULE | Freq: Three times a day (TID) | ORAL | Status: DC
  Administered 2019-04-14 – 2019-05-09 (×64): 500 mg via ORAL
  Filled 2019-04-14 (×67): qty 2

## 2019-04-14 NOTE — Progress Notes (Signed)
ANTICOAGULATION CONSULT NOTE - Follow Up Consult  Pharmacy Consult for Coumadin Indication: St Jude AVR + Afib  Allergies  Allergen Reactions  . Ace Inhibitors Swelling, Anxiety and Other (See Comments)    Reaction to Altace (ramipril) "Can tolerate Benicar".  Diffuse swelling Edema Elevated kidney functions      . Methoxy Polyethylene Glycol-Epoetin Beta Other (See Comments)    Not effective; low hbg made patient feel awful( lethargic)    . Ramipril Other (See Comments)    Elevated kidney functions   . Tape Rash    Prefers clear tape     Patient Measurements: Height: 6\' 2"  (188 cm) Weight: 295 lb 13.7 oz (134.2 kg) IBW/kg (Calculated) : 82.2  Vital Signs: Temp: 98.2 F (36.8 C) (12/14 0612) Temp Source: Oral (12/14 0612) BP: 116/50 (12/14 0612) Pulse Rate: 44 (12/14 0612)  Labs: Recent Labs    04/12/19 0525 04/12/19 1237 04/13/19 0550 04/14/19 0714  HGB  --  8.3* 8.2* 8.7*  HCT  --  27.3* 27.2* 28.5*  PLT  --  546* 550* 565*  LABPROT 24.6*  --  28.6* 32.0*  INR 2.2*  --  2.7* 3.1*  CREATININE  --  6.77*  --   --     Estimated Creatinine Clearance: 16.5 mL/min (A) (by C-G formula based on SCr of 6.77 mg/dL (H)).  Assessment: Warfarin PTA for hx St Jude AVR + Afib. Range should be 2.5-3.5 given both of these; however, per discussion with patient, he was told to aim for 2.5 mid 2019 by MD - will aim for 2.5-3 in CIR. Pt noted to have recent GIB.  PTA warfarin dose: 5 mg daily  INR is just above therapeutic at 3.1 today. No active bleeding noted. Did receive blood with HD recently. Likely related to INR decrease on 12/11.   Goal of Therapy:  INR 2.5-3 Monitor platelets by anticoagulation protocol: Yes   Plan:  - Warfarin 5 mg x 1 dose at 1800 today - Daily PT/INR, CBC q72h ( Monday) - Will continue to monitor for any signs/symptoms of bleeding and will follow up with PT/INR in the a.m.    Thank you for involving pharmacy in this patient's  care.  Renold Genta, PharmD, BCPS Clinical Pharmacist Clinical phone for 04/14/2019 until 3p is [phone] 04/14/2019 12:43 PM  **Pharmacist phone directory can be found on [email or web address] listed under [place]**

## 2019-04-14 NOTE — Progress Notes (Signed)
Occupational Therapy Session Note  Patient Details  Name: Calvin [name] MRN: [id number] Date of Birth: 1955/07/25  Today's Date: 04/14/2019 OT Individual Time: 0815-0900 OT Individual Time Calculation (min): 45 min   Session 2:  OT Individual Time: 1525-1600 OT Individual Time Calculation (min): 35 min   Session 3:  OT Individual Time: 4315-4008 OT Individual Time Calculation (min): 15 min    Short Term Goals: Week 1:  OT Short Term Goal 1 (Week 1): Pt will don UB clothing EOB with mod A OT Short Term Goal 2 (Week 1): Pt will transfer to EOB with mod A OT Short Term Goal 3 (Week 1): Pt will reduce anxiety during OT, evidenced by maintaining HR under 130 bpm during bed mobility  Skilled Therapeutic Interventions/Progress Updates:    Pt received supine with no c/o pain. Session began with creation of new goals for this week, with pt actively participating and choosing goals for himself. Pt reporting bowel incontinence. Pt required +2 assist to roll R, stating "I can't" many times throughout session and emotional support/motivation provided. Pt required total A to clean up incontinent BM. Pt still has open areas on his bottom, nursing aware. Per pt request cream applied. New brief donned bed level total A. Pt transitioned to EOB with mod +2 assist. Pt required cueing for UE placement and encouragement. Once EOB pt able to maintain balance with close (S). He completed oral care EOB with set up assist. Pt attempted to scoot toward [facility] and required max A. Pt returned to supine, max +2. Pt left supine with all needs met.  Session 2: Pt received supine in bed, reporting brief had just been changed and pt was ready to get OOB. Pt's wife present throughout session and very supportive. Pt completed rolling R and L with mod A to don pants, total A. Pt completed bed mobility to EOB with heavy cueing for technique and max +2 assist. Pt changed shirt EOB with min A. Pt able to lean R for slideboard  placement with total A. Pt transferred to w/c with max A +2. Pt was given extensive demonstration and cueing for body mechanics to ease transfer. Pt then completed 3x sit <> stands in STEDY in the dayroom. With each subsequent transfer pt was able to lift hips off chair a bit more, never achieving a full stand. Pt was taken back to his room and left sitting up in the w/c. Goal set for pt to remain in w/c for 30 min.   Session 3: Pt in w/c awaiting transfer back to bed. Pt was set up for slideboard transfer. Again, extensive body mechanic cueing provide. Pt required max +2 assist to return to bed. Pt left supine with all needs met. Extensive discussion with pt's wife re realistic expectations, goal setting, and focus of therapy. Several OT goals to be removed to focus on functional mobility/ADL transfers.   Therapy Documentation Precautions:  Precautions Precautions: Fall Precaution Comments: monitor HR, L heel wound Restrictions Weight Bearing Restrictions: No   Therapy/Group: Individual Therapy  Curtis Sites 04/14/2019, 7:31 AM

## 2019-04-14 NOTE — Progress Notes (Signed)
[facility] PHYSICAL MEDICINE & REHABILITATION PROGRESS NOTE   Subjective/Complaints:   Pt concerned still having loose stools- wants probiotics increased.  INR 3.1  ROS: Patient denies fever, rash, sore throat, blurred vision, nausea, vomiting, diarrhea, cough,   chest pain, joint or back pain, headache .     Objective:   DG Chest 2 View NOW  Result Date: 04/12/2019 CLINICAL DATA:  Shortness of breath EXAM: CHEST - 2 VIEW COMPARISON:  04/09/2019 FINDINGS: Cardiac shadow remains enlarged. Distal aortic arch stent is noted related to the known history of coarctation of the aorta. Right jugular dialysis catheter is again seen. Platelike atelectasis is noted in right lung base with associated right-sided effusion. The overall appearance is stable. The left lung appears clear. Postsurgical changes in the left chest wall are noted. IMPRESSION: Stable appearance of the chest when compared with the prior study. No significant edema is noted. Electronically Signed   By: Inez Catalina M.D.   On: 04/12/2019 19:20   Recent Labs    04/13/19 0550 04/14/19 0714  WBC 15.6* 14.0*  HGB 8.2* 8.7*  HCT 27.2* 28.5*  PLT 550* 565*   Recent Labs    04/12/19 1237  NA 135  K 3.8  CL 98  CO2 24  GLUCOSE 120*  BUN 47*  CREATININE 6.77*  CALCIUM 8.9    Intake/Output Summary (Last 24 hours) at 04/14/2019 1647 Last data filed at 04/14/2019 1300 Gross per 24 hour  Intake 300 ml  Output -  Net 300 ml     Physical Exam: Vital Signs Blood pressure (!) 112/55, pulse 72, temperature 98.1 F (36.7 C), resp. rate 18, height 6\' 2"  (1.88 m), weight 134.2 kg, SpO2 97 %. Constitutional: No distress . Vital signs reviewed. Appears anxious and hyperfocused  HEENT: EOMI, oral membranes moist Neck: supple Cardiovascular: RRR without murmur. No JVD    Respiratory: CTA Bilaterally without wheezes or rales. Small inspirations    GI: BS +, non-tender, non-distended, abd scars  Musculoskeletal:      General: Edema present LE.1+     Comments: left shoulder limited by pain Neurological: He is alert and oriented to person, place, and time.  MMT 2-3/5 LE's, 3/5 UE.  Skin: Skin is warm and dry.  Multple toes with scabbed areas--due to walker injury. Large left heel wound with eschar, no drainage. Right heel with small lateral blister. Macerated, blistered areas in gluteal folds--not visualized today Psych: anxious   Assessment/Plan: 1. Functional deficits secondary to debility which require 3+ hours per day of interdisciplinary therapy in a comprehensive inpatient rehab setting.  Physiatrist is providing close team supervision and 24 hour management of active medical problems listed below.  Physiatrist and rehab team continue to assess barriers to discharge/monitor patient progress toward functional and medical goals  Care Tool:  Bathing  Bathing activity did not occur: Refused Body parts bathed by patient: Front perineal area, Buttocks         Bathing assist Assist Level: 2 Helpers     Upper Body Dressing/Undressing Upper body dressing   What is the patient wearing?: Pull over shirt    Upper body assist Assist Level: Supervision/Verbal cueing    Lower Body Dressing/Undressing Lower body dressing      What is the patient wearing?: Incontinence brief     Lower body assist Assist for lower body dressing: 2 Helpers     Toileting Toileting    Toileting assist Assist for toileting: 2 Helpers     Transfers Chair/bed  transfer  Transfers assist  Chair/bed transfer activity did not occur: Safety/medical concerns  Chair/bed transfer assist level: Maximal Assistance - Patient 25 - 49%     Locomotion Ambulation   Ambulation assist   Ambulation activity did not occur: Safety/medical concerns          Walk 10 feet activity   Assist  Walk 10 feet activity did not occur: Safety/medical concerns        Walk 50 feet activity   Assist Walk 50 feet with  2 turns activity did not occur: Safety/medical concerns         Walk 150 feet activity   Assist Walk 150 feet activity did not occur: Safety/medical concerns         Walk 10 feet on uneven surface  activity   Assist Walk 10 feet on uneven surfaces activity did not occur: Safety/medical concerns         Wheelchair     Assist Will patient use wheelchair at discharge?: Yes Type of Wheelchair: Manual Wheelchair activity did not occur: Safety/medical concerns  Wheelchair assist level: Supervision/Verbal cueing Max wheelchair distance: 5 ft    Wheelchair 50 feet with 2 turns activity    Assist    Wheelchair 50 feet with 2 turns activity did not occur: Safety/medical concerns       Wheelchair 150 feet activity     Assist  Wheelchair 150 feet activity did not occur: Safety/medical concerns       Blood pressure (!) 112/55, pulse 72, temperature 98.1 F (36.7 C), resp. rate 18, height 6\' 2"  (1.88 m), weight 134.2 kg, SpO2 97 %.  Medical Problem List and Plan: 1.  Impaired mobility and ADLs secondary to deibility             -patient may shower             -ELOS/Goals: modI in PT, OT, I in SLP  -reduced to 15/7.  I have reviewed expectations from activity standpoint with pt and wife. Pt states he's trying hard. Working on pacing/rest breaks, etc 2.  St Jude's Aortic Valve/Antithrombotics: -DVT/anticoagulation:  Pharmaceutical: Coumadin (INR 2.0)             -antiplatelet therapy: N/A 3. Pain Management: Oxycodone prn  -resume lyrica at hs only 25mg  4. Mood: LCSW to follow for evaluation and support.              -antipsychotic agents: N/A  -anxiety disorder/ recent MDD   -discussed with pt again today   -will increase xanax back to 0.5mg , discussed breathing techniques   -appreciate neuropsych visit 5. Neuropsych: This patient is capable of making decisions on his own behalf. 6. Skin/Wound Care: Air mattress overlay for MASD due to ongoing  diarrhea.     -continue EPC cream to form a protective barrier on buttocks   -rx loose stools  -prevalon boots bilateral heels  12/14- per pt request, increased probiotic frequency 7. Fluids/Electrolytes/Nutrition: Strict I/O. Renal diet with 1200 cc FR.  8. ESRD: Now on HD with attempts to get off 2 L. Schedule hemodialysis at the end of the day to help with therapy tolerance. TTS schedule   12/10: diet liberalized to help with intake  -volume/edema mgt per nephrology  -will add megace for appetite Filed Weights   04/12/19 1610 04/13/19 0327 04/14/19 0614  Weight: 133.5 kg 134.2 kg 134.2 kg     9. Hypotension: Monitor BP tid--continue midodrine. 10 History of A fib/A flutter: Monitor HR  t--on coumadin. Continue Lopressor 12.5mg  BID 11. Recent GIB/Anemia of chronic disease: Continue to monitor H/H with serial checks. On Retacrit 10,000 units with HD.   12/11: hgb/tranfusion per nephrology 1u --8.2 12. Loose stool: C diff negative  -continue fiber  -increased probiotic to 500mg   TID  -diet  -prn lomotil  -stools   improving    13. Cough with phlegm: Guaifenesin prn 14. Shortness of breath at rest: encouraged use of incentive spirometer.     -f/u cxr without changes    -weights are stable    -suspect a significant portion of SOB and fatigue is mood-related    -he agrees 15. Leukocytosis:  -WBC's up to 17k yesterday prior to HD, 15k today  -no other s/s infection at present, afebrile, spoke to nephrology  -continue to follow serially, check ua/ucx if we can get sample  -if further uptick tomorrow- will contact surgery  -reassured the patient that we are following closely  -Down to 14k from 15.7k   LOS: 7 days A FACE TO FACE EVALUATION WAS PERFORMED  Sareen Randon 04/14/2019, 4:47 PM

## 2019-04-14 NOTE — Progress Notes (Signed)
[facility] Progress Note   Dialysis Orders: Previously on PD -> transitioned recently to HD after episode peritonitis.  Non [facility] pt  Assessment/Plan: 1.Debility:PT/OT per CIR. 2. ESRD:PD cath removed 11/16 at another facility. Continue on HD TTS schedulehere. Volume improving  3.HTN/volume:BP stable. Anasarca improving, and R pleural effusion on CXR 12/09.On midodrine 10mg  TID. 4L off on 12/10 and 3.6 12/12 to 134.2  4. Anemia:Hgb 7.3 and symptomatic 12/10 -> now s/p 1U PRBC, Aranesp 252mcg q Thurs (his usual dose), and started course IV iron. Hx recent GIB - stable gastric ulcer, on PPI. No melena.hgb up to 8.3  5. Secondary hyperparathyroidism:CorrCa/P ok Continue VDRA binder. 6. Nutrition:Alb  2 continue pro-stat supplements. 7. Hx mechanical AoV, A-fib/flutter:Warfarin per pharmacy - INR 2.2 -  8. T2DM - per primary 9. Hx testicular cancer 10. Anxiety/depression - I believe some of SOB is related to anxiety - encouraged him to ask to talk to CIR psychologist again - on zoloft - may need to adjust meds  11. Leukocytosis - unexplained - does have TDC - get BC x 2 - though no fevers   [name], PA-C [facility] Beeper [phone] 04/14/2019,12:29 PM  LOS: 7 days    Subjective:  Feels better today.  Objective Vitals:   04/13/19 1409 04/13/19 1948 04/14/19 0612 04/14/19 0614  BP: (!) 106/46 (!) 112/48 (!) 116/50   Pulse: 84 86 (!) 44   Resp: 18 16 18    Temp: 98.4 F (36.9 C) 98.2 F (36.8 C) 98.2 F (36.8 C)   TempSrc: Oral Oral Oral   SpO2: 97% 100% 100%   Weight:    134.2 kg  Height:       Physical Exam  Supine in bed General:more animated today  obese breathing easily supine in bed  Heart: RRR + valve click Lungs: dim BS grossly clear Abdomen: morbidly obese most excess fluid is dependent hip/flank   wound site clear Extremities: tr LE edema Dialysis Access:  Right IJ [facility]   Additional Objective Labs: Basic  Metabolic Panel: Recent Labs  Lab 04/08/19 1846 04/10/19 1526 04/12/19 1237  NA 135 137 135  K 3.6 4.1 3.8  CL 96* 97* 98  CO2 26 25 24   GLUCOSE 106* 108* 120*  BUN 43* 72* 47*  CREATININE 5.36* 8.62* 6.77*  CALCIUM 7.9* 8.5* 8.9  PHOS  --  6.9* 4.9*   Liver Function Tests: Recent Labs  Lab 04/10/19 1526 04/12/19 1237  ALBUMIN 1.9* 2.0*   No results for input(s): LIPASE, AMYLASE in the last 168 hours. CBC: Recent Labs  Lab 04/08/19 0620 04/10/19 1525 04/12/19 1237 04/13/19 0550 04/14/19 0714  WBC 10.4 12.6* 17.3* 15.6* 14.0*  HGB 7.6* 7.3* 8.3* 8.2* 8.7*  HCT 25.5* 24.2* 27.3* 27.2* 28.5*  MCV 95.5 95.7 93.8 94.4 94.1  PLT 540* 549* 546* 550* 565*   Blood Culture    Component Value Date/Time   SDES BLOOD RIGHT HAND 04/13/2019 1140   SPECREQUEST  04/13/2019 1140    BOTTLES DRAWN AEROBIC ONLY Blood Culture adequate volume   CULT  04/13/2019 1140    NO GROWTH < 24 HOURS Performed at [facility], [place] [address], [place], [place] [address]    REPTSTATUS PENDING 04/13/2019 1140    Cardiac Enzymes: No results for input(s): CKTOTAL, CKMB, CKMBINDEX, TROPONINI in the last 168 hours. CBG: No results for input(s): GLUCAP in the last 168 hours. Iron Studies:  No results for input(s): IRON, TIBC, TRANSFERRIN, FERRITIN  in the last 72 hours. Lab Results  Component Value Date   INR 3.1 (H) 04/14/2019   INR 2.7 (H) 04/13/2019   INR 2.2 (H) 04/12/2019   PROTIME 21.6 (H) 05/26/2014   PROTIME 26.4 (H) 05/08/2014   PROTIME 31.2 (H) 01/26/2014   Studies/Results: DG Chest 2 View NOW  Result Date: 04/12/2019 CLINICAL DATA:  Shortness of breath EXAM: CHEST - 2 VIEW COMPARISON:  04/09/2019 FINDINGS: Cardiac shadow remains enlarged. Distal aortic arch stent is noted related to the known history of coarctation of the aorta. Right jugular dialysis catheter is again seen. Platelike atelectasis is noted in right lung base with associated right-sided effusion. The overall  appearance is stable. The left lung appears clear. Postsurgical changes in the left chest wall are noted. IMPRESSION: Stable appearance of the chest when compared with the prior study. No significant edema is noted. Electronically Signed   By: Inez Catalina M.D.   On: 04/12/2019 19:20   Medications: . ferric gluconate (FERRLECIT/NULECIT) IV Stopped (04/12/19 1608)   . sodium chloride   Intravenous Once  . ALPRAZolam  0.5 mg Oral TID  . calcitRIOL  0.25 mcg Oral Q T,Th,Sa-HD  . Chlorhexidine Gluconate Cloth  6 each Topical Daily  . darbepoetin (ARANESP) injection - DIALYSIS  200 mcg Intravenous Q Thu-HD  . feeding supplement (PRO-STAT SUGAR FREE 64)  30 mL Oral BID  . liver oil-zinc oxide   Topical TID  . megestrol  400 mg Oral BID  . metoprolol tartrate  12.5 mg Oral BID  . midodrine  10 mg Oral TID WC  . multivitamin  1 tablet Oral QHS  . pantoprazole  40 mg Oral BID  . polycarbophil  625 mg Oral TID WC  . pregabalin  25 mg Oral QHS  . saccharomyces boulardii  500 mg Oral TID  . sertraline  50 mg Oral Daily  . sevelamer carbonate  1,600 mg Oral TID WC  . Warfarin - Pharmacist Dosing Inpatient   Does not apply [phone]

## 2019-04-14 NOTE — Plan of Care (Signed)
Bathing goals discontinued and dressing downgraded to reflect focus on ADL transfers and functional mobility.   Problem: RH Bathing Goal: LTG Patient will bathe all body parts with assist levels (OT) Description: LTG: Patient will bathe all body parts with assist levels (OT) Outcome: Not Applicable Flowsheets (Taken 04/14/2019 1709) LTG: Pt will perform bathing with assistance level/cueing: (Goal d/c 12/14) --   Problem: RH Dressing Goal: LTG Patient will perform lower body dressing w/assist (OT) Description: LTG: Patient will perform lower body dressing with assist, with/without cues in positioning using equipment (OT) Flowsheets (Taken 04/14/2019 1709) LTG: Pt will perform lower body dressing with assistance level of: (goal downgraded 12/14) Maximal Assistance - Patient 25 - 49%   Problem: RH Toileting Goal: LTG Patient will perform toileting task (3/3 steps) with assistance level (OT) Description: LTG: Patient will perform toileting task (3/3 steps) with assistance level (OT)  Flowsheets (Taken 04/14/2019 1709) LTG: Pt will perform toileting task (3/3 steps) with assistance level: (goal downgraded 12/14) Maximal Assistance - Patient 25 - 49%   Problem: Sit to Stand Goal: LTG:  Patient will perform sit to stand in prep for activites of daily living with assistance level (OT) Description: LTG:  Patient will perform sit to stand in prep for activites of daily living with assistance level (OT) Flowsheets (Taken 04/14/2019 1711) LTG: PT will perform sit to stand in prep for activites of daily living with assistance level: Maximal Assistance - Patient 25 - 49%

## 2019-04-14 NOTE — Progress Notes (Signed)
Physical Therapy Session Note  Patient Details  Name: Calvin [name] MRN: [id number] Date of Birth: 1955-11-26  Today's Date: 04/14/2019 PT Individual Time: 1030-1125 PT Individual Time Calculation (min): 55 min   Short Term Goals: Week 1:  PT Short Term Goal 1 (Week 1): Pt will initiate OOB mobility PT Short Term Goal 2 (Week 1): Pt will perform bed mobility w/ mod assist +1 PT Short Term Goal 3 (Week 1): Pt will initiate w/c propulsion  Skilled Therapeutic Interventions/Progress Updates: Pt resting in bed; he denied pain.  He stated that he had had a bowel accident.  Mod/max assist for rolling L><R and using bed features, for peri care and brief change, as well as donning pants.  Pt had diarrhea; PT informed Mikayla, NT.   Supine with HOB raised, to sitting EOB with max assist.  Slide board transfer slightly downhill, to L, with mod assist initially, then +2.  Pt agreed to trying shoes.  PT donned bil shoes. PT lowered bil footrests for better pelvic/hip joint positioning and reducting hip external rotation.   Therapeutic exercise performed with LE to increase strength for functional mobility: seated, 10 x 3 throughout session, of bil hip adductor squeezes with internal rotation, against soft bolster.  Cues to count aloud.  After sitting in wc x 5 minutes, pt stated that his bottom hurt.  He may have had a bowel accident.  +2 to transfer with slide board back to bed.  Sit> supine with max assist.  At end of session, pt in bed with alarm set, needs at hand.  Pt using call bell appropriately to ask for hygiene change.     Therapy Documentation Precautions:  Precautions Precautions: Fall Precaution Comments: monitor HR, L heel wound Restrictions Weight Bearing Restrictions: No      Therapy/Group: Individual Therapy  Shahrzad Koble 04/14/2019, 12:34 PM

## 2019-04-15 ENCOUNTER — Inpatient Hospital Stay (HOSPITAL_COMMUNITY): Payer: Medicare Other

## 2019-04-15 LAB — PROTIME-INR
INR: 4 — ABNORMAL HIGH (ref 0.8–1.2)
Prothrombin Time: 38.9 seconds — ABNORMAL HIGH (ref 11.4–15.2)

## 2019-04-15 LAB — CBC
HCT: 27.2 % — ABNORMAL LOW (ref 39.0–52.0)
Hemoglobin: 8.4 g/dL — ABNORMAL LOW (ref 13.0–17.0)
MCH: 28.8 pg (ref 26.0–34.0)
MCHC: 30.9 g/dL (ref 30.0–36.0)
MCV: 93.2 fL (ref 80.0–100.0)
Platelets: 606 10*3/uL — ABNORMAL HIGH (ref 150–400)
RBC: 2.92 MIL/uL — ABNORMAL LOW (ref 4.22–5.81)
RDW: 17.1 % — ABNORMAL HIGH (ref 11.5–15.5)
WBC: 15.9 10*3/uL — ABNORMAL HIGH (ref 4.0–10.5)
nRBC: 0 % (ref 0.0–0.2)

## 2019-04-15 LAB — RENAL FUNCTION PANEL
Albumin: 2 g/dL — ABNORMAL LOW (ref 3.5–5.0)
Anion gap: 17 — ABNORMAL HIGH (ref 5–15)
BUN: 74 mg/dL — ABNORMAL HIGH (ref 8–23)
CO2: 24 mmol/L (ref 22–32)
Calcium: 8.7 mg/dL — ABNORMAL LOW (ref 8.9–10.3)
Chloride: 95 mmol/L — ABNORMAL LOW (ref 98–111)
Creatinine, Ser: 8.56 mg/dL — ABNORMAL HIGH (ref 0.61–1.24)
GFR calc Af Amer: 7 mL/min — ABNORMAL LOW (ref >60)
GFR calc non Af Amer: 6 mL/min — ABNORMAL LOW (ref >60)
Glucose, Bld: 118 mg/dL — ABNORMAL HIGH (ref 70–99)
Phosphorus: 5.1 mg/dL — ABNORMAL HIGH (ref 2.5–4.6)
Potassium: 3.7 mmol/L (ref 3.5–5.1)
Sodium: 136 mmol/L (ref 135–145)

## 2019-04-15 MED ORDER — WARFARIN SODIUM 1 MG PO TABS
1.0000 mg | ORAL_TABLET | Freq: Once | ORAL | Status: AC
  Administered 2019-04-15: 19:00:00 1 mg via ORAL
  Filled 2019-04-15: qty 1

## 2019-04-15 MED ORDER — LIDOCAINE HCL (PF) 1 % IJ SOLN: 5.0000 mL | INTRAMUSCULAR | Status: DC | PRN

## 2019-04-15 MED ORDER — HEPARIN SODIUM (PORCINE) 1000 UNIT/ML DIALYSIS: 1000.0000 [IU] | INTRAMUSCULAR | Status: DC | PRN

## 2019-04-15 MED ORDER — GERHARDT'S BUTT CREAM
TOPICAL_CREAM | Freq: Four times a day (QID) | CUTANEOUS | Status: DC
  Filled 2019-04-15 (×5): qty 1

## 2019-04-15 MED ORDER — ALTEPLASE 2 MG IJ SOLR: 2.0000 mg | Freq: Once | INTRAMUSCULAR | Status: DC | PRN

## 2019-04-15 MED ORDER — SODIUM CHLORIDE 0.9 % IV SOLN: 100.0000 mL | INTRAVENOUS | Status: DC | PRN

## 2019-04-15 MED ORDER — PENTAFLUOROPROP-TETRAFLUOROETH EX AERO: 1.0000 "application " | INHALATION_SPRAY | CUTANEOUS | Status: DC | PRN

## 2019-04-15 MED ORDER — CHLORHEXIDINE GLUCONATE CLOTH 2 % EX PADS
6.0000 | MEDICATED_PAD | Freq: Two times a day (BID) | CUTANEOUS | Status: DC
  Administered 2019-04-15 – 2019-04-22 (×12): 6 via TOPICAL

## 2019-04-15 MED ORDER — LIDOCAINE-PRILOCAINE 2.5-2.5 % EX CREA: 1.0000 "application " | TOPICAL_CREAM | CUTANEOUS | Status: DC | PRN

## 2019-04-15 MED ORDER — HEPARIN SODIUM (PORCINE) 1000 UNIT/ML IJ SOLN
INTRAMUSCULAR | Status: AC
  Filled 2019-04-15: qty 5

## 2019-04-15 MED ORDER — HEPARIN SODIUM (PORCINE) 1000 UNIT/ML DIALYSIS: 20.0000 [IU]/kg | INTRAMUSCULAR | Status: DC | PRN

## 2019-04-15 NOTE — Progress Notes (Signed)
Physical Therapy Weekly Progress Note  Patient Details  Name: Calvin [name] MRN: [id number] Date of Birth: 1956/04/23  Beginning of progress report period: April 08, 2019 End of progress report period: April 15, 2019  Today's Date: 04/15/2019 PT Individual Time: 1117-1200 PT Individual Time Calculation (min): 43 min  Pt presents up in w/c almost 2 hours and his bottom is hurting. Pt also reports having a BM. Attempted to engage in w/c mobility training to set up w/c for transfer back to bed requiring min assist to propel about 5' in the room. Pt reports limited due to fatigue. Set up for w/c with pt able to verbalize sequence for slideboard placement. Required max +2 assist for slideboard transfer (may benefit from use of Beazy board in future session due to significant breakdown on bottom to decrease sheer until pt able to clear bottom better) with cues for anterior weightshift and head/hips relationship. Pt required mod assist for BLE management to return to supine. Max assist for rolling to L and R for hygiene and brief change and clothing management with focus on technique pt attempting to assist as much as possible. Required 2 brief changes as soon changed, pt with BM again. Pt may also benefit from air mattress due to skin breakdown and decreased ability to position in bed without significant assist.    Patient has met 2 of 3 short term goals. Pt has been limited in OOB due to bowel issues and decreased tolerance for OOB due to pain and fatigue. Pt currently requiring max assist for bed mobility and +2 assist for slideboard transfers. Pt with significant breakdown on skin on bottom and will benefit from continued focus on increasing ability to sit <> stand or clear bottom better on transfers at this time. (potential to try [name]). D/c set for 12/29 at this time with goals to be monitored. If pt does not continue to make better gains, may require more caregiver assist at d/c or use of  lift for safety. Wife has been a part of these conversations with primary care team.   Patient continues to demonstrate the following deficits muscle weakness and muscle joint tightness, decreased cardiorespiratoy endurance and decreased sitting balance, decreased standing balance and decreased balance strategies and therefore will continue to benefit from skilled PT intervention to increase functional independence with mobility.  Patient is making slow progress towards LTGs..  Continue plan of care. Continue to monitor if goals need to be downgraded.  PT Short Term Goals Week 1:  PT Short Term Goal 1 (Week 1): Pt will initiate OOB mobility PT Short Term Goal 1 - Progress (Week 1): Met PT Short Term Goal 2 (Week 1): Pt will perform bed mobility w/ mod assist +1 PT Short Term Goal 2 - Progress (Week 1): Not met PT Short Term Goal 3 (Week 1): Pt will initiate w/c propulsion PT Short Term Goal 3 - Progress (Week 1): Met Week 2:  PT Short Term Goal 1 (Week 2): Pt will perform bed mobiity with mod assist PT Short Term Goal 2 (Week 2): Pt will be able to propel w/c x 50' with supervision PT Short Term Goal 3 (Week 2): Pt will be able to perform bed <> chair transfers with max +1 assist  Skilled Therapeutic Interventions/Progress Updates:  Ambulation/gait training;Discharge planning;DME/adaptive equipment instruction;Functional mobility training;Pain management;Psychosocial support;Splinting/orthotics;Therapeutic Activities;UE/LE Strength taining/ROM;Wheelchair propulsion/positioning;UE/LE Coordination activities;Therapeutic Exercise;Skin care/wound management;Patient/family education;Neuromuscular re-education;Functional electrical stimulation;Disease management/prevention;Community reintegration;Balance/vestibular training   Therapy Documentation Precautions:  Precautions Precautions: Fall Precaution  Comments: monitor HR, L heel wound Restrictions Weight Bearing Restrictions: No  Pain: C/o  bottom hurting sitting up in the w/c. Returned to bed and educated on laying on side to reduce pressure. Will need to address boosting schedule for while up in w/c and recommending change in cushion to increase sitting tolerance.   Therapy/Group: Individual Therapy  Canary Brim Ivory Broad, PT, DPT, CBIS  04/15/2019, 12:26 PM

## 2019-04-15 NOTE — Patient Care Conference (Signed)
Inpatient RehabilitationTeam Conference and Plan of Care Update Date: 04/15/2019   Time: 10:10 AM    Patient Name: Calvin [name]      Medical Record Number: [id number]  Date of Birth: November 14, 1955 Sex: Male         Room/Bed: 4W11C/4W11C-01 Payor Info: Payor: MEDICARE / Plan: MEDICARE PART A AND B / Product Type: *No Product type* /    Admit Date/Time:  04/05/2019  2:47 PM  Primary Diagnosis:  Physical debility  Patient Active Problem List   Diagnosis Date Noted  . Generalized anxiety disorder   . SOB (shortness of breath)   . Physical debility 05/01/2019  . Complex coarctation of the aorta   . Hepatitis C   . AVD (aortic valve disease)   . Long term (current) use of anticoagulants 12/12/2013  . Chronic anticoagulation 07/10/2012  . Gastric ulcer 07/04/2012  . Gout 07/03/2012  . Bilateral lower extremity edema 07/03/2012  . CKD (chronic kidney disease) stage 3, GFR 30-59 ml/min 07/02/2012  . Knee pain 12/03/2011  . Anemia of renal disease 10/04/2011  . Hyperlipidemia 04/23/2011  . 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 S/P aortic valve replacement 04/05/2011  . Thoracic aortic aneurysm ([place]) 04/05/2011  . Atrial fibrillation ([place]) 04/05/2011  . History of atrial flutter s/p DCCV 04/05/2011  . Coarctation of aorta (previous repair and stent) 04/05/2011  . Hypertension 04/05/2011  . CAD (coronary artery disease) 04/05/2011  . [place] ([place]) 04/03/2011  . Anemia, iron deficiency 04/03/2011    Expected Discharge Date: Expected Discharge Date: 04/29/19  Team Members Present: Physician leading conference: Dr. Alger Simons Social Worker Present: Lennart Pall, LCSW Nurse Present: Mohammed Kindle, RN Case Manager: Karene Fry, RN PT Present: Canary Brim, PT OT Present: Laverle Hobby, OT SLP Present: Weston Anna, SLP PPS Coordinator present : Ileana Ladd, Burna Mortimer, SLP     Current Status/Progress Goal Weekly Team Focus  Bowel/Bladder   Incontinent of B/B  Regain  continence  Assess toileting Q2 Hr/prn   Swallow/Nutrition/ Hydration             ADL's   Limited by frequent diarrhea, bed level ADLs max-total A. Max A bed mobility. +2 transfers  mod-max A overall  ADL transfers, ADL retraining, OOB tolerance   Mobility   +2 bed mobility and basic transfers with slide board; participation limited by frequent incontinent episodes of diarrhea  min assist bed mobility and basic transfers, mod assist car tr, mod assist ambulation x 10'controlled env only, supervision w/c x 50' controlled and home env  sitting endurance, bed mobility and slide board transfers   Communication             Safety/Cognition/ Behavioral Observations            Pain   Denies pain  remain free of pain  QS/PRN assessment and evaluate   Skin   Entire buttock area with breakdown dur to freuency of stools with irritation, WOC consult, darken discoloration to heel elevate and apply PRAFO boots  RESOLVE AD PREVENT ADDITIONAL SKIN REAKDOWN , SKIN FREE OF INFECTION  QS/PRN assessment    Rehab Goals Patient on target to meet rehab goals: Yes *See Care Plan and progress notes for long and short-term goals.     Barriers to Discharge  Current Status/Progress Possible Resolutions Date Resolved   Nursing                  PT  OT Hemodialysis;Incontinence                SLP                SW                Discharge Planning/Teaching Needs:  Home with family/ wife to provide 24/7 assistance.  Teaching needs TBD   Team Discussion: Anxiety, reactive depression, increased WBC, meds adjusted, better night sleeping last night, adjusting dialysis, fear of NHP.  RN - bottom raw and bleeding, wife with cream from home, heel spot on R and L heel, has orders for prevalon boots.  OT frequent diarrhea, long talk with wife, can hire help, focus more on mobility, goals mod/max overall.  PT limited OOB, max A bed, +2 slideboard, goals min A short distance ambulation.  MD would  like neuropsych to see again.   Revisions to Treatment Plan: N/A     Medical Summary Current Status: stools still frequent, improving consistency. anxiety, pain problems. skin macerated from stool, heel pressure sores Weekly Focus/Goal: improve activity tolerance, decreased anxiety, skin care. renal mgt  Barriers to Discharge: Behavior;Medical stability;Wound care   Possible Resolutions to Barriers: see medical notes   Continued Need for Acute Rehabilitation Level of Care: The patient requires daily medical management by a physician with specialized training in physical medicine and rehabilitation for the following reasons: Direction of a multidisciplinary physical rehabilitation program to maximize functional independence : Yes Medical management of patient stability for increased activity during participation in an intensive rehabilitation regime.: Yes Analysis of laboratory values and/or radiology reports with any subsequent need for medication adjustment and/or medical intervention. : Yes   I attest that I was present, lead the team conference, and concur with the assessment and plan of the team.   Retta Diones 04/17/2019, 9:35 AM  Team conference was held via web/ teleconference due to [place] - 19

## 2019-04-15 NOTE — Progress Notes (Signed)
Occupational Therapy Weekly Progress Note  Patient Details  Name: Calvin [name] MRN: [id number] Date of Birth: 16-Jun-1955  Beginning of progress report period: April 08, 2019 End of progress report period: April 15, 2019  Today's Date: 04/15/2019 OT Individual Time: 9563-8756 OT Individual Time Calculation (min): 39 min    Patient has met 2 of 3 short term goals.  Pt continues to make slow progress with OT. He is limited by frequent diarrhea and high anxiety which limits his OOB mobility. Multiple strategies being used to increase motivation. Pt remains max-total A LB and toileting at bed level. Improvements have been made in UB dressing EOB, requiring only min A now. Pt still requires max +2 for all transfers and bed mobility.   Patient continues to demonstrate the following deficits: muscle weakness, decreased cardiorespiratoy endurance, decreased initiation and decreased sitting balance, decreased standing balance, decreased postural control and decreased balance strategies and therefore will continue to benefit from skilled OT intervention to enhance overall performance with BADL and Reduce care partner burden.  Patient not progressing toward long term goals.  See goal revision..  Plan of care revisions: Bathing goals discontinued. Several goals downgraded.  OT Short Term Goals Week 1:  OT Short Term Goal 1 (Week 1): Pt will don UB clothing EOB with mod A OT Short Term Goal 1 - Progress (Week 1): Met OT Short Term Goal 2 (Week 1): Pt will transfer to EOB with mod A OT Short Term Goal 2 - Progress (Week 1): Not met OT Short Term Goal 3 (Week 1): Pt will reduce anxiety during OT, evidenced by maintaining HR under 130 bpm during bed mobility OT Short Term Goal 3 - Progress (Week 1): Met Week 2:  OT Short Term Goal 1 (Week 2): Pt will remain OOB in w/c for 45 min to increase functional activity tolerance OT Short Term Goal 2 (Week 2): Pt will complete slideboard transfer to [facility] with  max +1 OT Short Term Goal 3 (Week 2): Pt will consistently get OOB 1x a day  Skilled Therapeutic Interventions/Progress Updates:    Session 1: Pt received supine with no c/o pain, not happy with therapy being this early. Explained need for sessions to start early on dialysis days to ensure proper spacing between session to allow pt to rest. Pt rolled R and L to check brief for incontinence- pt clean. Pt transferred to EOB with max +2 assist. Pt able to maintain EOB sitting balance with no LOB, occasional postural sway. Pt doffed shirt and donned new one with min A in prep for dialysis. Pt sat for 10 min before insisting on returning to supine, refusing to attempt out of bed mobility or anything further EOB. Max +2 to return supine. Attempted to use some "tough love" to increase pt motivation and self-efficacy. Not effective and pt frustrated. Pt encouraged in previous methods and collaborated on creating preferred schedule on dialysis days to ensure maximal participation. Pt was assisted in using a shower cap at bed level, mod A overall. Pt was set up to eat breakfast, offered fresh coffee, he declined. HR WFL during session, around 100 bpm. Bed alarm set.   Therapy Documentation Precautions:  Precautions Precautions: Fall Precaution Comments: monitor HR, L heel wound Restrictions Weight Bearing Restrictions: No   Therapy/Group: Individual Therapy  Curtis Sites 04/15/2019, 6:44 AM

## 2019-04-15 NOTE — Progress Notes (Signed)
[place] PHYSICAL MEDICINE & REHABILITATION PROGRESS NOTE   Subjective/Complaints: Feels that appetite is a little better. Still stooling a lot but they are mushy, not watery. Sleeping better. Feels that he's accomplishing some of his goals. Still is self-limiting in therapy at times. stooling affects therapy participation also  ROS: Patient denies fever, rash, sore throat, blurred vision, nausea, vomiting,    chest pain, j  headache, or mood change.    Objective:   No results found. Recent Labs    04/13/19 0550 04/14/19 0714  WBC 15.6* 14.0*  HGB 8.2* 8.7*  HCT 27.2* 28.5*  PLT 550* 565*   Recent Labs    04/12/19 1237  NA 135  K 3.8  CL 98  CO2 24  GLUCOSE 120*  BUN 47*  CREATININE 6.77*  CALCIUM 8.9    Intake/Output Summary (Last 24 hours) at 04/15/2019 1141 Last data filed at 04/15/2019 0900 Gross per 24 hour  Intake 360 ml  Output --  Net 360 ml     Physical Exam: Vital Signs Blood pressure (!) 118/55, pulse 83, temperature 97.8 F (36.6 C), temperature source Oral, resp. rate 16, height 6\' 2"  (1.88 m), weight (!) 136.5 kg, SpO2 97 %. Constitutional: No distress . Vital signs reviewed. HEENT: EOMI, oral membranes moist Neck: supple Cardiovascular: RRR without murmur. No JVD    Respiratory: CTA Bilaterally without wheezes or rales. Normal effort    GI: BS +, non-tender, non-distended  Musculoskeletal:        General: Edema present LE.1+     Comments: left shoulder limited by pain Neurological: He is alert and oriented to person, place, and time.  MMT 2-3/5 LE's, 3/5 UE.  Skin: Skin is warm and dry.  Multple toes with scabbed areas--unchanged. Large left heel wound with eschar, no drainage. Right heel with small lateral blister. Macerated, blistered areas along gluteal folds, still raw, bleeding slightly Psych: remains sl anxious   Assessment/Plan: 1. Functional deficits secondary to debility which require 3+ hours per day of interdisciplinary  therapy in a comprehensive inpatient rehab setting.  Physiatrist is providing close team supervision and 24 hour management of active medical problems listed below.  Physiatrist and rehab team continue to assess barriers to discharge/monitor patient progress toward functional and medical goals  Care Tool:  Bathing  Bathing activity did not occur: Refused Body parts bathed by patient: Front perineal area, Buttocks         Bathing assist Assist Level: 2 Helpers     Upper Body Dressing/Undressing Upper body dressing   What is the patient wearing?: Pull over shirt    Upper body assist Assist Level: Supervision/Verbal cueing    Lower Body Dressing/Undressing Lower body dressing      What is the patient wearing?: Incontinence brief     Lower body assist Assist for lower body dressing: 2 Helpers     Toileting Toileting    Toileting assist Assist for toileting: 2 Helpers     Transfers Chair/bed transfer  Transfers assist  Chair/bed transfer activity did not occur: Safety/medical concerns  Chair/bed transfer assist level: Maximal Assistance - Patient 25 - 49%     Locomotion Ambulation   Ambulation assist   Ambulation activity did not occur: Safety/medical concerns          Walk 10 feet activity   Assist  Walk 10 feet activity did not occur: Safety/medical concerns        Walk 50 feet activity   Assist Walk 50 feet with  2 turns activity did not occur: Safety/medical concerns         Walk 150 feet activity   Assist Walk 150 feet activity did not occur: Safety/medical concerns         Walk 10 feet on uneven surface  activity   Assist Walk 10 feet on uneven surfaces activity did not occur: Safety/medical concerns         Wheelchair     Assist Will patient use wheelchair at discharge?: Yes Type of Wheelchair: Manual Wheelchair activity did not occur: Safety/medical concerns  Wheelchair assist level: Supervision/Verbal  cueing Max wheelchair distance: 5 ft    Wheelchair 50 feet with 2 turns activity    Assist    Wheelchair 50 feet with 2 turns activity did not occur: Safety/medical concerns       Wheelchair 150 feet activity     Assist  Wheelchair 150 feet activity did not occur: Safety/medical concerns       Blood pressure (!) 118/55, pulse 83, temperature 97.8 F (36.6 C), temperature source Oral, resp. rate 16, height 6\' 2"  (1.88 m), weight (!) 136.5 kg, SpO2 97 %.  Medical Problem List and Plan: 1.  Impaired mobility and ADLs secondary to deibility             -patient may shower             -ELOS/Goals: modI in PT, OT, I in SLP  -therapy is 15/7.  2.  St Jude's Aortic Valve/Antithrombotics: -DVT/anticoagulation:  Pharmaceutical: Coumadin (INR 2.0)             -antiplatelet therapy: N/A 3. Pain Management: Oxycodone prn  -resumed lyrica at hs only 25mg  4. Mood: LCSW to follow for evaluation and support.              -antipsychotic agents: N/A  -anxiety disorder/ recent MDD   -discussed with pt again today   -will increase xanax back to 0.5mg , discussed breathing techniques   -appreciate neuropsych visit 5. Neuropsych: This patient is capable of making decisions on his own behalf. 6. Skin/Wound Care: Air mattress overlay for MASD due to ongoing diarrhea.     -will try gerhardt's cream to buttocks   -rx loose stools  -prevalon boots bilateral heels    7. Fluids/Electrolytes/Nutrition: Strict I/O. Renal diet with 1200 cc FR.  8. ESRD: Now on HD with attempts to get off 2 L. Schedule hemodialysis at the end of the day to help with therapy tolerance. TTS schedule   12/10: diet liberalized to help with intake  -volume/edema mgt per nephrology  -continue megace for appetite Filed Weights   04/13/19 0327 04/14/19 0614 04/15/19 0335  Weight: 134.2 kg 134.2 kg (!) 136.5 kg     9. Hypotension: Monitor BP tid--continue midodrine. 10 History of A fib/A flutter: Monitor HR t--on  coumadin. Continue Lopressor 12.5mg  BID 11. Recent GIB/Anemia of chronic disease: Continue to monitor H/H with serial checks. On Retacrit 10,000 units with HD.   12/11: hgb/tranfusion per nephrology 1u --8.2 12. Loose stool: C diff negative  -continue fiber  -increased probiotic to 500mg   TID  -diet  -prn lomotil  -stools   improving    13. Cough with phlegm: Guaifenesin prn 14. Shortness of breath at rest: encouraged use of incentive spirometer.     -f/u cxr without changes    -weights are stable    -suspect a significant portion of SOB and fatigue is mood-related     -he agrees 15. Leukocytosis:  -  wbc's 14k yesterday  -bc negative x 2.  -continue to follow wbc's serially  -reassuring patient   LOS: 8 days A FACE TO FACE EVALUATION WAS PERFORMED  Meredith Staggers 04/15/2019, 11:41 AM

## 2019-04-15 NOTE — Progress Notes (Signed)
Occupational Therapy Session Note  Patient Details  Name: Calvin [name] MRN: [id number] Date of Birth: [birth year]  Today's Date: 04/15/2019 OT Individual Time: 0922-1000 OT Individual Time Calculation (min): 38 min    Short Term Goals: Week 2:  OT Short Term Goal 1 (Week 2): Pt will remain OOB in w/c for 45 min to increase functional activity tolerance OT Short Term Goal 2 (Week 2): Pt will complete slideboard transfer to [facility] with max +1 OT Short Term Goal 3 (Week 2): Pt will consistently get OOB 1x a day  Skilled Therapeutic Interventions/Progress Updates:    Upon entry to room pt in middle of nursing care. Nursing finished and then pt reported another BM. Total A for peri care. Once finished pt completed rolling R and L with max A to don pants. Pt transitioned to sitting EOB with max +2. Pt sat EOB and completed R lateral lean for slideboard placement. Pt completed slideboard transfer to the w/c with mod-max +2, improvement from last session! Pt unable to scoot once in chair, requiring max +2 to reposition. Pt completed oral care at the sink with set up assist. Pt left sitting up, encouraged to sit up 1 hour 15 min until next therapy session.   Therapy Documentation Precautions:  Precautions Precautions: Fall Precaution Comments: monitor HR, L heel wound Restrictions Weight Bearing Restrictions: No   Therapy/Group: Individual Therapy  Curtis Sites 04/15/2019, 8:14 AM

## 2019-04-15 NOTE — Progress Notes (Signed)
ANTICOAGULATION CONSULT NOTE - Follow Up Consult  Pharmacy Consult for Coumadin Indication: St Jude AVR + Afib  Patient Measurements: Height: 6\' 2"  (188 cm) Weight: (!) 300 lb 14.9 oz (136.5 kg) IBW/kg (Calculated) : 82.2  Vital Signs: Temp: 97.8 F (36.6 C) (12/15 0335) Temp Source: Oral (12/15 0335) BP: 118/55 (12/15 0335) Pulse Rate: 83 (12/15 0335)  Labs: Recent Labs    04/12/19 1237 04/13/19 0550 04/14/19 0714 04/15/19 0603  HGB 8.3* 8.2* 8.7*  --   HCT 27.3* 27.2* 28.5*  --   PLT 546* 550* 565*  --   LABPROT  --  28.6* 32.0* 38.9*  INR  --  2.7* 3.1* 4.0*  CREATININE 6.77*  --   --   --     Estimated Creatinine Clearance: 16.6 mL/min (A) (by C-G formula based on SCr of 6.77 mg/dL (H)).  Assessment: Warfarin PTA for hx St Jude AVR + Afib. Range should be 2.5-3.5 given both of these; however, per discussion with patient, he was told to aim for 2.5 mid 2019 by MD - will aim for 2.5-3 in CIR. Pt noted to have recent GIB.  PTA warfarin dose: 5 mg daily  INR up to 4.0 today, likely due to the 5 doses of 6 and 7.5mg  received 12/8-12/12 and megestrol drug interaction (started 12/13). Eating 50-100 %. No active bleeding noted. Will give small dose to prevent precipitous drop in INR in this patient with a mechanical valve.  Goal of Therapy:  INR 2.5-3 Monitor platelets by anticoagulation protocol: Yes   Plan:  - Warfarin 1mg  x1 today - Daily PT/INR, CBC q72h ( Monday) - Will continue to monitor for any signs/symptoms of bleeding and will follow up with PT/INR in the a.m.    Thank you for involving pharmacy in this patient's care.  Benetta Spar, PharmD, BCPS, BCCP Clinical Pharmacist  Please check AMION for all [place] phone numbers After 10:00 PM, call [place] [phone]

## 2019-04-15 NOTE — Progress Notes (Signed)
Nutrition Follow-up  RD working remotely.  DOCUMENTATION CODES:   Obesity unspecified  INTERVENTION:   - Continue Pro-Stat 30 ml po BID, each supplement provides 100 kcal and 15 grams of protein  - Wife bringing in home Liquacel protein modular, continue due patient preference  - Continue double portions  - Continue renal MVI daily  - Continue liberalized Regular diet to promote PO intake  NUTRITION DIAGNOSIS:   Increased nutrient needs related to acute illness, chronic illness (peritonitis) as evidenced by estimated needs.  Ongoing, being addressed via diet liberalization and supplements  GOAL:   Patient will meet greater than or equal to 90% of their needs  Progressing  MONITOR:   PO intake, Diet advancement, Supplement acceptance, Labs, Weight trends  REASON FOR ASSESSMENT:   Other (ESRD on PD)    ASSESSMENT:   63 year old patient with recent admission 10/21-11/1 for sepsis due to peritonitis. Pt was readmitted to [facility] 11/11-11/24 for weakness, diarrhea and issues with PD catheter. PD cath was removed and pt transitioned to HD on Tuesday, Thursday and Saturday. PMH of congenital heart disease, HTN, GERD, anxiety, depression and colon polyps. Pt admitted to CIR for significant debility.  Megace added for appetite. Pt due for HD today. Last HD on 12/12 with 3636 ml net UF. Post-HD weight 133.5 kg.  Weight has been fluctuating between 294-310 lbs since 12/08. Suspect weight fluctuations related to fluid status and HD treatments. Per RN edema assessment, pt with mild pitting generalized edema.  Spoke with pt via phone call to room. Pt reports that he is able to order the foods he likes now that he is on a Regular diet and that it has been going well. Pt states he is taking both the Pro-stat and the protein modular that his wife is bringing from home. Pt denies any nutrition-related issues at this time.  Meal Completion: 50-100% x last 5  meals  Medications reviewed and include: calcitriol, Aranesp, Pro-stat BID, Megace 400 mg BID, rena-vit, protonix, Fibercon, Florastor, Renvela TID with meals, warfarin, ferric gluconate with HD  Labs reviewed: phosphors 4.9, hemoglobin 8.7  Diet Order:   Diet Order            Diet regular Room service appropriate? Yes; Fluid consistency: Thin; Fluid restriction: 1500 mL Fluid  Diet effective now              EDUCATION NEEDS:   Not appropriate for education at this time  Skin:  Skin Assessment: Skin Integrity Issues: Stage II: right heel Unstageable: left heel Other: non-pressure wound to buttocks  Last BM:  04/15/19 type 6  Height:   Ht Readings from Last 1 Encounters:  04/22/2019 6\' 2"  (1.88 m)    Weight:   Wt Readings from Last 1 Encounters:  04/15/19 (!) 136.5 kg    Ideal Body Weight:  86.4 kg  BMI:  Body mass index is 38.64 kg/m.  Estimated Nutritional Needs:   Kcal:  2600-2800  Protein:  150-175  Fluid:  1076ml + UOP    Gaynell Face, MS, RD, LDN Inpatient Clinical Dietitian Pager: [phone] Weekend/After Hours: [phone]

## 2019-04-15 NOTE — Progress Notes (Addendum)
[facility] Progress Note   Dialysis Orders: Previously on PD -> transitioned recently to HD after episode peritonitis.  Non [facility]A pt  Assessment/Plan: 1.Debility:PT/OT per CIR. 2. ESRD:PD cath removed 11/16 at another facility. Continue on HD TTS schedulehere. Volume improving  Plan HD today 3.HTN/volume:BP stable. Anasarca improving, and R pleural effusion on CXR 12/09.CXR stable effusion 1/2/12On midodrine 61m TID. 4L off on 12/10 and 3.6 12/12 to 134.2  4. Anemia:Hgb 7.3 and symptomatic 12/10 -> now s/p 1U PRBC, Aranesp 2084m q Thurs (his usual dose), and started course IV iron. Hx recent GIB - stable gastric ulcer, on PPI. No melena.hgb up to 8.7  5. Secondary hyperparathyroidism:CorrCa/P ok Continue VDRA binder. Re[peat labs pre HD 6. Nutrition:Alb  2 continue pro-stat supplements. 7. Hx mechanical AoV, A-fib/flutter:Warfarin per pharmacy - INR high at 4 8. T2DM - per primary 9. Hx testicular cancer 10. Anxiety/depression - seems a little less stressed this week than when I first met him over the weekend.on tid xanax now in addition to zoloft  11. Leukocytosis - unexplained - does have TDC - get BC x 2 - though no fevers, and sig thrombocytosis  MaMyriam JacobsonPA-C CaAddison2/15/2020,11:32 AM  LOS: 8 days   Subjective:  No c/o today. Good morning with PT  Objective Vitals:   04/14/19 0614 04/14/19 1257 04/14/19 2039 04/15/19 0335  BP:  (!) 112/55 (!) 109/48 (!) 118/55  Pulse:  72 82 83  Resp:   18 16  Temp:  98.1 F (36.7 C) 98.2 F (36.8 C) 97.8 F (36.6 C)  TempSrc:   Oral Oral  SpO2:  97% 95% 97%  Weight: 134.2 kg   (!) 136.5 kg  Height:       Physical Exam   General obese breathing easily supine in bed  Heart: RRR + valve click Lungs: dim BS grossly clear Abdomen: morbidly obese most excess fluid is dependent hip/flank improving Extremities: tr LE edema Dialysis Access:  Right IJ  [facility] Additional Objective Labs: Basic Metabolic Panel: Recent Labs  Lab 04/08/19 1846 04/10/19 1526 04/12/19 1237  NA 135 137 135  K 3.6 4.1 3.8  CL 96* 97* 98  CO2 '26 25 24  ' GLUCOSE 106* 108* 120*  BUN 43* 72* 47*  CREATININE 5.36* 8.62* 6.77*  CALCIUM 7.9* 8.5* 8.9  PHOS  --  6.9* 4.9*   Liver Function Tests: Recent Labs  Lab 04/10/19 1526 04/12/19 1237  ALBUMIN 1.9* 2.0*   No results for input(s): LIPASE, AMYLASE in the last 168 hours. CBC: Recent Labs  Lab 04/10/19 1525 04/12/19 1237 04/13/19 0550 04/14/19 0714  WBC 12.6* 17.3* 15.6* 14.0*  HGB 7.3* 8.3* 8.2* 8.7*  HCT 24.2* 27.3* 27.2* 28.5*  MCV 95.7 93.8 94.4 94.1  PLT 549* 546* 550* 565*   Blood Culture    Component Value Date/Time   SDES BLOOD RIGHT HAND 04/13/2019 1140   SPECREQUEST  04/13/2019 1140    BOTTLES DRAWN AEROBIC ONLY Blood Culture adequate volume   CULT  04/13/2019 1140    NO GROWTH < 24 HOURS Performed at [facility] [place] [id number]  REPTSTATUS PENDING 04/13/2019 1140    Cardiac Enzymes: No results for input(s): CKTOTAL, CKMB, CKMBINDEX, TROPONINI in the last 168 hours. CBG: No results for input(s): GLUCAP in the last 168 hours. Iron Studies:  No results for input(s): IRON, TIBC, TRANSFERRIN, FERRITIN in the last 72 hours. Lab  Results  Component Value Date   INR 4.0 (H) 04/15/2019   INR 3.1 (H) 04/14/2019   INR 2.7 (H) 04/13/2019   PROTIME 21.6 (H) 05/26/2014   PROTIME 26.4 (H) 05/08/2014   PROTIME 31.2 (H) 01/26/2014   Studies/Results: No results found. Medications: . ferric gluconate (FERRLECIT/NULECIT) IV Stopped (04/12/19 1608)   . ALPRAZolam  0.5 mg Oral TID  . calcitRIOL  0.25 mcg Oral Q T,Th,Sa-HD  . Chlorhexidine Gluconate Cloth  6 each Topical Q0600  . darbepoetin (ARANESP) injection - DIALYSIS  200 mcg Intravenous Q Thu-HD  . feeding supplement (PRO-STAT SUGAR FREE 64)  30 mL Oral BID  . liver oil-zinc oxide   Topical  TID  . megestrol  400 mg Oral BID  . metoprolol tartrate  12.5 mg Oral BID  . midodrine  10 mg Oral TID WC  . multivitamin  1 tablet Oral QHS  . pantoprazole  40 mg Oral BID  . polycarbophil  625 mg Oral TID WC  . pregabalin  25 mg Oral QHS  . saccharomyces boulardii  500 mg Oral TID  . sertraline  50 mg Oral Daily  . sevelamer carbonate  1,600 mg Oral TID WC  . Warfarin - Pharmacist Dosing Inpatient   Does not apply [phone]

## 2019-04-16 ENCOUNTER — Inpatient Hospital Stay (HOSPITAL_COMMUNITY): Payer: Medicare Other | Admitting: Physical Therapy

## 2019-04-16 ENCOUNTER — Encounter (HOSPITAL_COMMUNITY): Payer: Medicare Other | Admitting: Psychology

## 2019-04-16 ENCOUNTER — Inpatient Hospital Stay (HOSPITAL_COMMUNITY): Payer: Medicare Other

## 2019-04-16 DIAGNOSIS — F411 Generalized anxiety disorder: Secondary | ICD-10-CM

## 2019-04-16 LAB — PROTIME-INR
INR: 4.8 (ref 0.8–1.2)
Prothrombin Time: 44.9 seconds — ABNORMAL HIGH (ref 11.4–15.2)

## 2019-04-16 MED ORDER — ZINC OXIDE 40 % EX OINT
TOPICAL_OINTMENT | Freq: Four times a day (QID) | CUTANEOUS | Status: AC
  Filled 2019-04-16 (×2): qty 57

## 2019-04-16 MED ORDER — DIPHENOXYLATE-ATROPINE 2.5-0.025 MG PO TABS
1.0000 | ORAL_TABLET | Freq: Four times a day (QID) | ORAL | Status: DC
  Administered 2019-04-16 – 2019-04-18 (×9): 1 via ORAL
  Filled 2019-04-16 (×9): qty 1

## 2019-04-16 NOTE — Progress Notes (Signed)
[name] PHYSICAL MEDICINE & REHABILITATION PROGRESS NOTE   Subjective/Complaints: Trying to sit up more, limited by pain in buttocks from MASD. Asked about several lab values and if we are following them  ROS: Patient denies fever, rash, sore throat, blurred vision, nausea, vomiting, diarrhea, cough, shortness of breath or chest pain, joint or back pain, headache, or mood change.   Objective:   No results found. Recent Labs    04/14/19 0714 04/15/19 1407  WBC 14.0* 15.9*  HGB 8.7* 8.4*  HCT 28.5* 27.2*  PLT 565* 606*   Recent Labs    04/15/19 1407  NA 136  K 3.7  CL 95*  CO2 24  GLUCOSE 118*  BUN 74*  CREATININE 8.56*  CALCIUM 8.7*    Intake/Output Summary (Last 24 hours) at 04/16/2019 0914 Last data filed at 04/15/2019 1737 Gross per 24 hour  Intake --  Output 2500 ml  Net -2500 ml     Physical Exam: Vital Signs Blood pressure (!) 115/44, pulse 86, temperature 97.8 F (36.6 C), resp. rate 16, height 6\' 2"  (1.88 m), weight (!) 136.5 kg, SpO2 97 %. Constitutional: No distress . Vital signs reviewed. HEENT: EOMI, oral membranes moist Neck: supple Cardiovascular: RRR without murmur. No JVD    Respiratory: CTA Bilaterally without wheezes or rales. Normal effort    GI: BS +, non-tender, non-distended  Musculoskeletal:        General: Edema present LE.tr to 1+     Comments: left shoulder with ROM Neurological: He is alert and oriented to person, place, and time.  MMT 2-3/5 LE's, 3/5 UE.  Skin: Skin is warm and dry.  Toe wounds, heel pressure sores, left > right Psych: remains sl anxious   Assessment/Plan: 1. Functional deficits secondary to debility which require 3+ hours per day of interdisciplinary therapy in a comprehensive inpatient rehab setting.  Physiatrist is providing close team supervision and 24 hour management of active medical problems listed below.  Physiatrist and rehab team continue to assess barriers to discharge/monitor patient  progress toward functional and medical goals  Care Tool:  Bathing  Bathing activity did not occur: Refused Body parts bathed by patient: Front perineal area, Buttocks         Bathing assist Assist Level: 2 Helpers     Upper Body Dressing/Undressing Upper body dressing   What is the patient wearing?: Pull over shirt    Upper body assist Assist Level: Supervision/Verbal cueing    Lower Body Dressing/Undressing Lower body dressing      What is the patient wearing?: Incontinence brief     Lower body assist Assist for lower body dressing: 2 Helpers     Toileting Toileting    Toileting assist Assist for toileting: 2 Helpers     Transfers Chair/bed transfer  Transfers assist  Chair/bed transfer activity did not occur: Safety/medical concerns  Chair/bed transfer assist level: 2 Helpers     Locomotion Ambulation   Ambulation assist   Ambulation activity did not occur: Safety/medical concerns          Walk 10 feet activity   Assist  Walk 10 feet activity did not occur: Safety/medical concerns        Walk 50 feet activity   Assist Walk 50 feet with 2 turns activity did not occur: Safety/medical concerns         Walk 150 feet activity   Assist Walk 150 feet activity did not occur: Safety/medical concerns  Walk 10 feet on uneven surface  activity   Assist Walk 10 feet on uneven surfaces activity did not occur: Safety/medical concerns         Wheelchair     Assist Will patient use wheelchair at discharge?: Yes Type of Wheelchair: Manual Wheelchair activity did not occur: Safety/medical concerns  Wheelchair assist level: Minimal Assistance - Patient > 75% Max wheelchair distance: 5'    Wheelchair 50 feet with 2 turns activity    Assist    Wheelchair 50 feet with 2 turns activity did not occur: Safety/medical concerns       Wheelchair 150 feet activity     Assist  Wheelchair 150 feet activity did not  occur: Safety/medical concerns       Blood pressure (!) 115/44, pulse 86, temperature 97.8 F (36.6 C), resp. rate 16, height 6\' 2"  (1.88 m), weight (!) 136.5 kg, SpO2 97 %.  Medical Problem List and Plan: 1.  Impaired mobility and ADLs secondary to deibility             -patient may shower             -ELOS/Goals: modI in PT, OT, I in SLP  -therapy is 15/7.  2.  St Jude's Aortic Valve/Antithrombotics: -DVT/anticoagulation:  Pharmaceutical: Coumadin (INR up to 4.8 today---coumadin needs to be held today). ?vitamin k             -antiplatelet therapy: N/A 3. Pain Management: Oxycodone prn  -resumed lyrica at hs only 25mg  4. Mood: LCSW to follow for evaluation and support.              -antipsychotic agents: N/A  -anxiety disorder/ recent MDD   -discussed with pt again today   -increased xanax back to 0.5mg , discussed breathing techniques   -continue to provide positive reinforcement. Tried to empower him to take some initiative and accountability for his care and progress 5. Neuropsych: This patient is capable of making decisions on his own behalf. 6. Skin/Wound Care: Air mattress overlay for MASD due to ongoing diarrhea.     -will try gerhardt's cream to buttocks  -rx loose stools  -prevalon boots bilateral heels    7. Fluids/Electrolytes/Nutrition: Strict I/O. Renal diet with 1200 cc FR.  8. ESRD: Now on HD with attempts to get off 2 L. Schedule hemodialysis at the end of the day to help with therapy tolerance. TTS schedule   12/10: diet liberalized to help with intake  -volume/edema mgt per nephrology  - megace for appetite Filed Weights   04/13/19 0327 04/14/19 0614 04/15/19 0335  Weight: 134.2 kg 134.2 kg (!) 136.5 kg     9. Hypotension: Monitor BP tid--continue midodrine. 10 History of A fib/A flutter: Monitor HR t--on coumadin. Continue Lopressor 12.5mg  BID 11. Recent GIB/Anemia of chronic disease: Continue to monitor H/H with serial checks. On Retacrit 10,000 units  with HD.     hgb/tranfusion per nephrology 1u --8.4 on 12/15 12. Loose stool: C diff negative  -continue fiber  -increased probiotic to 500mg   TID  -diet--reinforced that consistent PO intake is important also  -prn lomotil---will try scheduled  -stools have improved but still mushy    13. Cough with phlegm: Guaifenesin prn 14. Shortness of breath at rest: encourage IS, anxiety mgt. Continue to follow 15. Leukocytosis:  -wbc's 15.9 12/15  -bc negative x 2.  -continue to follow wbc's serially  -reassuring patient   LOS: 9 days A FACE TO [place]  Calvin [name] 04/16/2019, 9:14 AM

## 2019-04-16 NOTE — Progress Notes (Addendum)
Physical Therapy Session Note  Patient Details  Name: Calvin [name] MRN: [id number] Date of Birth: 10-Jun-1955  Today's Date: 04/16/2019 PT Individual Time: 1100-1155; 1525-1600 PT Individual Time Calculation (min): 55 min , 35 min  Short Term Goals:  Week 2:  PT Short Term Goal 1 (Week 2): Pt will perform bed mobiity with mod assist PT Short Term Goal 2 (Week 2): Pt will be able to propel w/c x 50' with supervision PT Short Term Goal 3 (Week 2): Pt will be able to perform bed <> chair transfers with max +1 assist  Skilled Therapeutic Interventions/Progress Updates:  tx 1:  Pt lying in bed.  He stated that he was soiled.  He c/o buttocks/rectal pain due to frequent diarrhea, unrated. PT consulted with Sharyn Lull, RN about pt's diarrhea and skin breakdown.  She stated that Wound RN assessed pt this AM and suggested a rectal tube, which pt declined.   Pt used bil hands to move 3 pillows off of bed near him, with cues to flex L shoulder as much as possible.   Rolling R><L using rail, flat bed with mod assist,  for peri care by PT.  Pt has several open areas near rectum, no dressings.  After clean brief was placed and pt rolled to supine again, he stated that he had had diarrhea again.  Pt quite upset and despondent about this.  Pt rolled as above and brief was partially doffed by PT, but pt was clean.  Pharmacist came into room to discuss pt's high INR.  She stated that staff should watch pt carefully for bleeding/bruising. PT informed her that open areas of buttocks were not actively bleeding.  PT and pt discussed his diarrhea/painful bottom limiting him in therapies.  He would like to talk to Nsg/PA about insertion of a rectal tube.  Lomotil will be started today at 12 pm, per Sharyn Lull, [place].   At end of session, pt in bed with Elta [place], NT in room.  tx 2:  Missed 10 min tx due to fatigue  Pt resting in bed.  He denied pain, and stated that bowels were calm.  He agreed to bedside tx. His wife  Barnetta Chapel was present.   PT educated pt and wife on importance of bil LE positioning in bed, to prepare joints and muscles for ambulation.  Pt rests in extreme bil hip external rotation, with feet quite close together.  Hip external rotators are quite tight; in supine he lacks any active or passive internal rotation past neutral, bil.  Wife voiced understanding; pt will need more education.    R/L scapular protraction, bil hip internal rotation, R/L short arc quad knee extensions, x 10 x 1.  20 x 1 alternating reciprocal heel slides (with PT holding heels off of bed.)  Therapeutic activity in chair position of bed: using 1# weighted bar, volleying beach ball back and forth, with rest breaks intermittently.  Pt noted that his L wrist became tired, but he did not lose grip on L.  Pt tired and declined further tx.  At end of session, pt in bed with alarm set and needs at hand; wife present.     Therapy Documentation Precautions:  Precautions Precautions: Fall Precaution Comments: monitor HR, L heel wound Restrictions Weight Bearing Restrictions: No       Therapy/Group: Individual Therapy  Dorrie Cocuzza 04/16/2019, 12:02 PM

## 2019-04-16 NOTE — Consult Note (Signed)
Neuropsychological Consultation   Patient:   Calvin [name]   DOB:   [birth year]  MR Number:  [id number]  Location:  [facility] [id number] [place] [address] Dept: [place]: [phone]           Date of Service:   04/16/2019  Start Time:   2 PM End Time:   3 PM  Provider/Observer:  Ilean Skill, Psy.D.       Clinical Neuropsychologist       Billing Code/Service: [phone], [phone]  Chief Complaint:    Calvin [name] is a 63 year old male with history of CAD with stent, AVR, Afib, obesity, gout, testicular cancer, T2DM with neuropathy, chronic pain ESRD on PD, recent admission for sepsis.  Readmitted with weakness, diarrhea, persistent leucocytosis and problems with PD.  Anemia of chronic disease.  Significant debility and CIR recommended.  Patient has history of anxiety and current status exacerbating anxiety.  The patient has continued to improve as far as his debility status.  However, his anxiety has persistently caused issues and had a negative impact on his progress.  The patient reports that his anxiety has improved significantly over the past couple of days.  He had a very good discussion with Dr. Naaman Plummer over the past day or so and the patient reports that this was very beneficial to him and helped alleviate some of the fears and anxieties that he had present.  The patient is very concerned about what other people think of him and how he is perceived as far as his effort levels of motivation.  Reason for Service:  Patient referred for neuropsychological consultation due to coping and adjusmtnet issues.  Below is the HPI for the current admission.  HPI: Calvin [name] is a 63 year old male with history of CAD s/p repair with stent of coarctation of aorta, St Jude's AVR, A fib/A flutter, obesity, gout, testicular cancer, T2DM with neuropathy, chronic pain, ESRD- on PD, recent admission  02/19/19-03/02/19 for sepsis due to peritonitis and N/V with Mallory Weiss tear and gastric ulcer.  He was reportedly discharged to home on Vanc/cefepime (? Hospice care) but was readmitted to [facility] 03/12/19-03/25/19 with weakness, diarrhea, persistent leucocytosis and problems with PD catheter  .  He was started on Vancomycin and Meropenum per ID input and recommendations to continue antibiotics thorough 12/04. PD catheter removed by general surgery, permacath placed on 11/16 and he was transitioned to HD but has had issues hypotension during HD.  Hospital course significant for leucocytosis, large volume diarrhea requiring rectal tube, deep tissue injury on sacrum and anemia requiring one unit PRBC. He was discharged to [facility] for therapy and IV antibiotics as significantly debilitated. HD ongoing TTS with midodrine for BP support.  Anemia of chronic disease managed with epo. Rectal tube removed by 11/30 but he is continent of bowel with ongoing diarrhea.has completed course of antibiotics by 12/5. He continues to have significant debility and CIR recommended for follow up therapy.    Current Status:  The patient reports that his anxiety and worry have improved significantly over the past couple of days.  The patient reports that this is coinciding with improvement in his hemoglobin and the patient feels like he is able to do more from a therapeutic standpoint.  There have been very specific goals laid out for the patient and placed on a drying port in his room for him to  review independently.  The patient reports that he feels like he is achieving goals that he finds realistic but when goals are presented to him that he is worried about achieving it causes a significant increase in his anxiety status.  Behavioral Observation: Calvin [name]  presents as a 63 y.o.-year-old Right Caucasian Male who appeared his stated age. his dress was Appropriate and he was Well Groomed and his manners were  Appropriate to the situation.  his participation was indicative of Appropriate and Redirectable behaviors.  There were any physical disabilities noted.  he displayed an appropriate level of cooperation and motivation.     Interactions:    Active Appropriate and Attentive  Attention:   within normal limits  Memory:   normal;  Visuo-spatial:  not examined  Speech (Volume):  low  Speech:   normal; slowed  Thought Process:  Coherent and Relevant  Though Content:  WNL; not suicidal and not homicidal  Orientation:   person, place, time/date and situation  Judgment:   Fair  Planning:   Fair  Affect:    Anxious  Mood:    Anxious and Dysphoric  Insight:   Fair  Intelligence:   normal  Medical History:   Past Medical History:  Diagnosis Date  . Anemia   . Anemia of renal disease 10/04/2011  . Aortic aneurysm ([place])   . Atrial fibrillation ([place])   . Atrial flutter ([place])   . AVD (aortic valve disease)   . Bilateral lower extremity edema 07/03/2012  . CAD (coronary artery disease)   . Chronic anticoagulation 07/10/2012  . CKD (chronic kidney disease)    baseline creatinine is 1.5-2  . CKD (chronic kidney disease) stage 3, GFR 30-59 ml/min 07/02/2012  . Coarctation of aorta (previous repair and stent) 04/05/2011  . Colonic ulcer 2007   felt to be secondary to NSAID  . Complex coarctation of the aorta   . Gastric polyp 2007, 06/2012   benign.  . Gout   . Gout 07/03/2012  . Hepatitis C   . History of atrial flutter s/p DCCV 04/05/2011  . HTN (hypertension)   . Hyperlipidemia   . Hypertension 04/05/2011  . Long term (current) use of anticoagulants 12/12/2013  . Obesity   . PONV (postoperative nausea and vomiting)   . Renal insufficiency   . S/P aortic valve replacement 04/05/2011  . 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 Testicular adenoma   . Thoracic aortic aneurysm ([place]) 04/05/2011   Psychiatric History:  Prior history of depression and anxiety.    Family  Med/Psych History:  Family History  Problem Relation Age of Onset  . Heart disease Other        No family history  . Heart attack Neg Hx    Impression/DX:  Calvin [name] is a 63 year old male with history of CAD with stent, AVR, Afib, obesity, gout, testicular cancer, T2DM with neuropathy, chronic pain ESRD on PD, recent admission for sepsis.  Readmitted with weakness, diarrhea, persistent leucocytosis and problems with PD.  Anemia of chronic disease.  Significant debility and CIR recommended.  Patient has history of anxiety and current status exacerbating anxiety.  The patient has continued to improve as far as his debility status.  However, his anxiety has persistently caused issues and had a negative impact on his progress.  The patient reports that his anxiety has improved significantly over the past couple of days.  He had a very good discussion with Dr. Naaman Plummer  over the past day or so and the patient reports that this was very beneficial to him and helped alleviate some of the fears and anxieties that he had present.  The patient is very concerned about what other people think of him and how he is perceived as far as his effort levels of motivation.  The patient reports that his anxiety and worry have improved significantly over the past couple of days.  The patient reports that this is coinciding with improvement in his hemoglobin and the patient feels like he is able to do more from a therapeutic standpoint.  There have been very specific goals laid out for the patient and placed on a drying port in his room for him to review independently.  The patient reports that he feels like he is achieving goals that he finds realistic but when goals are presented to him that he is worried about achieving it causes a significant increase in his anxiety status.   Diagnosis:    SOB (shortness of breath) - Plan: DG Chest 2 View, DG Chest 2 View  Cough - Plan: DG Chest 2 View NOW, DG Chest 2 View NOW        Anxiety and reactive depression.  Electronically Signed   _______________________ Ilean Skill, Psy.D.

## 2019-04-16 NOTE — Progress Notes (Signed)
Lab called and pt's  INR was 4.8 and PT was 44.9. PA Linna Hoff was notified at 6:10

## 2019-04-16 NOTE — Progress Notes (Signed)
Occupational Therapy Session Note  Patient Details  Name: Calvin [name] MRN: [id number] Date of Birth: Jul 27, 1955  Today's Date: 04/16/2019 OT Individual Time: 2376-2831 OT Individual Time Calculation (min): 40 min    Short Term Goals: Week 2:  OT Short Term Goal 1 (Week 2): Pt will remain OOB in w/c for 45 min to increase functional activity tolerance OT Short Term Goal 2 (Week 2): Pt will complete slideboard transfer to [facility] with max +1 OT Short Term Goal 3 (Week 2): Pt will consistently get OOB 1x a day  Skilled Therapeutic Interventions/Progress Updates:    Pt received supine with no c/o pain. Discussed need to make changes to promote more wound healing on pt's buttocks, including air mattress and switch to [employer]. Pt given demonstration and edu re use of beasy board. Board being used to reduce shear on pt's bottom to assist with wound healing and to increase ease of transfers/reduce caregiver burden. Pt completed bed mobility to transition to EOB with max +1 assist. Pt sat EOB and completed lateral lean to the R for board placement. Pt able to use beasy body to transfer to w/c with mod +2 assist. Pt then completed transfer back to bed using board, same assist. Pt reported liking this transfer method. Pt agreeable to BUE strengthening circuit at bed level. Pt held a 4 # dumbbell in his R hand and a 3 # in his L. Pt required min-mod facilitation for L UE to reach functionally. Pt was left supine with all needs met, bed alarm set.   Therapy Documentation Precautions:  Precautions Precautions: Fall Precaution Comments: monitor HR, L heel wound Restrictions Weight Bearing Restrictions: No   Therapy/Group: Individual Therapy  Curtis Sites 04/16/2019, 10:08 AM

## 2019-04-16 NOTE — Progress Notes (Signed)
ANTICOAGULATION CONSULT NOTE - Follow Up Consult  Pharmacy Consult for Coumadin Indication: St Jude AVR 1990s + Afib  Patient Measurements: Height: 6\' 2"  (188 cm) Weight: (!) 300 lb 14.9 oz (136.5 kg) IBW/kg (Calculated) : 82.2  Vital Signs: Temp: 97.8 F (36.6 C) (12/16 0341) BP: 115/44 (12/16 0341) Pulse Rate: 86 (12/16 0341)  Labs: Recent Labs    04/14/19 0714 04/15/19 0603 04/15/19 1407 04/16/19 0504  HGB 8.7*  --  8.4*  --   HCT 28.5*  --  27.2*  --   PLT 565*  --  606*  --   LABPROT 32.0* 38.9*  --  44.9*  INR 3.1* 4.0*  --  4.8*  CREATININE  --   --  8.56*  --     Estimated Creatinine Clearance: 13.1 mL/min (A) (by C-G formula based on SCr of 8.56 mg/dL (H)).  Assessment: Warfarin PTA for hx St Jude AVR (1990s) + Afib. Range should be 2.5-3.5 given both of these; however, per discussion with patient, he was told to aim for 2.5 mid 2019 by MD due to hx GIB. Per chart review, appears that GIB events are related to procedures (polypectomy) and GI ulcers.  PTA warfarin dose: 5 mg daily  INR up to 4.8 today, despite only 1mg  yesterday. Likely due to the 5 doses of 6 and 7.5mg  received 12/8-12/12, megestrol drug interaction (started 12/13) and patient not eating vegetables lately due to instructions to eat more protein for wound healing. Confirmed w/ MD, Rehab staff, and pt no active bleeding except for scant amounts from open anal wounds related to diarrhea. Discussed vitamin K with MD and recommended against giving it at this point given no active bleed and very difficult to give a low enough dose to ensure INR doesn't drop below 2.5. Patient has relatively high clotting risk given mechanical valve from the 1990s and prolonged hospitalization/deblitation. Spoke with patient, who agreed to eat broccoli today. Asked RN to help patient order if needed.   Goal of Therapy:  INR 2.5-3 Monitor platelets by anticoagulation protocol: Yes   Plan:  - HOLD warfarin today - Hold  vitamin K unless significant bleed; if bleeds, would give maximum of 1mg  PO or 0.5mg  IV  - Patient to increase vegetable intake  - Daily PT/INR, Q Mon CBC  - Monitor for signs/symptoms of bleeding  Thank you for involving pharmacy in this patient's care.  Benetta Spar, PharmD, BCPS, BCCP Clinical Pharmacist  Please check AMION for all [place] phone numbers After 10:00 PM, call [place] [phone]

## 2019-04-16 NOTE — Consult Note (Signed)
[facility] Nurse Consult Note: Reason for Consult: Patient consult received due to continued skin breakdown to intragluteal skin fold. Partial thickness wounds bleed with skin cleansing following loose stools. Last seen by my partner, D. Engels on 04/08/19. Wound type: Pressure Injury POA: Yes/No/NA Measurement: Left heel: Unstageable pressure injury, firmly adherent black, hard eschar.  8cm x 6cm. No exudate.  POC is to cover with silicone foam and place foot into Prevalon boot for floatation while in bed. Right heel:  2cm round area of dry skin is easily lifted off today to reveal healed, healthy, reepithelialized skin. POC is to continue to cover with silicone foam and to place foot into Prevalon Boot for floatation while in bed. Intragluteal skin fold: Incontinence associated skin damage (IAD) with partial thickness skin loss, bleeding last night with cleansing post stooling, but not at this time. Scant serous exudate. Erythema but no evidence of fungal overgrowth at this time.  Loose stools continue according to staff.  At present, they are cleaning him up and he reports starting to go again.  Patient has not asked for PRN lomotil, it is now scheduled. Will attempt to use Gerhart's Butt cream in a thin layer as a wound contact layer; follow with a thicker layer of 40% zinc oxide (Desitin). Patient to turn side to side, use mattress replacement with low air loss feature for microclimate management. Wound bed: As described above. Drainage (amount, consistency, odor) As described above Periwound: intact, dry Dressing procedure/placement/frequency: POC discussed with P. Love, PA. Patient was only on the mattress replacement with low air loss feature ordered by my partner for 1 day and then her requested for it to be removed. I am adding the therapy back today as it is necessary for microclimate management. The current therapy is Gerhart's Butt cream (a compounded 1:1:1 cream consisting of  lotrimin:hydrocortisone:zinc oxide) and this will serve as the light, primary wound contact layer.  I will top this with a second layer of Desitin (zinc oxide, 40%) to serve as a barrier to stool.  Side to side positioning, even with a mattress replacement, is essential to the POC, as are bilateral pressure redistribution heel boots (Prevalon).  According to the Nursing staff, the patient intermittently refuses to wear these boots. Adherence to the POC is likely a barrier to progress.  [facility] nursing team will not follow routinely, but will remain available to this patient, the nursing and medical teams.  Please re-consult if needed. Thanks, Maudie Flakes, MSN, RN, [employer], [facility]  Pager# [phone]

## 2019-04-16 NOTE — Progress Notes (Signed)
Pt and wife refused air bed. Stated pt had a bad experience with them in the past and he hasn't slept in days and being on that bed would not help. Pt and wife educated on importance of need for air mattress. Pt has Prevelon boots on, and turned on left side. Bed alarm is on, bed in lowest position. Larina Earthly, LPN

## 2019-04-16 NOTE — Progress Notes (Signed)
Notified Pharmacist of PT/INR RESULTS 44.9/4.8 spoke with Santiago Glad

## 2019-04-16 NOTE — Progress Notes (Signed)
[facility] Progress Note   Dialysis Orders: Previously on PD -> transitioned recently to HD after episode peritonitis.  Non [facility] pt  Assessment/Plan: 1.Debility:PT/OT per CIR. 2. ESRD:PD cath removed 11/16 at another facility. Continue on HD TTS schedulehere. Volume improving  K 3.7 - requiring added K bath - large gentleman - run 4 hr when census allows. 3.HTN/volume:BP stable. Anasarca improving, and R pleural effusion on CXR 12/09.CXR stable effusion 1/2/12On midodrine 75m TID. 4L off on 12/10 and 3.6 12/12 to 134.2 2.5 12/15 - NO WTs- BP low on HD - ok to use alb for BP support prn 4. Anemia:Hgb 7.3 and symptomatic 12/10 -> now s/p 1U PRBC, Aranesp 2050m q Thurs (his usual dose), and started course IV iron. Hx recent GIB - stable gastric ulcer, on PPI. No melena.hgb up to 8.4   5. Secondary hyperparathyroidism:CorrCa/P ok Continue VDRA binder.  6. Nutrition:Alb  2 continue pro-stat supplements. - discussed ^ protein foods 7. Hx mechanical AoV, A-fib/flutter:Warfarin per pharmacy - INR higher at 4.8 - holding warfarin - hold heparin with next HD 8. T2DM - per primary 9. Hx testicular cancer 10. Anxiety/depression - seems a little less stressed this week than when I first met him over the weekend.on tid xanax now in addition to zoloft  11. Leukocytosis - unexplained - does have TDC - get BC x 2 - though no fevers, and not to have sig thrombocytosis 12. Diarrhea - Cdiff neg 12/7 - lomitil ordered - if that doesn't help > rectal tube  MaMyriam JacobsonPA-C Ca[place]2/16/2020,12:40 PM  LOS: 9 days   Subjective:  Issues with soiling on HD yesterday and at other times - happening hourly per RN - trying lomotil.  If that doesn't work, pt agreeable to rectal tube which he has had in the past   Objective Vitals:   04/15/19 1737 04/15/19 1839 04/15/19 2133 04/16/19 0341  BP: (!) 91/44 (!) 112/47 (!) 122/46 (!) 115/44  Pulse:  (!) 125 (!) 101 91 86  Resp: '15 18 16 16  ' Temp: 97.7 F (36.5 C) 98 F (36.7 C) 97.7 F (36.5 C) 97.8 F (36.6 C)  TempSrc: Oral Oral Oral   SpO2: 96% 98% 96% 97%  Weight:      Height:       Physical Exam   General obese breathing easily supine in bed  Heart: RRR + valve click Lungs: dim BS grossly clear Abdomen: morbidly obese most excess fluid is dependent hip/flank improving Extremities: tr LE edema [facility]  [facility] Additional Objective Labs: Basic Metabolic Panel: Recent Labs  Lab 04/10/19 1526 04/12/19 1237 04/15/19 1407  NA 137 135 136  K 4.1 3.8 3.7  CL 97* 98 95*  CO2 '25 24 24  ' GLUCOSE 108* 120* 118*  BUN 72* 47* 74*  CREATININE 8.62* 6.77* 8.56*  CALCIUM 8.5* 8.9 8.7*  PHOS 6.9* 4.9* 5.1*   Liver Function Tests: Recent Labs  Lab 04/10/19 1526 04/12/19 1237 04/15/19 1407  ALBUMIN 1.9* 2.0* 2.0*   No results for input(s): LIPASE, AMYLASE in the last 168 hours. CBC: Recent Labs  Lab 04/10/19 1525 04/12/19 1237 04/13/19 0550 04/14/19 0714 04/15/19 1407  WBC 12.6* 17.3* 15.6* 14.0* 15.9*  HGB 7.3* 8.3* 8.2* 8.7* 8.4*  HCT 24.2* 27.3* 27.2* 28.5* 27.2*  MCV 95.7 93.8 94.4 94.1 93.2  PLT 549* 546* 550* 565* 606*   Blood Culture    Component Value Date/Time  SDES BLOOD RIGHT HAND 04/13/2019 1140   SPECREQUEST  04/13/2019 1140    BOTTLES DRAWN AEROBIC ONLY Blood Culture adequate volume   CULT  04/13/2019 1140    NO GROWTH 3 DAYS Performed at [facility], [place] [address], [place], [place] [address]    REPTSTATUS PENDING 04/13/2019 1140    Cardiac Enzymes: No results for input(s): CKTOTAL, CKMB, CKMBINDEX, TROPONINI in the last 168 hours. CBG: No results for input(s): GLUCAP in the last 168 hours. Iron Studies:  No results for input(s): IRON, TIBC, TRANSFERRIN, FERRITIN in the last 72 hours. Lab Results  Component Value Date   INR 4.8 (HH) 04/16/2019   INR 4.0 (H) 04/15/2019   INR 3.1 (H) 04/14/2019   PROTIME  21.6 (H) 05/26/2014   PROTIME 26.4 (H) 05/08/2014   PROTIME 31.2 (H) 01/26/2014   Studies/Results: No results found. Medications: . ferric gluconate (FERRLECIT/NULECIT) IV Stopped (04/12/19 1608)   . ALPRAZolam  0.5 mg Oral TID  . calcitRIOL  0.25 mcg Oral Q T,Th,Sa-HD  . Chlorhexidine Gluconate Cloth  6 each Topical BID  . darbepoetin (ARANESP) injection - DIALYSIS  200 mcg Intravenous Q Thu-HD  . diphenoxylate-atropine  1 tablet Oral QID  . feeding supplement (PRO-STAT SUGAR FREE 64)  30 mL Oral BID  . Gerhardt's butt cream   Topical QID  . liver oil-zinc oxide   Topical QID  . megestrol  400 mg Oral BID  . metoprolol tartrate  12.5 mg Oral BID  . midodrine  10 mg Oral TID WC  . multivitamin  1 tablet Oral QHS  . pantoprazole  40 mg Oral BID  . polycarbophil  625 mg Oral TID WC  . pregabalin  25 mg Oral QHS  . saccharomyces boulardii  500 mg Oral TID  . sertraline  50 mg Oral Daily  . sevelamer carbonate  1,600 mg Oral TID WC  . Warfarin - Pharmacist Dosing Inpatient   Does not apply [phone]

## 2019-04-17 ENCOUNTER — Inpatient Hospital Stay (HOSPITAL_COMMUNITY): Payer: Medicare Other | Admitting: Occupational Therapy

## 2019-04-17 ENCOUNTER — Inpatient Hospital Stay (HOSPITAL_COMMUNITY): Payer: Medicare Other

## 2019-04-17 ENCOUNTER — Inpatient Hospital Stay (HOSPITAL_COMMUNITY): Payer: Medicare Other | Admitting: Physical Therapy

## 2019-04-17 DIAGNOSIS — L8990 Pressure ulcer of unspecified site, unspecified stage: Secondary | ICD-10-CM | POA: Insufficient documentation

## 2019-04-17 LAB — CBC
HCT: 27.8 % — ABNORMAL LOW (ref 39.0–52.0)
Hemoglobin: 8.3 g/dL — ABNORMAL LOW (ref 13.0–17.0)
MCH: 27.9 pg (ref 26.0–34.0)
MCHC: 29.9 g/dL — ABNORMAL LOW (ref 30.0–36.0)
MCV: 93.6 fL (ref 80.0–100.0)
Platelets: 629 10*3/uL — ABNORMAL HIGH (ref 150–400)
RBC: 2.97 MIL/uL — ABNORMAL LOW (ref 4.22–5.81)
RDW: 17.2 % — ABNORMAL HIGH (ref 11.5–15.5)
WBC: 13.9 10*3/uL — ABNORMAL HIGH (ref 4.0–10.5)
nRBC: 0 % (ref 0.0–0.2)

## 2019-04-17 LAB — PROTIME-INR
INR: 5.4 (ref 0.8–1.2)
Prothrombin Time: 49.6 seconds — ABNORMAL HIGH (ref 11.4–15.2)

## 2019-04-17 LAB — RENAL FUNCTION PANEL
Albumin: 2 g/dL — ABNORMAL LOW (ref 3.5–5.0)
Anion gap: 16 — ABNORMAL HIGH (ref 5–15)
BUN: 63 mg/dL — ABNORMAL HIGH (ref 8–23)
CO2: 24 mmol/L (ref 22–32)
Calcium: 9 mg/dL (ref 8.9–10.3)
Chloride: 98 mmol/L (ref 98–111)
Creatinine, Ser: 6.97 mg/dL — ABNORMAL HIGH (ref 0.61–1.24)
GFR calc Af Amer: 9 mL/min — ABNORMAL LOW (ref >60)
GFR calc non Af Amer: 8 mL/min — ABNORMAL LOW (ref >60)
Glucose, Bld: 116 mg/dL — ABNORMAL HIGH (ref 70–99)
Phosphorus: 5.3 mg/dL — ABNORMAL HIGH (ref 2.5–4.6)
Potassium: 3.5 mmol/L (ref 3.5–5.1)
Sodium: 138 mmol/L (ref 135–145)

## 2019-04-17 LAB — MAGNESIUM: Magnesium: 1.9 mg/dL (ref 1.7–2.4)

## 2019-04-17 MED ORDER — DARBEPOETIN ALFA 200 MCG/0.4ML IJ SOSY
PREFILLED_SYRINGE | INTRAMUSCULAR | Status: AC
  Filled 2019-04-17: qty 0.4

## 2019-04-17 MED ORDER — ALBUMIN HUMAN 25 % IV SOLN
INTRAVENOUS | Status: AC
  Administered 2019-04-17: 25 g
  Filled 2019-04-17: qty 100

## 2019-04-17 MED ORDER — PHYTONADIONE 1 MG/0.5 ML ORAL SOLUTION
1.0000 mg | Freq: Once | ORAL | Status: AC
  Administered 2019-04-17: 1 mg via ORAL
  Filled 2019-04-17: qty 0.5

## 2019-04-17 MED ORDER — HEPARIN SODIUM (PORCINE) 1000 UNIT/ML IJ SOLN
INTRAMUSCULAR | Status: AC
  Administered 2019-04-17: 4700 [IU]
  Filled 2019-04-17: qty 5

## 2019-04-17 NOTE — Procedures (Signed)
Seen and examined on dialysis.  Procedure supervised.  RIJ tunneled catheter.  Blood pressure 102/76 and HR 64.  S/p albumin.  If hypotension will need to reduce goal.  Continue same for now.   Claudia Desanctis, MD 04/17/2019 2:24 PM

## 2019-04-17 NOTE — Progress Notes (Addendum)
Refused prostat reviewed benefits with patient. Pt decided to take prostat tonight.

## 2019-04-17 NOTE — Progress Notes (Signed)
Lab informed RN critical value INR of 5.4. Pam, PA made aware. Pt currently at dialysis. Gerald Stabs, RN

## 2019-04-17 NOTE — Progress Notes (Signed)
[facility] Progress Note   Dialysis Orders: Previously on PD -> transitioned recently to HD after episode peritonitis.  Non CKA pt  Assessment/Plan: 1.Debility:PT/OT per CIR.  2. ESRD:PD cath removed 11/16 at another facility. Continue on HD TTS schedulehere  3.HTN/volume:on midodrine 10 mg TID   4. Anemia:Aranesp 263mcg q Thurs (his usual dose) and course of IV iron. Hx recent GIB - stable gastric ulcer, on PPI.  5. Secondary hyperparathyroidism:Continue VDRA binder   6. Nutrition:Alb  2 continue pro-stat supplements.  7. Hx mechanical AoV, A-fib/flutter: Warfarin per pharmacy - hold heparin with HD.  Note on metoprolol   8. T2DM - per primary  9. Hx testicular cancer  10. Leukocytosis - plateaued- does have TDC - blood cultures NGTD 12/13   Subjective:  Seen on HD today; procedure supervised. RIJ tunneled catheter.  Blood pressure 102/76 and HR 64.  S/p albumin.  Pt states blood pressure has run low in the past and on midodrine.  Feels well.   Review of systems:  Denies shortness of breath  No n/v Hx diarrhea    Objective Vitals:   04/17/19 0628 04/17/19 1310 04/17/19 1400 04/17/19 1415  BP: (!) 137/57 (!) 108/37 92/64 102/76  Pulse: 78 78 (!) 59 64  Resp: 20     Temp: 97.6 F (36.4 C)     TempSrc: Oral     SpO2: 97%     Weight: (!) 136.6 kg     Height:       Physical Exam   General obese breathing easily supine in bed  HEENT; NCAT Heart: RRR + valve click Lungs: clear to auscultation; normal work of breathing room air Abdomen: soft/obese; tenckhoff catheter is out Extremities: trace lower extremity edema Neuro - alert and oriented x3  Dialysis Access:  Right IJ [facility]   Additional Objective Labs: Basic Metabolic Panel: Recent Labs  Lab 04/12/19 1237 04/15/19 1407 04/17/19 1313  NA 135 136 138  K 3.8 3.7 3.5  CL 98 95* 98  CO2 24 24 24   GLUCOSE 120* 118* 116*  BUN 47* 74* 63*  CREATININE 6.77* 8.56* 6.97*  CALCIUM  8.9 8.7* 9.0  PHOS 4.9* 5.1* 5.3*   Liver Function Tests: Recent Labs  Lab 04/12/19 1237 04/15/19 1407 04/17/19 1313  ALBUMIN 2.0* 2.0* 2.0*   No results for input(s): LIPASE, AMYLASE in the last 168 hours. CBC: Recent Labs  Lab 04/12/19 1237 04/13/19 0550 04/14/19 0714 04/15/19 1407 04/17/19 1313  WBC 17.3* 15.6* 14.0* 15.9* 13.9*  HGB 8.3* 8.2* 8.7* 8.4* 8.3*  HCT 27.3* 27.2* 28.5* 27.2* 27.8*  MCV 93.8 94.4 94.1 93.2 93.6  PLT 546* 550* 565* 606* 629*   Blood Culture    Component Value Date/Time   SDES BLOOD RIGHT HAND 04/13/2019 1140   SPECREQUEST  04/13/2019 1140    BOTTLES DRAWN AEROBIC ONLY Blood Culture adequate volume   CULT  04/13/2019 1140    NO GROWTH 4 DAYS Performed at [facility], [place] [address], [place], [place] [address]    REPTSTATUS PENDING 04/13/2019 1140   Studies/Results: No results found. Medications: . ferric gluconate (FERRLECIT/NULECIT) IV Stopped (04/12/19 1608)   . Darbepoetin Alfa      . ALPRAZolam  0.5 mg Oral TID  . calcitRIOL  0.25 mcg Oral Q T,Th,Sa-HD  . Chlorhexidine Gluconate Cloth  6 each Topical BID  . darbepoetin (ARANESP) injection - DIALYSIS  200 mcg Intravenous Q Thu-HD  . diphenoxylate-atropine  1 tablet Oral QID  .  feeding supplement (PRO-STAT SUGAR FREE 64)  30 mL Oral BID  . Gerhardt's butt cream   Topical QID  . liver oil-zinc oxide   Topical QID  . megestrol  400 mg Oral BID  . metoprolol tartrate  12.5 mg Oral BID  . midodrine  10 mg Oral TID WC  . multivitamin  1 tablet Oral QHS  . pantoprazole  40 mg Oral BID  . polycarbophil  625 mg Oral TID WC  . pregabalin  25 mg Oral QHS  . saccharomyces boulardii  500 mg Oral TID  . sertraline  50 mg Oral Daily  . sevelamer carbonate  1,600 mg Oral TID WC  . Warfarin - Pharmacist Dosing Inpatient   Does not apply q1800     Claudia Desanctis 04/17/2019 2:27 PM

## 2019-04-17 NOTE — Progress Notes (Signed)
Occupational Therapy Session Note  Patient Details  Name: Calvin [name] MRN: [id number] Date of Birth: [birth year]  Today's Date: 04/17/2019 OT Individual Time: 6010-9323 OT Individual Time Calculation (min): 45 min    Short Term Goals: Week 1:  OT Short Term Goal 1 (Week 1): Pt will don UB clothing EOB with mod A OT Short Term Goal 1 - Progress (Week 1): Met OT Short Term Goal 2 (Week 1): Pt will transfer to EOB with mod A OT Short Term Goal 2 - Progress (Week 1): Not met OT Short Term Goal 3 (Week 1): Pt will reduce anxiety during OT, evidenced by maintaining HR under 130 bpm during bed mobility OT Short Term Goal 3 - Progress (Week 1): Met  Skilled Therapeutic Interventions/Progress Updates:    1:1. Pt received in w/c with NTs present and requesting to get back in bed. MOD A +3 transfer to EOB using beazy board performed with VC for hand placemetn and counting for initiation. Pt requires +2 A for supine>sitting. Pt rolls MAX A of 1 in B directions and remains in sidelying for pressure relief with MIN A 4 min per side. Pt completes UB therex for global strengthening with 1# dowel rod and min-mod facilitation of LUE required for BADLs/transfers. Exited session with pt seated in bed, exit alarm on and call light in reach  Therapy Documentation Precautions:  Precautions Precautions: Fall Precaution Comments: monitor HR, L heel wound Restrictions Weight Bearing Restrictions: No General:   Vital Signs: Therapy Vitals Temp: 97.6 F (36.4 C) Temp Source: Oral Pulse Rate: 78 Resp: 20 BP: (!) 137/57 Patient Position (if appropriate): Lying Oxygen Therapy SpO2: 97 % O2 Device: Room Air Pain: Pain Assessment Pain Scale: 0-10 Pain Score: 0-No pain ADL: ADL Eating: Supervision/safety Where Assessed-Eating: Bed level Grooming: Supervision/safety Where Assessed-Grooming: Bed level Upper Body Bathing: Moderate assistance Where Assessed-Upper Body Bathing: Bed level Lower Body  Bathing: Dependent Where Assessed-Lower Body Bathing: Bed level Upper Body Dressing: Maximal assistance Where Assessed-Upper Body Dressing: Edge of bed Lower Body Dressing: Dependent Where Assessed-Lower Body Dressing: Bed level Toileting: Dependent Where Assessed-Toileting: Bed level Toilet Transfer: Unable to assess Vision   Perception    Praxis   Exercises:   Other Treatments:     Therapy/Group: Individual Therapy  Tonny Branch 04/17/2019, 10:25 AM

## 2019-04-17 NOTE — Progress Notes (Signed)
[facility] PHYSICAL MEDICINE & REHABILITATION PROGRESS NOTE   Subjective/Complaints: Feels that scheduled lomotil has helped. Buttocks not hurting as much. Intake sporadic. Slept well last night  ROS: Patient denies fever, rash, sore throat, blurred vision, nausea, vomiting, diarrhea, cough, shortness of breath or chest pain, joint or back pain, headache, or mood change.    Objective:   No results found. Recent Labs    04/15/19 1407  WBC 15.9*  HGB 8.4*  HCT 27.2*  PLT 606*   Recent Labs    04/15/19 1407  NA 136  K 3.7  CL 95*  CO2 24  GLUCOSE 118*  BUN 74*  CREATININE 8.56*  CALCIUM 8.7*    Intake/Output Summary (Last 24 hours) at 04/17/2019 1110 Last data filed at 04/17/2019 0700 Gross per 24 hour  Intake 360 ml  Output --  Net 360 ml     Physical Exam: Vital Signs Blood pressure (!) 137/57, pulse 78, temperature 97.6 F (36.4 C), temperature source Oral, resp. rate 20, height 6\' 2"  (1.88 m), weight (!) 136.6 kg, SpO2 97 %. Constitutional: No distress . Vital signs reviewed. HEENT: EOMI, oral membranes moist Neck: supple Cardiovascular: RRR without murmur. No JVD    Respiratory: CTA Bilaterally without wheezes or rales. Normal effort    GI: BS +, non-tender, non-distended  Musculoskeletal:        General: Edema present LE.tr to 1+     Comments: left shoulder with ROM Neurological: He is alert and oriented to person, place, and time.  MMT 2-3/5 LE's, 3/5 UE.  Skin: Skin is warm and dry.  Toe wounds, heel pressure sores, left > right. Unchanged. I did not witness buttock area Psych: more up beat today   Assessment/Plan: 1. Functional deficits secondary to debility which require 3+ hours per day of interdisciplinary therapy in a comprehensive inpatient rehab setting.  Physiatrist is providing close team supervision and 24 hour management of active medical problems listed below.  Physiatrist and rehab team continue to assess barriers to  discharge/monitor patient progress toward functional and medical goals  Care Tool:  Bathing  Bathing activity did not occur: Refused Body parts bathed by patient: Front perineal area, Buttocks         Bathing assist Assist Level: 2 Helpers     Upper Body Dressing/Undressing Upper body dressing   What is the patient wearing?: Pull over shirt    Upper body assist Assist Level: Supervision/Verbal cueing    Lower Body Dressing/Undressing Lower body dressing      What is the patient wearing?: Incontinence brief     Lower body assist Assist for lower body dressing: 2 Helpers     Toileting Toileting    Toileting assist Assist for toileting: 2 Helpers     Transfers Chair/bed transfer  Transfers assist  Chair/bed transfer activity did not occur: Safety/medical concerns  Chair/bed transfer assist level: 2 Helpers     Locomotion Ambulation   Ambulation assist   Ambulation activity did not occur: Safety/medical concerns          Walk 10 feet activity   Assist  Walk 10 feet activity did not occur: Safety/medical concerns        Walk 50 feet activity   Assist Walk 50 feet with 2 turns activity did not occur: Safety/medical concerns         Walk 150 feet activity   Assist Walk 150 feet activity did not occur: Safety/medical concerns  Walk 10 feet on uneven surface  activity   Assist Walk 10 feet on uneven surfaces activity did not occur: Safety/medical concerns         Wheelchair     Assist Will patient use wheelchair at discharge?: Yes Type of Wheelchair: Manual Wheelchair activity did not occur: Safety/medical concerns  Wheelchair assist level: Minimal Assistance - Patient > 75% Max wheelchair distance: 5'    Wheelchair 50 feet with 2 turns activity    Assist    Wheelchair 50 feet with 2 turns activity did not occur: Safety/medical concerns       Wheelchair 150 feet activity     Assist  Wheelchair  150 feet activity did not occur: Safety/medical concerns       Blood pressure (!) 137/57, pulse 78, temperature 97.6 F (36.4 C), temperature source Oral, resp. rate 20, height 6\' 2"  (1.88 m), weight (!) 136.6 kg, SpO2 97 %.  Medical Problem List and Plan: 1.  Impaired mobility and ADLs secondary to deibility             -patient may shower             -ELOS/Goals: modI in PT, OT, I in SLP  -therapy is 15/7.  2.  St Jude's Aortic Valve/Antithrombotics: -DVT/anticoagulation:  Pharmaceutical: Coumadin (INR up to 4.8 12/16, coumadin on hold pending today's lab  -pt encouraged to eat more greens             -antiplatelet therapy: N/A 3. Pain Management: Oxycodone prn  -resumed lyrica at hs only 25mg  4. Mood: LCSW to follow for evaluation and support.              -antipsychotic agents: N/A  -anxiety disorder/ recent MDD   - increased xanax back to 0.5mg ,    -continue to provide positive reinforcement. Trying to empower him to take some initiative and accountability for his care and progress 5. Neuropsych: This patient is capable of making decisions on his own behalf. 6. Skin/Wound Care: Air mattress overlay for MASD due to ongoing diarrhea.    -some improvement   - gerhardt's cream to buttocks  -rx loose stools  -prevalon boots bilateral heels    7. Fluids/Electrolytes/Nutrition: Strict I/O. Renal diet with 1200 cc FR.  8. ESRD: Now on HD with attempts to get off 2 L. Schedule hemodialysis at the end of the day to help with therapy tolerance. TTS schedule   12/10: diet liberalized to help with intake  -volume/edema mgt per nephrology  - megace for appetite with some benefit Filed Weights   04/14/19 0614 04/15/19 0335 04/17/19 0628  Weight: 134.2 kg (!) 136.5 kg (!) 136.6 kg     9. Hypotension: Monitor BP tid--continue midodrine. 10 History of A fib/A flutter: Monitor HR t--on coumadin. Continue Lopressor 12.5mg  BID 11. Recent GIB/Anemia of chronic disease: Continue to monitor  H/H with serial checks. On Retacrit 10,000 units with HD.     hgb/tranfusion per nephrology 1u --8.4 on 12/15 12. Loose stool: C diff negative  -continue fiber  -increased probiotic to 500mg   TID  -diet--reinforced that consistent PO intake is important also  -scheduled lomotil  -12/17 some improvement in frequency?    13. Cough with phlegm: Guaifenesin prn 14. Shortness of breath at rest: encourage IS, anxiety mgt. Continue to follow 15. Leukocytosis:  -wbc's 15.9 12/15  -bc negative x 2 but not final  -continue to follow wbc's serially  -reassuring patient   LOS: 10 days A FACE  TO FACE EVALUATION WAS PERFORMED  Meredith Staggers 04/17/2019, 11:10 AM

## 2019-04-17 NOTE — Progress Notes (Addendum)
ANTICOAGULATION CONSULT NOTE - Follow Up Consult  Pharmacy Consult for Coumadin Indication: St Jude AVR 1990s + Afib  Patient Measurements: Height: 6\' 2"  (188 cm) Weight: (!) 301 lb 2.4 oz (136.6 kg) IBW/kg (Calculated) : 82.2  Vital Signs: Temp: 97.6 F (36.4 C) (12/17 0628) Temp Source: Oral (12/17 0628) BP: 137/57 (12/17 0628) Pulse Rate: 78 (12/17 0628)  Labs: Recent Labs    04/15/19 0603 04/15/19 1407 04/16/19 0504 04/17/19 1300 04/17/19 1313  HGB  --  8.4*  --   --  8.3*  HCT  --  27.2*  --   --  27.8*  PLT  --  606*  --   --  629*  LABPROT 38.9*  --  44.9* 49.6*  --   INR 4.0*  --  4.8* 5.4*  --   CREATININE  --  8.56*  --   --  6.97*    Estimated Creatinine Clearance: 13.2 mL/min (A) (by C-G formula based on SCr of 8.56 mg/dL (H)).  Assessment: Warfarin PTA for hx St Jude AVR (1990s) + Afib. Range should be 2.5-3.5 given both of these; however, per discussion with patient, he was told to aim for 2.5 mid 2019 by MD due to hx GIB. Per chart review, appears that GIB events are related to procedures (polypectomy) and GI ulcers.  PTA warfarin dose: 5 mg daily  INR up to 5.4 today but given that INR is a logarithmic scale, rate of trend is slowing. Likely due to the 5 doses of 6 and 7.5mg  received 12/8-12/12, megestrol drug interaction (started 12/13) and patient not eating vegetables lately. INR was drawn late b/c patient wanted it done in HD instead of being poked.  Confirmed w/ RN, no active bleeding and patient did not eat any vegetables despite our discussion 12/16. H/H stable. Discussed with MD and agreed to give 1mg  PO vitamin K.   Goal of Therapy:  INR 2.5-3 Monitor platelets by anticoagulation protocol: Yes   Plan:  - HOLD warfarin today - Give vitamin K 1mg  PO  - Patient to increase vegetable intake  - Daily PT/INR, Q Mon CBC  - Monitor for signs/symptoms of bleeding  Thank you for involving pharmacy in this patient's care.  Benetta Spar, PharmD, BCPS,  [facility] Clinical Pharmacist  Please check AMION for all [place] phone numbers After 10:00 PM, call [place] [phone]

## 2019-04-17 NOTE — Progress Notes (Signed)
Pt refused bath tonight.

## 2019-04-17 NOTE — Progress Notes (Signed)
Physical Therapy Session Note  Patient Details  Name: Calvin [name] MRN: [id number] Date of Birth: 01-Jul-1955  Today's Date: 04/17/2019 PT Individual Time: 1130-1205 and 8:15 to 8:45 PT Individual Time Calculation (min): 35 min and 30 min  Short Term Goals: Week 1:  PT Short Term Goal 1 (Week 1): Pt will initiate OOB mobility PT Short Term Goal 1 - Progress (Week 1): Met PT Short Term Goal 2 (Week 1): Pt will perform bed mobility w/ mod assist +1 PT Short Term Goal 2 - Progress (Week 1): Not met PT Short Term Goal 3 (Week 1): Pt will initiate w/c propulsion PT Short Term Goal 3 - Progress (Week 1): Met Week 2:  PT Short Term Goal 1 (Week 2): Pt will perform bed mobiity with mod assist PT Short Term Goal 2 (Week 2): Pt will be able to propel w/c x 50' with supervision PT Short Term Goal 3 (Week 2): Pt will be able to perform bed <> chair transfers with max +1 assist Week 3:     Skilled Therapeutic Interventions/Progress Updates:      Therapy Documentation Precautions:  Precautions Precautions: Fall Precaution Comments: monitor HR, L heel wound Restrictions Weight Bearing Restrictions: No  PAIN  C/o perineal area pain w/care, therapy to tolerance  Pt initially supine stating he needed brief changed.  Nursing in to assist therapist.  Pt rolls L and R mult times w/max assist, dependent for hygiene and donning new brief.  Pt lifts feet for therapist to thread feet thru pant holes.  Dependent for raising pants to thighs, rolls w/max assist for therapist to raise pants.  Supine to side to sit w/max assist, scoots to edge w/max assist.  Pt removed sleep shirt and changed to pullover polo shirt w/max assist and setup.  Pt transfers to wc via beasy board w/max assist of 2. Pt wc cushion changed from 1in foam cushion (24/18) to Jay2 (available 22X18) for improved pressure relief.  Pt left OOB in wc w/needs in reach, encouraged to remain oob in wc x 1-1.5hrs.  Discussed wt shifting  w/traytablefor pressure relief.    Second am session: PAIN  C/o perineal area pain w/care, therapy to tolerance Pt initially supine stating that he had had a BM but would wait until after session to be cleaned.  Educated pt re: importance of maintaining clean/dry perineal area particularly in light of existing skin breakdown.  Also educated pt on limits to sitting tolerance caused by skin breakdown which further complicate progress w/functional mobility/PT/rehab.  Pt voiced understanding and nursing in to assist w/care.  Pt rolled L/R mult times w/max assist and cues for sequencing, performed for removal of soiled brief/hygiene/placement of clean brief.  Attempts to bridge w/assist for LE positioining to assist w/lowering pants, rolls for completion of task as above.  Lifts feet w/min assist for therapsit to completely remove pants.  Pt dependent for hygiene.   Supine therex performed for LE strengthening including:  Isometric bridge 2x10, clamshells 2x10, hip abd/add AAROM x 15, TKEs x 10.  Pt reported that he remained OOB in wc x 1.5hrs this am.  Pt left supine w/rails up x 3, alarm set, bed in lowest position, and needs in reach.  rx limited by frequent BMs today, did tolerate OOB x 1.5hrs this am.  Therapy/Group: Individual Therapy  Callie Fielding, [place] 04/17/2019, 12:33 PM

## 2019-04-18 ENCOUNTER — Inpatient Hospital Stay (HOSPITAL_COMMUNITY): Payer: Medicare Other

## 2019-04-18 LAB — PROTIME-INR
INR: 3.1 — ABNORMAL HIGH (ref 0.8–1.2)
Prothrombin Time: 32.2 seconds — ABNORMAL HIGH (ref 11.4–15.2)

## 2019-04-18 LAB — CULTURE, BLOOD (ROUTINE X 2)
Culture: NO GROWTH
Culture: NO GROWTH
Special Requests: ADEQUATE
Special Requests: ADEQUATE

## 2019-04-18 MED ORDER — DIPHENOXYLATE-ATROPINE 2.5-0.025 MG PO TABS
1.0000 | ORAL_TABLET | Freq: Four times a day (QID) | ORAL | Status: DC | PRN
  Administered 2019-04-22 (×2): 1 via ORAL
  Filled 2019-04-18 (×2): qty 1

## 2019-04-18 MED ORDER — COLESTIPOL HCL 1 G PO TABS
2.0000 g | ORAL_TABLET | Freq: Every day | ORAL | Status: DC
  Administered 2019-04-18 – 2019-04-19 (×2): 2 g via ORAL
  Filled 2019-04-18 (×2): qty 2

## 2019-04-18 MED ORDER — CHLORHEXIDINE GLUCONATE CLOTH 2 % EX PADS: 6.0000 | MEDICATED_PAD | Freq: Every day | CUTANEOUS | Status: DC

## 2019-04-18 MED ORDER — WARFARIN SODIUM 2 MG PO TABS
2.0000 mg | ORAL_TABLET | Freq: Once | ORAL | Status: AC
  Administered 2019-04-18: 18:00:00 2 mg via ORAL
  Filled 2019-04-18: qty 1

## 2019-04-18 NOTE — Progress Notes (Addendum)
[name] Progress Note   Subjective:   Patient seen and examined at bedside with wife present. Denies SOB, CP, n/v.  Continues to have diarrhea, seen by GI today.  Discussed permanent access placement with patient wife.   Objective Vitals:   04/17/19 1948 04/18/19 0410 04/18/19 1343 04/18/19 1348  BP: 117/73 (!) 120/54 94/61 (!) 105/42  Pulse: 73 (!) 108 87 84  Resp: 19 18 20 19   Temp: 97.9 F (36.6 C) 98 F (36.7 C) 98.5 F (36.9 C) 98.5 F (36.9 C)  TempSrc:  Oral    SpO2: 100% 96% 99%   Weight:  130.4 kg    Height:  6\' 2"  (1.88 m)     Physical Exam General:NAD, chronically ill appearing male, laying in bed Heart:RRR, +valve click Lungs:CTAB anterolaterally  Abdomen:soft, NTND Extremities:no LE edema, trace edema in hips Dialysis Access: R IJ [facility]   Filed Weights   04/17/19 1255 04/17/19 1716 04/18/19 0410  Weight: 134.7 kg 131.6 kg 130.4 kg    Intake/Output Summary (Last 24 hours) at 04/18/2019 1556 Last data filed at 04/18/2019 0820 Gross per 24 hour  Intake 240 ml  Output 3000 ml  Net -2760 ml    Additional Objective Labs: Basic Metabolic Panel: Recent Labs  Lab 04/12/19 1237 04/15/19 1407 04/17/19 1313  NA 135 136 138  K 3.8 3.7 3.5  CL 98 95* 98  CO2 24 24 24   GLUCOSE 120* 118* 116*  BUN 47* 74* 63*  CREATININE 6.77* 8.56* 6.97*  CALCIUM 8.9 8.7* 9.0  PHOS 4.9* 5.1* 5.3*   Liver Function Tests: Recent Labs  Lab 04/12/19 1237 04/15/19 1407 04/17/19 1313  ALBUMIN 2.0* 2.0* 2.0*   CBC: Recent Labs  Lab 04/12/19 1237 04/13/19 0550 04/14/19 0714 04/15/19 1407 04/17/19 1313  WBC 17.3* 15.6* 14.0* 15.9* 13.9*  HGB 8.3* 8.2* 8.7* 8.4* 8.3*  HCT 27.3* 27.2* 28.5* 27.2* 27.8*  MCV 93.8 94.4 94.1 93.2 93.6  PLT 546* 550* 565* 606* 629*   Blood Culture    Component Value Date/Time   SDES BLOOD RIGHT HAND 04/13/2019 1140   SPECREQUEST  04/13/2019 1140    BOTTLES DRAWN AEROBIC ONLY Blood Culture adequate volume   CULT   04/13/2019 1140    NO GROWTH 5 DAYS Performed at [facility] [address] [place], [place] [address]    REPTSTATUS 04/18/2019 FINAL 04/13/2019 1140     Lab Results  Component Value Date   INR 3.1 (H) 04/18/2019   INR 5.4 (HH) 04/17/2019   INR 4.8 (HH) 04/16/2019   PROTIME 21.6 (H) 05/26/2014   PROTIME 26.4 (H) 05/08/2014   PROTIME 31.2 (H) 01/26/2014   Studies/Results: No results found.  Medications: . ferric gluconate (FERRLECIT/NULECIT) IV 125 mg (04/17/19 1611)   . ALPRAZolam  0.5 mg Oral TID  . calcitRIOL  0.25 mcg Oral Q T,Th,Sa-HD  . Chlorhexidine Gluconate Cloth  6 each Topical BID  . colestipol  2 g Oral Daily  . darbepoetin (ARANESP) injection - DIALYSIS  200 mcg Intravenous Q Thu-HD  . feeding supplement (PRO-STAT SUGAR FREE 64)  30 mL Oral BID  . Gerhardt's butt cream   Topical QID  . metoprolol tartrate  12.5 mg Oral BID  . midodrine  10 mg Oral TID WC  . multivitamin  1 tablet Oral QHS  . pantoprazole  40 mg Oral BID  . polycarbophil  625 mg Oral TID WC  . pregabalin  25 mg Oral QHS  . saccharomyces  boulardii  500 mg Oral TID  . sertraline  50 mg Oral Daily  . sevelamer carbonate  1,600 mg Oral TID WC  . warfarin  2 mg Oral ONCE-1800  . Warfarin - Pharmacist Dosing Inpatient   Does not apply q1800    Dialysis Orders: Previously on PD -> transitioned recently to HD after episode peritonitis.  Non CKA pt  Assessment/Plan: 1.Debility:PT/OT per CIR.  2. ESRD:PD cath removed 11/16 at another facility. Continue on HD TTS schedulehere.  Next HD tomorrow.  K 3.5, use 3K bath.   3.HTN/volume:BP well controlled. on midodrine 10 mg TID.  Does not appear grossly volume overloaded. Continue to titrate down volume as tolerated.    4. Anemia:Hgb stable 8.3. Aranesp 236mcg q Thurs (his usual dose) and course of IV iron. Hx recent GIB - stable gastric ulcer, on PPI.  5. Secondary hyperparathyroidism:Continue VDRA/binder.  Calcium and phos in  goal.  6. Nutrition:Alb 2 continue pro-stat supplements.  Renal diet w/fluid restrictions.   7. Hx mechanical AoV, A-fib/flutter: Warfarin per pharmacy - hold heparin with HD.  Note on metoprolol   8. T2DM - per primary  9. Hx testicular cancer  10. Leukocytosis - plateaued- does have TDC and leg wounds - blood cultures NGTD 12/13  11. Frequent bowel movements - GI consulting   Calvin Mow, PA-C [facility] Pager: [phone] 04/18/2019,3:56 PM  LOS: 11 days   I have seen and examined this patient and agree with plan and assessment in the above note with renal recommendations/intervention highlighted.  No complaints except for discomfort at his sacrum related to diarrhea.  To start colestipol per primary svc. Calvin Rooks Shantoria Ellwood,MD 04/18/2019 6:26 PM

## 2019-04-18 NOTE — Consult Note (Addendum)
[facility] Consult  Referring Provider: Dorthea Cove Primary Care Physician:  Larene Beach, MD Primary Gastroenterologist: Althia Forts  Reason for Consultation: Frequent bowel movement, perianal soreness  HPI: Calvin [name] is a 63 y.o. male was admitted at [facility] health physical medicine and rehabilitation on 04/14/2019 with history of CAD status post repair with stent history of coarctation of aorta, [facility] AVR, atrial flutter/atrial fibrillation on Coumadin, obesity, gout, testicular cancer, diabetes with neuropathy, end-stage renal disease was on peritoneal dialysis, PD catheter removed due to sepsis with peritonitis, now on hemodialysis. He has been on prolonged antibiotics including vancomycin and meropenem until 04/04/2019. He had diarrhea since October which is worsened over the last several weeks requiring rectal tube placement and deep tissue injury on sacrum along with anemia requiring PRBC transfusion.  Rectal tube was removed on 04/30/2019 but he continued to have diarrhea with fecal incontinence and has been at [facility] for therapy.  He has history of smoking for 37 years, 1.75 pack year smoking history and denies use of alcohol.  Patient states that because of prolonged antibiotic use he developed loose watery stools several times a day including nocturnal diarrhea.  The antibiotics have now been stopped and he has been taking Lomotil 1 tablet 4 times a day which has caused bowel movements to be decreased in frequency, however he continues to have episodes of fecal incontinence causing perianal soreness and interfering with physical therapy. He is also on Florastor 500 mg 3 times a day. Patient states he had an endoscopy recently at [facility], follow-up of peptic ulcer disease and is continued on pantoprazole 40 mg twice a day. Colonoscopy from 2014 performed at [facility] was reported as pandiverticulosis with internal hemorrhoids.  Patient has not noted any blood in  stool or black stools. He denies abdominal pain. He was told he had gastroparesis and was recommended to have small frequent meals of low fiber low residue diet.  Was also given Reglan at one point for nausea and vomiting, she is now resolved. Patient has been using zinc oxide(40%) and gerhardt's butt cream as recommended by wound care, with minimal improvement in symptoms. Patient is also on glycerin suppository as needed for moderate constipation. Patient and his wife reports that 2 months ago he weighed 335 pounds and currently weighs 287 pounds. He denies difficulty swallowing or pain on swallowing.  He denies heartburn acid reflux.   Past Medical History:  Diagnosis Date  . Anemia   . Anemia of renal disease 10/04/2011  . Aortic aneurysm ([place])   . Atrial fibrillation ([place])   . Atrial flutter ([place])   . AVD (aortic valve disease)   . Bilateral lower extremity edema 07/03/2012  . CAD (coronary artery disease)   . Chronic anticoagulation 07/10/2012  . CKD (chronic kidney disease)    baseline creatinine is 1.5-2  . CKD (chronic kidney disease) stage 3, GFR 30-59 ml/min 07/02/2012  . Coarctation of aorta (previous repair and stent) 04/05/2011  . Colonic ulcer 2007   felt to be secondary to NSAID  . Complex coarctation of the aorta   . Gastric polyp 2007, 06/2012   benign.  . Gout   . Gout 07/03/2012  . Hepatitis C   . History of atrial flutter s/p DCCV 04/05/2011  . HTN (hypertension)   . Hyperlipidemia   . Hypertension 04/05/2011  . Long term (current) use of anticoagulants 12/12/2013  . Obesity   . PONV (postoperative nausea and vomiting)   . Renal insufficiency   .  S/P aortic valve replacement 04/05/2011  . 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 Testicular adenoma   . Thoracic aortic aneurysm ([place]) 04/05/2011    Past Surgical History:  Procedure Laterality Date  . AORTIC VALVE REPLACEMENT  1995   [facility]  . AORTIC VALVE REPLACEMENT AND MITRAL VALVE REPAIR   1974   Bicuspid aortic valve  . CARDIOVERSION  02/27/2012   Procedure: CARDIOVERSION;  Surgeon: Lelon Perla, MD;  Location: [facility] ENDOSCOPY;  Service: Cardiovascular;  Laterality: N/A;  . CARDIOVERSION N/A 05/29/2013   Procedure: CARDIOVERSION;  Surgeon: Lelon Perla, MD;  Location: [facility] ENDOSCOPY;  Service: Cardiovascular;  Laterality: N/A;  . CARDIOVERSION N/A 12/15/2013   Procedure: CARDIOVERSION;  Surgeon: Sueanne Margarita, MD;  Location: [facility] ENDOSCOPY;  Service: Cardiovascular;  Laterality: N/A;  . CARDIOVERSION N/A 08/25/2014   Procedure: CARDIOVERSION;  Surgeon: Lelon Perla, MD;  Location: [facility] ENDOSCOPY;  Service: Cardiovascular;  Laterality: N/A;  . CARDIOVERSION N/A 02/17/2015   Procedure: CARDIOVERSION;  Surgeon: Larey Dresser, MD;  Location: [place];  Service: Cardiovascular;  Laterality: N/A;  . Cylinder  . COARCTATION OF AORTA REPAIR  1970  . ESOPHAGOGASTRODUODENOSCOPY N/A 07/04/2012   Procedure: ESOPHAGOGASTRODUODENOSCOPY (EGD);  Surgeon: Inda Castle, MD;  Location: [place];  Service: Endoscopy;  Laterality: N/A;  . ORCHIECTOMY  1992   testicular cancer  . TONSILLECTOMY      Prior to Admission medications   Medication Sig Start Date End Date Taking? Authorizing Provider  acetaminophen (TYLENOL) 325 MG tablet Take 650 mg by mouth every 6 (six) hours as needed for fever (pain).   Yes [provider]  ALPRAZolam Duanne Moron) 0.5 MG tablet Take 0.5 mg by mouth 3 (three) times daily as needed for sleep.   Yes [provider]  Amino Acids-Protein Hydrolys (FEEDING SUPPLEMENT, PRO-STAT SUGAR FREE 64,) LIQD Take 30 mLs by mouth 3 (three) times daily with meals.   Yes [provider]  amoxicillin (AMOXIL) 500 MG capsule Take 500 mg by mouth daily as needed (1 hour prior to dental appts).  09/13/11  Yes [provider]  b complex-vitamin c-folic acid (NEPHRO-VITE) 0.8 MG TABS tablet Take 1 tablet by mouth daily.    Yes [provider]  calcitRIOL (ROCALTROL) 0.25 MCG capsule Take 0.25 mcg by mouth See admin instructions. Take one capsule (0.25 mcg) by mouth on Monday, Tuesday, Thursday,  Friday at 6pm   Yes [provider]  epoetin alfa-epbx (RETACRIT) 95638 UNIT/ML injection Inject 10,000 Units into the vein See admin instructions. Inject 10,000 units intravenously Tuesday, Thursday, Saturday after dialysis   Yes [provider]  glucagon (GLUCAGEN) 1 MG SOLR injection Inject 1 mg into the muscle once as needed for low blood sugar.   Yes [provider]  Guar Gum (NUTRISOURCE FIBER) PACK Take 1 packet by mouth 3 (three) times daily.   Yes [provider]  loperamide (IMODIUM) 2 MG capsule Take 2 mg by mouth 4 (four) times daily as needed for diarrhea or loose stools.   Yes [provider]  metoprolol tartrate (LOPRESSOR) 25 MG tablet Take 12.5 mg by mouth 2 (two) times daily.   Yes [provider]  midodrine (PROAMATINE) 5 MG tablet Take 5 mg by mouth 3 (three) times daily.   Yes [provider]  ondansetron (ZOFRAN) 4 MG tablet Take 4 mg by mouth every 6 (six) hours as needed for nausea or  vomiting.   Yes [provider]  oxyCODONE (OXY IR/ROXICODONE) 5 MG immediate release tablet Take 5 mg by mouth every 6 (six) hours as needed for severe pain (p).   Yes [provider]  pantoprazole (PROTONIX) 40 MG tablet Take 40 mg by mouth 2 (two) times daily.    Yes [provider]  pregabalin (LYRICA) 25 MG capsule Take 25 mg by mouth 2 (two) times daily.   Yes [provider]  Probiotic Product (DIGESTIVE ADVANTAGE) CAPS Take 1 capsule by mouth daily.   Yes [provider]  sertraline (ZOLOFT) 50 MG tablet Take 100 mg by mouth daily.   Yes [provider]  sevelamer carbonate (RENVELA) 800 MG tablet Take 800 mg by mouth 3 (three) times daily with meals.    Yes [provider]  warfarin  (COUMADIN) 5 MG tablet Take 5 mg by mouth daily at 6 PM.   Yes [provider]  zolpidem (AMBIEN) 5 MG tablet Take 5 mg by mouth at bedtime as needed for sleep.   Yes [provider]  aspirin EC 81 MG tablet Take 1 tablet (81 mg total) by mouth daily. Patient not taking: Reported on 05/01/2019 12/26/17   Lelon Perla, MD  metoprolol succinate (TOPROL-XL) 50 MG 24 hr tablet TAKE 1 TABLET (50 MG TOTAL) BY MOUTH 2 (TWO) TIMES DAILY. TAKE WITH OR IMMEDIATELY FOLLOWING A MEAL. Patient not taking: Reported on 04/17/2019 12/30/18   Lelon Perla, MD  warfarin (COUMADIN) 7.5 MG tablet Take 1 tablet (7.5 mg total) by mouth daily at 6 PM. Patient not taking: Reported on 04/06/2019 08/09/16   Lelon Perla, MD    Current Facility-Administered Medications  Medication Dose Route Frequency Provider Last Rate Last Admin  . acetaminophen (TYLENOL) tablet 325-650 mg  325-650 mg Oral Q4H PRN Bary Leriche, PA-C   650 mg at 04/18/19 0746  . ALPRAZolam Duanne Moron) tablet 0.5 mg  0.5 mg Oral TID Meredith Staggers, MD   0.5 mg at 04/18/19 6378  . calcitRIOL (ROCALTROL) capsule 0.25 mcg  0.25 mcg Oral Q T,Th,Sa-HD Bary Leriche, PA-C   0.25 mcg at 04/17/19 1220  . calcium carbonate (TUMS - dosed in mg elemental calcium) chewable tablet 200 mg of elemental calcium  1 tablet Oral TID PRN Bary Leriche, PA-C      . Chlorhexidine Gluconate Cloth 2 % PADS 6 each  6 each Topical BID Meredith Staggers, MD   6 each at 04/18/19 [phone]  . Darbepoetin Alfa (ARANESP) injection 200 mcg  200 mcg Intravenous Q Thu-HD Loren Racer, PA-C   200 mcg at 04/17/19 1414  . diphenhydrAMINE (BENADRYL) 12.5 MG/5ML elixir 12.5-25 mg  12.5-25 mg Oral Q6H PRN Love, Pamela S, PA-C      . diphenoxylate-atropine (LOMOTIL) 2.5-0.025 MG per tablet 1 tablet  1 tablet Oral QID Meredith Staggers, MD   1 tablet at 04/18/19 1240  . feeding supplement (PRO-STAT SUGAR FREE 64) liquid 30 mL  30 mL Oral BID Bary Leriche, PA-C   30  mL at 04/18/19 0741  . ferric gluconate (NULECIT) 125 mg in sodium chloride 0.9 % 100 mL IVPB  125 mg Intravenous Q T,Th,Sa-HD Loren Racer, PA-C 110 mL/hr at 04/17/19 1611 125 mg at 04/17/19 1611  . Gerhardt's butt cream   Topical QID Meredith Staggers, MD   Given at 04/18/19 1120  . Glycerin (Adult) 2.1 g suppository 1 suppository  1 suppository Rectal  Daily PRN Bary Leriche, PA-C      . guaiFENesin (ROBITUSSIN) 100 MG/5ML solution 100 mg  5 mL Oral Q4H PRN Bary Leriche, PA-C   100 mg at 04/10/19 2235  . metoprolol tartrate (LOPRESSOR) tablet 12.5 mg  12.5 mg Oral BID Bary Leriche, PA-C   12.5 mg at 04/18/19 0102  . midodrine (PROAMATINE) tablet 10 mg  10 mg Oral TID WC LoveIvan Anchors, PA-C   10 mg at 04/18/19 1241  . multivitamin (RENA-VIT) tablet 1 tablet  1 tablet Oral QHS Bary Leriche, PA-C   1 tablet at 04/17/19 2035  . oxyCODONE (Oxy IR/ROXICODONE) immediate release tablet 5 mg  5 mg Oral Q6H PRN Bary Leriche, PA-C   5 mg at 04/17/19 1826  . pantoprazole (PROTONIX) EC tablet 40 mg  40 mg Oral BID Raulkar, Clide Deutscher, MD   40 mg at 04/18/19 0742  . polycarbophil (FIBERCON) tablet 625 mg  625 mg Oral TID WC LoveIvan Anchors, PA-C   625 mg at 04/18/19 1240  . pregabalin (LYRICA) capsule 25 mg  25 mg Oral QHS Meredith Staggers, MD   25 mg at 04/17/19 2037  . prochlorperazine (COMPAZINE) tablet 5-10 mg  5-10 mg Oral Q6H PRN Love, Pamela S, PA-C       Or  . prochlorperazine (COMPAZINE) injection 5-10 mg  5-10 mg Intramuscular Q6H PRN Love, Pamela S, PA-C       Or  . prochlorperazine (COMPAZINE) suppository 12.5 mg  12.5 mg Rectal Q6H PRN Love, Pamela S, PA-C      . saccharomyces boulardii (FLORASTOR) capsule 500 mg  500 mg Oral TID Courtney Heys, MD   500 mg at 04/18/19 0742  . sertraline (ZOLOFT) tablet 50 mg  50 mg Oral Daily Bary Leriche, PA-C   50 mg at 04/18/19 0741  . sevelamer carbonate (RENVELA) tablet 1,600 mg  1,600 mg Oral TID WC Loren Racer, PA-C   1,600 mg at  04/18/19 1240  . simethicone (MYLICON) chewable tablet 80 mg  80 mg Oral QID PRN Love, Pamela S, PA-C      . warfarin (COUMADIN) tablet 2 mg  2 mg Oral ONCE-1800 Pierce, Dwayne A, RPH      . Warfarin - Pharmacist Dosing Inpatient   Does not apply q1800 Rolla Flatten, [facility]   Stopped at 04/16/19 1800  . zolpidem (AMBIEN) tablet 5 mg  5 mg Oral QHS PRN Bary Leriche, PA-C   5 mg at 04/09/19 2200    Allergies as of 04/02/2019 - Review Complete 04/03/2019  Allergen Reaction Noted  . Ace inhibitors Swelling, Anxiety, and Other (See Comments) 04/04/2011  . Methoxy polyethylene glycol-epoetin beta Other (See Comments) 12/19/2016  . Ramipril Other (See Comments) 03/15/2016  . Tape Rash 05/21/2017    Family History  Problem Relation Age of Onset  . Heart disease Other        No family history  . Heart attack Neg Hx     Social History   Socioeconomic History  . Marital status: Married    Spouse name: Not on file  . Number of children: 4  . Years of education: Not on file  . Highest education level: Not on file  Occupational History  . Not on file  Tobacco Use  . Smoking status: Former Smoker    Packs/day: 0.25    Years: 7.00    Pack years: 1.75    Types: Cigarettes  Start date: 05/28/1975    Quit date: 05/27/1981    Years since quitting: 37.9  . Smokeless tobacco: Never Used  . Tobacco comment: quit 25 years ago  Substance and Sexual Activity  . Alcohol use: Yes    Alcohol/week: 0.0 standard drinks    Comment: wine, occasionally  . Drug use: No  . Sexual activity: Not on file  Other Topics Concern  . Not on file  Social History Narrative   Pt lives in [place] with wife.  3 children ages 67,20,22 (all 3 are adopted)   Sales grinding wheels.   Social Determinants of Health   Financial Resource Strain:   . Difficulty of Paying Living Expenses: Not on file  Food Insecurity:   . Worried About Charity fundraiser in the Last Year: Not on file  . Ran Out of Food in  the Last Year: Not on file  Transportation Needs:   . Lack of Transportation (Medical): Not on file  . Lack of Transportation (Non-Medical): Not on file  Physical Activity:   . Days of Exercise per Week: Not on file  . Minutes of Exercise per Session: Not on file  Stress:   . Feeling of Stress : Not on file  Social Connections:   . Frequency of Communication with Friends and Family: Not on file  . Frequency of Social Gatherings with Friends and Family: Not on file  . Attends Religious Services: Not on file  . Active Member of Clubs or Organizations: Not on file  . Attends Archivist Meetings: Not on file  . Marital Status: Not on file  Intimate Partner Violence:   . Fear of Current or Ex-Partner: Not on file  . Emotionally Abused: Not on file  . Physically Abused: Not on file  . Sexually Abused: Not on file    Review of Systems: Positive for: GI: Described in detail in HPI.    Gen: anorexia, fatigue, weakness, malaise, involuntary weight loss, Denies any fever, chills, rigors, night sweats, and sleep disorder CV: Denies chest pain, angina, palpitations, syncope, orthopnea, PND, peripheral edema, and claudication. Resp: Denies dyspnea, cough, sputum, wheezing, coughing up blood. GU : Denies urinary burning, blood in urine, urinary frequency, urinary hesitancy, nocturnal urination, and urinary incontinence. MS: Denies joint pain or swelling.  Denies muscle weakness, cramps, atrophy.  Derm: Perianal excoriation Psych: Denies depression, anxiety, memory loss, suicidal ideation, hallucinations,  and confusion. Heme: Denies bruising, bleeding, and enlarged lymph nodes. Neuro:  Denies any headaches, dizziness, paresthesias. Endo:  DM, Denies any problems with , thyroid, adrenal function.  Physical Exam: Vital signs in last 24 hours: Temp:  [97.9 F (36.6 C)-98.5 F (36.9 C)] 98.5 F (36.9 C) (12/18 1348) Pulse Rate:  [62-113] 84 (12/18 1348) Resp:  [18-20] 19 (12/18  1348) BP: (92-127)/(42-101) 105/42 (12/18 1348) SpO2:  [96 %-100 %] 99 % (12/18 1343) Weight:  [130.4 kg-131.6 kg] 130.4 kg (12/18 0410) Last BM Date: 04/18/19  General:   Obese, pleasant, cooperative, lying on bed with neck support Head:  Normocephalic and atraumatic. Eyes:  Sclera clear, no icterus.   Prominent pallor Ears:  Normal auditory acuity. Nose:  No deformity, discharge,  or lesions. Mouth:  No deformity or lesions.  Oropharynx pink & moist. Neck:  Supple; no masses or thyromegaly. Lungs:  Clear throughout to auscultation.   No wheezes, crackles, or rhonchi. No acute distress. Heart:  Regular rate and rhythm;   prominent click, no rubs,  or gallops. Extremities:  Without clubbing or edema. Neurologic:  Alert and  oriented x4;  grossly normal neurologically. Skin:  Intact without significant lesions or rashes. Psych:  Alert and cooperative. Normal mood and affect. Abdomen:  Soft, nontender and nondistended. No masses, hepatosplenomegaly or hernias noted. Normal bowel sounds, without guarding, and without rebound.    Perianal area: Wet, erythematous, skin tears, small amount of brown mucoid stool noted      Lab Results: Recent Labs    04/17/19 1313  WBC 13.9*  HGB 8.3*  HCT 27.8*  PLT 629*   BMET Recent Labs    04/17/19 1313  NA 138  K 3.5  CL 98  CO2 24  GLUCOSE 116*  BUN 63*  CREATININE 6.97*  CALCIUM 9.0   LFT Recent Labs    04/17/19 1313  ALBUMIN 2.0*   PT/INR Recent Labs    04/17/19 1300 04/18/19 0646  LABPROT 49.6* 32.2*  INR 5.4* 3.1*    Studies/Results: No results found.  Impression: Diarrhea and fecal incontinence with perianal wound Diarrhea is likely multifactorial-recent antibiotic use(C. difficile was negative on 04/26/2019), autonomic neuropathy, possible IBS  Multiple comorbidities-end-stage renal disease on hemodialysis, obesity, A. fib/flutter on Coumadin, metallic aortic valve replacement, normocytic anemia  Plan: We  will start patient on colestipol 1 gm 2 pills a day, Lomotil to be used only if needed. We can increase dosage of colestipol up to 2 pills 3 times a day, this needs to be adjusted according to his bowel movement. Discussed with the patient and his wife at bedside that colestipol may cause constipation, may need to be decreased in dose or discontinued if constipation is noted. Hopefully with decrease in frequency of bowel movements and continued use of zinc oxide ointment, perianal soreness and erythema will improve. Recommend low fiber, low residue diet. Ok to continue Florastor 500 mg TID for now.   LOS: 11 days   Ronnette Juniper, MD  04/18/2019, 2:48 PM

## 2019-04-18 NOTE — Progress Notes (Signed)
Occupational Therapy Session Note  Patient Details  Name: Calvin [name] MRN: [id number] Date of Birth: [birth year]  Today's Date: 04/18/2019 OT Individual Time: 1015-1053      Make Up Session OT Individual Time Calculation (min): 38 min    Short Term Goals: Week 2:  OT Short Term Goal 1 (Week 2): Pt will remain OOB in w/c for 45 min to increase functional activity tolerance OT Short Term Goal 2 (Week 2): Pt will complete slideboard transfer to [facility] with max +1 OT Short Term Goal 3 (Week 2): Pt will consistently get OOB 1x a day  Skilled Therapeutic Interventions/Progress Updates:    Pt in bed sleeping to start session.  He was agreeable to participation in [place] therex exercises but would not transition to the EOB or OOB at this time as he stated he would work on this at 11:00 with scheduled PT.  He was able to complete 2 sets of 10 repetitions for bilateral shoulder horizontal abduction from bed level with min assist using the level 2 orange resistance band. He then completed shoulder flexion with the band on the RUE with min facilitation for technique with AAROM on the left shoulder for 9 repetitions secondary to likely rotator cuff injury.  He was able to complete 1 set of 15 reps for bilateral elbow extension.  Finished session with completion of one set of external rotation with the elbow flexed to 90 degrees on theLUE with light resistance from the band for 10 repetitions.  Pt did not want to do any further exercises secondary to having PT in approximately 10 mins and needing to have nursing complete some peri care.  He declined OT assisting with this.  Pt left in the bed with the call button and phone in reach.    Therapy Documentation Precautions:  Precautions Precautions: Fall Precaution Comments: monitor HR, L heel wound Restrictions Weight Bearing Restrictions: No  Pain: Pain Assessment Pain Scale: Faces Pain Score: 0-No pain ADL: See Care Tool Section for some details of  mobility and selfcare  Therapy/Group: Individual Therapy  Derenda Giddings OTR/L 04/18/2019, 12:34 PM

## 2019-04-18 NOTE — Progress Notes (Signed)
Occupational Therapy Session Note  Patient Details  Name: Calvin [name] MRN: [id number] Date of Birth: October 06, 1955  Today's Date: 04/18/2019 OT Individual Time: 0800-0900 OT Individual Time Calculation (min): 60 min    Short Term Goals: Week 1:  OT Short Term Goal 1 (Week 1): Pt will don UB clothing EOB with mod A OT Short Term Goal 1 - Progress (Week 1): Met OT Short Term Goal 2 (Week 1): Pt will transfer to EOB with mod A OT Short Term Goal 2 - Progress (Week 1): Not met OT Short Term Goal 3 (Week 1): Pt will reduce anxiety during OT, evidenced by maintaining HR under 130 bpm during bed mobility OT Short Term Goal 3 - Progress (Week 1): Met  Skilled Therapeutic Interventions/Progress Updates:    1:1. Pt received in bed refusing to sit EOB/get OOB d/t buttock pain. Spent significant amount of time educating pt on pressure relief, turning schedule, and upright tolerance. OT selects TIS w/c 22" wide to trial with PT as pt refuses to get OOB this session. Communicated with RN about turning schedule for side lying to continue for pressure relief. Pt completes rolling B in bed with MAX A while OT cleanses bottom, applies new barrier cream, and don new brief. At end of session with pt agrreeable to trial TIS iwht PT this date. Exited session with pt seated in bed,e xit alarm on and call light tinr each  Therapy Documentation Precautions:  Precautions Precautions: Fall Precaution Comments: monitor HR, L heel wound Restrictions Weight Bearing Restrictions: No General:   Vital Signs:  Pain:   ADL: ADL Eating: Supervision/safety Where Assessed-Eating: Bed level Grooming: Supervision/safety Where Assessed-Grooming: Bed level Upper Body Bathing: Moderate assistance Where Assessed-Upper Body Bathing: Bed level Lower Body Bathing: Dependent Where Assessed-Lower Body Bathing: Bed level Upper Body Dressing: Maximal assistance Where Assessed-Upper Body Dressing: Edge of bed Lower Body  Dressing: Dependent Where Assessed-Lower Body Dressing: Bed level Toileting: Dependent Where Assessed-Toileting: Bed level Toilet Transfer: Unable to assess Vision   Perception    Praxis   Exercises:   Other Treatments:     Therapy/Group: Individual Therapy  Tonny Branch 04/18/2019, 8:59 AM

## 2019-04-18 NOTE — Progress Notes (Signed)
[facility] PHYSICAL MEDICINE & REHABILITATION PROGRESS NOTE   Subjective/Complaints: Still having pain at the center of his buttocks from raw skin. Hurts to sit on area. Frustrated that he can't sit for longer. Stool remains mushy and frequent  ROS: Patient denies fever, rash, sore throat, blurred vision, nausea, vomiting,  cough, shortness of breath or chest pain, joint or back pain, headache, or mood change.     Objective:   No results found. Recent Labs    04/15/19 1407 04/17/19 1313  WBC 15.9* 13.9*  HGB 8.4* 8.3*  HCT 27.2* 27.8*  PLT 606* 629*   Recent Labs    04/15/19 1407 04/17/19 1313  NA 136 138  K 3.7 3.5  CL 95* 98  CO2 24 24  GLUCOSE 118* 116*  BUN 74* 63*  CREATININE 8.56* 6.97*  CALCIUM 8.7* 9.0    Intake/Output Summary (Last 24 hours) at 04/18/2019 0942 Last data filed at 04/17/2019 1900 Gross per 24 hour  Intake 120 ml  Output 3000 ml  Net -2880 ml     Physical Exam: Vital Signs Blood pressure (!) 120/54, pulse (!) 108, temperature 98 F (36.7 C), temperature source Oral, resp. rate 18, height 6\' 2"  (1.88 m), weight 130.4 kg, SpO2 96 %. Constitutional: No distress . Vital signs reviewed. HEENT: EOMI, oral membranes moist Neck: supple Cardiovascular: RRR without murmur. No JVD    Respiratory: CTA Bilaterally without wheezes or rales. Normal effort    GI: BS +, non-tender, non-distended   Musculoskeletal:        General: Edema present LE.tr to 1+     Comments: left shoulder with ROM Neurological: He is alert and oriented to person, place, and time.  MMT 2-3/5 LE's, 3/5 UE.  Skin: Skin is warm and dry.  Toe wounds, heel pressure sores, left > right. Unchanged. Buttocks area is improving except for region directly at anus where it's in contact with stool. Tender to touch, raw, macerated with some ulceration/bleeding Psych: pleasant, anxious   Assessment/Plan: 1. Functional deficits secondary to debility which require 3+ hours per day of  interdisciplinary therapy in a comprehensive inpatient rehab setting.  Physiatrist is providing close team supervision and 24 hour management of active medical problems listed below.  Physiatrist and rehab team continue to assess barriers to discharge/monitor patient progress toward functional and medical goals  Care Tool:  Bathing  Bathing activity did not occur: Refused Body parts bathed by patient: Front perineal area, Buttocks         Bathing assist Assist Level: 2 Helpers     Upper Body Dressing/Undressing Upper body dressing   What is the patient wearing?: Pull over shirt    Upper body assist Assist Level: Supervision/Verbal cueing    Lower Body Dressing/Undressing Lower body dressing      What is the patient wearing?: Incontinence brief     Lower body assist Assist for lower body dressing: 2 Helpers     Toileting Toileting    Toileting assist Assist for toileting: 2 Helpers     Transfers Chair/bed transfer  Transfers assist  Chair/bed transfer activity did not occur: Safety/medical concerns  Chair/bed transfer assist level: 2 Helpers     Locomotion Ambulation   Ambulation assist   Ambulation activity did not occur: Safety/medical concerns          Walk 10 feet activity   Assist  Walk 10 feet activity did not occur: Safety/medical concerns        Walk 50 feet activity  Assist Walk 50 feet with 2 turns activity did not occur: Safety/medical concerns         Walk 150 feet activity   Assist Walk 150 feet activity did not occur: Safety/medical concerns         Walk 10 feet on uneven surface  activity   Assist Walk 10 feet on uneven surfaces activity did not occur: Safety/medical concerns         Wheelchair     Assist Will patient use wheelchair at discharge?: Yes Type of Wheelchair: Manual Wheelchair activity did not occur: Safety/medical concerns  Wheelchair assist level: Minimal Assistance - Patient >  75% Max wheelchair distance: 5'    Wheelchair 50 feet with 2 turns activity    Assist    Wheelchair 50 feet with 2 turns activity did not occur: Safety/medical concerns       Wheelchair 150 feet activity     Assist  Wheelchair 150 feet activity did not occur: Safety/medical concerns       Blood pressure (!) 120/54, pulse (!) 108, temperature 98 F (36.7 C), temperature source Oral, resp. rate 18, height 6\' 2"  (1.88 m), weight 130.4 kg, SpO2 96 %.  Medical Problem List and Plan: 1.  Impaired mobility and ADLs secondary to deibility             -patient may shower            -activity remains limited by pain, anxiety, stooling  -therapy remains 15/7.   -therapy trying tilt in space w/c to help with pain from sitting on buttocks 2.  St Jude's Aortic Valve/Antithrombotics: -DVT/anticoagulation:  Pharmaceutical: Coumadin  12/18  INR down to 3.1 today after 1mg  vitamin K yesterday. Resume coumadin per pharm protocol. Appreciate theirhelp             -antiplatelet therapy: N/A 3. Pain Management: Oxycodone prn  -resumed lyrica at hs only 25mg  4. Mood: LCSW to follow for evaluation and support.              -antipsychotic agents: N/A  -anxiety disorder/ recent MDD   - increased xanax back to 0.5mg ,    -continue to provide positive reinforcement. Trying to empower him to take some initiative and accountability for his care and progress 5. Neuropsych: This patient is capable of making decisions on his own behalf. 6. Skin/Wound Care: Air mattress overlay for MASD due to ongoing diarrhea.    -some improvement especially at peripheries of area   - gerhardt's cream to buttocks  -rx loose stools  -prevalon boots bilateral heels    7. Fluids/Electrolytes/Nutrition: Strict I/O. Renal diet with 1200 cc FR.  8. ESRD: Now on HD with attempts to get off 2 L. Schedule hemodialysis at the end of the day to help with therapy tolerance. TTS schedule   12/10: diet liberalized to help  with intake  -volume/edema mgt per nephrology   [facility] Weights   04/17/19 1255 04/17/19 1716 04/18/19 0410  Weight: 134.7 kg 131.6 kg 130.4 kg     9. Hypotension: Monitor BP tid--continue midodrine. 10 History of A fib/A flutter: Monitor HR t--on coumadin. Continue Lopressor 12.5mg  BID 11. Recent GIB/Anemia of chronic disease: Continue to monitor H/H with serial checks. On Retacrit 10,000 units with HD.     hgb/tranfusion per nephrology 1u --8.3 on 12/17 12. Loose stool: C diff negative  -continue fiber  -increased probiotic to 500mg   TID  -diet--reinforced that consistent PO intake is important also  -scheduled lomotil  -  12/18 will stop megace given it's lack of benefit and potential for loose stool (although was having loose stool long before this was started)   -ask GI for recs  13. Cough with phlegm: Guaifenesin prn 14. Shortness of breath at rest: encourage IS, anxiety mgt. Continue to follow 15. Leukocytosis:  -wbc's  13.9 12/17  -bc negative x 2 but not final  -continue to follow wbc's serially  -reassuring patient   LOS: 11 days A FACE TO FACE EVALUATION WAS PERFORMED  Meredith Staggers 04/18/2019, 9:42 AM

## 2019-04-18 NOTE — Progress Notes (Signed)
ANTICOAGULATION CONSULT NOTE - Follow Up Consult  Pharmacy Consult for Coumadin Indication: St Jude AVR 1990s + Afib  Patient Measurements: Height: 6\' 2"  (188 cm) Weight: 287 lb 7.7 oz (130.4 kg) IBW/kg (Calculated) : 82.2  Vital Signs: Temp: 98 F (36.7 C) (12/18 0410) Temp Source: Oral (12/18 0410) BP: 120/54 (12/18 0410) Pulse Rate: 108 (12/18 0410)  Labs: Recent Labs    04/15/19 0603 04/15/19 1407 04/16/19 0504 04/17/19 1300 04/17/19 1313  HGB  --  8.4*  --   --  8.3*  HCT  --  27.2*  --   --  27.8*  PLT  --  606*  --   --  629*  LABPROT 38.9*  --  44.9* 49.6*  --   INR 4.0*  --  4.8* 5.4*  --   CREATININE  --  8.56*  --   --  6.97*    Estimated Creatinine Clearance: 15.8 mL/min (A) (by C-G formula based on SCr of 6.97 mg/dL (H)).  Assessment: Warfarin PTA for hx St Jude AVR (1990s) + Afib. Range should be 2.5-3.5 given both of these; however, per discussion with patient, he was told to aim for 2.5 mid 2019 by MD due to hx GIB. Per chart review, appears that GIB events are related to procedures (polypectomy) and GI ulcers.  PTA warfarin dose: 5 mg daily  INR down to 3.1 today s/p Vit K dose. High INR likely due to the 5 doses of 6 and 7.5mg  received 12/8-12/12, megestrol drug interaction (started 12/13) and patient not eating vegetables lately. Will give smaller dose to prevent subtherapeutic INR.    Goal of Therapy:  INR 2.5-3 Monitor platelets by anticoagulation protocol: Yes   Plan:  - Warfarin 2mg  today - Patient to increase vegetable intake  - Daily PT/INR, Q Mon CBC  - Monitor for signs/symptoms of bleeding  Nedra Mcinnis A. Levada Dy, PharmD, BCPS, FNKF Clinical Pharmacist [place] Please utilize Amion for appropriate phone number to reach the unit pharmacist ([place])

## 2019-04-18 NOTE — Progress Notes (Signed)
Physical Therapy Session Note  Patient Details  Name: Calvin [name] MRN: [id number] Date of Birth: 1955-12-07  Today's Date: 04/18/2019 PT Individual Time: 1110-1210 PT Individual Time Calculation (min): 60 min   Short Term Goals: Week 1:  PT Short Term Goal 1 (Week 1): Pt will initiate OOB mobility PT Short Term Goal 1 - Progress (Week 1): Met PT Short Term Goal 2 (Week 1): Pt will perform bed mobility w/ mod assist +1 PT Short Term Goal 2 - Progress (Week 1): Not met PT Short Term Goal 3 (Week 1): Pt will initiate w/c propulsion PT Short Term Goal 3 - Progress (Week 1): Met Week 2:  PT Short Term Goal 1 (Week 2): Pt will perform bed mobiity with mod assist PT Short Term Goal 2 (Week 2): Pt will be able to propel w/c x 50' with supervision PT Short Term Goal 3 (Week 2): Pt will be able to perform bed <> chair transfers with max +1 assist Week 3:     Skilled Therapeutic Interventions/Progress Updates:     Therapy Documentation Precautions:  Precautions Precautions: Fall Precaution Comments: monitor HR, L heel wound Restrictions Weight Bearing Restrictions: No  PAIN bottom w/sitting.  Varies 4-6 w/repositioining to improve comfort.     Pt initially receiving care for wounds w/PA and nursing, session delayed x 10 min.  Pt then agreeable to treatment including bed therex and attempting OOB to wc to try increasing sitting tolerance in TIS wc.  Pt performed bilat LE hip abd/add x 10, hip and knee flexion x 10, bridging x 10 w/theapist stabilizing feet.  Supine to side w/mod assist and max verbal cuing for sequencing as well as additional time.  Sit to sit w/cues/additional time/mod to max assist.   In sitting requires min assist for balance while therapist donns shoes for transfer.  Bed to wc using beazy board w/max assist of 2.  Pt initially requesting to return to bed but repositioned, chair adjustments made to headrest, armrests, legrests, and increased tilt.  Pt made comfortable by  this and agreed to leaving room in wc. Pt educated re TIS adjustability for alterations in pressure distribution, use of Jay2 cushion to improve pressure distribution.  Pt taken to PT gyms and educated re equipment available for improving his deficits/functional training/used for rehab  Pt taken to UBE and performed 4 min UBE at slow but consistent cadence.  Pt returned to room and understands plan for OT to transfer pt backt to bed at 1pm.  Pt requested therapist check on him between end of session and 1pm in case he wanted to get back to bed.  Reinforced that he would have to call nursing for assistance but that OT would be there at 1pm.  Pt voiced understanding and stated he was comforable at this time.  Pt left OOB in wc w/chair alarm set and needs in reach.     Therapy/Group: Individual Therapy  Callie Fielding, [place] 04/18/2019, 12:32 PM

## 2019-04-19 ENCOUNTER — Inpatient Hospital Stay (HOSPITAL_COMMUNITY): Payer: Medicare Other

## 2019-04-19 ENCOUNTER — Inpatient Hospital Stay (HOSPITAL_COMMUNITY): Payer: Medicare Other | Admitting: Physical Therapy

## 2019-04-19 DIAGNOSIS — F329 Major depressive disorder, single episode, unspecified: Secondary | ICD-10-CM

## 2019-04-19 DIAGNOSIS — R791 Abnormal coagulation profile: Secondary | ICD-10-CM

## 2019-04-19 DIAGNOSIS — D638 Anemia in other chronic diseases classified elsewhere: Secondary | ICD-10-CM

## 2019-04-19 DIAGNOSIS — N186 End stage renal disease: Secondary | ICD-10-CM

## 2019-04-19 DIAGNOSIS — K6289 Other specified diseases of anus and rectum: Secondary | ICD-10-CM

## 2019-04-19 DIAGNOSIS — D72829 Elevated white blood cell count, unspecified: Secondary | ICD-10-CM

## 2019-04-19 LAB — RENAL FUNCTION PANEL
Albumin: 2.3 g/dL — ABNORMAL LOW (ref 3.5–5.0)
Anion gap: 16 — ABNORMAL HIGH (ref 5–15)
BUN: 62 mg/dL — ABNORMAL HIGH (ref 8–23)
CO2: 23 mmol/L (ref 22–32)
Calcium: 9.5 mg/dL (ref 8.9–10.3)
Chloride: 96 mmol/L — ABNORMAL LOW (ref 98–111)
Creatinine, Ser: 6.7 mg/dL — ABNORMAL HIGH (ref 0.61–1.24)
GFR calc Af Amer: 9 mL/min — ABNORMAL LOW (ref >60)
GFR calc non Af Amer: 8 mL/min — ABNORMAL LOW (ref >60)
Glucose, Bld: 106 mg/dL — ABNORMAL HIGH (ref 70–99)
Phosphorus: 3.7 mg/dL (ref 2.5–4.6)
Potassium: 4 mmol/L (ref 3.5–5.1)
Sodium: 135 mmol/L (ref 135–145)

## 2019-04-19 LAB — CBC
HCT: 28.1 % — ABNORMAL LOW (ref 39.0–52.0)
Hemoglobin: 8.6 g/dL — ABNORMAL LOW (ref 13.0–17.0)
MCH: 28.3 pg (ref 26.0–34.0)
MCHC: 30.6 g/dL (ref 30.0–36.0)
MCV: 92.4 fL (ref 80.0–100.0)
Platelets: 681 10*3/uL — ABNORMAL HIGH (ref 150–400)
RBC: 3.04 MIL/uL — ABNORMAL LOW (ref 4.22–5.81)
RDW: 17 % — ABNORMAL HIGH (ref 11.5–15.5)
WBC: 13.4 10*3/uL — ABNORMAL HIGH (ref 4.0–10.5)
nRBC: 0 % (ref 0.0–0.2)

## 2019-04-19 LAB — PROTIME-INR
INR: 2.3 — ABNORMAL HIGH (ref 0.8–1.2)
Prothrombin Time: 25.2 seconds — ABNORMAL HIGH (ref 11.4–15.2)

## 2019-04-19 MED ORDER — COLESTIPOL HCL 1 G PO TABS
2.0000 g | ORAL_TABLET | Freq: Two times a day (BID) | ORAL | Status: DC
  Administered 2019-04-19 – 2019-04-21 (×4): 2 g via ORAL
  Filled 2019-04-19 (×5): qty 2

## 2019-04-19 MED ORDER — HEPARIN SODIUM (PORCINE) 1000 UNIT/ML DIALYSIS
2700.0000 [IU] | INTRAMUSCULAR | Status: DC | PRN
  Filled 2019-04-19: qty 3

## 2019-04-19 MED ORDER — PENTAFLUOROPROP-TETRAFLUOROETH EX AERO: 1.0000 "application " | INHALATION_SPRAY | CUTANEOUS | Status: DC | PRN

## 2019-04-19 MED ORDER — HEPARIN SODIUM (PORCINE) 1000 UNIT/ML IJ SOLN
INTRAMUSCULAR | Status: AC
  Filled 2019-04-19: qty 7

## 2019-04-19 MED ORDER — LIDOCAINE-PRILOCAINE 2.5-2.5 % EX CREA: 1.0000 "application " | TOPICAL_CREAM | CUTANEOUS | Status: DC | PRN

## 2019-04-19 MED ORDER — HEPARIN SODIUM (PORCINE) 1000 UNIT/ML DIALYSIS
1000.0000 [IU] | INTRAMUSCULAR | Status: DC | PRN
  Filled 2019-04-19: qty 1

## 2019-04-19 MED ORDER — SODIUM CHLORIDE 0.9 % IV SOLN: 100.0000 mL | INTRAVENOUS | Status: DC | PRN

## 2019-04-19 MED ORDER — PREGABALIN 25 MG PO CAPS
25.0000 mg | ORAL_CAPSULE | ORAL | Status: DC
  Administered 2019-04-19: 25 mg via ORAL
  Filled 2019-04-19 (×2): qty 1

## 2019-04-19 MED ORDER — SERTRALINE HCL 100 MG PO TABS
100.0000 mg | ORAL_TABLET | Freq: Every day | ORAL | Status: DC
  Administered 2019-04-20 – 2019-05-09 (×20): 100 mg via ORAL
  Filled 2019-04-19 (×20): qty 1

## 2019-04-19 MED ORDER — WARFARIN SODIUM 5 MG PO TABS
5.0000 mg | ORAL_TABLET | Freq: Once | ORAL | Status: AC
  Administered 2019-04-19: 19:00:00 5 mg via ORAL
  Filled 2019-04-19: qty 1

## 2019-04-19 MED ORDER — ALTEPLASE 2 MG IJ SOLR: 2.0000 mg | Freq: Once | INTRAMUSCULAR | Status: DC | PRN

## 2019-04-19 MED ORDER — LIDOCAINE HCL (PF) 1 % IJ SOLN
5.0000 mL | INTRAMUSCULAR | Status: DC | PRN
  Filled 2019-04-19: qty 5

## 2019-04-19 NOTE — Progress Notes (Signed)
ANTICOAGULATION CONSULT NOTE - Follow Up Consult  Pharmacy Consult for Coumadin Indication: St Jude AVR 1990s + Afib  Patient Measurements: Height: 6\' 2"  (188 cm) Weight: 290 lb 5.5 oz (131.7 kg) IBW/kg (Calculated) : 82.2  Vital Signs: Temp: 97.9 F (36.6 C) (12/19 0351) Temp Source: Oral (12/19 0351) BP: 100/58 (12/19 0900) Pulse Rate: 100 (12/19 0900)  Labs: Recent Labs    04/15/19 0603 04/15/19 1407 04/16/19 0504 04/17/19 1300 04/17/19 1313  HGB  --  8.4*  --   --  8.3*  HCT  --  27.2*  --   --  27.8*  PLT  --  606*  --   --  629*  LABPROT 38.9*  --  44.9* 49.6*  --   INR 4.0*  --  4.8* 5.4*  --   CREATININE  --  8.56*  --   --  6.97*    Estimated Creatinine Clearance: 15.9 mL/min (A) (by C-G formula based on SCr of 6.97 mg/dL (H)).  Assessment: Warfarin PTA for hx St Jude AVR (1990s) + Afib (CHA2DS2-VASc = 1). Range should be 2.5-3.5 given both of these; however, per discussion with patient, he was told to aim for 2.5 mid 2019 by MD due to hx GIB. Per chart review, appears that GIB events are related to procedures (polypectomy) and GI ulcers.  PTA warfarin dose: 5 mg daily  INR today now sub therapeutic at 2.3. no CBC since 12/17, but note that H&H and has been stable. 1 mg vitamin K was given on 12/17 due to supratherapeutic INR of 5.4.    Goal of Therapy:  INR 2.5-3 Monitor platelets by anticoagulation protocol: Yes   Plan:  - Warfarin 5 mg today - Daily PT/INR, Q Mon CBC  - Monitor for signs/symptoms of bleeding   Thank you,   Eddie Candle, PharmD PGY-1 Pharmacy Resident   Please check amion for clinical pharmacist contact number

## 2019-04-19 NOTE — Progress Notes (Addendum)
[facility] Progress Note   Subjective:   Patient seen and examined at bedside in HD. Feeling ok.  Diarrhea improving with new med.  Denies SOB, CP, n/v, abdominal pain and edema.    Objective Vitals:   04/18/19 1348 04/18/19 2025 04/19/19 0351 04/19/19 0900  BP: (!) 105/42 (!) 100/53 (!) 110/58 (!) 100/58  Pulse: 84 93 83 100  Resp: 19 20 18    Temp: 98.5 F (36.9 C) 97.8 F (36.6 C) 97.9 F (36.6 C)   TempSrc:   Oral   SpO2:  99% 98% 94%  Weight:   131.7 kg   Height:       Physical Exam General:NAD, chronically ill appearing male, laying in bed Heart:RRR, +valve click Lungs:CTAB, breath sounds decreased on R Abdomen:soft, NTND Extremities:no LE edema, trace edema in hips Dialysis Access: R IJ [facility] accessed  Filed Weights   04/17/19 1716 04/18/19 0410 04/19/19 0351  Weight: 131.6 kg 130.4 kg 131.7 kg    Intake/Output Summary (Last 24 hours) at 04/19/2019 1340 Last data filed at 04/19/2019 0830 Gross per 24 hour  Intake 120 ml  Output -  Net 120 ml    Additional Objective Labs: Basic Metabolic Panel: Recent Labs  Lab 04/15/19 1407 04/17/19 1313  NA 136 138  K 3.7 3.5  CL 95* 98  CO2 24 24  GLUCOSE 118* 116*  BUN 74* 63*  CREATININE 8.56* 6.97*  CALCIUM 8.7* 9.0  PHOS 5.1* 5.3*   Liver Function Tests: Recent Labs  Lab 04/15/19 1407 04/17/19 1313  ALBUMIN 2.0* 2.0*   CBC: Recent Labs  Lab 04/13/19 0550 04/14/19 0714 04/15/19 1407 04/17/19 1313  WBC 15.6* 14.0* 15.9* 13.9*  HGB 8.2* 8.7* 8.4* 8.3*  HCT 27.2* 28.5* 27.2* 27.8*  MCV 94.4 94.1 93.2 93.6  PLT 550* 565* 606* 629*   Blood Culture    Component Value Date/Time   SDES BLOOD RIGHT HAND 04/13/2019 1140   SPECREQUEST  04/13/2019 1140    BOTTLES DRAWN AEROBIC ONLY Blood Culture adequate volume   CULT  04/13/2019 1140    NO GROWTH 5 DAYS Performed at [facility], [address], [place], [place] [address]    REPTSTATUS 04/18/2019 FINAL 04/13/2019 1140     Lab Results  Component Value Date   INR 2.3 (H) 04/19/2019   INR 3.1 (H) 04/18/2019   INR 5.4 (HH) 04/17/2019   PROTIME 21.6 (H) 05/26/2014   PROTIME 26.4 (H) 05/08/2014   PROTIME 31.2 (H) 01/26/2014   Studies/Results: No results found.  Medications: . ferric gluconate (FERRLECIT/NULECIT) IV 125 mg (04/17/19 1611)   . ALPRAZolam  0.5 mg Oral TID  . calcitRIOL  0.25 mcg Oral Q T,Th,Sa-HD  . Chlorhexidine Gluconate Cloth  6 each Topical BID  . colestipol  2 g Oral BID  . darbepoetin (ARANESP) injection - DIALYSIS  200 mcg Intravenous Q Thu-HD  . feeding supplement (PRO-STAT SUGAR FREE 64)  30 mL Oral BID  . Gerhardt's butt cream   Topical QID  . heparin      . metoprolol tartrate  12.5 mg Oral BID  . midodrine  10 mg Oral TID WC  . multivitamin  1 tablet Oral QHS  . pantoprazole  40 mg Oral BID  . polycarbophil  625 mg Oral TID WC  . pregabalin  25 mg Oral QHS  . saccharomyces boulardii  500 mg Oral TID  . sertraline  50 mg Oral Daily  . sevelamer carbonate  1,600 mg Oral  TID WC  . warfarin  5 mg Oral ONCE-1800  . Warfarin - Pharmacist Dosing Inpatient   Does not apply q1800    Dialysis Orders: Previously on PD ->transitioned recently to HD after episode peritonitis. Non [facility]A pt  Assessment/Plan: 1.Debility:PT/OT per CIR.  2. ESRD:PD cath removed 11/16 at another facility. Continue on HD TTS schedulehere.  HD today.  Next HD on 12/22. K 3.5, use 3K bath.   3.HTN/volume:BP well controlled. on midodrine 10 mg TID.  Does not appear grossly volume overloaded. Continue to titrate down volume as tolerated.   4. Anemia:Hgb stable 8.3. Aranesp 230mcg q Thurs (his usual dose)andcourseofIV iron. Hx recent GIB - stable gastric ulcer, on PPI.  5. Secondary hyperparathyroidism:Continue VDRA/binder.  Calcium and phos in goal.  6. Nutrition:Alb 2 continue pro-stat supplements.  Renal diet w/fluid restrictions.   7. Hx mechanical AoV, A-fib/flutter:  Warfarin per pharmacy - hold heparin with HD. Note on metoprolol   8. T2DM - per primary  9. Hx testicular cancer  10. Leukocytosis -plateaued- does have TDC and leg wounds -blood cultures NGTD 12/13  11. Frequent bowel movements - GI consulting, given colestipol  Calvin Mow, PA-C [facility] Pager: [phone] 04/19/2019,1:40 PM  LOS: 12 days   I have seen and examined this patient and agree with plan and assessment in the above note with renal recommendations/intervention highlighted.  Calvin John A Loletta Harper,MD 04/19/2019 2:19 PM

## 2019-04-19 NOTE — Progress Notes (Addendum)
Subjective: Patient received 2 g of colestipol yesterday afternoon.  He states he had 1 bowel movement overnight and 1 bowel movement today.  He complains of perianal soreness.  Objective: Vital signs in last 24 hours: Temp:  [97.8 F (36.6 C)-98.5 F (36.9 C)] 97.9 F (36.6 C) (12/19 0351) Pulse Rate:  [83-100] 100 (12/19 0900) Resp:  [18-20] 18 (12/19 0351) BP: (94-110)/(42-61) 100/58 (12/19 0900) SpO2:  [94 %-99 %] 94 % (12/19 0900) Weight:  [131.7 kg] 131.7 kg (12/19 0351) Weight change: -3 kg Last BM Date: 04/18/19  PE: Lying on bed, not in discomfort GENERAL: Obese PERIANAL AREA: Excoriated skin, erythematous perianal area EXTREMITIES: No deformity  Lab Results: Results for orders placed or performed during the hospital encounter of 04/04/2019 (from the past 48 hour(s))  Protime-INR     Status: Abnormal   Collection Time: 04/17/19  1:00 PM  Result Value Ref Range   Prothrombin Time 49.6 (H) 11.4 - 15.2 seconds   INR 5.4 (HH) 0.8 - 1.2    Comment: REPEATED TO VERIFY CRITICAL RESULT CALLED TO, READ BACK BY AND VERIFIED WITH: NIDA,M RN @1508  ON [id number] BY FLEMINGS (NOTE) INR goal varies based on device and disease states. Performed at [facility], [place] [address] [place], [place] [address]   CBC     Status: Abnormal   Collection Time: 04/17/19  1:13 PM  Result Value Ref Range   WBC 13.9 (H) 4.0 - 10.5 K/uL   RBC 2.97 (L) 4.22 - 5.81 MIL/uL   Hemoglobin 8.3 (L) 13.0 - 17.0 g/dL   HCT 27.8 (L) 39.0 - 52.0 %   MCV 93.6 80.0 - 100.0 fL   MCH 27.9 26.0 - 34.0 pg   MCHC 29.9 (L) 30.0 - 36.0 g/dL   RDW 17.2 (H) 11.5 - 15.5 %   Platelets 629 (H) 150 - 400 K/uL   nRBC 0.0 0.0 - 0.2 %    Comment: Performed at [facility] [address], [place], [place] [address]  Renal function panel     Status: Abnormal   Collection Time: 04/17/19  1:13 PM  Result Value Ref Range   Sodium 138 135 - 145 mmol/L   Potassium 3.5 3.5 - 5.1 mmol/L   Chloride 98 98 - 111  mmol/L   CO2 24 22 - 32 mmol/L   Glucose, Bld 116 (H) 70 - 99 mg/dL   BUN 63 (H) 8 - 23 mg/dL   Creatinine, Ser 6.97 (H) 0.61 - 1.24 mg/dL   Calcium 9.0 8.9 - 10.3 mg/dL   Phosphorus 5.3 (H) 2.5 - 4.6 mg/dL   Albumin 2.0 (L) 3.5 - 5.0 g/dL   GFR calc non Af Amer 8 (L) >60 mL/min   GFR calc Af Amer 9 (L) >60 mL/min   Anion gap 16 (H) 5 - 15    Comment: Performed at [facility] [address] [place], [place] [address]  Protime-INR     Status: Abnormal   Collection Time: 04/18/19  6:46 AM  Result Value Ref Range   Prothrombin Time 32.2 (H) 11.4 - 15.2 seconds   INR 3.1 (H) 0.8 - 1.2    Comment: (NOTE) INR goal varies based on device and disease states. Performed at [facility], [place] [address] [place], [place] [address]   Protime-INR     Status: Abnormal   Collection Time: 04/19/19  5:56 AM  Result Value Ref Range   Prothrombin Time 25.2 (H) 11.4 -  15.2 seconds   INR 2.3 (H) 0.8 - 1.2    Comment: (NOTE) INR goal varies based on device and disease states. Performed at [facility], [place] [address] [place], [place] [address]     Studies/Results: No results found.  Medications: I have reviewed the patient's current medications.  Assessment: Fecal incontinence with perianal wound  Currently in rehab for debility, multiple comorbidities occluding end-stage renal disease on dialysis, hypertension, anemia, mechanical aortic valve, atrial flutter and fibrillation, diabetes, history of testicular cancer  Plan: Continue small, frequent meals of low fiber, low residue diet. Increase colestipol to 2 g BID., Lomotil to be used if needed.  Continue Florastor. Continue perianal wound care with Gerhard's butt cream.  Ronnette Juniper, MD 04/19/2019, 11:07 AM

## 2019-04-19 NOTE — Progress Notes (Signed)
Occupational Therapy Session Note  Patient Details  Name: Calvin [name] MRN: [id number] Date of Birth: 1955-06-15  Today's Date: 04/19/2019 OT Individual Time: 1020-1110 OT Individual Time Calculation (min): 50 min    Short Term Goals: Week 2:  OT Short Term Goal 1 (Week 2): Pt will remain OOB in w/c for 45 min to increase functional activity tolerance OT Short Term Goal 2 (Week 2): Pt will complete slideboard transfer to [facility] with max +1 OT Short Term Goal 3 (Week 2): Pt will consistently get OOB 1x a day   Skilled Therapeutic Interventions/Progress Updates:    Pt received supine with no c/o pain. Pt frustrated by morning session not going well but willing to attempt transfer again. Pt transitioned to sitting EOB with cueing/edu provided for body mechanics related to sidelying to sit vs bringing legs off the bed first and doing a full sit up. Pt able to lift trunk with mod A overall. Pt completed lateral lean for beasy board placement with mod A. Pt completed beasy board transfer to TIS w/c with max A, +2 present for safety but no assist needed otherwise. Pt reported not feeling up to attempting standing today. Pt completed oral care at the sink with set up assist. Pt reported he had another incontinent BM. +2 assistance obtained and pt completed beasy board transfer back to bed with max A. Pt required total A for peri hygiene at bed level. Discussed BM's with GI MD and pt. Pt was left supine with all needs met, bed alarm set.   Therapy Documentation Precautions:  Precautions Precautions: Fall Precaution Comments: monitor HR, L heel wound Restrictions Weight Bearing Restrictions: No   Therapy/Group: Individual Therapy  Curtis Sites 04/19/2019, 6:59 AM

## 2019-04-19 NOTE — Progress Notes (Addendum)
[facility] PHYSICAL MEDICINE & REHABILITATION PROGRESS NOTE   Subjective/Complaints: Patient lying in bed this morning.  He states he slept well overnight.  He notes his bottom is still sore.  He was seen by GI yesterday, notes reviewed.  ROS: + Sore buttock. Denies CP, SOB, N/V/D   Objective:   No results found. Recent Labs    04/17/19 1313 04/19/19 1409  WBC 13.9* 13.4*  HGB 8.3* 8.6*  HCT 27.8* 28.1*  PLT 629* 681*   Recent Labs    04/17/19 1313 04/19/19 1409  NA 138 135  K 3.5 4.0  CL 98 96*  CO2 24 23  GLUCOSE 116* 106*  BUN 63* 62*  CREATININE 6.97* 6.70*  CALCIUM 9.0 9.5    Intake/Output Summary (Last 24 hours) at 04/19/2019 1524 Last data filed at 04/19/2019 0830 Gross per 24 hour  Intake 120 ml  Output --  Net 120 ml     Physical Exam: Vital Signs Blood pressure (!) 100/58, pulse 100, temperature 97.9 F (36.6 C), temperature source Oral, resp. rate 18, height 6\' 2"  (1.88 m), weight 131.7 kg, SpO2 94 %. Constitutional: No distress . Vital signs reviewed.  Morbidly obese. HENT: Normocephalic.  Atraumatic. Eyes: EOMI. No discharge. Cardiovascular: No JVD. Respiratory: Normal effort.  No stridor. GI: Non-distended. Skin: Warm and dry.  Intact. Psych: Normal mood.  Normal behavior. Musc: Lower extremity edema  Neurological: Alert Motor: Bilateral upper extremities: 4/5 proximal distal Right lower extremity: 2/5 proximal distal Left lower extremity: 2-/5 proximal to distal  Assessment/Plan: 1. Functional deficits secondary to debility which require 3+ hours per day of interdisciplinary therapy in a comprehensive inpatient rehab setting.  Physiatrist is providing close team supervision and 24 hour management of active medical problems listed below.  Physiatrist and rehab team continue to assess barriers to discharge/monitor patient progress toward functional and medical goals  Care Tool:  Bathing  Bathing activity did not occur:  Refused Body parts bathed by patient: Front perineal area, Buttocks         Bathing assist Assist Level: 2 Helpers     Upper Body Dressing/Undressing Upper body dressing   What is the patient wearing?: Pull over shirt    Upper body assist Assist Level: Supervision/Verbal cueing    Lower Body Dressing/Undressing Lower body dressing      What is the patient wearing?: Incontinence brief     Lower body assist Assist for lower body dressing: 2 Helpers     Toileting Toileting    Toileting assist Assist for toileting: 2 Helpers     Transfers Chair/bed transfer  Transfers assist  Chair/bed transfer activity did not occur: Safety/medical concerns  Chair/bed transfer assist level: 2 Helpers     Locomotion Ambulation   Ambulation assist   Ambulation activity did not occur: Safety/medical concerns          Walk 10 feet activity   Assist  Walk 10 feet activity did not occur: Safety/medical concerns        Walk 50 feet activity   Assist Walk 50 feet with 2 turns activity did not occur: Safety/medical concerns         Walk 150 feet activity   Assist Walk 150 feet activity did not occur: Safety/medical concerns         Walk 10 feet on uneven surface  activity   Assist Walk 10 feet on uneven surfaces activity did not occur: Safety/medical concerns         Wheelchair  Assist Will patient use wheelchair at discharge?: Yes Type of Wheelchair: Manual Wheelchair activity did not occur: Safety/medical concerns  Wheelchair assist level: Minimal Assistance - Patient > 75% Max wheelchair distance: 5'    Wheelchair 50 feet with 2 turns activity    Assist    Wheelchair 50 feet with 2 turns activity did not occur: Safety/medical concerns       Wheelchair 150 feet activity     Assist  Wheelchair 150 feet activity did not occur: Safety/medical concerns       Blood pressure (!) 100/58, pulse 100, temperature 97.9 F (36.6  C), temperature source Oral, resp. rate 18, height 6\' 2"  (1.88 m), weight 131.7 kg, SpO2 94 %.  Medical Problem List and Plan: 1.  Impaired mobility and ADLs secondary to debility   Continue CIR  2.  St Jude's Aortic Valve/Antithrombotics: -DVT/anticoagulation:  Pharmaceutical: Coumadin  INR subtherapeutic on 12/19             -antiplatelet therapy: N/A 3. Pain Management: Oxycodone prn  -resumed lyrica at hs only 25mg , added additional 25 mg postdialysis 4. Mood: LCSW to follow for evaluation and support.              -antipsychotic agents: N/A  -anxiety disorder/ recent MDD   - increased xanax back to 0.5mg ,    -continue to provide positive reinforcement. Trying to empower him to take some initiative and accountability for his care and progress  Zoloft increased to home dose of 100 mg 5. Neuropsych: This patient is capable of making decisions on his own behalf. 6. Skin/Wound Care: Air mattress overlay for MASD due to ongoing diarrhea.    -some improvement especially at peripheries of area   - gerhardt's cream to buttocks  -rx loose stools  -prevalon boots bilateral heels 7. Fluids/Electrolytes/Nutrition: Strict I/O. Renal diet with 1200 cc FR.  8.   ESRD: Now on HD. Schedule hemodialysis at the end of the day to help with therapy tolerance. TTS schedule   12/10: diet liberalized to help with intake  -volume/edema mgt per nephrology   Filed Weights   04/17/19 1716 04/18/19 0410 04/19/19 0351  Weight: 131.6 kg 130.4 kg 131.7 kg    Stable on 12/19 9. Hypotension: Monitor BP tid--continue midodrine.  Relatively controlled on 12/19 10 History of A fib/A flutter: Monitor HR t--on coumadin. Continue Lopressor 12.5mg  BID 11. Recent GIB/Anemia of chronic disease: Continue to monitor H/H with serial checks. On Retacrit 10,000 units with HD.     hgb/tranfusion per nephrology-hemoglobin 8.3 on 12/17 12. Loose stool: C diff negative  -continue fiber  -increased probiotic to 500mg    TID  -diet--reinforced that consistent PO intake is important also  -12/18 stopped megace given it's lack of benefit and potential for loose stool (although was having loose stool long before this was started)  Appreciate GI recs, medications initiated 13. Cough with phlegm: Guaifenesin prn 14. Shortness of breath at rest: encourage IS, anxiety mgt. Continue to follow 15. Leukocytosis:  -wbc's 13.9 on 12/17  Afebrile  -bc negative no growth 16.  Morbid obesity: Encouraged weight loss  LOS: 12 days A FACE TO FACE EVALUATION WAS PERFORMED  Gayatri Teasdale Lorie Phenix 04/19/2019, 3:24 PM

## 2019-04-19 NOTE — Progress Notes (Signed)
Wife refuses chg bath on lower extremities claims it is getting dry.Explained the reason for CHG bath.

## 2019-04-19 NOTE — Progress Notes (Signed)
Physical Therapy Session Note  Patient Details  Name: Calvin [name] MRN: [id number] Date of Birth: [birth year]  Today's Date: 04/19/2019 PT Individual Time: 0805-0900 PT Individual Time Calculation (min): 55 min   Short Term Goals: Week 2:  PT Short Term Goal 1 (Week 2): Pt will perform bed mobiity with mod assist PT Short Term Goal 2 (Week 2): Pt will be able to propel w/c x 50' with supervision PT Short Term Goal 3 (Week 2): Pt will be able to perform bed <> chair transfers with max +1 assist  Skilled Therapeutic Interventions/Progress Updates:   Pt received supine in bed and agreeable to PT. Rolling R and L with mod assist for either direction to PT to doff/don brief and perform pericare while pt in sidelying. PT applied butt cream and ointment to gluteal cleft and around rectum while in sidelying per pt request. Supine>sit transfer with mod assist through log roll with cues for technique and sequencing.  Once in sitting, pt reports significant dizziness. No nystagmus noted. No change in skin color or increased HR noted. Multimodal cues for pursed lip breathing to prevent hyper ventilation. PT assisted pt to apply deodorant while sitting EOB, as pt reports need for at least 1 UE for support, but spontaneously lifting BUE off mattress with no overt LOB noted. Doff and don shift with max assist for as well as to don pants up to thighs while sitting EOB. Pt then performed sit>supine with min assist for LE management only, stating that he could no longer tolerate sitting EOB. Rolling R and L with max assist due to poor position in bed to don pants. scootting to [facility] with max assist from PT with bed in Trendelenburg. BLE therex hip abduction x 20, clam shells with manual resistance x 15, hip/knee flexion/extension with manual resistance x 10. Pt left in bed with call bell in reach.      Therapy Documentation Precautions:  Precautions Precautions: Fall Precaution Comments: monitor HR, L heel  wound Restrictions Weight Bearing Restrictions: No    Vital Signs: Therapy Vitals Pulse Rate: 100 BP: (!) 100/58 Oxygen Therapy SpO2: 94 % Pain:   denies   Therapy/Group: Individual Therapy  Lorie Phenix 04/19/2019, 9:09 AM

## 2019-04-19 NOTE — Plan of Care (Signed)
  Problem: RH BOWEL ELIMINATION Goal: RH STG MANAGE BOWEL W/MEDICATION W/ASSISTANCE Description: STG Manage Bowel with Medication with Mod Assistance. Outcome: Not Progressing; patient still has diarrhea ; GI MD aware meds adjusted   Problem: RH SKIN INTEGRITY Goal: RH STG SKIN FREE OF INFECTION/BREAKDOWN Description: No new breakdown with mod assist  Outcome: Not Progressing; due to diarrhea cont regimen gerharts butt cream

## 2019-04-20 ENCOUNTER — Inpatient Hospital Stay (HOSPITAL_COMMUNITY): Payer: Medicare Other

## 2019-04-20 DIAGNOSIS — I953 Hypotension of hemodialysis: Secondary | ICD-10-CM

## 2019-04-20 DIAGNOSIS — I4891 Unspecified atrial fibrillation: Secondary | ICD-10-CM

## 2019-04-20 LAB — PROTIME-INR
INR: 1.9 — ABNORMAL HIGH (ref 0.8–1.2)
Prothrombin Time: 21.6 seconds — ABNORMAL HIGH (ref 11.4–15.2)

## 2019-04-20 MED ORDER — METOPROLOL TARTRATE 12.5 MG HALF TABLET
12.5000 mg | ORAL_TABLET | Freq: Two times a day (BID) | ORAL | Status: DC
  Administered 2019-04-21 – 2019-05-09 (×25): 12.5 mg via ORAL
  Filled 2019-04-20 (×35): qty 1

## 2019-04-20 MED ORDER — HEPARIN (PORCINE) 25000 UT/250ML-% IV SOLN: 1550.0000 [IU]/h | INTRAVENOUS | Status: DC

## 2019-04-20 MED ORDER — WARFARIN SODIUM 5 MG PO TABS
5.0000 mg | ORAL_TABLET | Freq: Once | ORAL | Status: AC
  Administered 2019-04-20: 18:00:00 5 mg via ORAL
  Filled 2019-04-20: qty 1

## 2019-04-20 NOTE — Progress Notes (Addendum)
ANTICOAGULATION CONSULT NOTE - Follow Up Consult  Pharmacy Consult for Coumadin + heparin bridge Indication: St Jude AVR 1990s + Afib  Patient Measurements: Height: 6\' 2"  (188 cm) Weight: 295 lb 6.7 oz (134 kg) IBW/kg (Calculated) : 82.2  Heparin dosing weight:  111 kg  Vital Signs: Temp: 98.3 F (36.8 C) (12/20 0313) BP: 125/58 (12/20 0313) Pulse Rate: 93 (12/20 0313)  Labs: Recent Labs    04/15/19 0603 04/15/19 1407 04/16/19 0504 04/17/19 1300 04/17/19 1313  HGB  --  8.4*  --   --  8.3*  HCT  --  27.2*  --   --  27.8*  PLT  --  606*  --   --  629*  LABPROT 38.9*  --  44.9* 49.6*  --   INR 4.0*  --  4.8* 5.4*  --   CREATININE  --  8.56*  --   --  6.97*    Estimated Creatinine Clearance: 16.6 mL/min (A) (by C-G formula based on SCr of 6.7 mg/dL (H)).  Assessment: 38 yom on warfarin PTA for hx St Jude AVR (1990s) + Afib (CHA2DS2-VASc = 1). Goal INR range should be 2.5-3.5 given indication; however, per discussion with patient, he was told to aim for 2.5 in mid 2019 by his MD due to history of GIB. Per chart review, appears that GIB events are related to procedures (polypectomy) and GI ulcers.  Pharmacy consulted to start heparin bridge this AM for INR now sub therapeutic at 1.9 (trend down), likely due to held doses 12/16 and 12/17 and patient given 1mg  IV vitamin K on 12/17 due to supratherapeutic INR. CBC stable. No active bleed issues documented.  PTA warfarin dose: 5 mg daily   Goal of Therapy:  INR 2.5-3 Monitor platelets by anticoagulation protocol: Yes   Plan:  No bolus. Start heparin 1550 units/hr while subtherapeutic. Will start on the lower end of range due to history of GIB Warfarin 5 mg PO x 1 dose tonight 8h heparin level Monitor daily heparin level/INR/CBC, s/sx bleeding   Elicia Lamp, PharmD, BCPS Please check AMION for all [facility] contact numbers Clinical Pharmacist 04/20/2019 12:09 PM   ADDENDUM - Patient/wife refusing heparin bridge for  today. He would like to wait and see where his INR is at tomorrow and reassess, despite discussion with pharmacist surrounding his risks with subtherapeutic INR on warfarin without a bridge. Spoke with Dr. Posey Pronto and will d/c heparin consult for now and re-evaluate in the morning. Patient is requesting Lovenox instead but would be more hesitant to choose this option given his ESRD on HD and history of bleeding.   Elicia Lamp, PharmD, BCPS Please check AMION for all [place] contact numbers Clinical Pharmacist 04/20/2019 3:17 PM

## 2019-04-20 NOTE — Progress Notes (Signed)
Patient claims he does not want to start on heparin drip  and wants to speak with the pharmacist; Pharmacist notified.

## 2019-04-20 NOTE — Progress Notes (Signed)
Subjective: Patient states he barely slept 2 hours overnight as he had 7 loose bowel movements.  Last bowel movement was at 6 AM today.  Not had a bowel movement so far.  He states he is very tired.  Objective: Vital signs in last 24 hours: Temp:  [97.8 F (36.6 C)-98.3 F (36.8 C)] 98.3 F (36.8 C) (12/20 0313) Pulse Rate:  [93-104] 93 (12/20 0313) Resp:  [16-18] 16 (12/20 0313) BP: (84-125)/(44-58) 125/58 (12/20 0313) SpO2:  [95 %-100 %] 100 % (12/20 0313) Weight:  [130 kg-134 kg] 134 kg (12/20 0313) Weight change: -0.6 kg Last BM Date: 04/20/19  PE: Obese but chronically ill-appearing, prominent pallor, appears tired GENERAL: Lying on bed ABDOMEN: Soft, nondistended, well-healed scar from removal of peritoneal dialysis catheter EXTREMITIES: No deformity  Lab Results: Results for orders placed or performed during the hospital encounter of 04/24/2019 (from the past 48 hour(s))  Protime-INR     Status: Abnormal   Collection Time: 04/19/19  5:56 AM  Result Value Ref Range   Prothrombin Time 25.2 (H) 11.4 - 15.2 seconds   INR 2.3 (H) 0.8 - 1.2    Comment: (NOTE) INR goal varies based on device and disease states. Performed at [facility], [place] [address], [place], [place] [address]   Renal function panel     Status: Abnormal   Collection Time: 04/19/19  2:09 PM  Result Value Ref Range   Sodium 135 135 - 145 mmol/L   Potassium 4.0 3.5 - 5.1 mmol/L   Chloride 96 (L) 98 - 111 mmol/L   CO2 23 22 - 32 mmol/L   Glucose, Bld 106 (H) 70 - 99 mg/dL   BUN 62 (H) 8 - 23 mg/dL   Creatinine, Ser 6.70 (H) 0.61 - 1.24 mg/dL   Calcium 9.5 8.9 - 10.3 mg/dL   Phosphorus 3.7 2.5 - 4.6 mg/dL   Albumin 2.3 (L) 3.5 - 5.0 g/dL   GFR calc non Af Amer 8 (L) >60 mL/min   GFR calc Af Amer 9 (L) >60 mL/min   Anion gap 16 (H) 5 - 15    Comment: Performed at [facility] [address], [place], [place] [address]  CBC     Status: Abnormal   Collection Time: 04/19/19  2:09 PM  Result  Value Ref Range   WBC 13.4 (H) 4.0 - 10.5 K/uL   RBC 3.04 (L) 4.22 - 5.81 MIL/uL   Hemoglobin 8.6 (L) 13.0 - 17.0 g/dL   HCT 28.1 (L) 39.0 - 52.0 %   MCV 92.4 80.0 - 100.0 fL   MCH 28.3 26.0 - 34.0 pg   MCHC 30.6 30.0 - 36.0 g/dL   RDW 17.0 (H) 11.5 - 15.5 %   Platelets 681 (H) 150 - 400 K/uL   nRBC 0.0 0.0 - 0.2 %    Comment: Performed at [facility] [address], [place], [place] [address]  Protime-INR     Status: Abnormal   Collection Time: 04/20/19  8:17 AM  Result Value Ref Range   Prothrombin Time 21.6 (H) 11.4 - 15.2 seconds   INR 1.9 (H) 0.8 - 1.2    Comment: (NOTE) INR goal varies based on device and disease states. Performed at [facility], [place] [address], [place], [place] [address]     Studies/Results: No results found.  Medications: I have reviewed the patient's current medications.  Assessment: Fecal incontinence, multiple loose bowel movements(multifactorial etiology-prior antibiotic use, history of  C. difficile infection, autonomic neuropathy) Perianal excoriation  Multiple medical problems-end-stage renal disease on hemodialysis: Tuesday/Thursday/Saturday, mechanical aortic valve and DVT on Coumadin, history of atrial flutter/atrial fibrillation  Plan: I have increased colestipol to 2 g twice daily, will see if this makes any difference in the next 24 hours. Patient also on Lomotil 1 tablet 4 times a day as needed. Continue probiotics. Continue wound care with Gerhardt's Butt cream 4 times a day  Ronnette Juniper, MD 04/20/2019, 12:45 PM

## 2019-04-20 NOTE — Progress Notes (Signed)
Physical Therapy Session Note  Patient Details  Name: Calvin [name] MRN: [id number] Date of Birth: Aug 13, 1955  Today's Date: 04/20/2019 PT Individual Time: 1638-4536 PT Individual Time Calculation ([name]): 30 [name]   Short Term Goals: Week 2:  PT Short Term Goal 1 (Week 2): Pt will perform bed mobiity with mod assist PT Short Term Goal 2 (Week 2): Pt will be able to propel w/c x 50' with supervision PT Short Term Goal 3 (Week 2): Pt will be able to perform bed <> chair transfers with max +1 assist  Skilled Therapeutic Interventions/Progress Updates: Pt presented in bed with wife present initially refusing all therapies due to fatigue and pt having not eaten lunch. Wife provided lunch and agreeable to PTA checking back in after eaten.  Once PTA returned pt agreeable to participate in supine therex only refusing to even sit at EOB. Pt attempted to reposition with PTA supporting B feet but pt demonstrating limited initiation for pushing through BLE. Performed 2 person boost to [facility]. Pt participated in following therex: Ankle pumps, hip abd/add, manually resisted leg press, SAQ, pillow squeezes all 15-20 reps. SLR performed x 5 AROM and x5 AAROM due to pt fatigue. Pt also performed bicep curls x 10 4# RUE, 3# LUE, manually resisted tricep pushes x 10 and shoulder flexion to 90 AAROM LUE and with 3# wt RUE x 10.  Pt left with call bell within reach and bed alarm set.      Therapy Documentation Precautions:  Precautions Precautions: Fall Precaution Comments: monitor HR, L heel wound Restrictions Weight Bearing Restrictions: No General: PT Amount of Missed Time ([name]): 30 Minutes PT Missed Treatment Reason: Patient fatigue;Patient unwilling to participate Vital Signs: Therapy Vitals Temp: 98.2 F (36.8 C) Temp Source: Oral Pulse Rate: 89 Resp: 18 BP: 125/60 Patient Position (if appropriate): Lying Oxygen Therapy SpO2: 98 % O2 Device: Room Air   Therapy/Group: Individual  Therapy  Zarahi Fuerst  Mareesa Gathright, PTA  04/20/2019, 3:57 PM

## 2019-04-20 NOTE — Progress Notes (Signed)
RN mentioned to wife re: mattress replacement she said patient does not want to replace the mattress.

## 2019-04-20 NOTE — Progress Notes (Signed)
Brief note:  Please see progress note from earlier today as well.  Discussed with pharmacy again, patient refusing.  Risks and benefits explained to patient and wife, however they state they are aware and prefer to hold off on bridging until tomorrow.  Patient also does not want heparin GGT despite kidney function and requests Lovenox.  Will need to follow-up again tomorrow based on results of INR for the plan.  Patient and wife aware of risks/benefits.

## 2019-04-20 NOTE — Plan of Care (Signed)
  Problem: RH BOWEL ELIMINATION Goal: RH STG MANAGE BOWEL WITH ASSISTANCE Description: STG Manage Bowel with Mod Assistance. Outcome: Not Progressing; patient still has diarrhea; improving this afternoon   Problem: RH SKIN INTEGRITY Goal: RH STG SKIN FREE OF INFECTION/BREAKDOWN Description: No new breakdown with mod assist  Outcome: Not Progressing; WTA seen patient and writing new orders.

## 2019-04-20 NOTE — Progress Notes (Signed)
[place] PHYSICAL MEDICINE & REHABILITATION PROGRESS NOTE   Subjective/Complaints: Patient seen laying in bed this morning.  He states he did not sleep well overnight due to soiling his diaper and needing a change.  5-6 times.  Discussed recent GI recs with patient.  He notes improvement in buttock pain.  Later called by nursing regarding wife's concern for metoprolol and blood pressure, parameters placed.  ROS: + Sore buttock, improving. Denies CP, SOB, N/V/D   Objective:   No results found. Recent Labs    04/17/19 1313 04/19/19 1409  WBC 13.9* 13.4*  HGB 8.3* 8.6*  HCT 27.8* 28.1*  PLT 629* 681*   Recent Labs    04/17/19 1313 04/19/19 1409  NA 138 135  K 3.5 4.0  CL 98 96*  CO2 24 23  GLUCOSE 116* 106*  BUN 63* 62*  CREATININE 6.97* 6.70*  CALCIUM 9.0 9.5    Intake/Output Summary (Last 24 hours) at 04/20/2019 1114 Last data filed at 04/20/2019 0730 Gross per 24 hour  Intake 60 ml  Output 1000 ml  Net -940 ml     Physical Exam: Vital Signs Blood pressure (!) 125/58, pulse 93, temperature 98.3 F (36.8 C), resp. rate 16, height 6\' 2"  (1.88 m), weight 134 kg, SpO2 100 %. Constitutional: No distress . Vital signs reviewed.  Morbidly obese. HENT: Normocephalic.  Atraumatic. Eyes: EOMI. No discharge. Cardiovascular: No JVD. Respiratory: Normal effort.  No stridor. GI: Non-distended. Skin: Warm and dry.  Intact. Psych: Normal mood.  Normal behavior. Musc: Lower extremity edema Neurological: Alert Motor: Bilateral upper extremities: 4/5 proximal distal Right lower extremity: 2+/5 proximal distal Left lower extremity: 2/5 proximal to distal  Assessment/Plan: 1. Functional deficits secondary to debility which require 3+ hours per day of interdisciplinary therapy in a comprehensive inpatient rehab setting.  Physiatrist is providing close team supervision and 24 hour management of active medical problems listed below.  Physiatrist and rehab team continue  to assess barriers to discharge/monitor patient progress toward functional and medical goals  Care Tool:  Bathing  Bathing activity did not occur: Refused Body parts bathed by patient: Front perineal area, Buttocks         Bathing assist Assist Level: 2 Helpers     Upper Body Dressing/Undressing Upper body dressing   What is the patient wearing?: Pull over shirt    Upper body assist Assist Level: Supervision/Verbal cueing    Lower Body Dressing/Undressing Lower body dressing      What is the patient wearing?: Incontinence brief     Lower body assist Assist for lower body dressing: 2 Helpers     Toileting Toileting    Toileting assist Assist for toileting: 2 Helpers     Transfers Chair/bed transfer  Transfers assist  Chair/bed transfer activity did not occur: Safety/medical concerns  Chair/bed transfer assist level: 2 Helpers     Locomotion Ambulation   Ambulation assist   Ambulation activity did not occur: Safety/medical concerns          Walk 10 feet activity   Assist  Walk 10 feet activity did not occur: Safety/medical concerns        Walk 50 feet activity   Assist Walk 50 feet with 2 turns activity did not occur: Safety/medical concerns         Walk 150 feet activity   Assist Walk 150 feet activity did not occur: Safety/medical concerns         Walk 10 feet on uneven surface  activity  Assist Walk 10 feet on uneven surfaces activity did not occur: Safety/medical concerns         Wheelchair     Assist Will patient use wheelchair at discharge?: Yes Type of Wheelchair: Manual Wheelchair activity did not occur: Safety/medical concerns  Wheelchair assist level: Minimal Assistance - Patient > 75% Max wheelchair distance: 5'    Wheelchair 50 feet with 2 turns activity    Assist    Wheelchair 50 feet with 2 turns activity did not occur: Safety/medical concerns       Wheelchair 150 feet activity      Assist  Wheelchair 150 feet activity did not occur: Safety/medical concerns       Blood pressure (!) 125/58, pulse 93, temperature 98.3 F (36.8 C), resp. rate 16, height 6\' 2"  (1.88 m), weight 134 kg, SpO2 100 %.  Medical Problem List and Plan: 1.  Impaired mobility and ADLs secondary to debility   Continue CIR  2.  St Jude's Aortic Valve/Antithrombotics: -DVT/anticoagulation:  Pharmaceutical: Coumadin  INR subtherapeutic on 12/20, discussed with pharmacy, will bridge again with Heparin ggt             -antiplatelet therapy: N/A 3. Pain Management: Oxycodone prn  -resumed lyrica at hs only 25mg , added additional 25 mg postdialysis 4. Mood: LCSW to follow for evaluation and support.              -antipsychotic agents: N/A  -anxiety disorder/ recent MDD   - increased xanax back to 0.5mg ,    -continue to provide positive reinforcement. Trying to empower him to take some initiative and accountability for his care and progress  Zoloft increased to home dose of 100 mg on 12/20 5. Neuropsych: This patient is capable of making decisions on his own behalf. 6. Skin/Wound Care: Air mattress overlay for MASD due to ongoing diarrhea.    -gerhardt's cream to buttocks  -rx loose stools  -prevalon boots bilateral heels 7. Fluids/Electrolytes/Nutrition: Strict I/O. Renal diet with 1200 cc FR.  8.   ESRD: Now on HD. Schedule hemodialysis at the end of the day to help with therapy tolerance. TTS schedule   12/10: diet liberalized to help with intake  -volume/edema mgt per nephrology   [facility] Weights   04/19/19 1330 04/19/19 1744 04/20/19 0313  Weight: 131.1 kg 130 kg 134 kg    Stable on 12/20 9. Hypotension: Monitor BP tid--continue midodrine.  Relatively controlled and asymptomatic on 12/20 10 History of A fib/A flutter: Monitor HR -on coumadin. Continue Lopressor 12.5mg  BID, will consider Toprolol XL 11. Recent GIB/Anemia of chronic disease: Continue to monitor H/H with serial checks.  On Retacrit 10,000 units with HD.     hgb/tranfusion per nephrology-hemoglobin 8.6 on 12/19 12. Loose stool: C diff negative  -continue fiber  -increased probiotic to 500mg   TID  -diet--reinforced that consistent PO intake is important also  -12/18 stopped megace given it's lack of benefit and potential for loose stool (although was having loose stool long before this was started)  Appreciate GI recs, medication initiated, await further recs 13. Cough with phlegm: Guaifenesin prn 14. Shortness of breath at rest: encourage IS, anxiety mgt.  Improved 15. Leukocytosis:  -wbc's 13.4 on 12/19  Afebrile  -bc negative no growth 16.  Morbid obesity: Encouraged weight loss  LOS: 13 days A FACE TO FACE EVALUATION WAS PERFORMED  Calvin [name] 04/20/2019, 11:14 AM

## 2019-04-20 NOTE — Progress Notes (Addendum)
Occupational Therapy Session Note  Patient Details  Name: Calvin [name] MRN: [id number] Date of Birth: [birth year]  Today's Date: 04/20/2019 OT Individual Time: 6825-7493 OT Individual Time Calculation (min): 14 min  OT Missed Time: 45 min   Short Term Goals: Week 2:  OT Short Term Goal 1 (Week 2): Pt will remain OOB in w/c for 45 min to increase functional activity tolerance OT Short Term Goal 2 (Week 2): Pt will complete slideboard transfer to [facility] with max +1 OT Short Term Goal 3 (Week 2): Pt will consistently get OOB 1x a day  Skilled Therapeutic Interventions/Progress Updates:    Pt received supine, frustrated by 7 bowel movements last night. Pt declining participation in any therapy today 2/2 last night's events and resultant fatigue/pain. Reviewed tomorrow's schedule and collaborated with pt in setting goals for each session. Pt completed bed mobility, rolling R with max A for OT to place pillow to offload pressure directly on sacrum. Pt left supine with all needs met, bed alarm set. 45 min missed.   Therapy Documentation Precautions:  Precautions Precautions: Fall Precaution Comments: monitor HR, L heel wound Restrictions Weight Bearing Restrictions: No   Therapy/Group: Individual Therapy  Curtis Sites 04/20/2019, 6:18 AM

## 2019-04-20 NOTE — Progress Notes (Signed)
Patient has severe macerated skin to the buttock folds. It is recommended by this WTA to continue to use patient's baby wipes, because they are alcohol free, to clean after each BM. Patient's spouse will continue to provide. It is also recommended to use Vaseline gauze over the inner folds to help with healing. Applying a dry gauze will pull at the skin and cause bleeding. Staff may also continue to use Gerhardt's Butt cream or A&D ointment that his spouse has provided.  Dorthula Nettles, RN3, CBIS, CRRN, [facility]

## 2019-04-20 NOTE — Progress Notes (Addendum)
[facility] Progress Note   Subjective:   Patient seen and examined at bedside.  No new complaints.  Tolerating dialysis well.    Objective Vitals:   04/19/19 1744 04/19/19 1858 04/20/19 0313 04/20/19 1442  BP: (!) 104/46 (!) 106/48 (!) 125/58 125/60  Pulse: (!) 103 (!) 103 93 89  Resp: 16 18 16 18   Temp: 98 F (36.7 C) 97.8 F (36.6 C) 98.3 F (36.8 C) 98.2 F (36.8 C)  TempSrc: Oral Oral  Oral  SpO2: 96% 100% 100% 98%  Weight: 130 kg  134 kg   Height:       Physical Exam General:NAD, chronically ill appearing male, laying in bed Heart:RRR, +valve click Lungs:CTAB Abdomen:soft, NTND Extremities:trace LE edema Dialysis Access: R [facility]   Filed Weights   04/19/19 1330 04/19/19 1744 04/20/19 0313  Weight: 131.1 kg 130 kg 134 kg    Intake/Output Summary (Last 24 hours) at 04/20/2019 1500 Last data filed at 04/20/2019 0730 Gross per 24 hour  Intake 60 ml  Output 1000 ml  Net -940 ml    Additional Objective Labs: Basic Metabolic Panel: Recent Labs  Lab 04/15/19 1407 04/17/19 1313 04/19/19 1409  NA 136 138 135  K 3.7 3.5 4.0  CL 95* 98 96*  CO2 24 24 23   GLUCOSE 118* 116* 106*  BUN 74* 63* 62*  CREATININE 8.56* 6.97* 6.70*  CALCIUM 8.7* 9.0 9.5  PHOS 5.1* 5.3* 3.7   Liver Function Tests: Recent Labs  Lab 04/15/19 1407 04/17/19 1313 04/19/19 1409  ALBUMIN 2.0* 2.0* 2.3*   CBC: Recent Labs  Lab 04/14/19 0714 04/15/19 1407 04/17/19 1313 04/19/19 1409  WBC 14.0* 15.9* 13.9* 13.4*  HGB 8.7* 8.4* 8.3* 8.6*  HCT 28.5* 27.2* 27.8* 28.1*  MCV 94.1 93.2 93.6 92.4  PLT 565* 606* 629* 681*   Blood Culture    Component Value Date/Time   SDES BLOOD RIGHT HAND 04/13/2019 1140   SPECREQUEST  04/13/2019 1140    BOTTLES DRAWN AEROBIC ONLY Blood Culture adequate volume   CULT  04/13/2019 1140    NO GROWTH 5 DAYS Performed at [facility], [address] [place], [place] [address]    REPTSTATUS 04/18/2019 FINAL 04/13/2019 1140     Lab Results  Component Value Date   INR 1.9 (H) 04/20/2019   INR 2.3 (H) 04/19/2019   INR 3.1 (H) 04/18/2019   PROTIME 21.6 (H) 05/26/2014   PROTIME 26.4 (H) 05/08/2014   PROTIME 31.2 (H) 01/26/2014   Studies/Results: No results found.  Medications: . ferric gluconate (FERRLECIT/NULECIT) IV 125 mg (04/17/19 1611)  . heparin     . ALPRAZolam  0.5 mg Oral TID  . calcitRIOL  0.25 mcg Oral Q T,Th,Sa-HD  . Chlorhexidine Gluconate Cloth  6 each Topical BID  . colestipol  2 g Oral BID  . darbepoetin (ARANESP) injection - DIALYSIS  200 mcg Intravenous Q Thu-HD  . feeding supplement (PRO-STAT SUGAR FREE 64)  30 mL Oral BID  . Gerhardt's butt cream   Topical QID  . metoprolol tartrate  12.5 mg Oral BID  . midodrine  10 mg Oral TID WC  . multivitamin  1 tablet Oral QHS  . pantoprazole  40 mg Oral BID  . polycarbophil  625 mg Oral TID WC  . pregabalin  25 mg Oral QHS  . pregabalin  25 mg Oral Once per day on Mon Wed Fri  . saccharomyces boulardii  500 mg Oral TID  . sertraline  100 mg Oral Daily  . sevelamer carbonate  1,600 mg Oral TID WC  . warfarin  5 mg Oral ONCE-1800  . Warfarin - Pharmacist Dosing Inpatient   Does not apply q1800    Dialysis Orders: Previously on PD ->transitioned recently to HD after episode peritonitis. Non CKA pt  Assessment/Plan: 1.Debility:PT/OT per CIR.  2. ESRD:PD cath removed 11/16 at another facility. Continue on HD TTS schedulehere. HD yesterday tolerated well.  Next HD on 12/22.K 3.5, use 3K bath.   3.Hypotension/volume:BP well controlled.on midodrine 10 mg TID. Does not appear grossly volume overloaded. Continue to titrate down volume as tolerated. Use albumin w/HD for BP support. 1L removed yesterday.   4. Anemia:Hgb stable 8.3>8.6.Aranesp 273mcg q Thurs (his usual dose)andcourseofIV iron. Hx recent GIB - stable gastric ulcer, on PPI.  5. Secondary hyperparathyroidism:Continue VDRA/binder. Calcium and phos in  goal.  6. Nutrition:Alb 2 continue pro-stat supplements.Renal diet w/fluid restrictions.  7. Hx mechanical AoV, A-fib/flutter: Warfarin per pharmacy - hold heparin with HD. on metoprolol   8. T2DM - per primary  9. Hx testicular cancer  10.Leukocytosis -plateaued- does have TDCand leg wounds-blood cultures NGTD 12/13  11. Frequent bowel movements- GI consulting, given colestipol  Calvin Mow, PA-C [facility] Pager: [phone] 04/20/2019,3:00 PM  LOS: 13 days   I have seen and examined this patient and agree with plan and assessment in the above note with renal recommendations/intervention highlighted.  Continues to c/o diarrhea "all night" and discomfort at rectum.  Given colestipol and hopefully will decrease number of movements.  No BM since 6 am today.  Calvin John A Sabre Romberger,MD 04/20/2019 3:09 PM

## 2019-04-21 ENCOUNTER — Inpatient Hospital Stay (HOSPITAL_COMMUNITY): Payer: Medicare Other

## 2019-04-21 ENCOUNTER — Inpatient Hospital Stay (HOSPITAL_COMMUNITY): Payer: Medicare Other | Admitting: Physical Therapy

## 2019-04-21 LAB — CBC
HCT: 29.5 % — ABNORMAL LOW (ref 39.0–52.0)
Hemoglobin: 9 g/dL — ABNORMAL LOW (ref 13.0–17.0)
MCH: 28 pg (ref 26.0–34.0)
MCHC: 30.5 g/dL (ref 30.0–36.0)
MCV: 91.9 fL (ref 80.0–100.0)
Platelets: 645 10*3/uL — ABNORMAL HIGH (ref 150–400)
RBC: 3.21 MIL/uL — ABNORMAL LOW (ref 4.22–5.81)
RDW: 17.3 % — ABNORMAL HIGH (ref 11.5–15.5)
WBC: 14.1 10*3/uL — ABNORMAL HIGH (ref 4.0–10.5)
nRBC: 0.1 % (ref 0.0–0.2)

## 2019-04-21 LAB — PROTIME-INR
INR: 2.4 — ABNORMAL HIGH (ref 0.8–1.2)
Prothrombin Time: 26.4 seconds — ABNORMAL HIGH (ref 11.4–15.2)

## 2019-04-21 MED ORDER — WARFARIN SODIUM 5 MG PO TABS
5.0000 mg | ORAL_TABLET | Freq: Once | ORAL | Status: AC
  Administered 2019-04-21: 5 mg via ORAL
  Filled 2019-04-21: qty 1

## 2019-04-21 MED ORDER — CHLORHEXIDINE GLUCONATE CLOTH 2 % EX PADS
6.0000 | MEDICATED_PAD | Freq: Every day | CUTANEOUS | Status: DC
  Administered 2019-04-22 – 2019-04-23 (×2): 6 via TOPICAL

## 2019-04-21 MED ORDER — COLESTIPOL HCL 1 G PO TABS
4.0000 g | ORAL_TABLET | Freq: Two times a day (BID) | ORAL | Status: DC
  Administered 2019-04-21 – 2019-04-26 (×10): 4 g via ORAL
  Filled 2019-04-21 (×11): qty 4

## 2019-04-21 MED ORDER — PREGABALIN 25 MG PO CAPS
25.0000 mg | ORAL_CAPSULE | ORAL | Status: DC
  Administered 2019-04-27 – 2019-05-08 (×6): 25 mg via ORAL
  Filled 2019-04-21 (×8): qty 1

## 2019-04-21 NOTE — Progress Notes (Signed)
[facility] ROUNDING NOTE   Subjective:   This is a 63 year old male with a history of coronary disease status post repair and stent of correct a rotation of the aorta.  Michela Pitcher he is status post Environmental health practitioner AVR history of atrial fibrillation/flutter history of obesity obesity GERD testicular cancer type 2 diabetes chronic pain and is end-stage renal disease.  He was on peritoneal dialysis and was admitted 02/19/2019 to 03/02/2019 with sepsis from peritonitis and a Mallory-Weiss tear and a gastric ulcer.  He was readmitted to [facility] 03/12/2019 until 03/25/2019 with weakness diarrhea persistent leukocytosis and problems with PD catheter.  He was started on vancomycin and meropenem and the PD catheter was removed.  A PermCath was placed 03/17/2019 and transition to hemodialysis.  His hospital course was complicated by hypotension during dialysis and large volume diarrhea requiring rectal tube with deep tissue injury to the sacrum.  He was also anemic and required transfusions.  He was discharged with IV antibiotics to skilled nursing facility.  He continued on hemodialysis on a TTS schedule with midodrine for blood pressure support.  The rectal tube was removed 03/31/2019.  For his chronic diarrhea with perianal excoriation he was placed on colestipol 4 g twice daily and continued on Lomotil.  I appreciate his support and help from Dr. Michail Sermon.  He appears to be tolerating dialysis well and received 1 L ultrafiltration 04/19/2019   Blood pressure 114/46 pulse 83 temperature 98.4 O2 sats 90% room air  Sodium 135 potassium 4.0 chloride 96 CO2 23 BUN 60 creatinine 6.7 glucose 106 calcium 9.5 phosphorus 3.7 albumin 2.3  WBC 14.1 hemoglobin 9.0 platelets 645  Xanax 0.5 mg 3 times daily, colestipol 4 g twice daily, darbepoetin 200 mcg q. Thursday, metoprolol 12.5 mg twice daily, midodrine 10 mg 3 times daily, Protonix 40 mg daily Lyrica 25 mg daily, Zoloft 100 mg daily Renvela 1.6 g  with meals: Warfarin.  INR   Objective:  Vital signs in last 24 hours:  Temp:  [98.2 F (36.8 C)-98.4 F (36.9 C)] 98.4 F (36.9 C) (12/21 0539) Pulse Rate:  [77-93] 83 (12/21 0539) Resp:  [16-18] 16 (12/21 0539) BP: (90-125)/(40-60) 114/46 (12/21 0539) SpO2:  [97 %-98 %] 98 % (12/21 0539)  Weight change:  Filed Weights   04/19/19 1330 04/19/19 1744 04/20/19 0313  Weight: 131.1 kg 130 kg 134 kg    Intake/Output: I/O last 3 completed shifts: In: 220 [P.O.:220] Out: -    Intake/Output this shift:  Total I/O In: 120 [P.O.:120] Out: -   General:NAD, chronically ill appearing male, laying in bed Heart:RRR, +valve click Lungs:CTAB Abdomen:soft, NTND Extremities:trace LE edema Dialysis Access: Retta Diones [facility]    Basic Metabolic Panel: Recent Labs  Lab 04/15/19 1407 04/17/19 0723 04/17/19 1313 04/19/19 1409  NA 136  --  138 135  K 3.7  --  3.5 4.0  CL 95*  --  98 96*  CO2 24  --  24 23  GLUCOSE 118*  --  116* 106*  BUN 74*  --  63* 62*  CREATININE 8.56*  --  6.97* 6.70*  CALCIUM 8.7*  --  9.0 9.5  MG  --  1.9  --   --   PHOS 5.1*  --  5.3* 3.7    Liver Function Tests: Recent Labs  Lab 04/15/19 1407 04/17/19 1313 04/19/19 1409  ALBUMIN 2.0* 2.0* 2.3*   No results for input(s): LIPASE, AMYLASE in the last 168 hours. No results for input(s):  AMMONIA in the last 168 hours.  CBC: Recent Labs  Lab 04/15/19 1407 04/17/19 1313 04/19/19 1409 04/21/19 1024  WBC 15.9* 13.9* 13.4* 14.1*  HGB 8.4* 8.3* 8.6* 9.0*  HCT 27.2* 27.8* 28.1* 29.5*  MCV 93.2 93.6 92.4 91.9  PLT 606* 629* 681* 645*    Cardiac Enzymes: No results for input(s): CKTOTAL, CKMB, CKMBINDEX, TROPONINI in the last 168 hours.  BNP: Invalid input(s): POCBNP  CBG: No results for input(s): GLUCAP in the last 168 hours.  Microbiology: Results for orders placed or performed during the hospital encounter of 04/14/2019  C difficile quick scan w PCR reflex     Status: None   Collection Time:  04/25/2019 10:59 PM   Specimen: STOOL  Result Value Ref Range Status   C Diff antigen NEGATIVE NEGATIVE Final   C Diff toxin NEGATIVE NEGATIVE Final   C Diff interpretation No C. difficile detected.  Final    Comment: Performed at [facility], [place] [address], [place], [place] [address]  Culture, blood (routine x 2)     Status: None   Collection Time: 04/13/19 11:25 AM   Specimen: BLOOD  Result Value Ref Range Status   Specimen Description BLOOD LEFT ANTECUBITAL  Final   Special Requests   Final    BOTTLES DRAWN AEROBIC ONLY Blood Culture adequate volume   Culture   Final    NO GROWTH 5 DAYS Performed at [facility], [place] [address], [place], [place] [address]    Report Status 04/18/2019 FINAL  Final  Culture, blood (routine x 2)     Status: None   Collection Time: 04/13/19 11:40 AM   Specimen: BLOOD RIGHT HAND  Result Value Ref Range Status   Specimen Description BLOOD RIGHT HAND  Final   Special Requests   Final    BOTTLES DRAWN AEROBIC ONLY Blood Culture adequate volume   Culture   Final    NO GROWTH 5 DAYS Performed at [facility], [place] [address] [place], [place] [address]    Report Status 04/18/2019 FINAL  Final    Coagulation Studies: Recent Labs    04/19/19 0556 04/20/19 0817 04/21/19 1024  LABPROT 25.2* 21.6* 26.4*  INR 2.3* 1.9* 2.4*    Urinalysis: No results for input(s): COLORURINE, LABSPEC, PHURINE, GLUCOSEU, HGBUR, BILIRUBINUR, KETONESUR, PROTEINUR, UROBILINOGEN, NITRITE, LEUKOCYTESUR in the last 72 hours.  Invalid input(s): APPERANCEUR    Imaging: No results found.   Medications:   . ferric gluconate (FERRLECIT/NULECIT) IV 125 mg (04/17/19 1611)   . ALPRAZolam  0.5 mg Oral TID  . calcitRIOL  0.25 mcg Oral Q T,Th,Sa-HD  . Chlorhexidine Gluconate Cloth  6 each Topical BID  . colestipol  4 g Oral BID  . darbepoetin (ARANESP) injection - DIALYSIS  200 mcg Intravenous Q Thu-HD  . feeding supplement (PRO-STAT SUGAR FREE 64)  30  mL Oral BID  . Gerhardt's butt cream   Topical QID  . metoprolol tartrate  12.5 mg Oral BID  . midodrine  10 mg Oral TID WC  . multivitamin  1 tablet Oral QHS  . pantoprazole  40 mg Oral BID  . polycarbophil  625 mg Oral TID WC  . pregabalin  25 mg Oral QHS  . pregabalin  25 mg Oral Once per day on Mon Wed Fri  . saccharomyces boulardii  500 mg Oral TID  . sertraline  100 mg Oral Daily  . sevelamer carbonate  1,600 mg Oral TID WC  .  Warfarin - Pharmacist Dosing Inpatient   Does not apply q1800   acetaminophen, calcium carbonate, diphenhydrAMINE, diphenoxylate-atropine, Glycerin (Adult), guaiFENesin, oxyCODONE, prochlorperazine **OR** prochlorperazine **OR** prochlorperazine, simethicone, zolpidem  Assessment/ Plan:  1.Debility:PT/OT per CIR.  2. ESRD:PD cath removed 11/16 at another facility. Continue on HD TTS schedulehere.HD yesterday tolerated well. Next HD on 12/22.K 3.5, use 3K bath.   3.Hypotension/volume:BP well controlled.on midodrine 10 mg TID. Does not appear grossly volume overloaded. Continue to titrate down volume as tolerated. Use albumin w/HD for BP support. 1L removed yesterday.   4. Anemia:Hgb stable 8.3>8.6.Aranesp 272mcg q Thurs (his usual dose)andcourseofIV iron. Hx recent GIB - stable gastric ulcer, on PPI.  5. Secondary hyperparathyroidism:Continue VDRA/binder. Calcium and phos in goal.  6. Nutrition:Alb 2 continue pro-stat supplements.Renal diet w/fluid restrictions.  7. Hx mechanical AoV, A-fib/flutter: Warfarin per pharmacy - hold heparin with HD. on metoprolol   8. T2DM - per primary  9. Hx testicular cancer  10.Leukocytosis -plateaued- does have TDCand leg wounds-blood cultures NGTD 12/13  11. Frequent bowel movements- GI consulting, given colestipol   LOS: [place] @TODAY @1 :49 PM

## 2019-04-21 NOTE — Progress Notes (Signed)
Occupational Therapy Session Note  Patient Details  Name: Calvin [name] MRN: [id number] Date of Birth: 1956/01/09  Today's Date: 04/21/2019 OT Individual Time: 6283-1517 OT Individual Time Calculation (min): 30 min    Short Term Goals: Week 2:  OT Short Term Goal 1 (Week 2): Pt will remain OOB in w/c for 45 min to increase functional activity tolerance OT Short Term Goal 2 (Week 2): Pt will complete slideboard transfer to [facility] with max +1 OT Short Term Goal 3 (Week 2): Pt will consistently get OOB 1x a day  Skilled Therapeutic Interventions/Progress Updates:    Pt received sitting up in the w/c with c/o incontinent BM and requesting to return to bed. Pt completed beasy board transfer back to bed with max A, very little initiation from pt. Pt required total +2 assistance to return to supine. Total A to change pt brief at bed level. Discussed wound care with RN and applied butt cream as instructed. Pt very resistant to remaining in sidelying as recommended by OT for pressure relief and wound healing. Increased education provided re implications of non-healing wounds and especially because pt is declining use of air mattress. Pt agreeable to pillow under his bottom to slightly offset pressure only. Pt left supine with all needs met.   Therapy Documentation Precautions:  Precautions Precautions: Fall Precaution Comments: monitor HR, L heel wound Restrictions Weight Bearing Restrictions: No  Therapy/Group: Individual Therapy  Curtis Sites 04/21/2019, 6:44 AM

## 2019-04-21 NOTE — Progress Notes (Signed)
PHYSICAL MEDICINE & REHABILITATION PROGRESS NOTE   Subjective/Complaints: Patient seen sitting up in chair this morning.  Continues to have buttock soreness. Happy with the changes to his diet. Denies constipation.   ROS: + Sore buttock, improving. Denies CP, SOB, N/V/D   Objective:   No results found. Recent Labs    04/19/19 1409  WBC 13.4*  HGB 8.6*  HCT 28.1*  PLT 681*   Recent Labs    04/19/19 1409  NA 135  K 4.0  CL 96*  CO2 23  GLUCOSE 106*  BUN 62*  CREATININE 6.70*  CALCIUM 9.5    Intake/Output Summary (Last 24 hours) at 04/21/2019 0958 Last data filed at 04/20/2019 1808 Gross per 24 hour  Intake 160 ml  Output --  Net 160 ml     Physical Exam: Vital Signs Blood pressure (!) 114/46, pulse 83, temperature 98.4 F (36.9 C), resp. rate 16, height 6\' 2"  (1.88 m), weight 134 kg, SpO2 98 %. Constitutional: No distress . Vital signs reviewed.  Morbidly obese. HENT: Normocephalic.  Atraumatic. Eyes: EOMI. No discharge. Cardiovascular: No JVD. Respiratory: Normal effort.  No stridor. GI: Non-distended. Skin: Warm and dry.  Intact. Psych: Normal mood.  Normal behavior. Musc: Lower extremity edema Neurological: Alert Motor: Bilateral upper extremities: 4/5 proximal distal Right lower extremity: 2+/5 proximal distal Left lower extremity: 2/5 proximal to distal  Assessment/Plan: 1. Functional deficits secondary to debility which require 3+ hours per day of interdisciplinary therapy in a comprehensive inpatient rehab setting.  Physiatrist is providing close team supervision and 24 hour management of active medical problems listed below.  Physiatrist and rehab team continue to assess barriers to discharge/monitor patient progress toward functional and medical goals  Care Tool:  Bathing  Bathing activity did not occur: Refused Body parts bathed by patient: Front perineal area, Buttocks         Bathing assist Assist Level: 2 Helpers      Upper Body Dressing/Undressing Upper body dressing   What is the patient wearing?: Pull over shirt    Upper body assist Assist Level: Supervision/Verbal cueing    Lower Body Dressing/Undressing Lower body dressing      What is the patient wearing?: Incontinence brief     Lower body assist Assist for lower body dressing: 2 Helpers     Toileting Toileting    Toileting assist Assist for toileting: 2 Helpers     Transfers Chair/bed transfer  Transfers assist  Chair/bed transfer activity did not occur: Safety/medical concerns  Chair/bed transfer assist level: 2 Helpers     Locomotion Ambulation   Ambulation assist   Ambulation activity did not occur: Safety/medical concerns          Walk 10 feet activity   Assist  Walk 10 feet activity did not occur: Safety/medical concerns        Walk 50 feet activity   Assist Walk 50 feet with 2 turns activity did not occur: Safety/medical concerns         Walk 150 feet activity   Assist Walk 150 feet activity did not occur: Safety/medical concerns         Walk 10 feet on uneven surface  activity   Assist Walk 10 feet on uneven surfaces activity did not occur: Safety/medical concerns         Wheelchair     Assist Will patient use wheelchair at discharge?: Yes Type of Wheelchair: Manual Wheelchair activity did not occur: Safety/medical concerns  Wheelchair assist level:  Minimal Assistance - Patient > 75% Max wheelchair distance: 5'    Wheelchair 50 feet with 2 turns activity    Assist    Wheelchair 50 feet with 2 turns activity did not occur: Safety/medical concerns       Wheelchair 150 feet activity     Assist  Wheelchair 150 feet activity did not occur: Safety/medical concerns       Blood pressure (!) 114/46, pulse 83, temperature 98.4 F (36.9 C), resp. rate 16, height 6\' 2"  (1.88 m), weight 134 kg, SpO2 98 %.  Medical Problem List and Plan: 1.  Impaired mobility  and ADLs secondary to debility   Continue CIR  2.  St Jude's Aortic Valve/Antithrombotics: -DVT/anticoagulation:  Pharmaceutical: Coumadin  INR subtherapeutic on 12/20, discussed with pharmacy, patient refused heparin gtt.  12/21: INR has not yet resulted this morning.              -antiplatelet therapy: N/A 3. Pain Management: Oxycodone prn  -resumed lyrica at hs only 25mg , added additional 25 mg postdialysis 4. Mood: LCSW to follow for evaluation and support.              -antipsychotic agents: N/A  -anxiety disorder/ recent MDD   - increased xanax back to 0.5mg ,    -continue to provide positive reinforcement. Trying to empower him to take some initiative and accountability for his care and progress  Zoloft increased to home dose of 100 mg on 12/20 5. Neuropsych: This patient is capable of making decisions on his own behalf. 6. Skin/Wound Care: Air mattress overlay for MASD due to ongoing diarrhea.    -gerhardt's cream to buttocks  -rx loose stools  -prevalon boots bilateral heels 7. Fluids/Electrolytes/Nutrition: Strict I/O. Renal diet with 1200 cc FR.  8.   ESRD: Now on HD. Schedule hemodialysis at the end of the day to help with therapy tolerance. TTS schedule   12/10: diet liberalized to help with intake  -volume/edema mgt per nephrology   [facility] Weights   04/19/19 1330 04/19/19 1744 04/20/19 0313  Weight: 131.1 kg 130 kg 134 kg    Stable on 12/20, 12/21 9. Hypotension: Monitor BP tid--continue midodrine.  Relatively controlled and asymptomatic on 12/20 10 History of A fib/A flutter: Monitor HR -on coumadin. Continue Lopressor 12.5mg  BID, will consider Toprolol XL 11. Recent GIB/Anemia of chronic disease: Continue to monitor H/H with serial checks. On Retacrit 10,000 units with HD.     hgb/tranfusion per nephrology-hemoglobin 8.6 on 12/19 12. Loose stool: C diff negative  -continue fiber  -increased probiotic to 500mg   TID  -diet--reinforced that consistent PO intake is  important also  -12/18 stopped megace given it's lack of benefit and potential for loose stool (although was having loose stool long before this was started)  Appreciate GI recs, medication initiated, await further recs 13. Cough with phlegm: Guaifenesin prn 14. Shortness of breath at rest: encourage IS, anxiety mgt.  Improved 15. Leukocytosis:  -wbc's 13.4 on 12/19  Afebrile  -bc negative no growth 16.  Morbid obesity: Encouraged weight loss  LOS: 14 days A FACE TO FACE EVALUATION WAS PERFORMED  Clide Deutscher Kimerly Rowand 04/21/2019, 9:58 AM

## 2019-04-21 NOTE — Progress Notes (Signed)
Physical Therapy Session Note  Patient Details  Name: Calvin [name] MRN: [id number] Date of Birth: 05-09-1955  Today's Date: 04/21/2019 PT Individual Time: 9179-1505 AND 6979-4801 PT Individual Time Calculation (min): 45 min AND 30 min  Short Term Goals: Week 2:  PT Short Term Goal 1 (Week 2): Pt will perform bed mobiity with mod assist PT Short Term Goal 2 (Week 2): Pt will be able to propel w/c x 50' with supervision PT Short Term Goal 3 (Week 2): Pt will be able to perform bed <> chair transfers with max +1 assist  Skilled Therapeutic Interventions/Progress Updates:   Session 1: Pt in supine and agreeable to therapy, denies pain at rest. R/L rolling w/ mod assist to don shorts in supine. Needs brief rest break after each movement 2/2 fatigue, verbal cues for pursed-lip breathing, primarily 2/2 anxiety w/ mobility. Supine>sit x2 w/ mod assist and verbal/tactile cues for technique. Sat EOB for 3-4 min w/max encouragement to push through dizziness and verbal reminders for pursed-lip breathing. Supine rest 2/2 fatigue, BP 135/55. Returned to EOB and 2nd helper present to place [name]. [name] transfer to TIS w/ max assist +2, verbal and tactile cues for head/hips relationship, LUE placement to pull himself onto chair. Total assist +1 to reposition hips to neutral position in TIS. Tilted back intermittently for pain relief on bottom 2/2 skin breakdown, while performing self-care tasks in almost full upright position. Set-up assist to shave face w/ electric razor, wash face, and brush teeth, total assist to brush hair. Pt agreeable to try sitting up for as long as possible until next appointment at 10:15am. Discussed having nursing adjust tilt angle for pressure relief before returning to bed if need be. Pt and RN in agreement. Ended session tilted in TIS, all needs in reach.   Session 2:  Pt received in supine and agreeable to therapy, denies pain. Pt agreeable to attempt standing in stedy  this session w/ encouragement. Supine>sit w/ mod assist w/ verbal and tactile cues for technique. Tolerated sitting EOB for ~7 min w/ close supervision before requesting to return to supine. Pt w/ increased dizziness that did not resolve in seated, verbal and visual cues for pursed-lip breathing and for full upright posture. Pt declined further attempts at standing 2/2 dizziness despite encouragement. Pt was then agreeable to practice supine<>sit again, performed w/ mod assist. Instructed pt in triceps exercise to work on strength needed to extend UE from sidelying to sitting w/ orange theraband tied to bed rail, performed correctly and safely w/o assist from therapist. Ended session in supine, all needs in reach.   Therapy Documentation Precautions:  Precautions Precautions: Fall Precaution Comments: monitor HR, L heel wound Restrictions Weight Bearing Restrictions: No  Therapy/Group: Individual Therapy  [name] K [name] 04/21/2019, 11:10 AM

## 2019-04-21 NOTE — Progress Notes (Signed)
Nutrition Follow-up  RD working remotely.  DOCUMENTATION CODES:   Obesity unspecified  INTERVENTION:   - Continue Pro-Stat 30 ml po BID, each supplement provides 100 kcal and 15 grams of protein  - Wife bringing in home Liquacel protein modular, continue due patient preference  - Continue double portions  - Continue renal MVI daily  - Continue liberalized Regular diet to promote PO intake  NUTRITION DIAGNOSIS:   Increased nutrient needs related to acute illness, chronic illness (peritonitis) as evidenced by estimated needs.  Ongoing, being addressed via diet liberalization and supplements  GOAL:   Patient will meet greater than or equal to 90% of their needs  Progressing  MONITOR:   PO intake, Diet advancement, Supplement acceptance, Labs, Weight trends  REASON FOR ASSESSMENT:   Other (ESRD on PD)    ASSESSMENT:   63 year old patient with recent admission 10/21-11/1 for sepsis due to peritonitis. Pt was readmitted to [facility] 11/11-11/24 for weakness, diarrhea and issues with PD catheter. PD cath was removed and pt transitioned to HD on Tuesday, Thursday and Saturday. PMH of congenital heart disease, HTN, GERD, anxiety, depression and colon polyps. Pt admitted to CIR for significant debility.  Noted pt having continued diarrhea. GI following. Pt started on colestipol.  Last HD on 12/19 with 1000 ml net UF. Post-HD weight 130 kg.  Weight down a total of 10.9 kg since admit. Will continue to monitor trends. Per RN edema assessment, pt with mild pitting generalized edema.  Meal Completion: 0-100% x last 8 recorded meals  Medications reviewed and include: calcitriol, colestipol, Aranesp, Pro-stat BID, rena-vit, protonix, Florastor, Renvela, warfarin  Labs reviewed: hemoglobin 9.0  Diet Order:   Diet Order            Diet regular Room service appropriate? Yes; Fluid consistency: Thin; Fluid restriction: 1500 mL Fluid  Diet effective now              EDUCATION NEEDS:   Not appropriate for education at this time  Skin:  Skin Assessment: Skin Integrity Issues: Stage II: right heel Unstageable: left heel Other: non-pressure wound to buttocks  Last BM:  04/21/19 type 6  Height:   Ht Readings from Last 1 Encounters:  04/18/19 6\' 2"  (1.88 m)    Weight:   Wt Readings from Last 1 Encounters:  04/20/19 134 kg    Ideal Body Weight:  86.4 kg  BMI:  Body mass index is 37.93 kg/m.  Estimated Nutritional Needs:   Kcal:  2600-2800  Protein:  150-175  Fluid:  1067ml + UOP    Gaynell Face, MS, RD, LDN Inpatient Clinical Dietitian Pager: [phone] Weekend/After Hours: [phone]

## 2019-04-21 NOTE — Progress Notes (Signed)
[facility] Gastroenterology Progress Note  Calvin [name] 63 y.o. [birth year]   Subjective: Ongoing diarrhea. Denies abdominal pain.  Objective: Vital signs: Vitals:   04/20/19 2003 04/21/19 0539  BP: 110/60 (!) 114/46  Pulse: 77 83  Resp:  16  Temp:  98.4 F (36.9 C)  SpO2:  98%    Physical Exam: Gen: lethargic, no acute distress, well-nourished HEENT: anicteric sclera CV: RRR Chest: CTA B Abd: soft, nontender, nondistended, +BS Ext: no edema  Lab Results: Recent Labs    04/19/19 1409  NA 135  K 4.0  CL 96*  CO2 23  GLUCOSE 106*  BUN 62*  CREATININE 6.70*  CALCIUM 9.5  PHOS 3.7   Recent Labs    04/19/19 1409  ALBUMIN 2.3*   Recent Labs    04/19/19 1409  WBC 13.4*  HGB 8.6*  HCT 28.1*  MCV 92.4  PLT 681*      Assessment/Plan: Chronic diarrhea with perianal excoriation. Increase Colestipol to 4 g BID. Continue Lomotil prn. Supportive care. Will follow.   Calvin [name] 04/21/2019, 10:57 AM  Questions please call [phone] ID: Calvin [name], male   DOB: August 21, 1955, 63 y.o.   MRN: [id number]

## 2019-04-21 NOTE — Progress Notes (Signed)
ANTICOAGULATION CONSULT NOTE - Follow Up Consult  Pharmacy Consult for Coumadin Indication: St Jude AVR 1990s + Afib  Patient Measurements: Height: 6\' 2"  (188 cm) Weight: 295 lb 6.7 oz (134 kg) IBW/kg (Calculated) : 82.2   Vital Signs: Temp: 97.7 F (36.5 C) (12/21 1410) BP: 105/42 (12/21 1410) Pulse Rate: 74 (12/21 1410)  Labs: Recent Labs    04/15/19 0603 04/15/19 1407 04/16/19 0504 04/17/19 1300 04/17/19 1313  HGB  --  8.4*  --   --  8.3*  HCT  --  27.2*  --   --  27.8*  PLT  --  606*  --   --  629*  LABPROT 38.9*  --  44.9* 49.6*  --   INR 4.0*  --  4.8* 5.4*  --   CREATININE  --  8.56*  --   --  6.97*    Estimated Creatinine Clearance: 16.6 mL/min (A) (by C-G formula based on SCr of 6.7 mg/dL (H)).  Assessment: 77 yom on warfarin PTA for hx St Jude AVR (1990s) + Afib (CHA2DS2-VASc = 1). Goal INR range should be 2.5-3.5 given indication; however, per discussion with patient, he was told to aim for 2.5 in mid 2019 by his MD due to history of GIB. Per chart review, appears that GIB events are related to procedures (polypectomy) and GI ulcers.  INR remains subtherapeutic but trending up.  Pt refused bridging the heparin 12/20.  PTA warfarin dose: 5 mg daily   Goal of Therapy:  INR 2.5-3 Monitor platelets by anticoagulation protocol: Yes   Plan:  Warfarin 5 mg PO x 1 dose tonight Monitor daily INR, s/sx bleeding  [employer], Pharm.D., BCPS Clinical Pharmacist Clinical phone for 04/21/2019 from 8:30-4:00 is [phone].  **Pharmacist phone directory can be found on [email or web address] listed under [place].  04/21/2019 2:43 PM

## 2019-04-22 ENCOUNTER — Inpatient Hospital Stay (HOSPITAL_COMMUNITY): Payer: Medicare Other | Admitting: Physical Therapy

## 2019-04-22 ENCOUNTER — Inpatient Hospital Stay (HOSPITAL_COMMUNITY): Payer: Medicare Other | Admitting: Occupational Therapy

## 2019-04-22 ENCOUNTER — Other Ambulatory Visit: Payer: Self-pay | Admitting: Cardiology

## 2019-04-22 LAB — RENAL FUNCTION PANEL
Albumin: 2 g/dL — ABNORMAL LOW (ref 3.5–5.0)
Anion gap: 17 — ABNORMAL HIGH (ref 5–15)
BUN: 77 mg/dL — ABNORMAL HIGH (ref 8–23)
CO2: 22 mmol/L (ref 22–32)
Calcium: 9.3 mg/dL (ref 8.9–10.3)
Chloride: 95 mmol/L — ABNORMAL LOW (ref 98–111)
Creatinine, Ser: 8.09 mg/dL — ABNORMAL HIGH (ref 0.61–1.24)
GFR calc Af Amer: 7 mL/min — ABNORMAL LOW (ref >60)
GFR calc non Af Amer: 6 mL/min — ABNORMAL LOW (ref >60)
Glucose, Bld: 109 mg/dL — ABNORMAL HIGH (ref 70–99)
Phosphorus: 3.6 mg/dL (ref 2.5–4.6)
Potassium: 3.7 mmol/L (ref 3.5–5.1)
Sodium: 134 mmol/L — ABNORMAL LOW (ref 135–145)

## 2019-04-22 LAB — CBC
HCT: 27.7 % — ABNORMAL LOW (ref 39.0–52.0)
Hemoglobin: 8.4 g/dL — ABNORMAL LOW (ref 13.0–17.0)
MCH: 27.7 pg (ref 26.0–34.0)
MCHC: 30.3 g/dL (ref 30.0–36.0)
MCV: 91.4 fL (ref 80.0–100.0)
Platelets: 617 10*3/uL — ABNORMAL HIGH (ref 150–400)
RBC: 3.03 MIL/uL — ABNORMAL LOW (ref 4.22–5.81)
RDW: 17.3 % — ABNORMAL HIGH (ref 11.5–15.5)
WBC: 14.3 10*3/uL — ABNORMAL HIGH (ref 4.0–10.5)
nRBC: 0 % (ref 0.0–0.2)

## 2019-04-22 LAB — PROTIME-INR
INR: 2.8 — ABNORMAL HIGH (ref 0.8–1.2)
Prothrombin Time: 29 seconds — ABNORMAL HIGH (ref 11.4–15.2)

## 2019-04-22 LAB — HEPATITIS B SURFACE ANTIGEN: Hepatitis B Surface Ag: NONREACTIVE

## 2019-04-22 MED ORDER — LIDOCAINE HCL (PF) 1 % IJ SOLN: 5.0000 mL | INTRAMUSCULAR | Status: DC | PRN

## 2019-04-22 MED ORDER — SODIUM CHLORIDE 0.9 % IV SOLN: 100.0000 mL | INTRAVENOUS | Status: DC | PRN

## 2019-04-22 MED ORDER — HEPARIN SODIUM (PORCINE) 1000 UNIT/ML DIALYSIS: 1000.0000 [IU] | INTRAMUSCULAR | Status: DC | PRN

## 2019-04-22 MED ORDER — HEPARIN SODIUM (PORCINE) 1000 UNIT/ML IJ SOLN
INTRAMUSCULAR | Status: AC
  Administered 2019-04-22: 1000 [IU] via INTRAVENOUS_CENTRAL
  Filled 2019-04-22: qty 5

## 2019-04-22 MED ORDER — PENTAFLUOROPROP-TETRAFLUOROETH EX AERO: 1.0000 "application " | INHALATION_SPRAY | CUTANEOUS | Status: DC | PRN

## 2019-04-22 MED ORDER — LIDOCAINE-PRILOCAINE 2.5-2.5 % EX CREA: 1.0000 "application " | TOPICAL_CREAM | CUTANEOUS | Status: DC | PRN

## 2019-04-22 MED ORDER — ALTEPLASE 2 MG IJ SOLR: 2.0000 mg | Freq: Once | INTRAMUSCULAR | Status: DC | PRN

## 2019-04-22 MED ORDER — WARFARIN SODIUM 3 MG PO TABS
3.0000 mg | ORAL_TABLET | Freq: Once | ORAL | Status: AC
  Administered 2019-04-22: 3 mg via ORAL
  Filled 2019-04-22: qty 1

## 2019-04-22 NOTE — Progress Notes (Signed)
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  Michela Pitcher he is status post Environmental health practitioner AVR history of atrial fibrillation/flutter history of obesity obesity GERD testicular cancer type 2 diabetes chronic pain and is end-stage renal disease.  He was on peritoneal dialysis and was admitted 02/19/2019 to 03/02/2019 with sepsis from peritonitis and a Mallory-Weiss tear and a gastric ulcer.  He was readmitted to [facility] 03/12/2019 until 03/25/2019 with weakness diarrhea persistent leukocytosis and problems with PD catheter.  He was started on vancomycin and meropenem and the PD catheter was removed.  A PermCath was placed 03/17/2019 and transition to hemodialysis.  His hospital course was complicated by hypotension during dialysis and large volume diarrhea requiring rectal tube with deep tissue injury to the sacrum.  He was also anemic and required transfusions.  He was discharged with IV antibiotics to skilled nursing facility.  He continued on hemodialysis on a TTS schedule with midodrine for blood pressure support.  The rectal tube was removed 03/31/2019.  For his chronic diarrhea with perianal excoriation he was placed on colestipol 4 g twice daily and continued on Lomotil.  I appreciate his support and help from Dr. Michail Sermon.  He appears to be tolerating dialysis well and received 1 L ultrafiltration 04/19/2019  Blood pressure 104/52 pulse 81 temperature 97.6 O2 sats 97%  Sodium 135 potassium 4 chloride 96 CO2 23 BUN 60 creatinine 6.7 glucose 106 calcium 9.5 phosphorus 3.7 albumin 2.3 WBC 14.1 hemoglobin 9 platelets 645 INR 2.8  Xanax 0.5 mg 3 times daily, colestipol 4 g twice daily, darbepoetin 200 mcg q. Thursday, metoprolol 12.5 mg twice daily, midodrine 10 mg 3 times daily, Protonix 40 mg daily Lyrica 25 mg daily, Zoloft 100 mg daily Renvela 1.6 g with meals:  Warfarin.  INR   Objective:  Vital signs in last 24 hours:  Temp:  [97.6 F (36.4 C)-97.7 F (36.5 C)] 97.6 F (36.4 C) (12/22 0534) Pulse Rate:  [74-90] 81 (12/22 0534) Resp:  [16-18] 18 (12/22 0534) BP: (104-125)/(39-52) 104/52 (12/22 0534) SpO2:  [97 %-100 %] 97 % (12/22 0534)  Weight change:  Filed Weights   04/19/19 1330 04/19/19 1744 04/20/19 0313  Weight: 131.1 kg 130 kg 134 kg    Intake/Output: I/O last 3 completed shifts: In: 150 [P.O.:150] Out: -    Intake/Output this shift:  No intake/output data recorded.  General:NAD, chronically ill appearing male, laying in bed Heart:RRR, +valve click Lungs:CTAB Abdomen:soft, NTND Extremities:trace LE edema Dialysis Access: Retta Diones [facility]    Basic Metabolic Panel: Recent Labs  Lab 04/15/19 1407 04/17/19 0723 04/17/19 1313 04/19/19 1409  NA 136  --  138 135  K 3.7  --  3.5 4.0  CL 95*  --  98 96*  CO2 24  --  24 23  GLUCOSE 118*  --  116* 106*  BUN 74*  --  63* 62*  CREATININE 8.56*  --  6.97* 6.70*  CALCIUM 8.7*  --  9.0 9.5  MG  --  1.9  --   --   PHOS 5.1*  --  5.3* 3.7    Liver Function Tests: Recent Labs  Lab 04/15/19 1407 04/17/19 1313 04/19/19 1409  ALBUMIN 2.0* 2.0* 2.3*   No results for input(s): LIPASE, AMYLASE in the last 168 hours. No results for input(s): AMMONIA in the last 168 hours.  CBC: Recent Labs  Lab 04/15/19 1407 04/17/19 1313 04/19/19 1409 04/21/19 1024  WBC 15.9* 13.9* 13.4* 14.1*  HGB 8.4* 8.3* 8.6* 9.0*  HCT 27.2* 27.8* 28.1* 29.5*  MCV 93.2 93.6 92.4 91.9  PLT 606* 629* 681* 645*    Cardiac Enzymes: No results for input(s): CKTOTAL, CKMB, CKMBINDEX, TROPONINI in the last 168 hours.  BNP: Invalid input(s): POCBNP  CBG: No results for input(s): GLUCAP in the last 168 hours.  Microbiology: Results for orders placed or performed during the hospital encounter of 05/01/2019  C difficile quick scan w PCR reflex     Status: None   Collection Time: 04/11/2019 10:59 PM    Specimen: STOOL  Result Value Ref Range Status   C Diff antigen NEGATIVE NEGATIVE Final   C Diff toxin NEGATIVE NEGATIVE Final   C Diff interpretation No C. difficile detected.  Final    Comment: Performed at [facility], [place] [address], [place], [place] [address]  Culture, blood (routine x 2)     Status: None   Collection Time: 04/13/19 11:25 AM   Specimen: BLOOD  Result Value Ref Range Status   Specimen Description BLOOD LEFT ANTECUBITAL  Final   Special Requests   Final    BOTTLES DRAWN AEROBIC ONLY Blood Culture adequate volume   Culture   Final    NO GROWTH 5 DAYS Performed at [facility], [place] [address] [place], [place] [address]    Report Status 04/18/2019 FINAL  Final  Culture, blood (routine x 2)     Status: None   Collection Time: 04/13/19 11:40 AM   Specimen: BLOOD RIGHT HAND  Result Value Ref Range Status   Specimen Description BLOOD RIGHT HAND  Final   Special Requests   Final    BOTTLES DRAWN AEROBIC ONLY Blood Culture adequate volume   Culture   Final    NO GROWTH 5 DAYS Performed at [facility], [place] [address], [place], [place] [address]    Report Status 04/18/2019 FINAL  Final    Coagulation Studies: Recent Labs    04/20/19 0817 04/21/19 1024 04/22/19 0529  LABPROT 21.6* 26.4* 29.0*  INR 1.9* 2.4* 2.8*    Urinalysis: No results for input(s): COLORURINE, LABSPEC, PHURINE, GLUCOSEU, HGBUR, BILIRUBINUR, KETONESUR, PROTEINUR, UROBILINOGEN, NITRITE, LEUKOCYTESUR in the last 72 hours.  Invalid input(s): APPERANCEUR    Imaging: No results found.   Medications:   . ferric gluconate (FERRLECIT/NULECIT) IV 125 mg (04/17/19 1611)   . ALPRAZolam  0.5 mg Oral TID  . calcitRIOL  0.25 mcg Oral Q T,Th,Sa-HD  . Chlorhexidine Gluconate Cloth  6 each Topical BID  . Chlorhexidine Gluconate Cloth  6 each Topical Q0600  . colestipol  4 g Oral BID  . darbepoetin (ARANESP) injection - DIALYSIS  200 mcg Intravenous Q Thu-HD  . feeding  supplement (PRO-STAT SUGAR FREE 64)  30 mL Oral BID  . Gerhardt's butt cream   Topical QID  . metoprolol tartrate  12.5 mg Oral BID  . midodrine  10 mg Oral TID WC  . multivitamin  1 tablet Oral QHS  . pantoprazole  40 mg Oral BID  . polycarbophil  625 mg Oral TID WC  . pregabalin  25 mg Oral QHS  . [START ON 04/24/2019] pregabalin  25 mg Oral Q T,Th,Sa-HD  . saccharomyces boulardii  500 mg Oral TID  . sertraline  100 mg Oral Daily  . sevelamer carbonate  1,600 mg Oral TID WC  .  warfarin  3 mg Oral ONCE-1800  . Warfarin - Pharmacist Dosing Inpatient   Does not apply q1800   acetaminophen, calcium carbonate, diphenhydrAMINE, diphenoxylate-atropine, Glycerin (Adult), guaiFENesin, oxyCODONE, prochlorperazine **OR** prochlorperazine **OR** prochlorperazine, simethicone, zolpidem  Assessment/ Plan:  1.Debility:PT/OT per CIR.  2. ESRD:PD cath removed 11/16 at another facility. Continue on HD TTS schedulehere.HD yesterday tolerated well.  Dialysis planned for 04/22/2019 potassium appears to be improved at 4.0 will change to 2K bath  3.Hypotension/volume:BP well controlled.on midodrine 10 mg TID. Does not appear grossly volume overloaded. Continue to titrate down volume as tolerated. Use albumin w/HD for BP support. 1L removed yesterday.   4. Anemia:Hgb stable 8.3>8.6.Aranesp 243mcg q Thurs (his usual dose)andcourseofIV iron. Hx recent GIB - stable gastric ulcer, on PPI.  5. Secondary hyperparathyroidism:Continue VDRA/binder. Calcium and phos in goal.  6. Nutrition:Alb 2 continue pro-stat supplements.Renal diet w/fluid restrictions.  7. Hx mechanical AoV, A-fib/flutter: Warfarin per pharmacy - hold heparin with HD. on metoprolol   8. T2DM - per primary  9. Hx testicular cancer  10.Leukocytosis -plateaued- does have TDCand leg wounds-blood cultures NGTD 12/13  11. Frequent bowel movements- GI consulting, given colestipol   LOS: [place] @TODAY @11 :00 AM

## 2019-04-22 NOTE — Progress Notes (Signed)
[facility] Gastroenterology Progress Note  Calvin [name] 63 y.o. [birth year]   Subjective: In bedside chair. Feels that BMs have some form to them but still frequent. Denies abdominal pain.  Objective: Vital signs: Vitals:   04/21/19 1944 04/22/19 0534  BP: (!) 119/47 (!) 104/52  Pulse:  81  Resp:  18  Temp:  97.6 F (36.4 C)  SpO2:  97%    Physical Exam: Gen: lethargic, no acute distress, obese HEENT: anicteric sclera CV: RRR Chest: CTA B Abd: soft, nontender, nondistended, +BS  Lab Results: Recent Labs    04/19/19 1409  NA 135  K 4.0  CL 96*  CO2 23  GLUCOSE 106*  BUN 62*  CREATININE 6.70*  CALCIUM 9.5  PHOS 3.7   Recent Labs    04/19/19 1409  ALBUMIN 2.3*   Recent Labs    04/19/19 1409 04/21/19 1024  WBC 13.4* 14.1*  HGB 8.6* 9.0*  HCT 28.1* 29.5*  MCV 92.4 91.9  PLT 681* 645*      Assessment/Plan: Chronic diarrhea with some improvement on higher dose of Colestipol 4 g BID (first dose of higher dose given yesterday evening). Continue on this dose and follow. Continue Lomotil prn. Supportive care. Will follow.   Calvin [name] 04/22/2019, 10:43 AM  Questions please call [phone] ID: Calvin [name], male   DOB: May 21, 1955, 63 y.o.   MRN: [id number]

## 2019-04-22 NOTE — Patient Care Conference (Signed)
Inpatient RehabilitationTeam Conference and Plan of Care Update Date: 04/22/2019   Time: 10:00 AM    Patient Name: Calvin [name]      Medical Record Number: [id number]  Date of Birth: [birth year] Sex: Male         Room/Bed: 4W11C/4W11C-01 Payor Info: Payor: MEDICARE / Plan: MEDICARE PART A AND B / Product Type: *No Product type* /    Admit Date/Time:  04/24/2019  2:47 PM  Primary Diagnosis:  Physical debility  Patient Active Problem List   Diagnosis Date Noted  . Hemodialysis-associated hypotension   . Subtherapeutic international normalized ratio (INR)   . Morbid obesity ([place])   . ESRD on dialysis ([place])   . Anemia of chronic disease   . Rectal pain   . Leukocytosis   . Major depressive disorder   . Pressure injury of skin 04/17/2019  . Generalized anxiety disorder   . SOB (shortness of breath)   . Physical debility 04/27/2019  . Complex coarctation of the aorta   . Hepatitis C   . AVD (aortic valve disease)   . Long term (current) use of anticoagulants 12/12/2013  . Chronic anticoagulation 07/10/2012  . Gastric ulcer 07/04/2012  . Gout 07/03/2012  . Bilateral lower extremity edema 07/03/2012  . CKD (chronic kidney disease) stage 3, GFR 30-59 ml/min 07/02/2012  . Knee pain 12/03/2011  . Anemia of renal disease 10/04/2011  . Hyperlipidemia 04/23/2011  . 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 S/P aortic valve replacement 04/05/2011  . Thoracic aortic aneurysm ([place]) 04/05/2011  . Atrial fibrillation ([place]) 04/05/2011  . History of atrial flutter s/p DCCV 04/05/2011  . Coarctation of aorta (previous repair and stent) 04/05/2011  . Hypertension 04/05/2011  . CAD (coronary artery disease) 04/05/2011  . 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 Anemia, iron deficiency 04/03/2011    Expected Discharge Date: Expected Discharge Date: 04/29/19 (Note: DC plan now changed to SNF so DC date may not be 04/29/19)  Team Members Present: Physician leading conference: Dr. Leeroy Cha Social Worker  Present: Lennart Pall, LCSW Nurse Present: Mohammed Kindle, RN Case Manager: Karene Fry, RN PT Present: Burnard Bunting, PT OT Present: Elisabeth Most, OT SLP Present: Jettie Booze, CF-SLP PPS Coordinator present : Gunnar Fusi, SLP     Current Status/Progress Goal Weekly Team Focus  Bowel/Bladder   Incontinent of B/B  Regain continence  Q2 hrs/prn toileting   Swallow/Nutrition/ Hydration             ADL's   Still limited by diarrhea and poor OOB tolerance. ADLs max-total A +2. Transfers max +2 slideboard  mod-max A overall  ADL transfers, ADL retraining, OOB tolerance, skin integrity, d/c planning   Mobility   mod assist bed mobility, max assist +2 beasy board transfer, tolerating OOB 1-1.5 hrs/day in TIS  planning to downgrade goals to mod assist transfers and d/c gait goal  OOB tolerance, global strengthening and endurance, d/c planning, all functional mobility   Communication             Safety/Cognition/ Behavioral Observations            Pain   denies pain  remain free of pain  QS/PRN assessment /evaluatiioon   Skin              Rehab Goals Patient on target to meet rehab goals: No Rehab Goals Revised: Making few gains and continues to need max encouragement to participate. *See Care Plan and progress notes for long and short-term goals.  Barriers to Discharge  Current Status/Progress Possible Resolutions Date Resolved   Nursing                  PT  Medical stability;Incontinence;Wound Care;Behavior;Other (comments)  Pt needs to get to OP dialysis 3x/week, do not expect pt will tolerate sitting up in recliner at dialysis for 3-4 hrs at a time, let alone transfer into car. Will need to problem solve this if he does go home. Pt also anxious w/ all mobility, wife remains very involved and supportive.              OT Hemodialysis;Incontinence;Home Copywriter, advertising                SLP                SW                Discharge Planning/Teaching Needs:  Home with  family/ wife to provide 24/7 assistance vs SNF due to limited progress  Teaching needs TBD   Team Discussion: MD says to give lomotil today for loose stools.  RN skin breakdown on bottom, incontinent, using cream on bottom.  PT max +1-2 transfers, mod A bed, DC gait goal, downgrade goals to mod A, needs max encouragement to participate.  OT max to total A for self care at bed level.  SW - will change plan to SNF, a barrier will be HD.   Revisions to Treatment Plan: N/A     Medical Summary Current Status: Buttock soreness, diarrhea, flat affect Weekly Focus/Goal: Better management of diarrhea which will also maximize skin integrity of buttocks  Barriers to Discharge: Behavior;Other (comments)  Barriers to Discharge Comments: Diarrhea, wife's anxiety Possible Resolutions to Barriers: More regular use of Lomotil, frequent communication with family   Continued Need for Acute Rehabilitation Level of Care: The patient requires daily medical management by a physician with specialized training in physical medicine and rehabilitation for the following reasons: Direction of a multidisciplinary physical rehabilitation program to maximize functional independence : Yes Medical management of patient stability for increased activity during participation in an intensive rehabilitation regime.: Yes Analysis of laboratory values and/or radiology reports with any subsequent need for medication adjustment and/or medical intervention. : Yes   I attest that I was present, lead the team conference, and concur with the assessment and plan of the team.   Retta Diones 04/23/2019, 11:56 AM  Team conference was held via web/ teleconference due to [place] - 19

## 2019-04-22 NOTE — Progress Notes (Signed)
Physical Therapy Weekly Progress Note  Patient Details  Name: Calvin [name] MRN: [id number] Date of Birth: March 03, 1956  Beginning of progress report period: April 15, 2019 End of progress report period: April 22, 2019  Today's Date: 04/22/2019 PT Individual Time: 0810-0905 PT Individual Time Calculation (min): 55 min   Patient has met 2 of 3 short term goals.  Pt continues to make minimal progress towards LTGs 2/2 anxiety w/ all mobility, poor OOB tolerance, and maceration wounds on buttocks. He is performing bed mobility w/ mod assist, transfers w/ max assist +1 to +2 (depending on fatigue) w/ beasy board, and is tolerating sitting in TIS 1-2 hrs/day outside of therapy. Standing remains non-functional at this time, although will continue to work towards standing for transfers to OP dialysis recliner. Transferring to/from car and to/from dialysis recliner will also remain a major barrier to discharge home. As a result, would recommend SNF discharge at this time.   Patient continues to demonstrate the following deficits muscle weakness and muscle joint tightness, decreased cardiorespiratoy endurance and decreased sitting balance and decreased balance strategies and therefore will continue to benefit from skilled PT intervention to increase functional independence with mobility.  Patient not progressing toward long term goals.  See goal revision..  Plan of care revisions: goals downgraded to mod assist for transfers, gait goal discontinued, and car transfer goal discontinued. See note above about SNF recommendation.   PT Short Term Goals Week 2:  PT Short Term Goal 1 (Week 2): Pt will perform bed mobility with mod assist PT Short Term Goal 1 - Progress (Week 2): Met PT Short Term Goal 2 (Week 2): Pt will be able to propel w/c x 50' with supervision PT Short Term Goal 2 - Progress (Week 2): Progressing toward goal PT Short Term Goal 3 (Week 2): Pt will be able to perform bed<>chair transfers  with max +1 assist PT Short Term Goal 3 - Progress (Week 2): Met Week 3:  PT Short Term Goal 1 (Week 3): Pt will perform sit<>stand w/ max assist +1 PT Short Term Goal 2 (Week 3): Pt will tolerate sitting up and OOB x3 hrs/day PT Short Term Goal 3 (Week 3): Pt will self-propel manual w/c x 25' w/ supervision PT Short Term Goal 4 (Week 3): Pt will maintain upright sitting at EOB w/ supervision x10 min consistently  Skilled Therapeutic Interventions/Progress Updates:   Pt in supine and agreeable to therapy, denies pain. Therapist reminding pt of agreed upon goal of attempting stance today. Pt however requesting to perform upper body bathing/dressing and grooming in chair at sink this morning. Pt agreeable to attempt standing during OT session if he can do this now. R/L rolling w/ mod assist for pericare, brief management, and to don shorts; pt had been incontinent of bowel. Supine>sit from elevated supine and use of bedrails w/ min-mod assist. Pt very excited about this. Static sitting at EOB w/ close supervision for 1-2 min. [name] transfer to TIS w/ max assist +2. 2nd helper hands-on for safety, however did not provide physical assist. Verbal, tactile, and manual cues from therapist for pt to increase anterior trunk lean, for head/hips relationship, and for UE placement. Once in TIS, tolerated sitting full upright to perform upper body bathing/dressing w/ min-mod assist overall. Grooming tasks performed while tilted for bottom pressure relief including shaving w/ electric razor, washing face, and brushing hair. Pt needed max encouragement to stay up in chair at least for another 30 min, as pt c/o  bottom pain. Pain decreases once tilted more in TIS and pt agreeable to remain in chair at end of session. Ended session in TIS, all needs in reach.   Addendum: OT reported to therapist that pt remained in chair until 10:30 for OT session, 2 hours in total.   Therapy Documentation Precautions:   Precautions Precautions: Fall Precaution Comments: monitor HR, L heel wound Restrictions Weight Bearing Restrictions: No  Therapy/Group: Individual Therapy  Jaiya Mooradian Clent Demark 04/22/2019, 12:52 PM

## 2019-04-22 NOTE — Progress Notes (Signed)
ANTICOAGULATION CONSULT NOTE - Follow Up Consult  Pharmacy Consult for Warfarin Indication: atrial fibrillation and St. Jude AVR (1990s)  Allergies  Allergen Reactions  . Ace Inhibitors Swelling, Anxiety and Other (See Comments)    Reaction to Altace (ramipril) "Can tolerate Benicar".  Diffuse swelling Edema Elevated kidney functions      . Methoxy Polyethylene Glycol-Epoetin Beta Other (See Comments)    Not effective; low hbg made patient feel awful( lethargic)    . Ramipril Other (See Comments)    Elevated kidney functions   . Tape Rash    Prefers clear tape     Patient Measurements: Height: 6\' 2"  (188 cm) Weight: 295 lb 6.7 oz (134 kg) IBW/kg (Calculated) : 82.2  Vital Signs: Temp: 97.6 F (36.4 C) (12/22 0534) Temp Source: Oral (12/22 0534) BP: 104/52 (12/22 0534) Pulse Rate: 81 (12/22 0534)  Labs: Recent Labs    04/19/19 1409 04/20/19 0817 04/21/19 1024 04/22/19 0529  HGB 8.6*  --  9.0*  --   HCT 28.1*  --  29.5*  --   PLT 681*  --  645*  --   LABPROT  --  21.6* 26.4* 29.0*  INR  --  1.9* 2.4* 2.8*  CREATININE 6.70*  --   --   --     Estimated Creatinine Clearance: 16.6 mL/min (A) (by C-G formula based on SCr of 6.7 mg/dL (H)).   Medications:  Scheduled:  . ALPRAZolam  0.5 mg Oral TID  . calcitRIOL  0.25 mcg Oral Q T,Th,Sa-HD  . Chlorhexidine Gluconate Cloth  6 each Topical BID  . Chlorhexidine Gluconate Cloth  6 each Topical Q0600  . colestipol  4 g Oral BID  . darbepoetin (ARANESP) injection - DIALYSIS  200 mcg Intravenous Q Thu-HD  . feeding supplement (PRO-STAT SUGAR FREE 64)  30 mL Oral BID  . Gerhardt's butt cream   Topical QID  . metoprolol tartrate  12.5 mg Oral BID  . midodrine  10 mg Oral TID WC  . multivitamin  1 tablet Oral QHS  . pantoprazole  40 mg Oral BID  . polycarbophil  625 mg Oral TID WC  . pregabalin  25 mg Oral QHS  . [START ON 04/24/2019] pregabalin  25 mg Oral Q T,Th,Sa-HD  . saccharomyces boulardii  500 mg Oral  TID  . sertraline  100 mg Oral Daily  . sevelamer carbonate  1,600 mg Oral TID WC  . Warfarin - Pharmacist Dosing Inpatient   Does not apply q1800    Assessment: 63 yo M on warfarin PTA for hx St Jude AVR (1990s) + Afib (CHA2DS2-VASc = 1). Goal INR range should be 2.5-3.5 given indication; however, per discussion with patient, he was told to aim for 2.5 in mid 2019 by his MD due to history of GIB. Per chart review, appears that GIB events are related to procedures (polypectomy) and GI ulcers.  INR today is therapeutic. Will reduce dose given narrow goal of INR 2.5-3.  PTA warfarin dose: 5 mg daily   Goal of Therapy:  INR 2.5-3 Monitor platelets by anticoagulation protocol: Yes   Plan:  Warfarin 3mg  PO x 1 tonight Daily INR  [employer], Pharm.D., BCPS Clinical Pharmacist Clinical phone for 04/22/2019 from 8:30-4:00 is [phone].  **Pharmacist phone directory can be found on [email or web address] listed under [place].  04/22/2019 8:52 AM

## 2019-04-22 NOTE — Plan of Care (Signed)
  Problem: RH Bed to Chair Transfers Goal: LTG Patient will perform bed/chair transfers w/assist (PT) Description: LTG: Patient will perform bed to chair transfers with assistance (PT). Flowsheets (Taken 04/22/2019 1522) LTG: Pt will perform Bed to Chair Transfers with assistance level: (downgraded 12/22 due to lack of progress - AAT) Moderate Assistance - Patient 50 - 74% Note: downgraded 12/22 due to lack of progress - AAT   Problem: RH Car Transfers Goal: LTG Patient will perform car transfers with assist (PT) Description: LTG: Patient will perform car transfers with assistance (PT). Outcome: Not Applicable Flowsheets (Taken 04/22/2019 1522) LTG: Pt will perform car transfers with assist:: (discontinued 12/22 due to lack of progress - AAT) -- Note: discontinued 12/22 due to lack of progress - AAT   Problem: RH Furniture Transfers Goal: LTG Patient will perform furniture transfers w/assist (OT/PT) Description: LTG: Patient will perform furniture transfers  with assistance (OT/PT). Flowsheets (Taken 04/22/2019 1522) LTG: Pt will perform furniture transfers with assist:: (downgraded 12/22 due to lack of progress - AAT) Moderate Assistance - Patient 50 - 74% Note: downgraded 12/22 due to lack of progress - AAT   Problem: RH Ambulation Goal: LTG Patient will ambulate in controlled environment (PT) Description: LTG: Patient will ambulate in a controlled environment, # of feet with assistance (PT). Outcome: Not Applicable Flowsheets (Taken 04/22/2019 1522) LTG: Pt will ambulate in controlled environ  assist needed:: (discontinued 12/22 due to lack of progress - AAT) -- Note: discontinued 12/22 due to lack of progress - AAT

## 2019-04-22 NOTE — Progress Notes (Signed)
[place] PHYSICAL MEDICINE & REHABILITATION PROGRESS NOTE   Subjective/Complaints: Patient seen sitting up in chair this morning.  Continues to have buttock soreness. Happy with the changes to his diet. Denies constipation. Continues to have frequent BM, 6 this morning, loose but not foul-smelling. Anti-diarrheal has been increased in dose.   ROS: + Sore buttock, improving. Denies CP, SOB, N/V/D   Objective:   No results found. Recent Labs    04/19/19 1409 04/21/19 1024  WBC 13.4* 14.1*  HGB 8.6* 9.0*  HCT 28.1* 29.5*  PLT 681* 645*   Recent Labs    04/19/19 1409  NA 135  K 4.0  CL 96*  CO2 23  GLUCOSE 106*  BUN 62*  CREATININE 6.70*  CALCIUM 9.5    Intake/Output Summary (Last 24 hours) at 04/22/2019 0929 Last data filed at 04/21/2019 1900 Gross per 24 hour  Intake 30 ml  Output --  Net 30 ml     Physical Exam: Vital Signs Blood pressure (!) 104/52, pulse 81, temperature 97.6 F (36.4 C), temperature source Oral, resp. rate 18, height 6\' 2"  (1.88 m), weight 134 kg, SpO2 97 %. Constitutional: No distress . Vital signs reviewed.  Morbidly obese. Sitting up in chair.  HENT: Normocephalic.  Atraumatic. Eyes: EOMI. No discharge. Cardiovascular: No JVD. Respiratory: Normal effort.  No stridor. GI: Non-distended. Skin: Warm and dry.  Intact. Psych: Flat affect. Musc: Lower extremity edema Neurological: Alert Motor: Bilateral upper extremities: 4/5 proximal distal Right lower extremity: 2+/5 proximal distal Left lower extremity: 2/5 proximal to distal  Assessment/Plan: 1. Functional deficits secondary to debility which require 3+ hours per day of interdisciplinary therapy in a comprehensive inpatient rehab setting.  Physiatrist is providing close team supervision and 24 hour management of active medical problems listed below.  Physiatrist and rehab team continue to assess barriers to discharge/monitor patient progress toward functional and medical  goals  Care Tool:  Bathing  Bathing activity did not occur: Refused Body parts bathed by patient: Front perineal area, Buttocks         Bathing assist Assist Level: 2 Helpers     Upper Body Dressing/Undressing Upper body dressing   What is the patient wearing?: Pull over shirt    Upper body assist Assist Level: Supervision/Verbal cueing    Lower Body Dressing/Undressing Lower body dressing      What is the patient wearing?: Incontinence brief     Lower body assist Assist for lower body dressing: 2 Helpers     Toileting Toileting    Toileting assist Assist for toileting: 2 Helpers     Transfers Chair/bed transfer  Transfers assist  Chair/bed transfer activity did not occur: Safety/medical concerns  Chair/bed transfer assist level: 2 Helpers     Locomotion Ambulation   Ambulation assist   Ambulation activity did not occur: Safety/medical concerns          Walk 10 feet activity   Assist  Walk 10 feet activity did not occur: Safety/medical concerns        Walk 50 feet activity   Assist Walk 50 feet with 2 turns activity did not occur: Safety/medical concerns         Walk 150 feet activity   Assist Walk 150 feet activity did not occur: Safety/medical concerns         Walk 10 feet on uneven surface  activity   Assist Walk 10 feet on uneven surfaces activity did not occur: Safety/medical concerns  Wheelchair     Assist Will patient use wheelchair at discharge?: Yes Type of Wheelchair: Manual Wheelchair activity did not occur: Safety/medical concerns  Wheelchair assist level: Minimal Assistance - Patient > 75% Max wheelchair distance: 5'    Wheelchair 50 feet with 2 turns activity    Assist    Wheelchair 50 feet with 2 turns activity did not occur: Safety/medical concerns       Wheelchair 150 feet activity     Assist  Wheelchair 150 feet activity did not occur: Safety/medical concerns        Blood pressure (!) 104/52, pulse 81, temperature 97.6 F (36.4 C), temperature source Oral, resp. rate 18, height 6\' 2"  (1.88 m), weight 134 kg, SpO2 97 %.  Medical Problem List and Plan: 1.  Impaired mobility and ADLs secondary to debility   Continue CIR  2.  St Jude's Aortic Valve/Antithrombotics: -DVT/anticoagulation:  Pharmaceutical: Coumadin  INR subtherapeutic on 12/20, discussed with pharmacy, patient refused heparin gtt.  12/21: INR 2.4  12/22: INR 2.8             -antiplatelet therapy: N/A 3. Pain Management: Oxycodone prn  -resumed lyrica at hs only 25mg , added additional 25 mg postdialysis 4. Mood: LCSW to follow for evaluation and support.              -antipsychotic agents: N/A  -anxiety disorder/ recent MDD   - increased xanax back to 0.5mg ,    -continue to provide positive reinforcement. Trying to empower him to take some initiative and accountability for his care and progress  Zoloft increased to home dose of 100 mg on 12/20 5. Neuropsych: This patient is capable of making decisions on his own behalf. 6. Skin/Wound Care: Air mattress overlay for MASD due to ongoing diarrhea.    -gerhardt's cream to buttocks  -rx loose stools  -prevalon boots bilateral heels 7. Fluids/Electrolytes/Nutrition: Strict I/O. Renal diet with 1200 cc FR.  8.   ESRD: Now on HD. Schedule hemodialysis at the end of the day to help with therapy tolerance. TTS schedule   12/10: diet liberalized to help with intake  -volume/edema mgt per nephrology   [facility] Weights   04/19/19 1330 04/19/19 1744 04/20/19 0313  Weight: 131.1 kg 130 kg 134 kg    Stable on 12/20, 12/21 9. Hypotension: Monitor BP tid--continue midodrine.  Relatively controlled and asymptomatic on 12/20 10 History of A fib/A flutter: Monitor HR -on coumadin. Continue Lopressor 12.5mg  BID, will consider Toprolol XL 11. Recent GIB/Anemia of chronic disease: Continue to monitor H/H with serial checks. On Retacrit 10,000 units with  HD.     hgb/tranfusion per nephrology-hemoglobin 8.6 on 12/19 12. Loose stool: C diff negative  -continue fiber  -increased probiotic to 500mg   TID  -diet--reinforced that consistent PO intake is important also  -12/18 stopped megace given it's lack of benefit and potential for loose stool (although was having loose stool long before this was started)  Appreciate GI recs, medication initiated, await further recs  12/22: Lomotil prn is ordered but has not been administered; will place general nursing order to administer.  13. Cough with phlegm: Guaifenesin prn 14. Shortness of breath at rest: encourage IS, anxiety mgt.  Improved 15. Leukocytosis:  -wbc's 13.4 on 12/19  Afebrile  -bc negative no growth 16.  Morbid obesity: Encouraged weight loss  LOS: 15 days A FACE TO FACE EVALUATION WAS PERFORMED  Calvin [name] 04/22/2019, 9:29 AM

## 2019-04-22 NOTE — Progress Notes (Signed)
Occupational Therapy Session Note  Patient Details  Name: Calvin [name] MRN: [id number] Date of Birth: 03-Jun-1955  Today's Date: 04/22/2019 OT Individual Time: 1030-1130 OT Individual Time Calculation (min): 60 min    Short Term Goals: Week 2:  OT Short Term Goal 1 (Week 2): Pt will remain OOB in w/c for 45 min to increase functional activity tolerance OT Short Term Goal 2 (Week 2): Pt will complete slideboard transfer to [facility] with max +1 OT Short Term Goal 3 (Week 2): Pt will consistently get OOB 1x a day  Skilled Therapeutic Interventions/Progress Updates:    Patient seated in TIS w/c reclined - states that he needs to change position.  Reviewed plan for therapy session to include attempt to stand in stedy - patient states that he would like to try from the w/c.  Attempted twice to stand in stedy (trial 1 - patient unable to initiate any lift, after rest he attempted again - able to get 2/3 of the way to stance with max A of 2 but not high enough to put seat of stedy in place.)  He returned to seated position in w/c.  Completed beasy board transfer w/c to bed max A x2.  Max A for SSP to supine.  Completed rolling in bed mod A in each direction for change of brief and LB dressing (max A level)  Completed UB conditioning exercises with rest breaks between.  He requires time to process mobility tasks.  He remained in bed at close of session, bed alarm set and call bell in reach.    Therapy Documentation Precautions:  Precautions Precautions: Fall Precaution Comments: monitor HR, L heel wound Restrictions Weight Bearing Restrictions: No General:   Vital Signs:  Pain: Pain Assessment Pain Scale: 0-10 Pain Score: 6  Pain Location: Buttocks Pain Descriptors / Indicators: Pressure Pain Intervention(s): Repositioned   Other Treatments:     Therapy/Group: Individual Therapy  Carlos Levering 04/22/2019, 12:12 PM

## 2019-04-23 ENCOUNTER — Inpatient Hospital Stay (HOSPITAL_COMMUNITY): Payer: Medicare Other | Admitting: Physical Therapy

## 2019-04-23 ENCOUNTER — Inpatient Hospital Stay (HOSPITAL_COMMUNITY): Payer: Medicare Other

## 2019-04-23 LAB — PROTIME-INR
INR: 3.1 — ABNORMAL HIGH (ref 0.8–1.2)
Prothrombin Time: 31.5 seconds — ABNORMAL HIGH (ref 11.4–15.2)

## 2019-04-23 MED ORDER — GERHARDT'S BUTT CREAM
TOPICAL_CREAM | Freq: Four times a day (QID) | CUTANEOUS | Status: DC
  Administered 2019-04-25: 1 via TOPICAL
  Filled 2019-04-23 (×5): qty 1

## 2019-04-23 MED ORDER — VITAMINS A & D EX OINT
TOPICAL_OINTMENT | CUTANEOUS | Status: DC | PRN
  Administered 2019-04-26: 1 via TOPICAL
  Filled 2019-04-23 (×4): qty 56.7

## 2019-04-23 MED ORDER — DIPHENOXYLATE-ATROPINE 2.5-0.025 MG PO TABS
1.0000 | ORAL_TABLET | Freq: Four times a day (QID) | ORAL | Status: DC
  Administered 2019-04-23 – 2019-04-24 (×3): 1 via ORAL
  Filled 2019-04-23 (×4): qty 1

## 2019-04-23 MED ORDER — CHLORHEXIDINE GLUCONATE CLOTH 2 % EX PADS
6.0000 | MEDICATED_PAD | Freq: Every day | CUTANEOUS | Status: DC
  Administered 2019-04-24 – 2019-05-05 (×7): 6 via TOPICAL

## 2019-04-23 MED ORDER — WARFARIN 0.5 MG HALF TABLET
1.0000 mg | ORAL_TABLET | Freq: Once | ORAL | Status: AC
  Administered 2019-04-23: 21:00:00 1 mg via ORAL
  Filled 2019-04-23: qty 2
  Filled 2019-04-23: qty 1

## 2019-04-23 MED ORDER — PSYLLIUM 95 % PO PACK
1.0000 | PACK | Freq: Three times a day (TID) | ORAL | Status: DC
  Administered 2019-04-24 – 2019-04-28 (×11): 1 via ORAL
  Filled 2019-04-23 (×19): qty 1

## 2019-04-23 NOTE — Progress Notes (Signed)
ANTICOAGULATION CONSULT NOTE - Follow Up Consult  Pharmacy Consult for Warfarin Indication: atrial fibrillation and St. Jude AVR (1655V)  Patient Measurements: Height: 6\' 2"  (188 cm) Weight: 293 lb 6.9 oz (133.1 kg) IBW/kg (Calculated) : 82.2  Vital Signs: Temp: 97.6 F (36.4 C) (12/23 0647) Temp Source: Oral (12/23 0647) BP: 124/44 (12/23 0647) Pulse Rate: 106 (12/23 0647)  Labs: Recent Labs    04/21/19 1024 04/22/19 0529 04/22/19 1412 04/23/19 0627  HGB 9.0*  --  8.4*  --   HCT 29.5*  --  27.7*  --   PLT 645*  --  617*  --   LABPROT 26.4* 29.0*  --  31.5*  INR 2.4* 2.8*  --  3.1*  CREATININE  --   --  8.09*  --     Estimated Creatinine Clearance: 13.7 mL/min (A) (by C-G formula based on SCr of 8.09 mg/dL (H)).  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 Goal INR range should be 2.5-3.5 given indication; however, per discussion with patient, he was told to aim for 2.5 in mid 2019 by his MD due to history of GIB. Per chart review, appears that GIB events are related to procedures (polypectomy) and GI ulcers.   12/17 Received vitamin K PO 1mg  due to INR 5.4.  Note, patient does not eat any vegetables. Based on this admission's warfarin doses, INRs, and narrow goal range, suspect patient needs less than 5mg  daily for maintenance despite reported PTA warfarin dose: 5 mg daily.  INR today is slightly above goal, rate of uptrend is slowing with reduced dose.    Goal of Therapy:  INR 2.5-3 Monitor platelets by anticoagulation protocol: Yes   Plan:  Warfarin 1mg  PO x 1 tonight Daily INR  Benetta Spar, PharmD, BCPS, [facility] Clinical Pharmacist  Please check AMION for all [place] phone numbers After 10:00 PM, call [place] [phone]

## 2019-04-23 NOTE — Progress Notes (Signed)
Physical Therapy Session Note  Patient Details  Name: Calvin [name] MRN: [id number] Date of Birth: [birth year]  Today's Date: 04/23/2019 PT Individual Time: 0805-0858 PT Individual Time Calculation (min): 53 min   Short Term Goals: Week 3:  PT Short Term Goal 1 (Week 3): Pt will perform sit<>stand w/ max assist +1 PT Short Term Goal 2 (Week 3): Pt will tolerate sitting up and OOB x3 hrs/day PT Short Term Goal 3 (Week 3): Pt will self-propel manual w/c x 25' w/ supervision PT Short Term Goal 4 (Week 3): Pt will maintain upright sitting at EOB w/ supervision x10 min consistently  Skilled Therapeutic Interventions/Progress Updates:   Pt in supine and agreeable to therapy, reports ongoing soreness in bottom from wounds. R/L rolling w/ mod assist while 2nd helper performed pericare and brief management. Pt incontinent of bowel. Donned shorts total assist in supine as well w/ R/L rolling to pull over hips. Pt w/ good ability to lift BLEs off bed in SLR w/o assist. Pt w/ many questions regarding results of team conference yesterday, pt specifically asking about his plan moving forward. Had prolonged discussion regarding this and plan to move forward w/ SNF placement 2/2 barrier of getting to/from dialysis w/ going home as well as low functional level. Also discussed that therapy will continue to work towards progressing standing as able. Encouraged pt to maintain motivation however also discussed his unwillingness to try things that are scary for him. Pt stated he gets to a point that he is spent for the day and wants therapy staff to understand he is trying but physically cannot keep going at a certain point. Pt appreciative of conversation and discussion of how to best help him succeed. Supine>sit w/ min assist w/ increased time and verbal/tactile cues for technique. Worked on sit>stands in stedy from elevated bed surface, however unable to reach more than 1/2 way up to standing w/ only 1 person.  Attempted x2 reps w/ pt achieving strong BLE muscle activation, however lacking sufficient strength. Sit>supine w/ min assist and ended session in supine, all needs in reach.   Therapy Documentation Precautions:  Precautions Precautions: Fall Precaution Comments: monitor HR, L heel wound Restrictions Weight Bearing Restrictions: No Vital Signs: Therapy Vitals Temp: 97.6 F (36.4 C) Temp Source: Oral Pulse Rate: (!) 106 Resp: 18 BP: (!) 124/44 Patient Position (if appropriate): Lying Oxygen Therapy SpO2: 99 % O2 Device: Room Air  Therapy/Group: Individual Therapy  Calvin [name] K [name] 04/23/2019, 9:01 AM

## 2019-04-23 NOTE — Progress Notes (Signed)
[name] Progress Note   Dialysis Orders: Previously on PD -> transitioned recently to HD after episode peritonitis.  Non CKA pt  Assessment/Plan: 1.Debility:PT/OT per CIR.  2. ESRD:PD cath removed 11/16 at another facility. Continue on HD TTS schedulehere.HDyesterday tolerated well.  K 3.7 Tuesday - start HD Thursday on 3  K bath - Run 4 hr - he will need 4.5 hr at dc  3.Hypotension/volume:.on midodrine 10 mg TID for BP support Does not appear grossly volume overloaded. Continue to titrate down volume as tolerated- hard to assess with morbid obesity.Use albumin w/HD for BP support.534 ml removed Tuesday - 1 L the HD prior to that. Suspect infake records incomplete  4. Anemia:Hgb stable 8.3>8.6.> 8.4 12/22Aranesp 237mcg q Thurs (his usual dose)andcourseofIV iron. Hx recent GIB - stable gastric ulcer, on PPI.  5. Secondary hyperparathyroidism:Corr Ca high; phos in goal. - use 2.25 bath - if K is high enough can change to 2 K 2 Ca bath Thursday- hold calcitriol for now - keep prn tums - not using much - on renvela for binders  6. Nutrition:Alb 2 continue pro-stat supplements.diet liberalized to regular with fluid restriction  7. Hx mechanical AoV, A-fib/flutter: Warfarin per pharmacy - hold heparin with HD. on metoprolol  INR 3.1  8. T2DM - per primary  9. Hx testicular cancer  10.Leukocytosis -plateaued- does have TDCand leg wounds-blood cultures NGTD 12/13  11. Frequent bowel movements- GI consulting, given colestipol/lomotil 12. Anxiety - meds per primary  Calvin Jacobson, PA-C [place] [phone] 04/23/2019,12:16 PM  LOS: 16 days   Subjective:   Adjusting to HD - disappointed with slow rehab process  Objective Vitals:   04/22/19 1823 04/22/19 1925 04/22/19 1926 04/23/19 0647  BP: (!) 107/56 (!) 98/35 (!) 93/45 (!) 124/44  Pulse: 86 87  (!) 106  Resp: 16 18  18   Temp: (!) 97 F (36.1  C) 97.8 F (36.6 C)  97.6 F (36.4 C)  TempSrc:  Oral  Oral  SpO2: 100% 100%  99%  Weight:      Height:       Physical Exam General: breathing easily in bed playing games on cell phone Heart: RRR + valve click Lungs: dim BS grossly clear Abdomen: obese soft Extremities:  Tr LE edema Dialysis Access:  Right TDC   Additional Objective Labs:  Intake/Output Summary (Last 24 hours) at 04/23/2019 1253 Last data filed at 04/22/2019 1847 Gross per 24 hour  Intake 120 ml  Output 534 ml  Net -414 ml   Basic Metabolic Panel: Recent Labs  Lab 04/17/19 1313 04/19/19 1409 04/22/19 1412  NA 138 135 134*  K 3.5 4.0 3.7  CL 98 96* 95*  CO2 24 23 22   GLUCOSE 116* 106* 109*  BUN 63* 62* 77*  CREATININE 6.97* 6.70* 8.09*  CALCIUM 9.0 9.5 9.3  PHOS 5.3* 3.7 3.6   Liver Function Tests: Recent Labs  Lab 04/17/19 1313 04/19/19 1409 04/22/19 1412  ALBUMIN 2.0* 2.3* 2.0*   No results for input(s): LIPASE, AMYLASE in the last 168 hours. CBC: Recent Labs  Lab 04/17/19 1313 04/19/19 1409 04/21/19 1024 04/22/19 1412  WBC 13.9* 13.4* 14.1* 14.3*  HGB 8.3* 8.6* 9.0* 8.4*  HCT 27.8* 28.1* 29.5* 27.7*  MCV 93.6 92.4 91.9 91.4  PLT 629* 681* 645* 617*   Blood Culture    Component Value Date/Time   SDES BLOOD RIGHT HAND 04/13/2019 1140   SPECREQUEST  04/13/2019 1140    BOTTLES DRAWN  AEROBIC ONLY Blood Culture adequate volume   CULT  04/13/2019 1140    NO GROWTH 5 DAYS Performed at [facility], [place] [address] [place], [place] [address]    REPTSTATUS 04/18/2019 FINAL 04/13/2019 1140    Cardiac Enzymes: No results for input(s): CKTOTAL, CKMB, CKMBINDEX, TROPONINI in the last 168 hours. CBG: No results for input(s): GLUCAP in the last 168 hours. Iron Studies: No results for input(s): IRON, TIBC, TRANSFERRIN, FERRITIN in the last 72 hours. Lab Results  Component Value Date   INR 3.1 (H) 04/23/2019   INR 2.8 (H) 04/22/2019   INR 2.4 (H) 04/21/2019   PROTIME 21.6  (H) 05/26/2014   PROTIME 26.4 (H) 05/08/2014   PROTIME 31.2 (H) 01/26/2014   Studies/Results: No results found. Medications:  . ALPRAZolam  0.5 mg Oral TID  . calcitRIOL  0.25 mcg Oral Q T,Th,Sa-HD  . Chlorhexidine Gluconate Cloth  6 each Topical BID  . Chlorhexidine Gluconate Cloth  6 each Topical Q0600  . colestipol  4 g Oral BID  . darbepoetin (ARANESP) injection - DIALYSIS  200 mcg Intravenous Q Thu-HD  . diphenoxylate-atropine  1 tablet Oral QID  . feeding supplement (PRO-STAT SUGAR FREE 64)  30 mL Oral BID  . Gerhardt's butt cream   Topical QID  . metoprolol tartrate  12.5 mg Oral BID  . midodrine  10 mg Oral TID WC  . multivitamin  1 tablet Oral QHS  . pantoprazole  40 mg Oral BID  . polycarbophil  625 mg Oral TID WC  . pregabalin  25 mg Oral QHS  . [START ON 04/24/2019] pregabalin  25 mg Oral Q T,Th,Sa-HD  . saccharomyces boulardii  500 mg Oral TID  . sertraline  100 mg Oral Daily  . sevelamer carbonate  1,600 mg Oral TID WC  . Warfarin - Pharmacist Dosing Inpatient   Does not apply [phone]

## 2019-04-23 NOTE — Progress Notes (Signed)
[place] PHYSICAL MEDICINE & REHABILITATION PROGRESS NOTE   Subjective/Complaints: Patient seen sitting up in bed this morning.  Continues to have buttock soreness. Happy with the changes to his diet. Denies constipation. Continues to have frequent BM, 10 this morning, loose but not foul-smelling. Received Lomotil twice this morning.  WBC stable.   ROS: + Sore buttock, improving. Denies CP, SOB, N/V/D   Objective:   No results found. Recent Labs    04/21/19 1024 04/22/19 1412  WBC 14.1* 14.3*  HGB 9.0* 8.4*  HCT 29.5* 27.7*  PLT 645* 617*   Recent Labs    04/22/19 1412  NA 134*  K 3.7  CL 95*  CO2 22  GLUCOSE 109*  BUN 77*  CREATININE 8.09*  CALCIUM 9.3    Intake/Output Summary (Last 24 hours) at 04/23/2019 1159 Last data filed at 04/22/2019 1847 Gross per 24 hour  Intake 120 ml  Output 534 ml  Net -414 ml     Physical Exam: Vital Signs Blood pressure (!) 124/44, pulse (!) 106, temperature 97.6 F (36.4 C), temperature source Oral, resp. rate 18, height 6\' 2"  (1.88 m), weight 133.1 kg, SpO2 99 %. Constitutional: No distress . Vital signs reviewed.  Morbidly obese. Sitting up in chair.  HENT: Normocephalic.  Atraumatic. Eyes: EOMI. No discharge. Cardiovascular: No JVD. Respiratory: Normal effort.  No stridor. GI: Non-distended. Skin: Warm and dry.  Intact. Psych: Flat affect. Musc: Lower extremity edema Neurological: Alert Motor: Bilateral upper extremities: 4/5 proximal distal Right lower extremity: 2+/5 proximal distal Left lower extremity: 2/5 proximal to distal  Assessment/Plan: 1. Functional deficits secondary to debility which require 3+ hours per day of interdisciplinary therapy in a comprehensive inpatient rehab setting.  Physiatrist is providing close team supervision and 24 hour management of active medical problems listed below.  Physiatrist and rehab team continue to assess barriers to discharge/monitor patient progress toward  functional and medical goals  Care Tool:  Bathing  Bathing activity did not occur: Refused Body parts bathed by patient: Front perineal area, Buttocks         Bathing assist Assist Level: 2 Helpers     Upper Body Dressing/Undressing Upper body dressing   What is the patient wearing?: Pull over shirt    Upper body assist Assist Level: Supervision/Verbal cueing    Lower Body Dressing/Undressing Lower body dressing      What is the patient wearing?: Pants, Incontinence brief     Lower body assist Assist for lower body dressing: Maximal Assistance - Patient 25 - 49%     Toileting Toileting    Toileting assist Assist for toileting: 2 Helpers     Transfers Chair/bed transfer  Transfers assist  Chair/bed transfer activity did not occur: Safety/medical concerns  Chair/bed transfer assist level: 2 Helpers     Locomotion Ambulation   Ambulation assist   Ambulation activity did not occur: Safety/medical concerns          Walk 10 feet activity   Assist  Walk 10 feet activity did not occur: Safety/medical concerns        Walk 50 feet activity   Assist Walk 50 feet with 2 turns activity did not occur: Safety/medical concerns         Walk 150 feet activity   Assist Walk 150 feet activity did not occur: Safety/medical concerns         Walk 10 feet on uneven surface  activity   Assist Walk 10 feet on uneven surfaces activity did not  occur: Safety/medical concerns         Wheelchair     Assist Will patient use wheelchair at discharge?: Yes Type of Wheelchair: Manual Wheelchair activity did not occur: Safety/medical concerns  Wheelchair assist level: Minimal Assistance - Patient > 75% Max wheelchair distance: 5'    Wheelchair 50 feet with 2 turns activity    Assist    Wheelchair 50 feet with 2 turns activity did not occur: Safety/medical concerns       Wheelchair 150 feet activity     Assist  Wheelchair 150 feet  activity did not occur: Safety/medical concerns       Blood pressure (!) 124/44, pulse (!) 106, temperature 97.6 F (36.4 C), temperature source Oral, resp. rate 18, height 6\' 2"  (1.88 m), weight 133.1 kg, SpO2 99 %.  Medical Problem List and Plan: 1.  Impaired mobility and ADLs secondary to debility   Continue CIR  2.  St Jude's Aortic Valve/Antithrombotics: -DVT/anticoagulation:  Pharmaceutical: Coumadin  INR subtherapeutic on 12/20, discussed with pharmacy, patient refused heparin gtt.  12/21: INR 2.4  12/22: INR 2.8  12/23: INR 3.1             -antiplatelet therapy: N/A 3. Pain Management: Oxycodone prn  -resumed lyrica at hs only 25mg , added additional 25 mg postdialysis 4. Mood: LCSW to follow for evaluation and support.              -antipsychotic agents: N/A  -anxiety disorder/ recent MDD   - increased xanax back to 0.5mg ,    -continue to provide positive reinforcement. Trying to empower him to take some initiative and accountability for his care and progress  Zoloft increased to home dose of 100 mg on 12/20 5. Neuropsych: This patient is capable of making decisions on his own behalf. 6. Skin/Wound Care: Air mattress overlay for MASD due to ongoing diarrhea.    -gerhardt's cream to buttocks  -rx loose stools  -prevalon boots bilateral heels 7. Fluids/Electrolytes/Nutrition: Strict I/O. Renal diet with 1200 cc FR.  8.   ESRD: Now on HD. Schedule hemodialysis at the end of the day to help with therapy tolerance. TTS schedule   12/10: diet liberalized to help with intake  -volume/edema mgt per nephrology   [facility] Weights   04/20/19 0313 04/22/19 1340 04/22/19 1746  Weight: 134 kg 133.6 kg 133.1 kg    Stable on 12/20, 12/21 9. Hypotension: Monitor BP tid--continue midodrine.  Relatively controlled and asymptomatic on 12/20 10 History of A fib/A flutter: Monitor HR -on coumadin. Continue Lopressor 12.5mg  BID, will consider Toprolol XL 11. Recent GIB/Anemia of chronic  disease: Continue to monitor H/H with serial checks. On Retacrit 10,000 units with HD.     hgb/tranfusion per nephrology-hemoglobin 8.6 on 12/19 12. Loose stool: C diff negative  -continue fiber  -increased probiotic to 500mg   TID  -diet--reinforced that consistent PO intake is important also  -12/18 stopped megace given it's lack of benefit and potential for loose stool (although was having loose stool long before this was started)  Appreciate GI recs, medication initiated, await further recs  12/22: Lomotil prn is ordered but has not been administered; will place general nursing order to administer.   12/23: Lomotil administered twice yesterday. Patient reports 10 BM yesterday. Will change order to scheduled QID. 13. Cough with phlegm: Guaifenesin prn 14. Shortness of breath at rest: encourage IS, anxiety mgt.  Improved 15. Leukocytosis:  -wbc's 13.4 on 12/19, 14.3 on 12/23  Afebrile  -bc negative  no growth 16.  Morbid obesity: Encouraged weight loss  LOS: 16 days A FACE TO FACE EVALUATION WAS PERFORMED  Jenai Scaletta P Melania Kirks 04/23/2019, 11:59 AM

## 2019-04-23 NOTE — Progress Notes (Signed)
Occupational Therapy Session Note  Patient Details  Name: CORT DRAGOO MRN: [id number] Date of Birth: 1955-09-20  Today's Date: 04/23/2019 OT Individual Time: 1415-1445 OT Individual Time Calculation (min): 30 min    Short Term Goals: Week 1:  OT Short Term Goal 1 (Week 1): Pt will don UB clothing EOB with mod A OT Short Term Goal 1 - Progress (Week 1): Met OT Short Term Goal 2 (Week 1): Pt will transfer to EOB with mod A OT Short Term Goal 2 - Progress (Week 1): Not met OT Short Term Goal 3 (Week 1): Pt will reduce anxiety during OT, evidenced by maintaining HR under 130 bpm during bed mobility OT Short Term Goal 3 - Progress (Week 1): Met  Skilled Therapeutic Interventions/Progress Updates:    1:1. Pt received in bed with wife present. Wife requesting using a hoyer to assist pt into stand. OT educates on various lift types to get pt into standing and pt agreeable to starting training in [place] tomorrow. Pt requesting BUE therex 2x115: shoulder flex/ext, row, tricep extention and chest press iwht MOD A to facilitate LUE for full ROM. Exited session with pt seated in bed, exit alarm on and call light tin reach.  Therapy Documentation Precautions:  Precautions Precautions: Fall Precaution Comments: monitor HR, L heel wound Restrictions Weight Bearing Restrictions: No General:   Vital Signs: Therapy Vitals Temp: 97.6 F (36.4 C) Pulse Rate: 85 Resp: 18 BP: (!) 107/51 Patient Position (if appropriate): Lying Oxygen Therapy SpO2: 100 % O2 Device: Room Air Pain:   ADL: ADL Eating: Supervision/safety Where Assessed-Eating: Bed level Grooming: Supervision/safety Where Assessed-Grooming: Bed level Upper Body Bathing: Moderate assistance Where Assessed-Upper Body Bathing: Bed level Lower Body Bathing: Dependent Where Assessed-Lower Body Bathing: Bed level Upper Body Dressing: Maximal assistance Where Assessed-Upper Body Dressing: Edge of bed Lower Body Dressing:  Dependent Where Assessed-Lower Body Dressing: Bed level Toileting: Dependent Where Assessed-Toileting: Bed level Toilet Transfer: Unable to assess Vision   Perception    Praxis   Exercises:   Other Treatments:     Therapy/Group: Individual Therapy  Tonny Branch 04/23/2019, 2:45 PM

## 2019-04-23 NOTE — Plan of Care (Signed)
  Problem: Consults Goal: RH GENERAL PATIENT EDUCATION Description: See Patient Education module for education specifics. Outcome: Progressing Goal: Skin Care Protocol Initiated - if Braden Score 18 or less Description: If consults are not indicated, leave blank or document N/A Outcome: Progressing   Problem: RH BOWEL ELIMINATION Goal: RH STG MANAGE BOWEL WITH ASSISTANCE Description: STG Manage Bowel with Mod Assistance. Outcome: Progressing Goal: RH STG MANAGE BOWEL W/MEDICATION W/ASSISTANCE Description: STG Manage Bowel with Medication with Mod Assistance. Outcome: Progressing   Problem: RH SKIN INTEGRITY Goal: RH STG SKIN FREE OF INFECTION/BREAKDOWN Description: No new breakdown with mod assist  Outcome: Progressing Goal: RH STG ABLE TO PERFORM INCISION/WOUND CARE W/ASSISTANCE Description: STG Able To Perform Incision/Wound Care With Max Assistance. Outcome: Progressing   Problem: RH PAIN MANAGEMENT Goal: RH STG PAIN MANAGED AT OR BELOW PT'S PAIN GOAL Description: < 3 out of 10.  Outcome: Progressing

## 2019-04-23 NOTE — Consult Note (Addendum)
WOC Nurse re-consult Note: Consult for buttocks/gluteal fold requested by patient's wife.  Refer to previous [facility] consult on 12/16.  Pt is almost constantly incontinent of loose stools; I wiped him clean and he immediately stooled again.  It is difficult to promote healing since gluteal fold and bilat inner buttocks are constantly soiled.  There is approx 6X2cm area of patchy areas of full thickness skin loss related to moisture associated skin damage; red and moist, painful macerated skin with yellow moist wounds. These are not pressure injuries.  Gehrarts cream has previiously been ordered, to protect skin and promote healing.  Air mattress was previously ordered to increase airflow and promote drying and healing to the affected areas but patient refused to use; he states he is unable to sleep on the low airloss surface. Pt also has to spend large amounts of time in the wheelchair during therapy.  Dressings would become soiled and trap stool against the skin.  Dressing procedure/placement/frequency: Pt's wife assessed the wound appearance and discussed the varius impediments to healing related to constant loose stools and immobility.  GI is following and is attempting to order medications to "bulk up" the stools. Pt's wife has brought in wipes to clean the patient, so washcloths will not be utilized. Explained that Gerhardts cream contains zinc to repel moisture and protect skin, nystatin to treat candidiasis, and hydrocortisone as an antiinflammatory soothing agent.  She would also prefer to use A&D ointment as a barrier. Will provide guidance to bedside nurses for topical treatment as follows: Gently cleanse buttocks with wipes at the bedside. Apply A&D ointment to buttocks after cleansing, then apply Gerharts cream over the ointment.  Use this routine with each turning and cleaning session.  Please re-consult if further assistance is needed.  Thank-you,  Julien Girt MSN, [place], [place], [place],  [place]

## 2019-04-23 NOTE — Progress Notes (Signed)
Physical Therapy Session Note  Patient Details  Name: Calvin [name] MRN: [id number] Date of Birth: January 18, 1956  Today's Date: 04/23/2019 PT Individual Time: 1030-1130 PT Individual Time Calculation (min): 60 min   Short Term Goals:  Week 3:  PT Short Term Goal 1 (Week 3): Pt will perform sit<>stand w/ max assist +1 PT Short Term Goal 2 (Week 3): Pt will tolerate sitting up and OOB x3 hrs/day PT Short Term Goal 3 (Week 3): Pt will self-propel manual w/c x 25' w/ supervision PT Short Term Goal 4 (Week 3): Pt will maintain upright sitting at EOB w/ supervision x10 min consistently      Skilled Therapeutic Interventions/Progress Updates:   Pt resting in bed; he did not rate buttock pain due to skin breakdown.  He related AM tx with his inability to stand using Stedy; he was quite disappointed, but stated that he will master it.  PT explained strength deficits and relationship to movement problems.  Pt verbalized need to be able to sit up by himself.    neuromuscular re-education via demo and multimodal cues for supine: bil bridging/pelvic tilts, bil adductor squeezes.  Pt's bil hip flexibility deficits limit his ability for rolling and sitting up.   Rolling R in flat bed, no rails with min assist, max cues for technique. With bil feet on floor, leaning R onto elbow> sitting, he required mod assist initially.  With practice,  sitting EOB >< R lateral leans ( onto 2 firm pillows for higher surface) min assist x 5.  Pt became DOE and asked to lie back down; min assist needed for RLE.  After resting with HOB raised, pt ready to try again, but had bowel accident as he rolled R.   PT explained motor planning to pt; he reported that he often states "I am dizzy, or out of breath, etc", when he actually needs time to process the sequence of movement.  PT provided emotional support, and placed a note on bulletin board over pt's bed asking for extra time to process; (pt approved). PT also informed his NT  today, Gracie.  +2 for rolling for hygiene change and to don clean brief.  At end of session, pt in care of [place], [place].      Therapy Documentation Precautions:  Precautions Precautions: Fall Precaution Comments: monitor HR, L heel wound Restrictions Weight Bearing Restrictions: No    Therapy/Group: Individual Therapy  Advik Weatherspoon 04/23/2019, 12:16 PM

## 2019-04-24 ENCOUNTER — Inpatient Hospital Stay (HOSPITAL_COMMUNITY): Payer: Medicare Other

## 2019-04-24 ENCOUNTER — Inpatient Hospital Stay (HOSPITAL_COMMUNITY): Payer: Medicare Other | Admitting: Physical Therapy

## 2019-04-24 LAB — PROTIME-INR
INR: 3.2 — ABNORMAL HIGH (ref 0.8–1.2)
Prothrombin Time: 32.5 seconds — ABNORMAL HIGH (ref 11.4–15.2)

## 2019-04-24 MED ORDER — HEPARIN SODIUM (PORCINE) 1000 UNIT/ML IJ SOLN
INTRAMUSCULAR | Status: AC
  Filled 2019-04-24: qty 1

## 2019-04-24 MED ORDER — HEPARIN SODIUM (PORCINE) 1000 UNIT/ML IJ SOLN
INTRAMUSCULAR | Status: AC
  Filled 2019-04-24: qty 4

## 2019-04-24 MED ORDER — DIPHENOXYLATE-ATROPINE 2.5-0.025 MG PO TABS
1.0000 | ORAL_TABLET | Freq: Four times a day (QID) | ORAL | Status: DC | PRN
  Administered 2019-04-25 – 2019-05-09 (×10): 1 via ORAL
  Filled 2019-04-24 (×10): qty 1

## 2019-04-24 NOTE — Progress Notes (Signed)
Occupational Therapy Weekly Progress Note  Patient Details  Name: Calvin [name] MRN: [id number] Date of Birth: 09/28/1955  Beginning of progress report period: April 15, 2019 End of progress report period: April 24, 2019  Today's Date: 04/24/2019 OT Individual Time: 0800-0900 OT Individual Time Calculation (min): 60 min    Patient has met 2 of 3 short term goals.  Pt has made slow progress this reporting period as limited by decreased activity tolerance, pain d/t skin integrity, self limiting behaviors and anxiety. Pt has improved beazy board transfers to MAX A of 1 and increased his OOB tolerance d/t using TIS chair. D/c plan has changed to SNF d/t slow progress and BOC.  Patient continues to demonstrate the following deficits: muscle weakness, decreased cardiorespiratoy endurance, decreased safety awareness and decreased sitting balance, decreased standing balance, decreased postural control and decreased balance strategies and therefore will continue to benefit from skilled OT intervention to enhance overall performance with BADL and iADL.  Patient progressing toward long term goals..  Continue plan of care.  OT Short Term Goals Week 2:  OT Short Term Goal 1 (Week 2): Pt will remain OOB in w/c for 45 min to increase functional activity tolerance OT Short Term Goal 1 - Progress (Week 2): Met OT Short Term Goal 2 (Week 2): Pt will complete slideboard transfer to [facility] with max +1 OT Short Term Goal 2 - Progress (Week 2): Not met OT Short Term Goal 3 (Week 2): Pt will consistently get OOB 1x a day OT Short Term Goal 3 - Progress (Week 2): Met Week 3:  OT Short Term Goal 1 (Week 3): Pt will remain OOB 1x per day for >30 min to improve tolerance to upright OT Short Term Goal 2 (Week 3): Pt will transfer to [facility] with MAX A of 1 OT Short Term Goal 3 (Week 3): Pt will roll with S to decrease BOC after BM  Skilled Therapeutic Interventions/Progress Updates:    1:1. Pt agreeable to tx with  unrated pain in buttocks. Pt reporting no BM now, however after setting goal to get into chair for session for UB bathing and dressing pt reporting needing to have BM. OT educated on using Beazy board to toilet on [facility], however pt refuses that option and to void on bedpan. Pt has BM and rolls with Mod A in B directions for OT to complete peri care, con new brief and pants. Pt supine>sitting EOB with MOD A for trunk elevation and completes beazy board transfer with MAX A of 1 with counting used for initiation and VC for trunk flexion/hand placement. Upon sitting in TIS, pt combs hair and states he can no longer sit in chair d/t buttock pain. Pt tilted back however refuses to stay in chair. Trf back to bed as stated above and +2 A for sit>supine d/t time constraints. Exited session with pt seated in bed, exit alarm on and call light in reach  Therapy Documentation Precautions:  Precautions Precautions: Fall Precaution Comments: monitor HR, L heel wound Restrictions Weight Bearing Restrictions: No General:   Vital Signs: Therapy Vitals Temp: 97.9 F (36.6 C) Pulse Rate: 91 Resp: 18 BP: (!) 119/51 Patient Position (if appropriate): Lying Oxygen Therapy SpO2: 98 % O2 Device: Room Air Pain:   ADL: ADL Eating: Supervision/safety Where Assessed-Eating: Bed level Grooming: Supervision/safety Where Assessed-Grooming: Bed level Upper Body Bathing: Moderate assistance Where Assessed-Upper Body Bathing: Bed level Lower Body Bathing: Dependent Where Assessed-Lower Body Bathing: Bed level Upper Body Dressing: Maximal  assistance Where Assessed-Upper Body Dressing: Edge of bed Lower Body Dressing: Dependent Where Assessed-Lower Body Dressing: Bed level Toileting: Dependent Where Assessed-Toileting: Bed level Toilet Transfer: Unable to assess Vision   Perception    Praxis   Exercises:   Other Treatments:     Therapy/Group: Individual Therapy  Tonny Branch 04/24/2019, 6:59  AM

## 2019-04-24 NOTE — Progress Notes (Signed)
Physical Therapy Session Note  Patient Details  Name: Calvin [name] MRN: [id number] Date of Birth: May 04, 1955  Today's Date: 04/24/2019 PT Individual Time: 1005-1100 PT Individual Time Calculation (min): 55 min   Short Term Goals: Week 3:  PT Short Term Goal 1 (Week 3): Pt will perform sit<>stand w/ max assist +1 PT Short Term Goal 2 (Week 3): Pt will tolerate sitting up and OOB x3 hrs/day PT Short Term Goal 3 (Week 3): Pt will self-propel manual w/c x 25' w/ supervision PT Short Term Goal 4 (Week 3): Pt will maintain upright sitting at EOB w/ supervision x10 min consistently  Skilled Therapeutic Interventions/Progress Updates:   Pt w/ NT, receiving brief change and pericare. R/L rolling w/ mod assist while therapist donned new pants and placed brief. Pt incontinent of bowel. Supine rest after pericare, per pt's request while therapist set-up sara plus. Supine>sit via R log-roll and then pushing up to seated. Therapist cuing pt in same cues as therapist did during yesterday's PM session for carryover, pt did have improved ease w/ going from R sidelying to seated, however still needed min-mod assist. Dynamic sitting balance w/ min assist while pt changed shirt w/ mod assist. Pt then requesting another supine rest break prior to attempting standing. Returned to sitting EOB w/ same assist as detailed above. Provided pt w/ visual demonstration of how sara works. Stood x2 reps w/ total assist, verbal, tactile, and manual cues to achieve full hip extension in stance. Unable to ultimately achieve full hip extension, but strong glut activation noted. Tolerated static stance for 2-3 sec before needing to sit 2/2 fatigue. Sit>supine w/ mod assist. Pt declining sitting up in chair after session. Performed passive BLE stretching into hip/knee flexion, knee extension, and hip ER stretch, 30 sec x2-3 reps in each position w/ each leg. Ended session in L sidelying for pressure relief, all needs in reach.    Therapy Documentation Precautions:  Precautions Precautions: Fall Precaution Comments: monitor HR, L heel wound Restrictions Weight Bearing Restrictions: No Pain: Pain Assessment Pain Scale: 0-10 Pain Score: 0-No pain   Therapy/Group: Individual Therapy  Arch Methot K Lisandra Mathisen 04/24/2019, 12:00 PM

## 2019-04-24 NOTE — Progress Notes (Signed)
[facility] Gastroenterology Progress Note  Calvin [name] 62 y.o. 02/27/1956   Subjective: Diarrhea has stopped. Denies abdominal pain.  Objective: Vital signs: Vitals:   04/23/19 1929 04/24/19 0619  BP: (!) 106/46 (!) 119/51  Pulse: 96 91  Resp: 17 18  Temp: 98 F (36.7 C) 97.9 F (36.6 C)  SpO2: 100% 98%    Physical Exam: Gen: lethargic, no acute distress, obese HEENT: anicteric sclera CV: RRR Chest: CTA B Abd: soft, nondistended, nontender, +BS   Lab Results: Recent Labs    04/22/19 1412  NA 134*  K 3.7  CL 95*  CO2 22  GLUCOSE 109*  BUN 77*  CREATININE 8.09*  CALCIUM 9.3  PHOS 3.6   Recent Labs    04/22/19 1412  ALBUMIN 2.0*   Recent Labs    04/22/19 1412  WBC 14.3*  HGB 8.4*  HCT 27.7*  MCV 91.4  PLT 617*      Assessment/Plan: Chronic diarrhea has resolved on Lomotil, Colestipol, and also on Metamucil. Would continue Colestipol and Metamucil as an outpt and Lomotil prn. Will sign off. Call if questions. Dr. Watt [name] on call tomorrow and this weekend.   Calvin [name] 04/24/2019, 11:57 AM  Questions please call [phone] ID: Calvin [name], male   DOB: Mar 26, 1956, 63 y.o.   MRN: [id number]

## 2019-04-24 NOTE — Progress Notes (Signed)
Patient signed off the machine with 1hr 31mins of Hd tx left; Patient advised about the imporatnce of adherence to the HD tx but he stated that he was not feeling well and he wanted the tx to stop.

## 2019-04-24 NOTE — Progress Notes (Signed)
RN got report from HD regarding pt didn't tolerate HD well and wanted to end session early. Also told RN pt had BM while in HD but wanted to wait until back on unit to get cleaned up. Pt returned to room and was "swimmy headed" and nauseous didn't want to be moved. RN and NT went in room to attempt to clean up pt with wife but pt refused wanted to wait til feeling better to tolerate rolling in bed. NT and wife were finally able to clean pt up after sitting in BM for a while even tho educated its not helping him buttock breakdown.   Erie Noe, RN

## 2019-04-24 NOTE — Plan of Care (Signed)
  Problem: Consults Goal: RH GENERAL PATIENT EDUCATION Description: See Patient Education module for education specifics. Outcome: Progressing Goal: Skin Care Protocol Initiated - if Braden Score 18 or less Description: If consults are not indicated, leave blank or document N/A Outcome: Progressing   Problem: RH BOWEL ELIMINATION Goal: RH STG MANAGE BOWEL WITH ASSISTANCE Description: STG Manage Bowel with Mod Assistance. Outcome: Progressing Goal: RH STG MANAGE BOWEL W/MEDICATION W/ASSISTANCE Description: STG Manage Bowel with Medication with Mod Assistance. Outcome: Progressing   Problem: RH SKIN INTEGRITY Goal: RH STG SKIN FREE OF INFECTION/BREAKDOWN Description: No new breakdown with mod assist  Outcome: Progressing Goal: RH STG ABLE TO PERFORM INCISION/WOUND CARE W/ASSISTANCE Description: STG Able To Perform Incision/Wound Care With Max Assistance. Outcome: Progressing   Problem: RH PAIN MANAGEMENT Goal: RH STG PAIN MANAGED AT OR BELOW PT'S PAIN GOAL Description: < 3 out of 10.  Outcome: Progressing

## 2019-04-24 NOTE — Progress Notes (Signed)
[facility] PHYSICAL MEDICINE & REHABILITATION PROGRESS NOTE   Subjective/Complaints: Patient seen sitting up in bed this morning.  Continues to have buttock soreness. Happy with the changes to his diet. Diarrhea has finally stopped with scheduled Lomotil and metamucil.  Patient would prefer a longer stay and is motivated to improve his function.   ROS: + Sore buttock, improving. Denies CP, SOB, N/V/D   Objective:   No results found. Recent Labs    04/22/19 1412  WBC 14.3*  HGB 8.4*  HCT 27.7*  PLT 617*   Recent Labs    04/22/19 1412  NA 134*  K 3.7  CL 95*  CO2 22  GLUCOSE 109*  BUN 77*  CREATININE 8.09*  CALCIUM 9.3    Intake/Output Summary (Last 24 hours) at 04/24/2019 1112 Last data filed at 04/23/2019 1859 Gross per 24 hour  Intake 360 ml  Output --  Net 360 ml     Physical Exam: Vital Signs Blood pressure (!) 119/51, pulse 91, temperature 97.9 F (36.6 C), resp. rate 18, height 6\' 2"  (1.88 m), weight 128.8 kg, SpO2 98 %. Constitutional: No distress . Vital signs reviewed.  Morbidly obese. Sitting up in chair.  HENT: Normocephalic.  Atraumatic. Eyes: EOMI. No discharge. Cardiovascular: No JVD. Respiratory: Normal effort.  No stridor. GI: Non-distended. Skin: Warm and dry.  Intact. Psych: Flat affect. Musc: Lower extremity edema Neurological: Alert Motor: Bilateral upper extremities: 4/5 proximal distal Right lower extremity: 2+/5 proximal distal Left lower extremity: 2+/5 proximal to distal  Assessment/Plan: 1. Functional deficits secondary to debility which require 3+ hours per day of interdisciplinary therapy in a comprehensive inpatient rehab setting.  Physiatrist is providing close team supervision and 24 hour management of active medical problems listed below.  Physiatrist and rehab team continue to assess barriers to discharge/monitor patient progress toward functional and medical goals  Care Tool:  Bathing  Bathing activity did not  occur: Refused Body parts bathed by patient: Front perineal area, Buttocks         Bathing assist Assist Level: 2 Helpers     Upper Body Dressing/Undressing Upper body dressing   What is the patient wearing?: Pull over shirt    Upper body assist Assist Level: Supervision/Verbal cueing    Lower Body Dressing/Undressing Lower body dressing      What is the patient wearing?: Pants, Incontinence brief     Lower body assist Assist for lower body dressing: Maximal Assistance - Patient 25 - 49%     Toileting Toileting    Toileting assist Assist for toileting: 2 Helpers     Transfers Chair/bed transfer  Transfers assist  Chair/bed transfer activity did not occur: Safety/medical concerns  Chair/bed transfer assist level: 2 Helpers     Locomotion Ambulation   Ambulation assist   Ambulation activity did not occur: Safety/medical concerns          Walk 10 feet activity   Assist  Walk 10 feet activity did not occur: Safety/medical concerns        Walk 50 feet activity   Assist Walk 50 feet with 2 turns activity did not occur: Safety/medical concerns         Walk 150 feet activity   Assist Walk 150 feet activity did not occur: Safety/medical concerns         Walk 10 feet on uneven surface  activity   Assist Walk 10 feet on uneven surfaces activity did not occur: Safety/medical concerns  Wheelchair     Assist Will patient use wheelchair at discharge?: Yes Type of Wheelchair: Manual Wheelchair activity did not occur: Safety/medical concerns  Wheelchair assist level: Minimal Assistance - Patient > 75% Max wheelchair distance: 5'    Wheelchair 50 feet with 2 turns activity    Assist    Wheelchair 50 feet with 2 turns activity did not occur: Safety/medical concerns       Wheelchair 150 feet activity     Assist  Wheelchair 150 feet activity did not occur: Safety/medical concerns       Blood pressure (!)  119/51, pulse 91, temperature 97.9 F (36.6 C), resp. rate 18, height 6\' 2"  (1.88 m), weight 128.8 kg, SpO2 98 %.  Medical Problem List and Plan: 1.  Impaired mobility and ADLs secondary to debility   Continue CIR  2.  St Jude's Aortic Valve/Antithrombotics: -DVT/anticoagulation:  Pharmaceutical: Coumadin  INR subtherapeutic on 12/20, discussed with pharmacy, patient refused heparin gtt.  12/21: INR 2.4  12/22: INR 2.8  12/23: INR 3.1  12/24: INR 3.2             -antiplatelet therapy: N/A 3. Pain Management: Oxycodone prn  -resumed lyrica at hs only 25mg , added additional 25 mg postdialysis 4. Mood: LCSW to follow for evaluation and support.              -antipsychotic agents: N/A  -anxiety disorder/ recent MDD   - increased xanax back to 0.5mg ,    -continue to provide positive reinforcement. Trying to empower him to take some initiative and accountability for his care and progress  Zoloft increased to home dose of 100 mg on 12/20 5. Neuropsych: This patient is capable of making decisions on his own behalf. 6. Skin/Wound Care: Air mattress overlay for MASD due to ongoing diarrhea.    -gerhardt's cream to buttocks  -rx loose stools  -prevalon boots bilateral heels 7. Fluids/Electrolytes/Nutrition: Strict I/O. Renal diet with 1200 cc FR.  8.   ESRD: Now on HD. Schedule hemodialysis at the end of the day to help with therapy tolerance. TTS schedule   12/10: diet liberalized to help with intake  -volume/edema mgt per nephrology   [facility] Weights   04/22/19 1340 04/22/19 1746 04/24/19 1027  Weight: 133.6 kg 133.1 kg 128.8 kg    Stable on 12/20, 12/21, 12/22 9. Hypotension: Monitor BP tid--continue midodrine.  Relatively controlled and asymptomatic on 12/20, 12/24 10 History of A fib/A flutter: Monitor HR -on coumadin. Continue Lopressor 12.5mg  BID. 11. Recent GIB/Anemia of chronic disease: Continue to monitor H/H with serial checks. On Retacrit 10,000 units with HD.      hgb/tranfusion per nephrology-hemoglobin 8.6 on 12/19 12. Loose stool: C diff negative  -continue fiber  -increased probiotic to 500mg   TID  -diet--reinforced that consistent PO intake is important also  -12/18 stopped megace given it's lack of benefit and potential for loose stool (although was having loose stool long before this was started)  Appreciate GI recs, medication initiated, await further recs  12/22: Lomotil prn is ordered but has not been administered; will place general nursing order to administer.   12/23: Lomotil administered twice yesterday. Patient reports 10 BM yesterday. Will change order to scheduled QID.  12/24: Diarrhea resolved. Will change Lomotil back to prn. Continue metamucil which has helped stool be more formed. Appreciate pharmacy's input.  13. Cough with phlegm: Guaifenesin prn 14. Shortness of breath at rest: encourage IS, anxiety mgt. Improved 15. Leukocytosis:  -wbc's 13.4 on  12/19, 14.3 on 12/23  Afebrile  -bc negative no growth 16.  Morbid obesity: Encouraged weight loss  LOS: 17 days A FACE TO FACE EVALUATION WAS PERFORMED  Khiem Gargis P Westyn Driggers 04/24/2019, 11:12 AM

## 2019-04-24 NOTE — Progress Notes (Signed)
[name] Progress Note   Dialysis Orders: Previously on PD -> transitioned recently to HD after episode peritonitis.  Non [facility]A pt  Assessment/Plan: 1.Debility:PT/OT per CIR.  2. ESRD:PD cath removed 11/16 at another facility. Continue on HD TTS schedulehere.HDyesterday tolerated well.  K 3.7 Tuesday - start HD Thursday on 3  K bath - Run 4 hr - he will need 4.5 hr at dc - next HD will be on Sunday due to holiday schedule  3.Hypotension/volume:.on midodrine 10 mg TID for BP support Does not appear grossly volume overloaded. Continue to titrate down volume as tolerated- hard to assess with morbid obesity.Use albumin w/HD for BP support.534 ml removed Tuesday - 1 L the HD prior to that.   4. Anemia:Hgb stable 8.3>8.6.> 8.4 12/22Aranesp 210mcg q Thurs (his usual dose)andcourseofIV iron. Hx recent GIB - stable gastric ulcer, on PPI.  5. Secondary hyperparathyroidism:Corr Ca high; phos in goal. - use 2.25 bath - if K is high enough can change to 2 K 2 Ca bath Thursday- hold calcitriol for now - keep prn tums - not using much - on renvela for binders  6. Nutrition:Alb 2 continue pro-stat supplements.diet liberalized to regular with fluid restriction  7. Hx mechanical AoV, A-fib/flutter: Warfarin per pharmacy - hold heparin with HD. on metoprolol  INR 3.1  8. T2DM - per primary  9. Hx testicular cancer  10.Leukocytosis -plateaued- does have TDCand leg wounds-blood cultures NGTD 12/13  11. Frequent bowel movements- GI consulting, given colestipol/lomotil 12. Anxiety - meds per primary  [name], PA-C [place] [phone] 04/24/2019,10:50 AM  LOS: 17 days   Subjective:   Stood up today for the first time.   Objective Vitals:   04/23/19 1416 04/23/19 1929 04/24/19 0619 04/24/19 0638  BP: (!) 107/51 (!) 106/46 (!) 119/51   Pulse: 85 96 91   Resp: 18 17 18    Temp: 97.6 F (36.4 C) 98 F (36.7  C) 97.9 F (36.6 C)   TempSrc:      SpO2: 100% 100% 98%   Weight:    128.8 kg  Height:       Physical Exam General: anxious morbidly obese male Heart: RRR + valve click Lungs: dim BS grossly clear poor expansion Abdomen: obese soft Extremities:  Tr LE edema Dialysis Access:  Right TDC   Additional Objective Labs:  Intake/Output Summary (Last 24 hours) at 04/24/2019 1050 Last data filed at 04/23/2019 1859 Gross per 24 hour  Intake 360 ml  Output --  Net 360 ml   Basic Metabolic Panel: Recent Labs  Lab 04/17/19 1313 04/19/19 1409 04/22/19 1412  NA 138 135 134*  K 3.5 4.0 3.7  CL 98 96* 95*  CO2 24 23 22   GLUCOSE 116* 106* 109*  BUN 63* 62* 77*  CREATININE 6.97* 6.70* 8.09*  CALCIUM 9.0 9.5 9.3  PHOS 5.3* 3.7 3.6   Liver Function Tests: Recent Labs  Lab 04/17/19 1313 04/19/19 1409 04/22/19 1412  ALBUMIN 2.0* 2.3* 2.0*   No results for input(s): LIPASE, AMYLASE in the last 168 hours. CBC: Recent Labs  Lab 04/17/19 1313 04/19/19 1409 04/21/19 1024 04/22/19 1412  WBC 13.9* 13.4* 14.1* 14.3*  HGB 8.3* 8.6* 9.0* 8.4*  HCT 27.8* 28.1* 29.5* 27.7*  MCV 93.6 92.4 91.9 91.4  PLT 629* 681* 645* 617*   Blood Culture    Component Value Date/Time   SDES BLOOD RIGHT HAND 04/13/2019 1140   SPECREQUEST  04/13/2019 1140    BOTTLES  DRAWN AEROBIC ONLY Blood Culture adequate volume   CULT  04/13/2019 1140    NO GROWTH 5 DAYS Performed at [facility], [place] [address] [place], [place] [address]    REPTSTATUS 04/18/2019 FINAL 04/13/2019 1140    Cardiac Enzymes: No results for input(s): CKTOTAL, CKMB, CKMBINDEX, TROPONINI in the last 168 hours. CBG: No results for input(s): GLUCAP in the last 168 hours. Iron Studies: No results for input(s): IRON, TIBC, TRANSFERRIN, FERRITIN in the last 72 hours. Lab Results  Component Value Date   INR 3.2 (H) 04/24/2019   INR 3.1 (H) 04/23/2019   INR 2.8 (H) 04/22/2019   PROTIME 21.6 (H) 05/26/2014   PROTIME 26.4  (H) 05/08/2014   PROTIME 31.2 (H) 01/26/2014   Studies/Results: No results found. Medications:  . ALPRAZolam  0.5 mg Oral TID  . Chlorhexidine Gluconate Cloth  6 each Topical Q0600  . colestipol  4 g Oral BID  . darbepoetin (ARANESP) injection - DIALYSIS  200 mcg Intravenous Q Thu-HD  . diphenoxylate-atropine  1 tablet Oral QID  . feeding supplement (PRO-STAT SUGAR FREE 64)  30 mL Oral BID  . Gerhardt's butt cream   Topical QID  . metoprolol tartrate  12.5 mg Oral BID  . midodrine  10 mg Oral TID WC  . multivitamin  1 tablet Oral QHS  . pantoprazole  40 mg Oral BID  . polycarbophil  625 mg Oral TID WC  . pregabalin  25 mg Oral QHS  . pregabalin  25 mg Oral Q T,Th,Sa-HD  . psyllium  1 packet Oral TID  . saccharomyces boulardii  500 mg Oral TID  . sertraline  100 mg Oral Daily  . sevelamer carbonate  1,600 mg Oral TID WC  . Warfarin - Pharmacist Dosing Inpatient   Does not apply [phone]

## 2019-04-24 NOTE — Progress Notes (Signed)
ANTICOAGULATION CONSULT NOTE - Follow Up Consult  Pharmacy Consult for Warfarin Indication: atrial fibrillation and St. Jude AVR (0923R)  Patient Measurements: Height: 6\' 2"  (188 cm) Weight: 283 lb 15.2 oz (128.8 kg) IBW/kg (Calculated) : 82.2  Vital Signs: Temp: 97.9 F (36.6 C) (12/24 0619) BP: 119/51 (12/24 0619) Pulse Rate: 91 (12/24 0619)  Labs: Recent Labs    04/22/19 0529 04/22/19 1412 04/23/19 0627 04/24/19 0539  HGB  --  8.4*  --   --   HCT  --  27.7*  --   --   PLT  --  617*  --   --   LABPROT 29.0*  --  31.5* 32.5*  INR 2.8*  --  3.1* 3.2*  CREATININE  --  8.09*  --   --     Estimated Creatinine Clearance: 13.5 mL/min (A) (by C-G formula based on SCr of 8.09 mg/dL (H)).  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 Goal INR range should be 2.5-3.5 given indication; however, per discussion with patient, he was told to aim for 2.5 in mid 2019 by his MD due to history of GIB. Per chart review, appears that GIB events are related to procedures (polypectomy) and GI ulcers.   12/17 Received vitamin K PO 1mg  due to INR 5.4.  Note, patient does not eat any vegetables. Did not have bleeding other than small amounts from rectal wounds.  Based on this admission's warfarin doses, INRs, and narrow goal range, suspect patient needs less than 5mg  daily (possibly ~3mg  daily) for maintenance despite reported PTA warfarin dose: 5 mg daily.  INR today is slightly above goal. Will hold dose tonight so avg daily dose in last 7d will be 3mg .   Goal of Therapy:  INR 2.5-3   Plan:  Hold Warfarin x 1 tonight Daily INR  Benetta Spar, PharmD, BCPS, [facility] Clinical Pharmacist  Please check AMION for all [place] phone numbers After 10:00 PM, call [place] [phone]

## 2019-04-25 LAB — PROTIME-INR
INR: 3 — ABNORMAL HIGH (ref 0.8–1.2)
Prothrombin Time: 31.2 seconds — ABNORMAL HIGH (ref 11.4–15.2)

## 2019-04-25 MED ORDER — WARFARIN SODIUM 3 MG PO TABS
3.0000 mg | ORAL_TABLET | Freq: Once | ORAL | Status: AC
  Administered 2019-04-25: 3 mg via ORAL
  Filled 2019-04-25: qty 1

## 2019-04-25 NOTE — Progress Notes (Signed)
ANTICOAGULATION CONSULT NOTE - Follow Up Consult  Pharmacy Consult for Warfarin Indication: atrial fibrillation and St. Jude AVR (8841Y)  Patient Measurements: Height: 6\' 2"  (188 cm) Weight: 285 lb 7.9 oz (129.5 kg) IBW/kg (Calculated) : 82.2  Vital Signs: Temp: 97.7 F (36.5 C) (12/25 0423) BP: 109/48 (12/25 0423) Pulse Rate: 98 (12/25 0423)  Labs: Recent Labs    04/23/19 0627 04/24/19 0539 04/25/19 0456  LABPROT 31.5* 32.5* 31.2*  INR 3.1* 3.2* 3.0*    Estimated Creatinine Clearance: 13.5 mL/min (A) (by C-G formula based on SCr of 8.09 mg/dL (H)).  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 Goal INR range should be 2.5-3.5 given indication; however, per discussion with patient, he was told to aim for 2.5 in mid 2019 by his MD due to history of GIB. Per chart review, appears that GIB events are related to procedures (polypectomy) and GI ulcers.   12/17 Received vitamin K PO 1mg  due to INR 5.4.  Note, patient does not eat any vegetables. Did not have bleeding other than small amounts from rectal wounds.  Based on this admission's warfarin doses, INRs, and narrow goal range, suspect patient needs less than 5mg  daily (possibly ~3mg  daily) for maintenance despite reported PTA warfarin dose: 5 mg daily.  INR today of 3.0 is at goal (2.5-3) after warfarin was held on 12/24. Hgb 8.4. No reported bleeding.  Goal of Therapy:  INR 2.5-3   Plan:  Warfarin 3mg  x1 tonight Monitor INR, CBC, and S/S of bleeding daily   Cristela Felt, PharmD PGY1 Pharmacy Resident [employer]: [phone]

## 2019-04-25 NOTE — Progress Notes (Signed)
[place] PHYSICAL MEDICINE & REHABILITATION PROGRESS NOTE   Subjective/Complaints: Patient seen sitting up in bed this morning.  Continues to have buttock soreness. Unsure whether he had diarrhea this morning.  Sleeping well at night.  His wife will be visiting for Christmas today.   ROS: + Sore buttock, improving. Denies CP, SOB, N/V/D   Objective:   No results found. Recent Labs    04/22/19 1412  WBC 14.3*  HGB 8.4*  HCT 27.7*  PLT 617*   Recent Labs    04/22/19 1412  NA 134*  K 3.7  CL 95*  CO2 22  GLUCOSE 109*  BUN 77*  CREATININE 8.09*  CALCIUM 9.3    Intake/Output Summary (Last 24 hours) at 04/25/2019 0842 Last data filed at 04/24/2019 2013 Gross per 24 hour  Intake 360 ml  Output 209 ml  Net 151 ml     Physical Exam: Vital Signs Blood pressure (!) 109/48, pulse 98, temperature 97.7 F (36.5 C), resp. rate 20, height 6\' 2"  (1.88 m), weight 129.5 kg, SpO2 96 %. Constitutional: No distress . Vital signs reviewed.  Morbidly obese. Sitting up in chair.  HENT: Normocephalic.  Atraumatic. Eyes: EOMI. No discharge. Cardiovascular: No JVD. Respiratory: Normal effort.  No stridor. GI: Non-distended. Skin: Warm and dry.  Intact. Psych: Flat affect. Musc: Lower extremity edema Neurological: Alert Motor: Bilateral upper extremities: 4/5 proximal distal Right lower extremity: 3/5 proximal distal Left lower extremity: 2+/5 proximal to distal  Assessment/Plan: 1. Functional deficits secondary to debility which require 3+ hours per day of interdisciplinary therapy in a comprehensive inpatient rehab setting.  Physiatrist is providing close team supervision and 24 hour management of active medical problems listed below.  Physiatrist and rehab team continue to assess barriers to discharge/monitor patient progress toward functional and medical goals  Care Tool:  Bathing  Bathing activity did not occur: Refused Body parts bathed by patient: Front  perineal area, Buttocks         Bathing assist Assist Level: 2 Helpers     Upper Body Dressing/Undressing Upper body dressing   What is the patient wearing?: Pull over shirt    Upper body assist Assist Level: Supervision/Verbal cueing    Lower Body Dressing/Undressing Lower body dressing      What is the patient wearing?: Pants, Incontinence brief     Lower body assist Assist for lower body dressing: Maximal Assistance - Patient 25 - 49%     Toileting Toileting    Toileting assist Assist for toileting: 2 Helpers     Transfers Chair/bed transfer  Transfers assist  Chair/bed transfer activity did not occur: Safety/medical concerns  Chair/bed transfer assist level: 2 Helpers     Locomotion Ambulation   Ambulation assist   Ambulation activity did not occur: Safety/medical concerns          Walk 10 feet activity   Assist  Walk 10 feet activity did not occur: Safety/medical concerns        Walk 50 feet activity   Assist Walk 50 feet with 2 turns activity did not occur: Safety/medical concerns         Walk 150 feet activity   Assist Walk 150 feet activity did not occur: Safety/medical concerns         Walk 10 feet on uneven surface  activity   Assist Walk 10 feet on uneven surfaces activity did not occur: Safety/medical concerns         Wheelchair     Assist Will  patient use wheelchair at discharge?: Yes Type of Wheelchair: Manual Wheelchair activity did not occur: Safety/medical concerns  Wheelchair assist level: Minimal Assistance - Patient > 75% Max wheelchair distance: 5'    Wheelchair 50 feet with 2 turns activity    Assist    Wheelchair 50 feet with 2 turns activity did not occur: Safety/medical concerns       Wheelchair 150 feet activity     Assist  Wheelchair 150 feet activity did not occur: Safety/medical concerns       Blood pressure (!) 109/48, pulse 98, temperature 97.7 F (36.5 C), resp.  rate 20, height 6\' 2"  (1.88 m), weight 129.5 kg, SpO2 96 %.  Medical Problem List and Plan: 1.  Impaired mobility and ADLs secondary to debility   Continue CIR  2.  St Jude's Aortic Valve/Antithrombotics: -DVT/anticoagulation:  Pharmaceutical: Coumadin  INR subtherapeutic on 12/20, discussed with pharmacy, patient refused heparin gtt.  12/21: INR 2.4  12/22: INR 2.8  12/23: INR 3.1  12/24: INR 3.2  12/25: INR 3.0             -antiplatelet therapy: N/A 3. Pain Management: Oxycodone prn  -resumed lyrica at hs only 25mg , added additional 25 mg postdialysis 4. Mood: LCSW to follow for evaluation and support.              -antipsychotic agents: N/A  -anxiety disorder/ recent MDD   - increased xanax back to 0.5mg ,    -continue to provide positive reinforcement. Trying to empower him to take some initiative and accountability for his care and progress  Zoloft increased to home dose of 100 mg on 12/20 5. Neuropsych: This patient is capable of making decisions on his own behalf. 6. Skin/Wound Care: Air mattress overlay for MASD due to ongoing diarrhea.    -gerhardt's cream to buttocks  -rx loose stools  -prevalon boots bilateral heels 7. Fluids/Electrolytes/Nutrition: Strict I/O. Renal diet with 1200 cc FR.  8.   ESRD: Now on HD. Schedule hemodialysis at the end of the day to help with therapy tolerance. TTS schedule   12/10: diet liberalized to help with intake  -volume/edema mgt per nephrology   [facility] Weights   04/22/19 1746 04/24/19 0638 04/25/19 0413  Weight: 133.1 kg 128.8 kg 129.5 kg    Stable on 12/20, 12/21, 12/22, 12/25 9. Hypotension: Monitor BP tid--continue midodrine.  Relatively controlled and asymptomatic on 12/20, 12/24, 12/25 10 History of A fib/A flutter: Monitor HR -on coumadin. Continue Lopressor 12.5mg  BID. 11. Recent GIB/Anemia of chronic disease: Continue to monitor H/H with serial checks. On Retacrit 10,000 units with HD.     hgb/tranfusion per  nephrology-hemoglobin 8.6 on 12/19 12. Loose stool: C diff negative  -continue fiber  -increased probiotic to 500mg   TID  -diet--reinforced that consistent PO intake is important also  -12/18 stopped megace given it's lack of benefit and potential for loose stool (although was having loose stool long before this was started)  Appreciate GI recs, medication initiated, await further recs  12/22: Lomotil prn is ordered but has not been administered; will place general nursing order to administer.   12/23: Lomotil administered twice yesterday. Patient reports 10 BM yesterday. Will change order to scheduled QID.  12/24: Diarrhea resolved. Will change Lomotil back to prn. Continue metamucil which has helped stool be more formed. Appreciate pharmacy's input.  13. Cough with phlegm: Guaifenesin prn 14. Shortness of breath at rest: encourage IS, anxiety mgt. Improved 15. Leukocytosis:  -wbc's 13.4 on 12/19,  14.3 on 12/23  Afebrile  -bc negative no growth 16.  Morbid obesity: Encouraged weight loss  LOS: 18 days A FACE TO FACE EVALUATION WAS PERFORMED  Clide Deutscher Naveed Humphres 04/25/2019, 8:42 AM

## 2019-04-26 ENCOUNTER — Inpatient Hospital Stay (HOSPITAL_COMMUNITY): Payer: Medicare Other

## 2019-04-26 LAB — PROTIME-INR
INR: 3.1 — ABNORMAL HIGH (ref 0.8–1.2)
Prothrombin Time: 32.2 seconds — ABNORMAL HIGH (ref 11.4–15.2)

## 2019-04-26 MED ORDER — COLESTIPOL HCL 1 G PO TABS
4.0000 g | ORAL_TABLET | Freq: Three times a day (TID) | ORAL | Status: DC
  Administered 2019-04-26 – 2019-05-03 (×9): 4 g via ORAL
  Administered 2019-05-03: 22:00:00 2 g via ORAL
  Administered 2019-05-04 – 2019-05-09 (×8): 4 g via ORAL
  Filled 2019-04-26 (×43): qty 4

## 2019-04-26 NOTE — Progress Notes (Signed)
[place] PHYSICAL MEDICINE & REHABILITATION PROGRESS NOTE   Subjective/Complaints: Patient seen sitting up in bed this morning.  Continues to have buttock soreness. Diarrhea improved/resolved Sleeping well at night.  Hopes he will be able to stay here longer   ROS: + Sore buttock, improving. Denies CP, SOB, N/V/D   Objective:   No results found. No results for input(s): WBC, HGB, HCT, PLT in the last 72 hours. No results for input(s): NA, K, CL, CO2, GLUCOSE, BUN, CREATININE, CALCIUM in the last 72 hours.  Intake/Output Summary (Last 24 hours) at 04/26/2019 1105 Last data filed at 04/26/2019 0817 Gross per 24 hour  Intake 480 ml  Output --  Net 480 ml     Physical Exam: Vital Signs Blood pressure (!) 114/47, pulse 93, temperature 97.8 F (36.6 C), resp. rate 16, height 6\' 2"  (1.88 m), weight 130.4 kg, SpO2 97 %. Constitutional: No distress . Vital signs reviewed.  Morbidly obese. Sitting up in chair.  HENT: Normocephalic.  Atraumatic. Eyes: EOMI. No discharge. Cardiovascular: No JVD. Respiratory: Normal effort.  No stridor. GI: Non-distended. Skin: Warm and dry.  Intact. Psych: Flat affect. Musc: Lower extremity edema Neurological: Alert Motor: Bilateral upper extremities: 4/5 proximal distal Right lower extremity: 3/5 proximal distal Left lower extremity: 3/5 proximal to distal  Assessment/Plan: 1. Functional deficits secondary to debility which require 3+ hours per day of interdisciplinary therapy in a comprehensive inpatient rehab setting.  Physiatrist is providing close team supervision and 24 hour management of active medical problems listed below.  Physiatrist and rehab team continue to assess barriers to discharge/monitor patient progress toward functional and medical goals  Care Tool:  Bathing  Bathing activity did not occur: Refused Body parts bathed by patient: Front perineal area, Buttocks         Bathing assist Assist Level: 2 Helpers      Upper Body Dressing/Undressing Upper body dressing   What is the patient wearing?: Pull over shirt    Upper body assist Assist Level: Supervision/Verbal cueing    Lower Body Dressing/Undressing Lower body dressing      What is the patient wearing?: Pants, Incontinence brief     Lower body assist Assist for lower body dressing: Maximal Assistance - Patient 25 - 49%     Toileting Toileting    Toileting assist Assist for toileting: 2 Helpers     Transfers Chair/bed transfer  Transfers assist  Chair/bed transfer activity did not occur: Safety/medical concerns  Chair/bed transfer assist level: 2 Helpers     Locomotion Ambulation   Ambulation assist   Ambulation activity did not occur: Safety/medical concerns          Walk 10 feet activity   Assist  Walk 10 feet activity did not occur: Safety/medical concerns        Walk 50 feet activity   Assist Walk 50 feet with 2 turns activity did not occur: Safety/medical concerns         Walk 150 feet activity   Assist Walk 150 feet activity did not occur: Safety/medical concerns         Walk 10 feet on uneven surface  activity   Assist Walk 10 feet on uneven surfaces activity did not occur: Safety/medical concerns         Wheelchair     Assist Will patient use wheelchair at discharge?: Yes Type of Wheelchair: Manual Wheelchair activity did not occur: Safety/medical concerns  Wheelchair assist level: Minimal Assistance - Patient > 75% Max wheelchair distance:  5'    Wheelchair 50 feet with 2 turns activity    Assist    Wheelchair 50 feet with 2 turns activity did not occur: Safety/medical concerns       Wheelchair 150 feet activity     Assist  Wheelchair 150 feet activity did not occur: Safety/medical concerns       Blood pressure (!) 114/47, pulse 93, temperature 97.8 F (36.6 C), resp. rate 16, height 6\' 2"  (1.88 m), weight 130.4 kg, SpO2 97 %.  Medical Problem  List and Plan: 1.  Impaired mobility and ADLs secondary to debility   Continue CIR  2.  St Jude's Aortic Valve/Antithrombotics: -DVT/anticoagulation:  Pharmaceutical: Coumadin  INR subtherapeutic on 12/20, discussed with pharmacy, patient refused heparin gtt.  12/21: INR 2.4  12/22: INR 2.8  12/23: INR 3.1  12/24: INR 3.2  12/25: INR 3.0  12/26: INR 3.1             -antiplatelet therapy: N/A 3. Pain Management: Oxycodone prn  -resumed lyrica at hs only 25mg , added additional 25 mg postdialysis 4. Mood: LCSW to follow for evaluation and support.              -antipsychotic agents: N/A  -anxiety disorder/ recent MDD   - increased xanax back to 0.5mg ,    -continue to provide positive reinforcement. Trying to empower him to take some initiative and accountability for his care and progress  Zoloft increased to home dose of 100 mg on 12/20 5. Neuropsych: This patient is capable of making decisions on his own behalf. 6. Skin/Wound Care: Air mattress overlay for MASD due to ongoing diarrhea.    -gerhardt's cream to buttocks  -rx loose stools  -prevalon boots bilateral heels 7. Fluids/Electrolytes/Nutrition: Strict I/O. Renal diet with 1200 cc FR.  8.   ESRD: Now on HD. Schedule hemodialysis at the end of the day to help with therapy tolerance. TTS schedule   12/10: diet liberalized to help with intake  -volume/edema mgt per nephrology   [facility] Weights   04/24/19 8099 04/25/19 0413 04/26/19 0432  Weight: 128.8 kg 129.5 kg 130.4 kg    Stable on 12/20, 12/21, 12/22, 12/25, 12/26 9. Hypotension: Monitor BP tid--continue midodrine.  Relatively controlled and asymptomatic on 12/20, 12/24, 12/25 10 History of A fib/A flutter: Monitor HR -on coumadin. Continue Lopressor 12.5mg  BID. 11. Recent GIB/Anemia of chronic disease: Continue to monitor H/H with serial checks. On Retacrit 10,000 units with HD.     hgb/tranfusion per nephrology-hemoglobin 8.6 on 12/19 12. Loose stool: C diff  negative  -continue fiber  -increased probiotic to 500mg   TID  -diet--reinforced that consistent PO intake is important also  -12/18 stopped megace given it's lack of benefit and potential for loose stool (although was having loose stool long before this was started)  Appreciate GI recs, medication initiated, await further recs  12/22: Lomotil prn is ordered but has not been administered; will place general nursing order to administer.   12/23: Lomotil administered twice yesterday. Patient reports 10 BM yesterday. Will change order to scheduled QID.  12/24: Diarrhea resolved. Will change Lomotil back to prn. Continue metamucil which has helped stool be more formed. Appreciate pharmacy's input.  13. Cough with phlegm: Guaifenesin prn 14. Shortness of breath at rest: encourage IS, anxiety mgt. Improved 15. Leukocytosis:  -wbc's 13.4 on 12/19, 14.3 on 12/23  Afebrile  -bc negative no growth 16.  Morbid obesity: Encouraged weight loss  LOS: 19 days A FACE TO FACE  EVALUATION WAS PERFORMED  Martha Clan P Romonda Parker 04/26/2019, 11:05 AM

## 2019-04-26 NOTE — Progress Notes (Signed)
Pt refused 2pm xanax, metamucil, and florastor. Pt had ate lunch that wife brought in and pt/wife wanted food to digest for a little bit before taking meds.   Erie Noe, RN

## 2019-04-26 NOTE — Plan of Care (Signed)
  Problem: Consults Goal: RH GENERAL PATIENT EDUCATION Description: See Patient Education module for education specifics. Outcome: Progressing Goal: Skin Care Protocol Initiated - if Braden Score 18 or less Description: If consults are not indicated, leave blank or document N/A Outcome: Progressing   Problem: RH BOWEL ELIMINATION Goal: RH STG MANAGE BOWEL WITH ASSISTANCE Description: STG Manage Bowel with Mod Assistance. Outcome: Progressing   Problem: RH SKIN INTEGRITY Goal: RH STG SKIN FREE OF INFECTION/BREAKDOWN Description: No new breakdown with mod assist  Outcome: Progressing Goal: RH STG ABLE TO PERFORM INCISION/WOUND CARE W/ASSISTANCE Description: STG Able To Perform Incision/Wound Care With Max Assistance. Outcome: Progressing   Problem: RH PAIN MANAGEMENT Goal: RH STG PAIN MANAGED AT OR BELOW PT'S PAIN GOAL Description: < 3 out of 10.  Outcome: Progressing   Problem: RH BOWEL ELIMINATION Goal: RH STG MANAGE BOWEL W/MEDICATION W/ASSISTANCE Description: STG Manage Bowel with Medication with Mod Assistance. Outcome: Not Applicable

## 2019-04-26 NOTE — Progress Notes (Signed)
ANTICOAGULATION CONSULT NOTE - Follow Up Consult  Pharmacy Consult for Warfarin Indication: atrial fibrillation and St. Jude AVR (2353I)  Patient Measurements: Height: 6\' 2"  (144 cm) Weight: 287 lb 7.7 oz (130.4 kg) IBW/kg (Calculated) : 82.2  Vital Signs: Temp: 97.8 F (36.6 C) (12/26 0415) BP: 114/47 (12/26 0415) Pulse Rate: 93 (12/26 0415)  Labs: Recent Labs    04/24/19 0539 04/25/19 0456 04/26/19 0722  LABPROT 32.5* 31.2* 32.2*  INR 3.2* 3.0* 3.1*    Estimated Creatinine Clearance: 13.6 mL/min (A) (by C-G formula based on SCr of 8.09 mg/dL (H)).  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 Goal INR range should be 2.5-3.5 given indication; however, per discussion with patient, he was told to aim for 2.5 in mid 2019 by his MD due to history of GIB. Per chart review, appears that GIB events are related to procedures (polypectomy) and GI ulcers.   12/17 Received vitamin K PO 1mg  due to INR 5.4.  Note, patient does not eat any vegetables. Did not have bleeding other than small amounts from rectal wounds.  Based on this admission's warfarin doses, INRs, and narrow goal range, suspect patient needs less than 5mg  daily (possibly ~3mg  daily) for maintenance despite reported PTA warfarin dose: 5 mg daily.  INR today of 3.1 is at goal (2.5-3) after warfarin was held on 12/24. Hgb 8.4. No reported bleeding.  Goal of Therapy:  INR 2.5-3   Plan:  - INR increased today which was unexpected and concerned that will continue to rise with the 3 mg dose on 12/25 - No Warfarin tonight - Monitor INR, CBC, and S/S of bleeding daily   Duanne Limerick, PharmD. BCPS

## 2019-04-26 NOTE — Progress Notes (Signed)
Patient ID: Calvin [name], male   DOB: [birth year], 63 y.o.   MRN: [id number]  [facility] Progress Note   Assessment/ Plan:   1.  Status post stent repair of coarctation of aorta with mechanical aortic valve replacement: With significant deconditioning and ongoing inpatient rehabilitation with ongoing management of musculoskeletal pain. 2. ESRD: Currently on a Tuesday/Thursday/Saturday dialysis schedule with next hemodialysis due tomorrow to accommodate for holiday scheduling.  Patient intends to transition back to PD at some point down the road (tells me that catheter was removed for suspicion of being a nidus for infection however, cultures negative). 3. 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 5. Nutrition: Continue current renal diet with ongoing ONS/protein supplementation. 6. Hypertension: Blood pressure well controlled with intermittent hypotension at dialysis, will pretreat with albumin given that he is already on 3 times daily midodrine.  Subjective:   Reports that he had a rough last dialysis treatment due to hypotension and some nausea.   Objective:   BP (!) 114/47 (BP Location: Right Arm)   Pulse 93   Temp 97.8 F (36.6 C)   Resp 16   Ht 6\' 2"  (1.88 m)   Wt 130.4 kg   SpO2 97%   BMI 36.91 kg/m   Physical Exam: Gen: Comfortably resting in bed, working with PT CVS: Pulse regular rhythm, normal rate, mechanical S2 click audible Resp: Clear to auscultation, no rales/rhonchi.  Right IJ TDC. Abd: Soft, moderate global distention, nontender Ext: Trace lower extremity edema bilaterally  Labs: BMET Recent Labs  Lab 04/19/19 1409 04/22/19 1412  NA 135 134*  K 4.0 3.7  CL 96* 95*  CO2 23 22  GLUCOSE 106* 109*  BUN 62* 77*  CREATININE 6.70* 8.09*  CALCIUM 9.5 9.3  PHOS 3.7 3.6   CBC Recent Labs  Lab 04/19/19 1409 04/21/19 1024  04/22/19 1412  WBC 13.4* 14.1* 14.3*  HGB 8.6* 9.0* 8.4*  HCT 28.1* 29.5* 27.7*  MCV 92.4 91.9 91.4  PLT 681* 645* 617*      Medications:    . ALPRAZolam  0.5 mg Oral TID  . Chlorhexidine Gluconate Cloth  6 each Topical Q0600  . colestipol  4 g Oral BID  . darbepoetin (ARANESP) injection - DIALYSIS  200 mcg Intravenous Q Thu-HD  . feeding supplement (PRO-STAT SUGAR FREE 64)  30 mL Oral BID  . Gerhardt's butt cream   Topical QID  . metoprolol tartrate  12.5 mg Oral BID  . midodrine  10 mg Oral TID WC  . multivitamin  1 tablet Oral QHS  . pantoprazole  40 mg Oral BID  . polycarbophil  625 mg Oral TID WC  . pregabalin  25 mg Oral QHS  . pregabalin  25 mg Oral Q T,Th,Sa-HD  . psyllium  1 packet Oral TID  . saccharomyces boulardii  500 mg Oral TID  . sertraline  100 mg Oral Daily  . sevelamer carbonate  1,600 mg Oral TID WC  . Warfarin - Pharmacist Dosing Inpatient   Does not apply [id number]   Elmarie Shiley, MD 04/26/2019, 9:23 AM

## 2019-04-26 NOTE — Progress Notes (Signed)
Occupational Therapy Session Note  Patient Details  Name: Calvin [name] MRN: [id number] Date of Birth: 09/24/1955  Today's Date: 04/26/2019 OT Individual Time: 3893-7342 OT Individual Time Calculation (min): 60 min   OT Individual Time: 1100-1157 OT Individual Time Calculation (min): 57 min    Short Term Goals: Week 3:  OT Short Term Goal 1 (Week 3): Pt will remain OOB 1x per day for >30 min to improve tolerance to upright OT Short Term Goal 2 (Week 3): Pt will transfer to [facility] with MAX A of 1 OT Short Term Goal 3 (Week 3): Pt will roll with S to decrease BOC after BM  Skilled Therapeutic Interventions/Progress Updates:    Pt received supine with no c/o pain, but reporting another incontinent BM. Had extensive conversation with pt re OT POC. Discussed goals moving forward, d/c planning, schedule of therapy, and management of BM's. Edu SNF vs CIR therapy requirements and placement timeline (very broad window). +2 assistance acquired to assist with bed mobility. Pt rolled R and L with min A while +2 completed peri hygiene at bed level. Butt cream applied as instructed. Pt agreeable to BUE strengthening. OT left room to gather supplies. Upon entry (~1-2 min) pt reported he had incontinent BM again. Pt initially requesitng to not change but insisted and provided edu re skin integrity. Pt rolled R and total A for peri hygiene completed again. Pt then completed bed mobility to transition from supine > side lying > sitting EOB with mod A overall. Mod cueing for technique. Pt sat EOB and completed brief BUE strengthening circuit with a 3 # dowel before requesting to return to supine. Pt returned to supine with min A overall. With bed in trend position, pt able to pull himself up. Pt positioned in bed with pillows as requested and left supine with all needs met.    Session 2: Pt received supine with no c/o pain, reporting incontinent BM. Pt completed rolling L with mod A for brief change. Loose,  incontinent BM present. Brief donned total A. Shoes donned total A bed level. Pt rolled R with min A while bringing BLE off the side of the bed. Pt with increased ease in moving BLE off with shoes donned 2/2 increased grip. Pt completed sidelying > EOB with mod A. Pt sat EOB while the SARA was positioned. Pt completed 3 consecutive sit <> stands with use of the sara from EOB. Rest breaks provided between each and cueing for increased weight bearing through the BLE, as well as for upright posture in standing. Pt c/o increasing fatigue and requested to return to supine. Pt required mod A to lift BLE into bed. With bed in trend position, pt able to pull himself up. Pt requested to update his weekly goals sheet.  A new sheet was posted on the wall and pt collaborated with OT in creating new weekly goals, including standing with the sara vs. The steady and getting OOB 1x a day minimum. Pt positioned in bed with pillows as requested and left supine with all needs met.    Therapy Documentation Precautions:  Precautions Precautions: Fall Precaution Comments: monitor HR, L heel wound Restrictions Weight Bearing Restrictions: No   Therapy/Group: Individual Therapy  Curtis Sites 04/26/2019, 7:00 AM

## 2019-04-27 ENCOUNTER — Inpatient Hospital Stay (HOSPITAL_COMMUNITY): Payer: Medicare Other | Admitting: Physical Therapy

## 2019-04-27 ENCOUNTER — Inpatient Hospital Stay (HOSPITAL_COMMUNITY): Payer: Medicare Other | Admitting: Occupational Therapy

## 2019-04-27 LAB — CBC
HCT: 26.5 % — ABNORMAL LOW (ref 39.0–52.0)
Hemoglobin: 8 g/dL — ABNORMAL LOW (ref 13.0–17.0)
MCH: 27.3 pg (ref 26.0–34.0)
MCHC: 30.2 g/dL (ref 30.0–36.0)
MCV: 90.4 fL (ref 80.0–100.0)
Platelets: 564 10*3/uL — ABNORMAL HIGH (ref 150–400)
RBC: 2.93 MIL/uL — ABNORMAL LOW (ref 4.22–5.81)
RDW: 17.5 % — ABNORMAL HIGH (ref 11.5–15.5)
WBC: 15.2 10*3/uL — ABNORMAL HIGH (ref 4.0–10.5)
nRBC: 0 % (ref 0.0–0.2)

## 2019-04-27 LAB — RENAL FUNCTION PANEL
Albumin: 2.3 g/dL — ABNORMAL LOW (ref 3.5–5.0)
Anion gap: 14 (ref 5–15)
BUN: 32 mg/dL — ABNORMAL HIGH (ref 8–23)
CO2: 25 mmol/L (ref 22–32)
Calcium: 8.4 mg/dL — ABNORMAL LOW (ref 8.9–10.3)
Chloride: 95 mmol/L — ABNORMAL LOW (ref 98–111)
Creatinine, Ser: 4.36 mg/dL — ABNORMAL HIGH (ref 0.61–1.24)
GFR calc Af Amer: 16 mL/min — ABNORMAL LOW (ref >60)
GFR calc non Af Amer: 14 mL/min — ABNORMAL LOW (ref >60)
Glucose, Bld: 104 mg/dL — ABNORMAL HIGH (ref 70–99)
Phosphorus: 1.8 mg/dL — ABNORMAL LOW (ref 2.5–4.6)
Potassium: 3.4 mmol/L — ABNORMAL LOW (ref 3.5–5.1)
Sodium: 134 mmol/L — ABNORMAL LOW (ref 135–145)

## 2019-04-27 LAB — PROTIME-INR
INR: 3.4 — ABNORMAL HIGH (ref 0.8–1.2)
Prothrombin Time: 34.7 seconds — ABNORMAL HIGH (ref 11.4–15.2)

## 2019-04-27 MED ORDER — ALTEPLASE 2 MG IJ SOLR: 2.0000 mg | Freq: Once | INTRAMUSCULAR | Status: DC | PRN

## 2019-04-27 MED ORDER — HEPARIN SODIUM (PORCINE) 1000 UNIT/ML DIALYSIS
1000.0000 [IU] | INTRAMUSCULAR | Status: DC | PRN
  Filled 2019-04-27: qty 1

## 2019-04-27 MED ORDER — SODIUM CHLORIDE 0.9 % IV SOLN: 100.0000 mL | INTRAVENOUS | Status: DC | PRN

## 2019-04-27 MED ORDER — DARBEPOETIN ALFA 200 MCG/0.4ML IJ SOSY
PREFILLED_SYRINGE | INTRAMUSCULAR | Status: AC
  Administered 2019-04-27: 200 ug via INTRAVENOUS
  Filled 2019-04-27: qty 0.4

## 2019-04-27 MED ORDER — LIDOCAINE-PRILOCAINE 2.5-2.5 % EX CREA
1.0000 "application " | TOPICAL_CREAM | CUTANEOUS | Status: DC | PRN
  Filled 2019-04-27: qty 5

## 2019-04-27 MED ORDER — DARBEPOETIN ALFA 200 MCG/0.4ML IJ SOSY: 200.0000 ug | PREFILLED_SYRINGE | INTRAMUSCULAR | Status: DC

## 2019-04-27 MED ORDER — PENTAFLUOROPROP-TETRAFLUOROETH EX AERO: 1.0000 "application " | INHALATION_SPRAY | CUTANEOUS | Status: DC | PRN

## 2019-04-27 MED ORDER — ALBUMIN HUMAN 25 % IV SOLN
INTRAVENOUS | Status: AC
  Administered 2019-04-27: 14:00:00 25 g
  Filled 2019-04-27: qty 100

## 2019-04-27 MED ORDER — HEPARIN SODIUM (PORCINE) 1000 UNIT/ML DIALYSIS
40.0000 [IU]/kg | INTRAMUSCULAR | Status: DC | PRN
  Filled 2019-04-27 (×2): qty 6

## 2019-04-27 MED ORDER — NYSTATIN 100000 UNIT/GM EX CREA
TOPICAL_CREAM | Freq: Two times a day (BID) | CUTANEOUS | Status: DC
  Administered 2019-04-28 – 2019-05-07 (×3): 1 via TOPICAL
  Filled 2019-04-27 (×3): qty 15

## 2019-04-27 MED ORDER — LIDOCAINE HCL (PF) 1 % IJ SOLN
5.0000 mL | INTRAMUSCULAR | Status: DC | PRN
  Filled 2019-04-27: qty 5

## 2019-04-27 MED ORDER — HEPARIN SODIUM (PORCINE) 1000 UNIT/ML IJ SOLN
INTRAMUSCULAR | Status: AC
  Administered 2019-04-27: 6000 [IU] via INTRAVENOUS_CENTRAL
  Filled 2019-04-27: qty 11

## 2019-04-27 NOTE — Progress Notes (Signed)
ANTICOAGULATION CONSULT NOTE - Follow Up Consult  Pharmacy Consult for Warfarin Indication: atrial fibrillation and St. Jude AVR (1610R)  Patient Measurements: Height: 6\' 2"  (188 cm) Weight: 288 lb 5.8 oz (130.8 kg) IBW/kg (Calculated) : 82.2  Vital Signs: Temp: 97.7 F (36.5 C) (12/27 0506) BP: 132/50 (12/27 0506) Pulse Rate: 90 (12/27 0506)  Labs: Recent Labs    04/25/19 0456 04/26/19 0722 04/27/19 0645  LABPROT 31.2* 32.2* 34.7*  INR 3.0* 3.1* 3.4*    Estimated Creatinine Clearance: 13.6 mL/min (A) (by C-G formula based on SCr of 8.09 mg/dL (H)).  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 Goal INR range should be 2.5-3.5 given indication; however, per discussion with patient, he was told to aim for 2.5 in mid 2019 by his MD due to history of GIB. Per chart review, appears that GIB events are related to procedures (polypectomy) and GI ulcers.   12/17 Received vitamin K PO 1mg  due to INR 5.4.  Note, patient does not eat any vegetables. Did not have bleeding other than small amounts from rectal wounds.  Based on this admission's warfarin doses, INRs, and narrow goal range, suspect patient needs less than 5mg  daily (possibly ~3mg  daily) for maintenance despite reported PTA warfarin dose: 5 mg daily.  INR today increased from 3.1>3.4 goal (2.5-3) after warfarin was held on 12/24 and 12/26. Most recent Hgb 8.4, with reported mild bleeding with frequent stools. Will order CBC with tomorrows INR.  Goal of Therapy:   INR 2.5-3   Plan:  - HOLD warfarin tonight - Monitor INR, CBC, and S/S of bleeding daily   Georga Bora, PharmD Clinical Pharmacist 04/27/2019 8:46 AM Please check AMION for all [facility] numbers

## 2019-04-27 NOTE — Progress Notes (Signed)
[facility] PHYSICAL MEDICINE & REHABILITATION PROGRESS NOTE   Subjective/Complaints: Patient seen sitting up in bed this morning.  Continues to have buttock soreness. Diarrhea improved/resolved Sleeping well at night.  Hopes he will be able to stay here longer   ROS: + Sore buttock, improving. Denies CP, SOB, N/V/D   Objective:   No results found. No results for input(s): WBC, HGB, HCT, PLT in the last 72 hours. No results for input(s): NA, K, CL, CO2, GLUCOSE, BUN, CREATININE, CALCIUM in the last 72 hours.  Intake/Output Summary (Last 24 hours) at 04/27/2019 1352 Last data filed at 04/27/2019 0839 Gross per 24 hour  Intake 600 ml  Output --  Net 600 ml     Physical Exam: Vital Signs Blood pressure (!) 132/50, pulse 90, temperature 97.7 F (36.5 C), resp. rate 17, height 6\' 2"  (1.88 m), weight 130.8 kg, SpO2 98 %. Constitutional: No distress . Vital signs reviewed.  Morbidly obese. Sitting up in chair.  HENT: Normocephalic.  Atraumatic. Eyes: EOMI. No discharge. Cardiovascular: No JVD. Respiratory: Normal effort.  No stridor. GI: Non-distended. Skin: Warm and dry.  Intact. Psych: Flat affect. Musc: Lower extremity edema Neurological: Alert Motor: Bilateral upper extremities: 4/5 proximal distal Right lower extremity: 3/5 proximal distal Left lower extremity: 3/5 proximal to distal  Assessment/Plan: 1. Functional deficits secondary to debility which require 3+ hours per day of interdisciplinary therapy in a comprehensive inpatient rehab setting.  Physiatrist is providing close team supervision and 24 hour management of active medical problems listed below.  Physiatrist and rehab team continue to assess barriers to discharge/monitor patient progress toward functional and medical goals  Care Tool:  Bathing  Bathing activity did not occur: Refused Body parts bathed by patient: Front perineal area, Buttocks         Bathing assist Assist Level: 2 Helpers      Upper Body Dressing/Undressing Upper body dressing   What is the patient wearing?: Pull over shirt    Upper body assist Assist Level: Supervision/Verbal cueing    Lower Body Dressing/Undressing Lower body dressing      What is the patient wearing?: Pants, Incontinence brief     Lower body assist Assist for lower body dressing: Maximal Assistance - Patient 25 - 49%     Toileting Toileting    Toileting assist Assist for toileting: 2 Helpers     Transfers Chair/bed transfer  Transfers assist  Chair/bed transfer activity did not occur: Safety/medical concerns  Chair/bed transfer assist level: 2 Helpers     Locomotion Ambulation   Ambulation assist   Ambulation activity did not occur: Safety/medical concerns          Walk 10 feet activity   Assist  Walk 10 feet activity did not occur: Safety/medical concerns        Walk 50 feet activity   Assist Walk 50 feet with 2 turns activity did not occur: Safety/medical concerns         Walk 150 feet activity   Assist Walk 150 feet activity did not occur: Safety/medical concerns         Walk 10 feet on uneven surface  activity   Assist Walk 10 feet on uneven surfaces activity did not occur: Safety/medical concerns         Wheelchair     Assist Will patient use wheelchair at discharge?: Yes Type of Wheelchair: Manual Wheelchair activity did not occur: Safety/medical concerns  Wheelchair assist level: Minimal Assistance - Patient > 75% Max wheelchair distance:  5'    Wheelchair 50 feet with 2 turns activity    Assist    Wheelchair 50 feet with 2 turns activity did not occur: Safety/medical concerns       Wheelchair 150 feet activity     Assist  Wheelchair 150 feet activity did not occur: Safety/medical concerns       Blood pressure (!) 132/50, pulse 90, temperature 97.7 F (36.5 C), resp. rate 17, height 6\' 2"  (1.88 m), weight 130.8 kg, SpO2 98 %.  Medical Problem  List and Plan: 1.  Impaired mobility and ADLs secondary to debility   Continue CIR  2.  St Jude's Aortic Valve/Antithrombotics: -DVT/anticoagulation:  Pharmaceutical: Coumadin  INR subtherapeutic on 12/20, discussed with pharmacy, patient refused heparin gtt.  12/21: INR 2.4  12/22: INR 2.8  12/23: INR 3.1  12/24: INR 3.2  12/25: INR 3.0  12/26: INR 3.1             -antiplatelet therapy: N/A 3. Pain Management: Oxycodone prn  -resumed lyrica at hs only 25mg , added additional 25 mg postdialysis 4. Mood: LCSW to follow for evaluation and support.              -antipsychotic agents: N/A  -anxiety disorder/ recent MDD   - increased xanax back to 0.5mg ,    -continue to provide positive reinforcement. Trying to empower him to take some initiative and accountability for his care and progress  Zoloft increased to home dose of 100 mg on 12/20 5. Neuropsych: This patient is capable of making decisions on his own behalf. 6. Skin/Wound Care: Air mattress overlay for MASD due to ongoing diarrhea.    -gerhardt's cream to buttocks  -rx loose stools  -prevalon boots bilateral heels 7. Fluids/Electrolytes/Nutrition: Strict I/O. Renal diet with 1200 cc FR.  8.   ESRD: Now on HD. Schedule hemodialysis at the end of the day to help with therapy tolerance. TTS schedule   12/10: diet liberalized to help with intake  -volume/edema mgt per nephrology   [facility] Weights   04/25/19 0413 04/26/19 0432 04/27/19 0506  Weight: 129.5 kg 130.4 kg 130.8 kg    Stable on 12/20, 12/21, 12/22, 12/25, 12/26 9. Hypotension: Monitor BP tid--continue midodrine.  Relatively controlled and asymptomatic on 12/20, 12/24, 12/25 10 History of A fib/A flutter: Monitor HR -on coumadin. Continue Lopressor 12.5mg  BID. 11. Recent GIB/Anemia of chronic disease: Continue to monitor H/H with serial checks. On Retacrit 10,000 units with HD.     hgb/tranfusion per nephrology-hemoglobin 8.6 on 12/19 12. Loose stool: C diff  negative  -continue fiber  -increased probiotic to 500mg   TID  -diet--reinforced that consistent PO intake is important also  -12/18 stopped megace given it's lack of benefit and potential for loose stool (although was having loose stool long before this was started)  Appreciate GI recs, medication initiated, await further recs  12/22: Lomotil prn is ordered but has not been administered; will place general nursing order to administer.   12/23: Lomotil administered twice yesterday. Patient reports 10 BM yesterday. Will change order to scheduled QID.  12/24: Diarrhea resolved. Will change Lomotil back to prn. Continue metamucil which has helped stool be more formed. Appreciate pharmacy's input.   12/27: Increased colestipol frequency upon discussion with patient and wife. 13. Cough with phlegm: Guaifenesin prn 14. Shortness of breath at rest: encourage IS, anxiety mgt. Improved 15. Leukocytosis:  -wbc's 13.4 on 12/19, 14.3 on 12/23  Afebrile  -bc negative no growth 16.  Morbid obesity:  Encouraged weight loss 17. Disposition: Patient is scheduled for DC on 12/29. He and wife would prefer longer stay in order to return him safely home without SNF stay. Wife states he would need to ambulate prior to returning home and I discussed that based on current progress with therapy he is requiring total assist x2 to perform sit to stands and unlikely to ambulate prior to discharge. Agree that patient's progress was deterred due to management of diarrhea which is now under better control. PT goals do not currently include ambulation. Wife has been actively participating in patient's care including bowel care. If she is willing to take him home with WC mobility goal, this may be more reasonable.   LOS: 20 days A FACE TO FACE EVALUATION WAS PERFORMED  Martha Clan P Iyah Laguna 04/27/2019, 1:52 PM

## 2019-04-27 NOTE — Progress Notes (Signed)
Spouse would like to consider new plan for the management of skin breakdown in gluteal folds. Requesting nystatin cream applied, then allow to dry, then use "bag balm"  - product provided by family. Requesting order for nystatin cream

## 2019-04-27 NOTE — Progress Notes (Signed)
Occupational Therapy Session Note  Patient Details  Name: Calvin [name] MRN: [id number] Date of Birth: 1956-03-27  Today's Date: 04/27/2019 OT Individual Time: 1100-1159 OT Individual Time Calculation (min): 59 min   Skilled Therapeutic Interventions/Progress Updates:    Pt greeted in bed, reporting feeling fatigued post PT session but proud that he stood in the [place]. Strongly encouraged him to sit EOB during OT however pt declined, requesting for bedlevel therapy due to fatigue. With [facility] raised, guided pt through gentle UB stretches with yoga-influence as a preparatory activity, education emphasis placed on coordinating breath with movement. Next instructed him on AAROM exercises for Lt using the unweighted dowel rod. Pt needed mod facilitation for Lt UE to complete exercises, noted increased difficultly and time with straight arm raises and chest presses. Pt then completed UE therex using 3# dumbbell with the Rt and 1# for the Lt. Instructed pt through exercises with 10-15 reps each with Rt and 5 reps 2 sets each with Lt. Discussed simple and small ROM/stretching activities that he can complete when lying in bed or sitting in w/c outside of therapies for strength building. He seemed receptive to this education. At end of session pt remained in bed with all needs within reach and bed alarm set.     Therapy Documentation Precautions:  Precautions Precautions: Fall Precaution Comments: monitor HR, L heel wound Restrictions Weight Bearing Restrictions: No Pain: Pt reported buttocks soreness but otherwise denied pain during session   ADL: ADL Eating: Supervision/safety Where Assessed-Eating: Bed level Grooming: Supervision/safety Where Assessed-Grooming: Bed level Upper Body Bathing: Moderate assistance Where Assessed-Upper Body Bathing: Bed level Lower Body Bathing: Dependent Where Assessed-Lower Body Bathing: Bed level Upper Body Dressing: Maximal assistance Where Assessed-Upper Body  Dressing: Edge of bed Lower Body Dressing: Dependent Where Assessed-Lower Body Dressing: Bed level Toileting: Dependent Where Assessed-Toileting: Bed level Toilet Transfer: Unable to assess      Therapy/Group: Individual Therapy  Calvin [name] 04/27/2019, 12:25 PM

## 2019-04-27 NOTE — Progress Notes (Signed)
Physical Therapy Session Note  Patient Details  Name: Calvin [name] MRN: [id number] Date of Birth: 1956/04/01  Today's Date: 04/27/2019 PT Individual Time: 1000-1050 PT Individual Time Calculation (min): 50 min   Short Term Goals: Week 3:  PT Short Term Goal 1 (Week 3): Pt will perform sit<>stand w/ max assist +1 PT Short Term Goal 2 (Week 3): Pt will tolerate sitting up and OOB x3 hrs/day PT Short Term Goal 3 (Week 3): Pt will self-propel manual w/c x 25' w/ supervision PT Short Term Goal 4 (Week 3): Pt will maintain upright sitting at EOB w/ supervision x10 min consistently  Skilled Therapeutic Interventions/Progress Updates:   Pt in supine and agreeable to therapy, denies pain, ongoing soreness at site of wounds in gluteal fold. Pt reports he may have had a BM. R/L rolling w/ mod assist and tactile/verbal cues for technique while therapist performed pericare and donned new brief. Also applied A&D ointment and Gerhardt's butt cream. Pt's wounds actively bleeding, did not wipe but "patted" stool away to avoid further skin breakdown. Donned shorts total assist as well during rolling. Discussed benefit of maintaining longer stance in [place] vs achieving a certain number of standing reps, pt in agreement w/ plan. Sit>supine w/ mod assist and verbal/tactile cues for technique. Worked on standing tolerance w/ Clarise Cruz lift, sit<>stands w/ total assist x2. Stood 30 sec and then 15 sec w/ mild increase in work of breathing. Tactile and verbal cues for glut activation to reach full stance and for upright posture. Able to reach full stance, but pt leaning onto lift, unable to reach full trunk upright. Pt able to maintain static sitting for 3-4 more minutes after standing, therapist performed passive BLE stretching into knee extension, lacking 45-60 deg of knee extension in seated position. Pt requesting to return to supine 2/2 fatigue. Total assist to reposition in bed for pressure relief and comfort. Applied  bolster and pillows underneath distal lower leg to promote knee extension stretch in supine. Applied blue theraband around feet to assist w/ maintaining neutral rotation/alignment. Ended session resting in supine, all needs in reach. Missed 10 min of skilled PT 2/2 fatigue.   Ended session in supine, all needs in reach.   Therapy Documentation Precautions:  Precautions Precautions: Fall Precaution Comments: monitor HR, L heel wound Restrictions Weight Bearing Restrictions: No Therapy/Group: Individual Therapy  Marya Lowden K Marico Buckle 04/27/2019, 11:21 AM

## 2019-04-27 NOTE — Procedures (Signed)
Patient seen on Hemodialysis. BP (!) 132/50 (BP Location: Right Arm)   Pulse 90   Temp 97.7 F (36.5 C)   Resp 17   Ht 6\' 2"  (1.88 m)   Wt 130.8 kg   SpO2 98%   BMI 37.02 kg/m   QB 400, UF goal 3.2L Tolerating treatment without complaints at this time.   Elmarie Shiley MD [facility]. Office # [phone] Pager # [phone] 2:04 PM

## 2019-04-27 NOTE — Progress Notes (Signed)
Pt sleeping on and off. Calls out frequently for changing, only having very small loose stools, not liquid. Buttock is bleeding from frequent cleaning/stooling. Continue to use barrier creams

## 2019-04-27 NOTE — Progress Notes (Signed)
Patient ID: Calvin [name], male   DOB: 1955/10/20, 63 y.o.   MRN: [id number]  [facility] Progress Note   Assessment/ Plan:   1.  Status post stent repair of coarctation of aorta with mechanical aortic valve replacement: With significant deconditioning and reports to be making slow but gradual progress with inpatient rehabilitation with ongoing management of musculoskeletal pain. 2. ESRD: Currently on a Tuesday/Thursday/Saturday dialysis schedule with hemodialysis later today to accommodate for holiday schedule.  He would like to transition back to peritoneal dialysis at some point as an outpatient, he will continue these discussions with his outpatient nephrologist (Dr. Olivia Mackie). 3. Anemia: Without overt loss, continue high-dose ESA with next dose today (missed 12/24). 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 5. Nutrition: Continue current renal diet with ongoing ONS/protein supplementation. 6. Hypertension: He appears to have well-controlled blood pressures with intradialytic hypotension, remains on midodrine and will get albumin at start of dialysis.  Subjective:   Expresses some anxiety and concern about hemodialysis today due to previous hypotension/symptoms.  Denies chest pain or shortness of breath.   Objective:   BP (!) 132/50 (BP Location: Right Arm)   Pulse 90   Temp 97.7 F (36.5 C)   Resp 17   Ht 6\' 2"  (1.88 m)   Wt 130.8 kg   SpO2 98%   BMI 37.02 kg/m   Physical Exam: Gen: Appears to be comfortable resting in bed CVS: Pulse regular rhythm, normal rate, mechanical S2 click audible Resp: Clear to auscultation, no rales/rhonchi.  Right IJ TDC. Abd: Soft, moderate global distention, nontender Ext: With trace ankle edema bilaterally  Labs: BMET Recent Labs  Lab 04/22/19 1412  NA 134*  K 3.7  CL 95*  CO2 22  GLUCOSE 109*  BUN 77*  CREATININE 8.09*  CALCIUM 9.3  PHOS 3.6    CBC Recent Labs  Lab 04/21/19 1024 04/22/19 1412  WBC 14.1* 14.3*  HGB 9.0* 8.4*  HCT 29.5* 27.7*  MCV 91.9 91.4  PLT 645* 617*      Medications:    . ALPRAZolam  0.5 mg Oral TID  . Chlorhexidine Gluconate Cloth  6 each Topical Q0600  . colestipol  4 g Oral TID  . darbepoetin (ARANESP) injection - DIALYSIS  200 mcg Intravenous Q Sat-HD  . feeding supplement (PRO-STAT SUGAR FREE 64)  30 mL Oral BID  . Gerhardt's butt cream   Topical QID  . metoprolol tartrate  12.5 mg Oral BID  . midodrine  10 mg Oral TID WC  . multivitamin  1 tablet Oral QHS  . pantoprazole  40 mg Oral BID  . polycarbophil  625 mg Oral TID WC  . pregabalin  25 mg Oral QHS  . pregabalin  25 mg Oral Q T,Th,Sa-HD  . psyllium  1 packet Oral TID  . saccharomyces boulardii  500 mg Oral TID  . sertraline  100 mg Oral Daily  . sevelamer carbonate  1,600 mg Oral TID WC  . Warfarin - Pharmacist Dosing Inpatient   Does not apply [id number]   Elmarie Shiley, MD 04/27/2019, 9:05 AM

## 2019-04-28 ENCOUNTER — Inpatient Hospital Stay (HOSPITAL_COMMUNITY): Payer: Medicare Other

## 2019-04-28 ENCOUNTER — Inpatient Hospital Stay (HOSPITAL_COMMUNITY): Payer: Medicare Other | Admitting: Physical Therapy

## 2019-04-28 DIAGNOSIS — K529 Noninfective gastroenteritis and colitis, unspecified: Secondary | ICD-10-CM

## 2019-04-28 LAB — CBC
HCT: 27.8 % — ABNORMAL LOW (ref 39.0–52.0)
Hemoglobin: 8.3 g/dL — ABNORMAL LOW (ref 13.0–17.0)
MCH: 27 pg (ref 26.0–34.0)
MCHC: 29.9 g/dL — ABNORMAL LOW (ref 30.0–36.0)
MCV: 90.6 fL (ref 80.0–100.0)
Platelets: 600 10*3/uL — ABNORMAL HIGH (ref 150–400)
RBC: 3.07 MIL/uL — ABNORMAL LOW (ref 4.22–5.81)
RDW: 17.4 % — ABNORMAL HIGH (ref 11.5–15.5)
WBC: 13.5 10*3/uL — ABNORMAL HIGH (ref 4.0–10.5)
nRBC: 0 % (ref 0.0–0.2)

## 2019-04-28 LAB — PROTIME-INR
INR: 3.1 — ABNORMAL HIGH (ref 0.8–1.2)
Prothrombin Time: 32.2 seconds — ABNORMAL HIGH (ref 11.4–15.2)

## 2019-04-28 MED ORDER — WARFARIN SODIUM 1 MG PO TABS
1.0000 mg | ORAL_TABLET | Freq: Once | ORAL | Status: AC
  Administered 2019-04-28: 1 mg via ORAL
  Filled 2019-04-28: qty 1

## 2019-04-28 MED ORDER — CHLORHEXIDINE GLUCONATE CLOTH 2 % EX PADS
6.0000 | MEDICATED_PAD | Freq: Every day | CUTANEOUS | Status: DC
  Administered 2019-04-30 – 2019-05-01 (×2): 6 via TOPICAL

## 2019-04-28 NOTE — Progress Notes (Signed)
ANTICOAGULATION CONSULT NOTE - Follow Up Consult  Pharmacy Consult for Warfarin Indication: atrial fibrillation and St. Jude AVR (8502D)  Patient Measurements: Height: 6\' 2"  (188 cm) Weight: 285 lb 11.5 oz (129.6 kg) IBW/kg (Calculated) : 82.2  Vital Signs: Temp: 98.1 F (36.7 C) (12/28 0555) BP: 103/53 (12/28 0555) Pulse Rate: 90 (12/28 0555)  Labs: Recent Labs    04/26/19 0722 04/27/19 0645 04/27/19 1515 04/28/19 0840  HGB  --   --  8.0* 8.3*  HCT  --   --  26.5* 27.8*  PLT  --   --  564* 600*  LABPROT 32.2* 34.7*  --  32.2*  INR 3.1* 3.4*  --  3.1*  CREATININE  --   --  4.36*  --     Estimated Creatinine Clearance: 25.1 mL/min (A) (by C-G formula based on SCr of 4.36 mg/dL (H)).  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 Goal INR range should be 2.5-3.5 given indication; however, per discussion with patient, he was told to aim for 2.5 in mid 2019 by his MD due to history of GIB. Per chart review, appears that GIB events are related to procedures (polypectomy) and GI ulcers.   12/17 Received vitamin K PO 1mg  due to INR 5.4.  Note, patient does not eat any vegetables. Did not have bleeding other than small amounts from rectal wounds.  Based on this admission's warfarin doses, INRs, and narrow goal range, suspect patient needs less than 5mg  daily (possibly ~1mg  daily) for maintenance despite reported PTA warfarin dose: 5 mg daily.  INR today 3.1 goal (2.5-3) after holding warfarin x2 d. H/H & plt stable.  Goal of Therapy:   INR 2.5-3   Plan:  - Warfarin 1mg  tonight - Monitor daily INR, Q72hr CBC, and S/S of bleeding   Benetta Spar, PharmD, BCPS, BCCP Clinical Pharmacist  Please check AMION for all [place] phone numbers After 10:00 PM, call [place] [phone]

## 2019-04-28 NOTE — Progress Notes (Signed)
Patient ID: Calvin [name], male   DOB: 03/06/1956, 63 y.o.   MRN: [id number]  [facility] Progress Note   Assessment/ Plan:   1.  S/P mechanical aortic valve replacement w/ stent repair of coarctation of aorta: With significant deconditioning and reports to be making slow but gradual progress with inpatient rehabilitation with ongoing management of musculoskeletal pain. 2. ESRD: cont TTS HD schedule. Had HD yest on holiday schedule, next HD tomorrow.  He would like to transition back to peritoneal dialysis at some point as an outpatient, he will continue these discussions with his outpatient nephrologist (Dr. Olivia Mackie). 3. Anemia: Without overt loss, continue high-dose ESA with next dose today (missed 12/24). 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 5. Nutrition: Continue current renal diet with ongoing ONS/protein supplementation. 6. Hypotension/ volume: on midodrine 10 tid and metop 12.5 bid.  10kg down from admit wt and euvolemic on exam now, LE edema resolved. C/O nausea on HD and orthostatic symptoms, will hold UF for now on HD as vol excess has resolved.   Subjective:   C/o nausea on HD and lightheaded sitting up, +thirsty.    Objective:   BP (!) 103/53 (BP Location: Right Arm)   Pulse 90   Temp 98.1 F (36.7 C)   Resp 18   Ht 6\' 2"  (1.88 m)   Wt 129.6 kg   SpO2 98%   BMI 36.68 kg/m   Physical Exam: Gen: Appears to be comfortable resting in bed CVS: Pulse regular rhythm, normal rate, mechanical S2 click audible Resp: Clear to auscultation, no rales/rhonchi.  Right IJ TDC. Abd: Soft, moderate global distention, nontender Ext: With trace ankle edema bilaterally  Labs: BMET Recent Labs  Lab 04/22/19 1412 04/27/19 1515  NA 134* 134*  K 3.7 3.4*  CL 95* 95*  CO2 22 25  GLUCOSE 109* 104*  BUN 77* 32*  CREATININE 8.09* 4.36*  CALCIUM 9.3 8.4*  PHOS 3.6 1.8*   CBC Recent Labs  Lab  04/21/19 1024 04/22/19 1412 04/27/19 1515 04/28/19 0840  WBC 14.1* 14.3* 15.2* 13.5*  HGB 9.0* 8.4* 8.0* 8.3*  HCT 29.5* 27.7* 26.5* 27.8*  MCV 91.9 91.4 90.4 90.6  PLT 645* 617* 564* 600*      Medications:    . ALPRAZolam  0.5 mg Oral TID  . Chlorhexidine Gluconate Cloth  6 each Topical Q0600  . colestipol  4 g Oral TID  . darbepoetin (ARANESP) injection - DIALYSIS  200 mcg Intravenous Q Sat-HD  . feeding supplement (PRO-STAT SUGAR FREE 64)  30 mL Oral BID  . Gerhardt's butt cream   Topical QID  . metoprolol tartrate  12.5 mg Oral BID  . midodrine  10 mg Oral TID WC  . multivitamin  1 tablet Oral QHS  . nystatin cream   Topical BID  . pantoprazole  40 mg Oral BID  . polycarbophil  625 mg Oral TID WC  . pregabalin  25 mg Oral QHS  . pregabalin  25 mg Oral Q T,Th,Sa-HD  . psyllium  1 packet Oral TID  . saccharomyces boulardii  500 mg Oral TID  . sertraline  100 mg Oral Daily  . sevelamer carbonate  1,600 mg Oral TID WC  . Warfarin - Pharmacist Dosing Inpatient   Does not apply [id number]   Elmarie Shiley, MD 04/28/2019, 10:14 AM

## 2019-04-28 NOTE — Progress Notes (Signed)
Occupational Therapy Session Note  Patient Details  Name: Calvin [name] MRN: [id number] Date of Birth: 1955-06-08  Today's Date: 04/28/2019 OT Individual Time: 5176-1607 OT Individual Time Calculation (min): 25 min   Session 2: OT Individual Time: 1515-1540 OT Individual Time Calculation (min): 25 min  Total Time Missed: 40 min  Short Term Goals: Week 3:  OT Short Term Goal 1 (Week 3): Pt will remain OOB 1x per day for >30 min to improve tolerance to upright OT Short Term Goal 2 (Week 3): Pt will transfer to [facility] with MAX A of 1 OT Short Term Goal 3 (Week 3): Pt will roll with S to decrease BOC after BM  Skilled Therapeutic Interventions/Progress Updates:    Pt received supine, nursing providing care. Initial 20 min missed 2/2 to this care. Discussed d/c again with pt. Pt agreeable to bed level BUE strengthening from bed level for time management. Pt used 3 lb dumbbell in the R hand and a level 1 resistance band in the L for resistance. Edu pt on strength training principles. Min-mod facilitation required at the LUE especially in shoulder flexion. Pt left supine with all needs met, bed alarm set. 20 min skilled OT missed.   Session 2: Pt received supine with no c/o pain. Pt's wife Calvin [name] present. Discussed dialysis schedule, medication administration, and OT POC. Pt c/o his stomach hurting and nausea, he believes to be a result of too many medications at once and dehydration. Encouraged increased PO liquid intake but pt declined at the moment. Pt initially agreeable to working on LUE in bed, with edu provided re shoulder anatomy and potential mechanism of injury. RN administered anti nausea medication. Pt reporting his nausea too high to participate and requested to end rest of session. 20 min skilled OT missed.    Therapy Documentation Precautions:  Precautions Precautions: Fall Precaution Comments: monitor HR, L heel wound Restrictions Weight Bearing Restrictions: No       Therapy/Group: Individual Therapy  Curtis Sites 04/28/2019, 6:55 AM

## 2019-04-28 NOTE — Progress Notes (Signed)
Nutrition Follow-up  DOCUMENTATION CODES:   Obesity unspecified  INTERVENTION:   - d/c Pro-stat due to concern that it is contributing to diarrhea  - Wife bringing in home Liquacel protein modular, continue due patient preference (provides 30 grams protein daily)  - Continue double protein portions TID with meals  - Continue renal MVI daily  - Continue liberalized Regular diet to promote PO intake  NUTRITION DIAGNOSIS:   Increased nutrient needs related to acute illness, chronic illness (peritonitis) as evidenced by estimated needs.  Ongoing, being addressed via supplements  GOAL:   Patient will meet greater than or equal to 90% of their needs  Progressing  MONITOR:   PO intake, Diet advancement, Supplement acceptance, Labs, Weight trends  REASON FOR ASSESSMENT:   Other (ESRD on PD)    ASSESSMENT:   63 year old patient with recent admission 10/21-11/1 for sepsis due to peritonitis. Pt was readmitted to [facility] 11/11-11/24 for weakness, diarrhea and issues with PD catheter. PD cath was removed and pt transitioned to HD on Tuesday, Thursday and Saturday. PMH of congenital heart disease, HTN, GERD, anxiety, depression and colon polyps. Pt admitted to CIR for significant debility.  Last HD on 12/27 with 2285 ml net UF. Post-HD weight 129.6 kg.  Weight down 34 lbs since admit. Discussed with pt who suspects that he is having "too much fluid pulled" at dialysis sessions. Pt believes this to be the case due to hypotension during HD and dizziness afterwards.  Spoke with pt at bedside. Pt very focused on making sure he is getting enough protein. Pt reports that he has not been taking the Pro-stat for the last 2 days due to concern it was contributing to diarrhea. Pt reports diarrhea has improved during this time. Pt's wife still bringing in protein supplement from home.  Pt reports that he is not receiving double protein portions at meals. RD to address.  Pt  states that his wife is occasionally bringing in food from home or from restaurants. For example, she may bring in a [place] sub for him for lunch or a late dinner. Pt reports that this helps with variety.  Discussed regular diet with pt and encouraged pt not to restrict certain food groups. Discussed the importance of adequate kcal and protein in maintaining lean muscle mass, promoting wound healing, and preventing additional weight loss.  Meal Completion: 25-100% x last 8 recorded meals (averaging 76%)  Medications reviewed and include: colestipol, rena-vit, protonix, Fibercon, psyllium, Florastor, Renvela, warfarin  Labs reviewed: sodium 134, potassium 3.4, chloride 95, phosphorus 1.8, hemoglobin 8.3  Diet Order:   Diet Order            Diet regular Room service appropriate? Yes; Fluid consistency: Thin; Fluid restriction: 1500 mL Fluid  Diet effective now              EDUCATION NEEDS:   Not appropriate for education at this time  Skin:  Skin Assessment: Skin Integrity Issues: Stage II: right heel Unstageable: left heel Other: non-pressure wound to buttocks  Last BM:  04/28/19 type 5  Height:   Ht Readings from Last 1 Encounters:  04/18/19 6\' 2"  (1.88 m)    Weight:   Wt Readings from Last 1 Encounters:  04/27/19 129.6 kg    Ideal Body Weight:  86.4 kg  BMI:  Body mass index is 36.68 kg/m.  Estimated Nutritional Needs:   Kcal:  2600-2800  Protein:  150-175  Fluid:  1030ml + UOP  Kate Jablonski Verlon Pischke, MS, RD, LDN Inpatient Clinical Dietitian Pager: [phone] Weekend/After Hours: [phone]  

## 2019-04-28 NOTE — Progress Notes (Signed)
Pt reported being nauseous, gave PRN with good results. Said felt that way both time colestipol given.  Wife verbalized that may need to go back on Reglan due to pt reporting feeling full.  Pt reported that would discuss with MD in am. Pt was able to tolerate most of medications but refused all large pills.

## 2019-04-28 NOTE — Progress Notes (Signed)
PHYSICAL MEDICINE & REHABILITATION PROGRESS NOTE   Subjective/Complaints: Feels that he's making progress with his bowels and buttock pain. Stood 30 seconds  ROS: Patient denies fever, rash, sore throat, blurred vision, nausea, vomiting, diarrhea, cough, shortness of breath or chest pain,  headache, or mood change.    Objective:   No results found. Recent Labs    04/27/19 1515 04/28/19 0840  WBC 15.2* 13.5*  HGB 8.0* 8.3*  HCT 26.5* 27.8*  PLT 564* 600*   Recent Labs    04/27/19 1515  NA 134*  K 3.4*  CL 95*  CO2 25  GLUCOSE 104*  BUN 32*  CREATININE 4.36*  CALCIUM 8.4*    Intake/Output Summary (Last 24 hours) at 04/28/2019 1147 Last data filed at 04/28/2019 0810 Gross per 24 hour  Intake 0 ml  Output 2285 ml  Net -2285 ml     Physical Exam: Vital Signs Blood pressure (!) 103/53, pulse 90, temperature 98.1 F (36.7 C), resp. rate 18, height 6\' 2"  (1.88 m), weight 129.6 kg, SpO2 98 %. Constitutional: No distress . Vital signs reviewed. HEENT: EOMI, oral membranes moist Neck: supple Cardiovascular: RRR without murmur. No JVD    Respiratory: CTA Bilaterally without wheezes or rales. Normal effort    GI: BS +, non-tender, non-distended  Skin: Warm and dry.  Heel blisters/eschar are stable. Excoriated/macerated region around buttock improved with only 1-2 small openings currently Psych: Flat affect. Musc: Lower extremity edema ongoing Neurological: Alert Motor: Bilateral upper extremities: 4/5 proximal distal Right lower extremity: 3/5 proximal distal Left lower extremity: 3/5 proximal to distal-stable motor  Assessment/Plan: 1. Functional deficits secondary to debility which require 3+ hours per day of interdisciplinary therapy in a comprehensive inpatient rehab setting.  Physiatrist is providing close team supervision and 24 hour management of active medical problems listed below.  Physiatrist and rehab team continue to assess barriers to  discharge/monitor patient progress toward functional and medical goals  Care Tool:  Bathing  Bathing activity did not occur: Refused Body parts bathed by patient: Front perineal area, Buttocks         Bathing assist Assist Level: 2 Helpers     Upper Body Dressing/Undressing Upper body dressing   What is the patient wearing?: Pull over shirt    Upper body assist Assist Level: Supervision/Verbal cueing    Lower Body Dressing/Undressing Lower body dressing      What is the patient wearing?: Pants, Incontinence brief     Lower body assist Assist for lower body dressing: Maximal Assistance - Patient 25 - 49%     Toileting Toileting    Toileting assist Assist for toileting: 2 Helpers     Transfers Chair/bed transfer  Transfers assist  Chair/bed transfer activity did not occur: Safety/medical concerns  Chair/bed transfer assist level: 2 Helpers     Locomotion Ambulation   Ambulation assist   Ambulation activity did not occur: Safety/medical concerns          Walk 10 feet activity   Assist  Walk 10 feet activity did not occur: Safety/medical concerns        Walk 50 feet activity   Assist Walk 50 feet with 2 turns activity did not occur: Safety/medical concerns         Walk 150 feet activity   Assist Walk 150 feet activity did not occur: Safety/medical concerns         Walk 10 feet on uneven surface  activity   Assist Walk 10 feet on  uneven surfaces activity did not occur: Safety/medical concerns         Wheelchair     Assist Will patient use wheelchair at discharge?: Yes Type of Wheelchair: Manual Wheelchair activity did not occur: Safety/medical concerns  Wheelchair assist level: Minimal Assistance - Patient > 75% Max wheelchair distance: 5'    Wheelchair 50 feet with 2 turns activity    Assist    Wheelchair 50 feet with 2 turns activity did not occur: Safety/medical concerns       Wheelchair 150 feet  activity     Assist  Wheelchair 150 feet activity did not occur: Safety/medical concerns       Blood pressure (!) 103/53, pulse 90, temperature 98.1 F (36.7 C), resp. rate 18, height 6\' 2"  (1.88 m), weight 129.6 kg, SpO2 98 %.  Medical Problem List and Plan: 1.  Impaired mobility and ADLs secondary to debility   Continue CIR   -now likely seeking place given pt's slow progress. I told him we can continue to work as usual with him as we are looking for a bed as I doubt we will reach a manageable level for his wife to manage while with [place]. 2.  St Jude's Aortic Valve/Antithrombotics:  -DVT/anticoagulation:  Pharmaceutical: Coumadin  INR subtherapeutic on 12/20, discussed with pharmacy, patient refused heparin gtt.  12/28:  INR 3.1             -antiplatelet therapy: N/A 3. Pain Management: Oxycodone prn  -resumed lyrica at hs only 25mg , added additional 25 mg postdialysis 4. Mood: LCSW to follow for evaluation and support.              -antipsychotic agents: N/A  -anxiety disorder/ recent MDD    increased xanax back to 0.5mg ,   -some improvement in general  Zoloft increased to home dose of 100 mg on 12/20 5. Neuropsych: This patient is capable of making decisions on his own behalf. 6. Skin/Wound Care: Air mattress overlay for MASD due to ongoing diarrhea.    -gerhardt's cream to buttocks  -rx loose stools  -prevalon boots bilateral heels  -overall improved 7. Fluids/Electrolytes/Nutrition: Strict I/O. Renal diet with 1200 cc FR.  8.   ESRD: Now on HD. Schedule hemodialysis at the end of the day to help with therapy tolerance. TTS schedule   12/10: diet liberalized to help with intake  -volume/edema mgt per nephrology   [facility] Weights   04/27/19 0506 04/27/19 1320 04/27/19 1700  Weight: 130.8 kg 131.6 kg 129.6 kg    Stable on 12/28 9. Hypotension: Monitor BP tid--continue midodrine.  Relatively controlled and asymptomatic on 12/20, 12/24, 12/25 10 History of A fib/A flutter:  Monitor HR -on coumadin. Continue Lopressor 12.5mg  BID. 11. Recent GIB/Anemia of chronic disease: Continue to monitor H/H with serial checks. On Retacrit 10,000 units with HD.     hgb/tranfusion per nephrology-hemoglobin 8.6 on 12/19 12. Loose stool: C diff negative  -continue fiber  -increased probiotic to 500mg   TID  -diet--reinforced that consistent PO intake is important also  -12/18 stopped megace given it's lack of benefit and potential for loose stool (although was having loose stool long before this was started)  Appreciate GI recs, medication initiated, await further recs  12/22: Lomotil prn is ordered but has not been administered; will place general nursing order to administer.   12/23: Lomotil administered twice yesterday. Patient reports 10 BM yesterday. Will change order to scheduled QID.  12/24: Diarrhea resolved. Will change Lomotil back to prn.  Continue metamucil which has helped stool be more formed. Appreciate pharmacy's input.   12/27: Increased colestipol frequency upon discussion with patient and wife.  12/28: stools in blobs this morning. Continue with current regimen 13. Cough with phlegm: Guaifenesin prn 14. Shortness of breath at rest: encourage IS, anxiety mgt. Improved 15. Leukocytosis:  -wbc's 13.4 on 12/19, 14.3 on 12/23  Afebrile  -bc negative no growth 16.  Morbid obesity: Encouraged weight loss   LOS: 21 days A FACE TO FACE EVALUATION WAS PERFORMED  Meredith Staggers 04/28/2019, 11:47 AM

## 2019-04-28 NOTE — Progress Notes (Signed)
Physical Therapy Session Note  Patient Details  Name: Calvin [name] MRN: [id number] Date of Birth: 1955/12/15  Today's Date: 04/28/2019 PT Individual Time: 1499-6924 PT Individual Time Calculation (min): 70 min   Short Term Goals: Week 3:  PT Short Term Goal 1 (Week 3): Pt will perform sit<>stand w/ max assist +1 PT Short Term Goal 2 (Week 3): Pt will tolerate sitting up and OOB x3 hrs/day PT Short Term Goal 3 (Week 3): Pt will self-propel manual w/c x 25' w/ supervision PT Short Term Goal 4 (Week 3): Pt will maintain upright sitting at EOB w/ supervision x10 min consistently  Skilled Therapeutic Interventions/Progress Updates:   Pt in supine and agreeable to therapy, no c/o pain. Pt agreeable to try tilt table to work on upright tolerance. R/L rolling w/ mod assist to don shorts and place maxi slide. +2 assist to slide onto tilt table from hospital bed. Tilted on tilt table as detailed below including vitals and pt response. HR all WNL, 90-110 bpm in standing. Pt also performed quad sets at all levels of tilt, including during rest breaks, to work on functional LE strengthening in WB position. Tactile cues for B quad contraction, RLE able to achieve full knee extension, LLE lacking 5-10 deg 2/2 hamstring tightness but strong contraction palpated. 3-5 sec hold for each quad set.   -BP @ 45 deg, 5 min - 109/44, dizziness rated @ 2/10  -BP @ 55 deg, 2 min - 96/52, dizziness increased and onset of nausea -supine rest in 20 deg for 3-4 min  -BP @ 50 deg, 2 min - 93/41, dizziness/nausea rated @ 3/10  -supine rest in 20 deg for 1-2 min, BP 125/54  Maxi slide transfer back to bed. R/L rolling to remove shorts and brief as pt had been incontinent. Total assist for pericare. Ended session in supine, all needs in reach. BLEs positioned to neutral w/ assist of pillows and bolster.   Therapy Documentation Precautions:  Precautions Precautions: Fall Precaution Comments: monitor HR, L heel  wound Restrictions Weight Bearing Restrictions: No Vital Signs:   Therapy/Group: Individual Therapy  Renezmae Canlas K Merlean Pizzini 04/28/2019, 11:17 AM

## 2019-04-29 ENCOUNTER — Inpatient Hospital Stay (HOSPITAL_COMMUNITY): Payer: Medicare Other | Admitting: Occupational Therapy

## 2019-04-29 ENCOUNTER — Inpatient Hospital Stay (HOSPITAL_COMMUNITY): Payer: Medicare Other

## 2019-04-29 ENCOUNTER — Inpatient Hospital Stay (HOSPITAL_COMMUNITY): Payer: Medicare Other | Admitting: Physical Therapy

## 2019-04-29 LAB — RENAL FUNCTION PANEL
Albumin: 2.1 g/dL — ABNORMAL LOW (ref 3.5–5.0)
Anion gap: 16 — ABNORMAL HIGH (ref 5–15)
BUN: 51 mg/dL — ABNORMAL HIGH (ref 8–23)
CO2: 23 mmol/L (ref 22–32)
Calcium: 9.3 mg/dL (ref 8.9–10.3)
Chloride: 100 mmol/L (ref 98–111)
Creatinine, Ser: 7.24 mg/dL — ABNORMAL HIGH (ref 0.61–1.24)
GFR calc Af Amer: 8 mL/min — ABNORMAL LOW (ref >60)
GFR calc non Af Amer: 7 mL/min — ABNORMAL LOW (ref >60)
Glucose, Bld: 113 mg/dL — ABNORMAL HIGH (ref 70–99)
Phosphorus: 3.2 mg/dL (ref 2.5–4.6)
Potassium: 3.8 mmol/L (ref 3.5–5.1)
Sodium: 139 mmol/L (ref 135–145)

## 2019-04-29 LAB — CBC
HCT: 25.3 % — ABNORMAL LOW (ref 39.0–52.0)
Hemoglobin: 7.7 g/dL — ABNORMAL LOW (ref 13.0–17.0)
MCH: 28 pg (ref 26.0–34.0)
MCHC: 30.4 g/dL (ref 30.0–36.0)
MCV: 92 fL (ref 80.0–100.0)
Platelets: 574 10*3/uL — ABNORMAL HIGH (ref 150–400)
RBC: 2.75 MIL/uL — ABNORMAL LOW (ref 4.22–5.81)
RDW: 17.7 % — ABNORMAL HIGH (ref 11.5–15.5)
WBC: 15.8 10*3/uL — ABNORMAL HIGH (ref 4.0–10.5)
nRBC: 0 % (ref 0.0–0.2)

## 2019-04-29 LAB — PROTIME-INR
INR: 3 — ABNORMAL HIGH (ref 0.8–1.2)
Prothrombin Time: 30.8 seconds — ABNORMAL HIGH (ref 11.4–15.2)

## 2019-04-29 MED ORDER — WARFARIN SODIUM 1 MG PO TABS
1.0000 mg | ORAL_TABLET | Freq: Once | ORAL | Status: AC
  Administered 2019-04-29: 1 mg via ORAL
  Filled 2019-04-29: qty 1

## 2019-04-29 MED ORDER — PSYLLIUM 95 % PO PACK: 1.0000 | PACK | Freq: Three times a day (TID) | ORAL | Status: DC

## 2019-04-29 MED ORDER — HEPARIN SODIUM (PORCINE) 1000 UNIT/ML IJ SOLN
INTRAMUSCULAR | Status: AC
  Filled 2019-04-29: qty 6

## 2019-04-29 MED ORDER — SIMETHICONE 80 MG PO CHEW
80.0000 mg | CHEWABLE_TABLET | Freq: Three times a day (TID) | ORAL | Status: DC
  Administered 2019-04-29 – 2019-05-09 (×27): 80 mg via ORAL
  Filled 2019-04-29 (×28): qty 1

## 2019-04-29 NOTE — Procedures (Signed)
   I was present at this dialysis session, have reviewed the session itself and made  appropriate changes [name] MD [place] pager [phone]   04/29/2019, 3:50 PM

## 2019-04-29 NOTE — Progress Notes (Signed)
Occupational Therapy Session Note  Patient Details  Name: Calvin [name] MRN: [id number] Date of Birth: 09/26/1955  Today's Date: 04/29/2019 OT Individual Time: 2081-3887 OT Individual Time Calculation (min): 50 min    Short Term Goals: Week 3:  OT Short Term Goal 1 (Week 3): Pt will remain OOB 1x per day for >30 min to improve tolerance to upright OT Short Term Goal 2 (Week 3): Pt will transfer to [facility] with MAX A of 1 OT Short Term Goal 3 (Week 3): Pt will roll with S to decrease BOC after BM  Skilled Therapeutic Interventions/Progress Updates:    Pt received supine in bed with no initial c/o pain. Pt completed rolling R and L with mod A for brief change 2/2 incontinent BM. Pt required mod cueing for technique to come EOB and max A overall. Pt sat EOB for several minutes to reduce dizziness. Pt reported it had alleviated and was agreeable to session. Pt completed beasy board transfer to the TIS w/c with heavy max A. Pt was able to scoot his bottom back in the chair with mod A. Pt completed oral care and hair care at the sink with mod A. Pt completed UB dressing with mod A. Pt was left sitting up in the chair with all needs met.   7/10 pain in his buttocks with sitting but alleviated with max tilt.   Therapy Documentation Precautions:  Precautions Precautions: Fall Precaution Comments: monitor HR, L heel wound Restrictions Weight Bearing Restrictions: No  Therapy/Group: Individual Therapy  Curtis Sites 04/29/2019, 7:00 AM

## 2019-04-29 NOTE — Progress Notes (Signed)
Gave report to Dialysis approx 12:30,  Medications administered, pt ate lunch, pt cleaned. Transport arrived to take pt and pt reported he needed personal care. Transport left. Called dialysis spoke to [place], she informed me that transport can't wait on personal care that has other pts. Pt now ready. Waiting on transport.

## 2019-04-29 NOTE — Plan of Care (Signed)
  Problem: Sit to Stand Goal: LTG:  Patient will perform sit to stand with assistance level (PT) Description: LTG:  Patient will perform sit to stand with assistance level (PT) Flowsheets (Taken 04/29/2019 1254) LTG: PT will perform sit to stand in preparation for functional mobility with assistance level: (goal downgraded 12/29 due to lack of progress - AAT) Maximal Assistance - Patient 25 - 49% Note: goal downgraded 12/29 due to lack of progress - AAT   Problem: RH Wheelchair Mobility Goal: LTG Patient will propel w/c in home environment (PT) Description: LTG: Patient will propel wheelchair in home environment, # of feet with assistance (PT). Outcome: Not Applicable Flowsheets (Taken 04/29/2019 1254) LTG: Pt will propel w/c in home environ  assist needed:: (goal discontinued 12/29 due to lack of progress - AAT) -- Note: goal discontinued 12/29 due to lack of progress - AAT

## 2019-04-29 NOTE — Progress Notes (Signed)
Occupational Therapy Session Note  Patient Details  Name: Calvin [name] MRN: [id number] Date of Birth: 1956-03-12  Today's Date: 04/29/2019 OT Individual Time: 1000-1030 OT Individual Time Calculation (min): 30 min    Short Term Goals: Week 3:  OT Short Term Goal 1 (Week 3): Pt will remain OOB 1x per day for >30 min to improve tolerance to upright OT Short Term Goal 2 (Week 3): Pt will transfer to [facility] with MAX A of 1 OT Short Term Goal 3 (Week 3): Pt will roll with S to decrease BOC after BM  Skilled Therapeutic Interventions/Progress Updates:    Pt greeted sitting tilted in TIS and agreeable to OT treatment session. Pt reported his bottom was hurting. OT tilted wc forward and assisted pt with placing LEs on the floor. Worked on pressure relief positioning in wc with wc push ups with pt able to achieve some pressure relief and hold for 30 seconds, but unable to lift bottom all the way off of wc. Worked on lateral leans in wc to also relieve pressure. Pt took rest break tilted in wc, then requested to return to bed. OT went to find +2 to assist for transfer. When OT returned, pt asked what the benefits were to staying up in wc. OT explained benefits and pt agreeable to stay up until PT at 11:15. UB there-ex with focus on L shoulder/scapula. Pt left tilted in wc with call bell in reach and needs met.   Therapy Documentation Precautions:  Precautions Precautions: Fall Precaution Comments: monitor HR, L heel wound Restrictions Weight Bearing Restrictions: No Pain:   Pt reports soreness in buttocks, but no number given. Repositioned for pressure relief and comfort.  Therapy/Group: Individual Therapy  Valma Cava 04/29/2019, 10:32 AM

## 2019-04-29 NOTE — Progress Notes (Signed)
Physical Therapy Weekly Progress Note  Patient Details  Name: Calvin [name] MRN: [id number] Date of Birth: 08/28/1955  Beginning of progress report period: April 22, 2019 End of progress report period: April 29, 2019  Today's Date: 04/29/2019 PT Individual Time: 1115-1140 PT Individual Time Calculation (min): 25 min   Patient has met 2 of 4 short term goals. Pt continues to make limited progress towards LTGs 2/2 decreased OOB tolerance, frequent and incontinent BMs, and anxiety w/ all mobility. He is tolerating sitting up and OOB 2-3 hrs at a time, performing bed mobility w/ mod assist consistently, and initiating standing w/ Clarise Cruz and/or tilt table. He continues to require max assist +2 for [name] transfers.   Patient continues to demonstrate the following deficits muscle weakness and muscle joint tightness, decreased cardiorespiratoy endurance and decreased sitting balance and decreased balance strategies and therefore will continue to benefit from skilled PT intervention to increase functional independence with mobility.  Patient not progressing toward long term goals.  See goal revision..  Plan of care revisions: sit<>stand downgraded to max assist, home w/c mobility goal discontinued.  PT Short Term Goals Week 3:  PT Short Term Goal 1 (Week 3): Pt will perform sit<>stand w/ max assist +1 PT Short Term Goal 1 - Progress (Week 3): Not progressing PT Short Term Goal 2 (Week 3): Pt will tolerate sitting up and OOB x3 hrs/day PT Short Term Goal 2 - Progress (Week 3): Met PT Short Term Goal 3 (Week 3): Pt will self-propel manual w/c x 25' w/ supervision PT Short Term Goal 3 - Progress (Week 3): Not progressing PT Short Term Goal 4 (Week 3): Pt will maintain upright sitting at EOB w/ supervision x10 min consistently PT Short Term Goal 4 - Progress (Week 3): Met Week 4:  PT Short Term Goal 1 (Week 4): Pt will participate in 60 min of upright activity w/ minimal increase in  fatigue PT Short Term Goal 2 (Week 4): Pt will maintain upright sitting at EOB w/ supervision x15 min consistently  Skilled Therapeutic Interventions/Progress Updates:   Pt in TIS and requesting to return to supine. States his bottom and back are too sore to remain sitting upright. Discussed benefits of staying up a bit longer to work on BLE strengthening in seated position, potentially using kinetron, pt continued to decline. [name] transfer to EOB w/ max assist +2. Verbal, tactile, and manual cues for BUE placement, technique, anterior weight shifting, and head/hips relationship. Sit>supine w/ mod assist. Pt reporting he had BM in brief. R/L rolling w/ mod assist w/ max verbal and tactile cues for technique. Pt incontinent of bowel. Total assist for pericare. Discussed w/ pt of making goal of having continent BM, pt in agreement. Pt also agreeable to position in sidelying for pressure relief. Ended session in L sidelying, propped w/ bolster and pillows, resting comfortable and call bell within reach. Missed 20 min of skilled PT 2/2 fatigue/refusal.   Therapy Documentation Precautions:  Precautions Precautions: Fall Precaution Comments: monitor HR, L heel wound Restrictions Weight Bearing Restrictions: No General: PT Amount of Missed Time (min): 20 Minutes PT Missed Treatment Reason: Patient fatigue  Therapy/Group: Individual Therapy  Redonna Wilbert Clent Demark 04/29/2019, 12:19 PM

## 2019-04-29 NOTE — Patient Care Conference (Signed)
Inpatient RehabilitationTeam Conference and Plan of Care Update Date: 04/29/2019   Time: 10:05 AM   Patient Name: Calvin [name]      Medical Record Number: [id number]  Date of Birth: 1955/12/12 Sex: Male         Room/Bed: 4W11C/4W11C-01 Payor Info: Payor: MEDICARE / Plan: MEDICARE PART A AND B / Product Type: *No Product type* /    Admit Date/Time:  04/04/2019  2:47 PM  Primary Diagnosis:  Physical debility  Patient Active Problem List   Diagnosis Date Noted  . Hemodialysis-associated hypotension   . Subtherapeutic international normalized ratio (INR)   . Morbid obesity ([place])   . ESRD on dialysis ([place])   . Anemia of chronic disease   . Rectal pain   . Leukocytosis   . Major depressive disorder   . Pressure injury of skin 04/17/2019  . Generalized anxiety disorder   . SOB (shortness of breath)   . Physical debility 04/05/2019  . Complex coarctation of the aorta   . Hepatitis C   . AVD (aortic valve disease)   . Long term (current) use of anticoagulants 12/12/2013  . Chronic anticoagulation 07/10/2012  . Gastric ulcer 07/04/2012  . Gout 07/03/2012  . Bilateral lower extremity edema 07/03/2012  . CKD (chronic kidney disease) stage 3, GFR 30-59 ml/min 07/02/2012  . Knee pain 12/03/2011  . Anemia of renal disease 10/04/2011  . Hyperlipidemia 04/23/2011  . 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 S/P aortic valve replacement 04/05/2011  . Thoracic aortic aneurysm ([place]) 04/05/2011  . Atrial fibrillation ([place]) 04/05/2011  . History of atrial flutter s/p DCCV 04/05/2011  . Coarctation of aorta (previous repair and stent) 04/05/2011  . Hypertension 04/05/2011  . CAD (coronary artery disease) 04/05/2011  . [place] ([place]) 04/03/2011  . Anemia, iron deficiency 04/03/2011    Expected Discharge Date: Expected Discharge Date: (Plans for DC to SNF)  Team Members Present: Physician leading conference: Dr. Alger Simons Social Worker Present: Lennart Pall, LCSW Nurse Present: Isla Pence,  RN Case Manager: Karene Fry, RN PT Present: Burnard Bunting, PT OT Present: Laverle Hobby, OT PPS Coordinator present : Gunnar Fusi, Novella Olive, PT     Current Status/Progress Goal Weekly Team Focus  Bowel/Bladder   hemodialysis pt without urination/ stool are now soft/formed but pt having frequent small stools, refusing his bulking agents at times for nausea and bloating  Regain continence of bowel  q 2 hour toileting q shift and prn   Swallow/Nutrition/ Hydration             ADL's   Still limited by incontinent BM's, poor OOB tolerance, max A+2 slideboard transfers, improvement in bed mobility, rolling with min A  max A overall, most goals mobility focused  ADL transfers, OOB tolerance, skin integrity, d/c planning   Mobility   mod A bed mobility, max A +1 to +2 beasy board transfer, sit<>stands total A in [place] lift  mod assist overall, w/c level  OOB tolerance, standing, BLE strengthening/ROM, bed mobility   Communication             Safety/Cognition/ Behavioral Observations            Pain   stomach/bloating pain and nausea, pain with peri care  remain free of pain  assess q shift and prn   Skin   open bleeding MASD to inner buttocks, coating with various creams, bil waffle boots   RESOLVE AnD PREVENT ADDITIONAL SKIN REAKDOWN , SKIN FREE OF INFECTION  assess q shift  and prn, no diapers while in bed to prevent chaffing    Rehab Goals Patient on target to meet rehab goals: No *See Care Plan and progress notes for long and short-term goals.     Barriers to Discharge  Current Status/Progress Possible Resolutions Date Resolved   Nursing                  PT                    OT Hemodialysis;Incontinence;Home Copywriter, advertising                SLP                SW                Discharge Planning/Teaching Needs:  Plan has changed to SNF  NA   Team Discussion: Bowels are better, wounds on bottom are better.  RN - on HD, anxiety, frequent stools.  OT BMs  frequently, have to change each therapy session, max A with BZ board, using Clarise Cruz for stands, goals max A.  PT max A +1-2 BZ board, mod A bed, working on sara and tilt table.  Plans are for DC to SNF at this time.   Revisions to Treatment Plan: N/A      Medical Summary Current Status: bowels are slowing, pt concerned about them being too scarce now!Marland Kitchen anxious, very weak still Weekly Focus/Goal: improve functional mobility, normalize bowels as we've been working on  Barriers to Discharge: Medical stability       Continued Need for Acute Rehabilitation Level of Care: The patient requires daily medical management by a physician with specialized training in physical medicine and rehabilitation for the following reasons: Direction of a multidisciplinary physical rehabilitation program to maximize functional independence : Yes Medical management of patient stability for increased activity during participation in an intensive rehabilitation regime.: Yes Analysis of laboratory values and/or radiology reports with any subsequent need for medication adjustment and/or medical intervention. : Yes   I attest that I was present, lead the team conference, and concur with the assessment and plan of the team.   Jodell Cipro M 04/30/2019, 11:58 AM  Team conference was held via web/ teleconference due to [place] - 19

## 2019-04-29 NOTE — Progress Notes (Signed)
Pt requesting to see GI doctor today. Questioning the bloating feeling and associated nausea after medication. Yesterday wife had questioned if pt needed to resume Reglan. This am Fibercon, Metamucil, and colestipol held. Dr Naaman Plummer aware, reports GI had signed off but will have them come by. Will inform wife and pt and inform will be seen when they can get by.

## 2019-04-29 NOTE — Progress Notes (Signed)
[facility] PHYSICAL MEDICINE & REHABILITATION PROGRESS NOTE   Subjective/Complaints: Feels more bloated. Questions whether he should still be on metamucil and colestipol. Sob last night  ROS: Patient denies fever, rash, sore throat, blurred vision, nausea, vomiting,   cough,   chest pain,  .    Objective:   No results found. Recent Labs    04/27/19 1515 04/28/19 0840  WBC 15.2* 13.5*  HGB 8.0* 8.3*  HCT 26.5* 27.8*  PLT 564* 600*   Recent Labs    04/27/19 1515  NA 134*  K 3.4*  CL 95*  CO2 25  GLUCOSE 104*  BUN 32*  CREATININE 4.36*  CALCIUM 8.4*    Intake/Output Summary (Last 24 hours) at 04/29/2019 1022 Last data filed at 04/29/2019 0700 Gross per 24 hour  Intake 240 ml  Output --  Net 240 ml     Physical Exam: Vital Signs Blood pressure (!) 106/46, pulse 93, temperature (!) 97 F (36.1 C), temperature source Oral, resp. rate 18, height 6\' 2"  (1.88 m), weight 130.7 kg, SpO2 92 %. Constitutional: No distress . Vital signs reviewed. HEENT: EOMI, oral membranes moist Neck: supple Cardiovascular: RRR without murmur. No JVD    Respiratory: CTA Bilaterally without wheezes or rales. Normal effort    GI: BS +, non-tender, non-distended  Skin: Warm and dry.  Heel blisters/eschar are stable. Excoriated/macerated region around buttock improved with only 1-2 small openings --much improved Psych: Flat affect. Musc: Lower extremity edema ongoing, stable to improved Neurological: Alert Motor: Bilateral upper extremities: 4/5 proximal distal Right lower extremity: 3/5 proximal distal Left lower extremity: 3/5 proximal to distal-stable motor  Assessment/Plan: 1. Functional deficits secondary to debility which require 3+ hours per day of interdisciplinary therapy in a comprehensive inpatient rehab setting.  Physiatrist is providing close team supervision and 24 hour management of active medical problems listed below.  Physiatrist and rehab team continue to assess  barriers to discharge/monitor patient progress toward functional and medical goals  Care Tool:  Bathing  Bathing activity did not occur: Refused Body parts bathed by patient: Front perineal area, Buttocks         Bathing assist Assist Level: 2 Helpers     Upper Body Dressing/Undressing Upper body dressing   What is the patient wearing?: Pull over shirt    Upper body assist Assist Level: Supervision/Verbal cueing    Lower Body Dressing/Undressing Lower body dressing      What is the patient wearing?: Pants, Incontinence brief     Lower body assist Assist for lower body dressing: Maximal Assistance - Patient 25 - 49%     Toileting Toileting    Toileting assist Assist for toileting: 2 Helpers     Transfers Chair/bed transfer  Transfers assist  Chair/bed transfer activity did not occur: Safety/medical concerns  Chair/bed transfer assist level: 2 Helpers     Locomotion Ambulation   Ambulation assist   Ambulation activity did not occur: Safety/medical concerns          Walk 10 feet activity   Assist  Walk 10 feet activity did not occur: Safety/medical concerns        Walk 50 feet activity   Assist Walk 50 feet with 2 turns activity did not occur: Safety/medical concerns         Walk 150 feet activity   Assist Walk 150 feet activity did not occur: Safety/medical concerns         Walk 10 feet on uneven surface  activity  Assist Walk 10 feet on uneven surfaces activity did not occur: Safety/medical concerns         Wheelchair     Assist Will patient use wheelchair at discharge?: Yes Type of Wheelchair: Manual Wheelchair activity did not occur: Safety/medical concerns  Wheelchair assist level: Minimal Assistance - Patient > 75% Max wheelchair distance: 5'    Wheelchair 50 feet with 2 turns activity    Assist    Wheelchair 50 feet with 2 turns activity did not occur: Safety/medical concerns       Wheelchair  150 feet activity     Assist  Wheelchair 150 feet activity did not occur: Safety/medical concerns       Blood pressure (!) 106/46, pulse 93, temperature (!) 97 F (36.1 C), temperature source Oral, resp. rate 18, height 6\' 2"  (1.88 m), weight 130.7 kg, SpO2 92 %.  Medical Problem List and Plan: 1.  Impaired mobility and ADLs secondary to debility   Continue CIR   -slow progress, continuing to work on transfers, basic mobility  -will need placement 2.  St Jude's Aortic Valve/Antithrombotics:  -DVT/anticoagulation:  Pharmaceutical: Coumadin  INR subtherapeutic on 12/20, discussed with pharmacy, patient refused heparin gtt.  12/29:  INR 3.0             -antiplatelet therapy: N/A 3. Pain Management: Oxycodone prn  -resumed lyrica at hs only 25mg , added additional 25 mg postdialysis 4. Mood: LCSW to follow for evaluation and support.              -antipsychotic agents: N/A  -anxiety disorder/ recent MDD    increased xanax back to 0.5mg ,   -some improvement in general  Zoloft increased to home dose of 100 mg on 12/20 5. Neuropsych: This patient is capable of making decisions on his own behalf. 6. Skin/Wound Care: Air mattress overlay for MASD due to ongoing diarrhea.    -gerhardt's cream to buttocks  -rx loose stools  -prevalon boots bilateral heels  -overall improved 7. Fluids/Electrolytes/Nutrition: Strict I/O. Renal diet with 1200 cc FR.  8.   ESRD: Now on HD. Schedule hemodialysis at the end of the day to help with therapy tolerance. TTS schedule   12/10: diet liberalized to help with intake  -volume/edema mgt per nephrology   [facility] Weights   04/27/19 1320 04/27/19 1700 04/29/19 0336  Weight: 131.6 kg 129.6 kg 130.7 kg    Stable on 12/29 9. Hypotension: Monitor BP tid--continue midodrine.  Relatively controlled and asymptomatic on 12/20, 12/24, 12/25 10 History of A fib/A flutter: Monitor HR -on coumadin. Continue Lopressor 12.5mg  BID. 11. Recent GIB/Anemia of chronic  disease: Continue to monitor H/H with serial checks. On Retacrit 10,000 units with HD.     hgb/tranfusion per nephrology-hemoglobin 8.6 on 12/19 12. Loose stool: C diff negative  -continue fiber  -increased probiotic to 500mg   TID  -diet--reinforced that consistent PO intake is important also  -12/18 stopped megace given it's lack of benefit and potential for loose stool (although was having loose stool long before this was started)  Appreciate GI recs, medication initiated, await further recs  12/22: Lomotil prn is ordered but has not been administered; will place general nursing order to administer.   12/23: Lomotil administered twice yesterday. Patient reports 10 BM yesterday. Will change order to scheduled QID.  12/24: Diarrhea resolved. Will change Lomotil back to prn. Continue metamucil which has helped stool be more formed. Appreciate pharmacy's input.   12/27: Increased colestipol frequency upon discussion with patient  and wife.  12/29: stool more formed, still soft and runny at times   -bloating likely from metamucil TID---hold for now   -schedule simethicone 13. Cough with phlegm: Guaifenesin prn 14. Shortness of breath at rest: encourage IS, anxiety mgt. Improved 15. Leukocytosis:  -wbc's 13.4 on 12/19, 14.3 on 12/23  Afebrile  -bc negative no growth 16.  Morbid obesity: Encouraged weight loss    LOS: 22 days A FACE TO FACE EVALUATION WAS PERFORMED  Meredith Staggers 04/29/2019, 10:22 AM

## 2019-04-29 NOTE — Progress Notes (Signed)
ANTICOAGULATION CONSULT NOTE - Follow Up Consult  Pharmacy Consult for Warfarin Indication: atrial fibrillation and St. Jude AVR (0865H)  Patient Measurements: Height: 6\' 2"  (188 cm) Weight: 288 lb 2.3 oz (130.7 kg) IBW/kg (Calculated) : 82.2  Vital Signs: Temp: 97 F (36.1 C) (12/29 0336) Temp Source: Oral (12/29 0336) BP: 106/46 (12/29 0336) Pulse Rate: 93 (12/29 0336)  Labs: Recent Labs    04/27/19 0645 04/27/19 1515 04/28/19 0840 04/29/19 0546  HGB  --  8.0* 8.3*  --   HCT  --  26.5* 27.8*  --   PLT  --  564* 600*  --   LABPROT 34.7*  --  32.2* 30.8*  INR 3.4*  --  3.1* 3.0*  CREATININE  --  4.36*  --   --     Estimated Creatinine Clearance: 25.2 mL/min (A) (by C-G formula based on SCr of 4.36 mg/dL (H)).  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 Goal INR range should be 2.5-3.5 given indication; however, per discussion with patient, he was told to aim for 2.5 in mid 2019 by his MD due to history of GIB. Per chart review, appears that GIB events are related to procedures (polypectomy) and GI ulcers.   12/17 Received vitamin K PO 1mg  due to INR 5.4.  Note, patient does not eat any vegetables. Did not have bleeding other than small amounts from rectal wounds.  Based on this admission's warfarin doses, INRs, and narrow goal range, suspect patient needs closer to ~1mg  daily for maintenance despite reported PTA warfarin dose: 5 mg daily.  INR today 3.0 at goal.  Goal of Therapy:   INR 2.5-3   Plan:  - Warfarin 1mg  tonight - Monitor daily INR, Q72hr CBC, and S/S of bleeding   Benetta Spar, PharmD, BCPS, [facility] Clinical Pharmacist  Please check AMION for all [place] phone numbers After 10:00 PM, call [place]

## 2019-04-30 ENCOUNTER — Inpatient Hospital Stay (HOSPITAL_COMMUNITY): Payer: Medicare Other | Admitting: Physical Therapy

## 2019-04-30 ENCOUNTER — Inpatient Hospital Stay (HOSPITAL_COMMUNITY): Payer: Medicare Other

## 2019-04-30 ENCOUNTER — Encounter (HOSPITAL_COMMUNITY): Payer: Medicare Other | Admitting: Psychology

## 2019-04-30 LAB — PROTIME-INR
INR: 2.5 — ABNORMAL HIGH (ref 0.8–1.2)
Prothrombin Time: 26.9 seconds — ABNORMAL HIGH (ref 11.4–15.2)

## 2019-04-30 MED ORDER — WARFARIN SODIUM 2 MG PO TABS
2.0000 mg | ORAL_TABLET | Freq: Once | ORAL | Status: AC
  Administered 2019-04-30: 2 mg via ORAL
  Filled 2019-04-30: qty 1

## 2019-04-30 NOTE — Progress Notes (Signed)
Physical Therapy Session Note  Patient Details  Name: Calvin [name] MRN: [id number] Date of Birth: 24-Feb-1956  Today's Date: 04/30/2019 PT Individual Time: 0800-0840 AND [phone] PT Individual Time Calculation (min): 40 min AND 54 min  Short Term Goals: Week 4:  PT Short Term Goal 1 (Week 4): Pt will participate in 60 min of upright activity w/ minimal increase in fatigue PT Short Term Goal 2 (Week 4): Pt will maintain upright sitting at EOB w/ supervision x15 min consistently  Skilled Therapeutic Interventions/Progress Updates:   Session 1:  Pt in supine and agreeable to therapy, no c/o pain. Pt agreeable to get up to TIS. R/L rolling w/ mod-max assist to don shorts, verbal/tactile cues for rolling technique and LE placement. Supine>sit w/ max assist going towards L side (which is new for him). He needed max encouragement to attempt his other side to work on strengthening. Static sitting balance for 2-3 min w/ supervision while therapist set-up transfer. [name] transfer to TIS w/ max assist +2. Total assist w/c transport to/from therapy gym. Demonstrated use of kinetron to pt who was willing to try it. Kinetron performed @ 60 cm/sec, 1 min x3 reps. Needed to be tilted back for rest break in between bouts 2/2 discomfort at site of wounds on bottom. Returned to room total assist and ended session tilted in TIS, all needs in reach. Needed some encouragement to aim for staying up in TIS until next therapy session. Pt very pleased he used kinetron, updated his written progress on wall.   Session 2:  Pt in supine and agreeable to therapy, no c/o pain. R/L rolling w/ mod assist to don shorts and place maxislide. Maxislide transfer to tilt table from bed. Worked on standing tolerance and WB through BLEs on tilt table this session, see details and vitals below. Maxislide transfer back to bed/supine. R/L rolling w/ mod assist while therapist performed brief management and pericare, pt incontinent of  bowel. Ended session in supine, all needs in reach.   -BP @ 30 deg tilt - 135/66, minimal dizziness -BP @ 60 deg tilt, 1 min - 105/39, dizziness rated 4/10 -BP @ 60 deg tilt, 4 min - 103/45, dizziness rated 3/10 -rest break @ 30 deg tilt, mild SOB, spO2 98% and HR 82 bpm -BP @ 30 deg after 3-4 min - 115/56 -tossed beach ball for 2-3 min @ 30 deg tilt to work on endurance  Therapy Documentation Precautions:  Precautions Precautions: Fall Precaution Comments: monitor HR, L heel wound Restrictions Weight Bearing Restrictions: No Vital Signs: Therapy Vitals Temp: 98 F (36.7 C) Temp Source: Oral Pulse Rate: 91 Resp: 20 BP: 121/66 Patient Position (if appropriate): Lying Oxygen Therapy SpO2: 100 % O2 Device: Room Air  Therapy/Group: Individual Therapy  Munir [place] Clent Demark 04/30/2019, 8:41 AM

## 2019-04-30 NOTE — Progress Notes (Signed)
ANTICOAGULATION CONSULT NOTE - Follow Up Consult  Pharmacy Consult for Warfarin Indication: atrial fibrillation and St. Jude AVR (4132G)  Patient Measurements: Height: 6\' 2"  (188 cm) Weight: 287 lb 12.8 oz (130.5 kg) IBW/kg (Calculated) : 82.2  Vital Signs: Temp: 98 F (36.7 C) (12/30 0459) Temp Source: Oral (12/30 0459) BP: 121/66 (12/30 0459) Pulse Rate: 91 (12/30 0459)  Labs: Recent Labs    04/27/19 1515 04/28/19 0840 04/29/19 0546 04/29/19 1431 04/29/19 1439 04/30/19 0514  HGB 8.0* 8.3*  --   --  7.7*  --   HCT 26.5* 27.8*  --   --  25.3*  --   PLT 564* 600*  --   --  574*  --   LABPROT  --  32.2* 30.8*  --   --  26.9*  INR  --  3.1* 3.0*  --   --  2.5*  CREATININE 4.36*  --   --  7.24*  --   --     Estimated Creatinine Clearance: 15.2 mL/min (A) (by C-G formula based on SCr of 7.24 mg/dL (H)).  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 Goal INR range should be 2.5-3.5 given indication; however, per discussion with patient, he was told to aim for 2.5 in mid 2019 by his MD due to history of GIB. Per chart review, appears that GIB events are related to procedures (polypectomy) and GI ulcers.   12/17 Received vitamin K PO 1mg  due to INR 5.4.  Note, patient does not eat any vegetables. Did not have bleeding other than small amounts from rectal wounds.  Based on this admission's warfarin doses, INRs, and narrow goal range, suspect patient needs closer to ~1mg  daily for maintenance despite reported PTA warfarin dose: 5 mg daily.  INR today down to 2.5, will increase dose.   Goal of Therapy:   INR 2.5-3   Plan:  - Warfarin 2mg  tonight - Monitor daily INR, Q72hr CBC, and S/S of bleeding   Benetta Spar, PharmD, BCPS, [facility] Clinical Pharmacist  Please check AMION for all [place] phone numbers After 10:00 PM, call [place] [phone]

## 2019-04-30 NOTE — Progress Notes (Signed)
Occupational Therapy Weekly Progress Note  Patient Details  Name: Calvin [name] MRN: [id number] Date of Birth: 20-Jun-1955  Beginning of progress report period: April 24, 2019 End of progress report period: April 30, 2019  Today's Date: 04/30/2019 OT Individual Time: 1000-1030  OT Individual Time Calculation (min): 30 min  15 min missed 2/2 pt refusal/fatigue.    Patient has met 1 of 3 short term goals.  Pt continues to make slow progress toward his goals. He is limited by fatigue, frequent incontinent BM's, and reduced motivation. D/c plan has been changed to SNF to reflect need for ongoing, less intensive rehab efforts. Pt still requires max A +2 for LB ADLs and transfers.   Patient continues to demonstrate the following deficits: muscle weakness, decreased cardiorespiratoy endurance, decreased initiation and decreased sitting balance, decreased standing balance and decreased postural control and therefore will continue to benefit from skilled OT intervention to enhance overall performance with Reduce care partner burden.  Patient not progressing toward long term goals.  See goal revision..  Plan of care revisions: Goals downgraded to max A overall.  OT Short Term Goals Week 3:  OT Short Term Goal 1 (Week 3): Pt will remain OOB 1x per day for >30 min to improve tolerance to upright OT Short Term Goal 1 - Progress (Week 3): Met OT Short Term Goal 2 (Week 3): Pt will transfer to [facility] with MAX A of 1 OT Short Term Goal 2 - Progress (Week 3): Not met OT Short Term Goal 3 (Week 3): Pt will roll with S to decrease BOC after BM OT Short Term Goal 3 - Progress (Week 3): Not met Week 4:  OT Short Term Goal 1 (Week 4): STG=LTG d/t ELOS  Skilled Therapeutic Interventions/Progress Updates:    Pt received supine in bed, nursing transferred him 15 min prior 2/2 back pain from sitting in chair. Pt required brief change at bed level, total A with incontinent loose BM present. Cream applied per pt  instruction. Pt required heavy motivation this session to attempt most tasks. I.e. with bed mobility, pt initiating reach and then stating "I can't" without attempt to reach. Heavy encouragement and motivation provided by OT and tech. Pt transitioned to EOB with maximal assistance from OT. While EOB pt doffed shirt. Within 1 min EOB pt stating he had "tinkered out". Pt stating he could not remove top of deodorant and insisting OT do this for him. Pt encouraged and he was able to do so. Emphasized importance of participating in every day tasks that may seem minute but build strength. Pt began donning shirt in the style he usually does and was unable to thread R arm into sleeve. Encouraged pt to try OT method and with this method he had improved success. Again, pt with limited motivation/attempts to help. Pt returned to supine. Pillows positioned as requested and pt left supine with all needs met.   Therapy Documentation Precautions:  Precautions Precautions: Fall Precaution Comments: monitor HR, L heel wound Restrictions Weight Bearing Restrictions: No     Therapy/Group: Individual Therapy  Curtis Sites 04/30/2019, 6:51 AM

## 2019-04-30 NOTE — Progress Notes (Signed)
bp low after returning from dialysis, meds held until after midodrine was effective and bp wnl. Pt continues to refuse meds including renvela. Wife at  Bedside and is aware

## 2019-04-30 NOTE — Progress Notes (Signed)
Social Work Patient ID: Calvin [name], male   DOB: Sep 22, 1955, 63 y.o.   MRN: [id number]  Have spoken at length with wife this afternoon to review current conference report.  With the d/c plan now to be SNF, she understands that I am looking at many variables in placement.  Discussed need to get pt to a functional level to tolerate sitting up in w/c for the transport process and to be able to transfer to recliner at HD center once there.  Pt currently assigned to [facility] and will contact them directly to discuss what support options for transfers/ assistance they can offer there.  Wife states her priority would be "getting the better SNF" location and then, if needed to change HD location, see if that can be done.  Will begin SNF bed search to start ruling in/out our overall options.  Will keep pt/fam and team posted on questions/ concerns that may arise.    Keisha Amer, LCSW

## 2019-04-30 NOTE — Progress Notes (Signed)
Patient ID: Calvin [name], male   DOB: 03-Mar-1956, 63 y.o.   MRN: [id number]  [facility] Progress Note   Assessment/ Plan:   1.  S/P mechanical aortic valve replacement w/ stent repair of coarctation of aorta: With significant deconditioning and reports to be making slow but gradual progress with inpatient rehabilitation with ongoing management of musculoskeletal pain. 2. ESRD: cont TTS HD schedule. Had HD yest on holiday schedule, next HD tomorrow.  He would like to transition back to peritoneal dialysis at some point as an outpatient, he will continue these discussions with his outpatient nephrologist (Dr. Olivia Mackie). 3. Anemia: Without overt loss, continue high-dose ESA with next dose today (missed 12/24). 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 5. Nutrition: Continue current renal diet with ongoing ONS/protein supplementation. 6. Hypotension/ volume: on midodrine 10 tid and metop 12.5 bid.  10kg down from admit wt and euvolemic on exam, LE edema resolved. Suspect vol depleted, holding UF for HD for now , let vol come up a bit.   [name], MD 04/30/2019, 10:46 AM    Subjective:   No nausea on HD yest, still mild orthostatic symptoms.    Objective:   BP 121/66 (BP Location: Right Arm)   Pulse 91   Temp 98 F (36.7 C) (Oral)   Resp 20   Ht 6\' 2"  (1.88 m)   Wt 130.5 kg   SpO2 100%   BMI 36.95 kg/m   Physical Exam: Gen: Appears to be comfortable resting in bed CVS: Pulse regular rhythm, normal rate, mechanical S2 click audible Resp: Clear to auscultation, no rales/rhonchi.  Right IJ TDC. Abd: Soft, moderate global distention, nontender Ext: With trace ankle edema bilaterally  Labs: BMET Recent Labs  Lab 04/27/19 1515 04/29/19 1431  NA 134* 139  K 3.4* 3.8  CL 95* 100  CO2 25 23  GLUCOSE 104* 113*  BUN 32* 51*  CREATININE 4.36* 7.24*  CALCIUM 8.4* 9.3  PHOS 1.8* 3.2    CBC Recent Labs  Lab 04/27/19 1515 04/28/19 0840 04/29/19 1439  WBC 15.2* 13.5* 15.8*  HGB 8.0* 8.3* 7.7*  HCT 26.5* 27.8* 25.3*  MCV 90.4 90.6 92.0  PLT 564* 600* 574*      Medications:    . ALPRAZolam  0.5 mg Oral TID  . Chlorhexidine Gluconate Cloth  6 each Topical Q0600  . Chlorhexidine Gluconate Cloth  6 each Topical Q0600  . colestipol  4 g Oral TID  . darbepoetin (ARANESP) injection - DIALYSIS  200 mcg Intravenous Q Sat-HD  . Gerhardt's butt cream   Topical QID  . metoprolol tartrate  12.5 mg Oral BID  . midodrine  10 mg Oral TID WC  . multivitamin  1 tablet Oral QHS  . nystatin cream   Topical BID  . pantoprazole  40 mg Oral BID  . polycarbophil  625 mg Oral TID WC  . pregabalin  25 mg Oral QHS  . pregabalin  25 mg Oral Q T,Th,Sa-HD  . saccharomyces boulardii  500 mg Oral TID  . sertraline  100 mg Oral Daily  . sevelamer carbonate  1,600 mg Oral TID WC  . simethicone  80 mg Oral TID  . Warfarin - Pharmacist Dosing Inpatient   Does not apply [phone]

## 2019-04-30 NOTE — NC FL2 (Signed)
[place] LEVEL OF CARE SCREENING TOOL     IDENTIFICATION  Patient Name: Calvin [name] Birthdate: 1956/04/29 Sex: male Admission Date (Current Location): 04/24/2019  [facility] and [place] Number:  Herbalist and Address:  [facility] [place], [place] [address] [place], [place] [address]      Provider Number: [id number]  Attending Physician Name and Address:  Meredith Staggers, MD  Relative Name and Phone Number:       Current Level of Care: Other (Comment)(Acute Rehabilitation unit) Recommended Level of Care: [place] Prior Approval Number:    Date Approved/Denied:   PASRR Number: [id number]  Discharge Plan: SNF    Current Diagnoses: Patient Active Problem List   Diagnosis Date Noted  . Hemodialysis-associated hypotension   . Subtherapeutic international normalized ratio (INR)   . Morbid obesity ([place])   . ESRD on dialysis ([place])   . Anemia of chronic disease   . Rectal pain   . Leukocytosis   . Major depressive disorder   . Pressure injury of skin 04/17/2019  . Generalized anxiety disorder   . SOB (shortness of breath)   . Physical debility 04/01/2019  . Complex coarctation of the aorta   . Hepatitis C   . AVD (aortic valve disease)   . Long term (current) use of anticoagulants 12/12/2013  . Chronic anticoagulation 07/10/2012  . Gastric ulcer 07/04/2012  . Gout 07/03/2012  . Bilateral lower extremity edema 07/03/2012  . CKD (chronic kidney disease) stage 3, GFR 30-59 ml/min 07/02/2012  . Knee pain 12/03/2011  . Anemia of renal disease 10/04/2011  . Hyperlipidemia 04/23/2011  . 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 S/P aortic valve replacement 04/05/2011  . Thoracic aortic aneurysm ([place]) 04/05/2011  . Atrial fibrillation ([place]) 04/05/2011  . History of atrial flutter s/p DCCV 04/05/2011  . Coarctation of aorta (previous repair and stent) 04/05/2011  . Hypertension 04/05/2011  . CAD (coronary artery disease)  04/05/2011  . [place] ([place]) 04/03/2011  . Anemia, iron deficiency 04/03/2011    Orientation RESPIRATION BLADDER Height & Weight     Self, Time, Situation  Normal (dialysis) Weight: 287 lb 12.8 oz (130.5 kg) Height:  6\' 2"  (188 cm)  BEHAVIORAL SYMPTOMS/MOOD NEUROLOGICAL BOWEL NUTRITION STATUS      Incontinent    AMBULATORY STATUS COMMUNICATION OF NEEDS Skin   Total Care Verbally Other (Comment)(MASD to inner buttocks)                       Personal Care Assistance Level of Assistance  Bathing, Dressing Bathing Assistance: Maximum assistance   Dressing Assistance: Maximum assistance     Functional Limitations Info             SPECIAL CARE FACTORS FREQUENCY  PT (By licensed PT), OT (By licensed OT)     PT Frequency: 5x/wk OT Frequency: 5x/wk            Contractures Contractures Info: Not present    Additional Factors Info  Code Status, Psychotropic(HD - currently on T, Th, S schedule) Code Status Info: full   Psychotropic Info: See MAR         Current Medications (04/30/2019):  This is the current hospital active medication list Current Facility-Administered Medications  Medication Dose Route Frequency Provider Last Rate Last Admin  . acetaminophen (TYLENOL) tablet 325-650 mg  325-650 mg Oral Q4H PRN Bary Leriche, PA-C   650 mg at 04/18/19 0746  . ALPRAZolam (  XANAX) tablet 0.5 mg  0.5 mg Oral TID Meredith Staggers, MD   0.5 mg at 04/30/19 1433  . calcium carbonate (TUMS - dosed in mg elemental calcium) chewable tablet 200 mg of elemental calcium  1 tablet Oral TID PRN Bary Leriche, PA-C      . Chlorhexidine Gluconate Cloth 2 % PADS 6 each  6 each Topical Q0600 Alric Seton, PA-C   6 each at 04/30/19 [phone]  . Chlorhexidine Gluconate Cloth 2 % PADS 6 each  6 each Topical Q0600 Roney Jaffe, MD   6 each at 04/30/19 [phone]  . colestipol (COLESTID) tablet 4 g  4 g Oral TID Izora Ribas, MD   4 g at 04/30/19 0949  . Darbepoetin Alfa (ARANESP)  injection 200 mcg  200 mcg Intravenous Q Sat-HD Elmarie Shiley, MD   200 mcg at 04/27/19 1349  . diphenhydrAMINE (BENADRYL) 12.5 MG/5ML elixir 12.5-25 mg  12.5-25 mg Oral Q6H PRN Love, Pamela S, PA-C      . diphenoxylate-atropine (LOMOTIL) 2.5-0.025 MG per tablet 1 tablet  1 tablet Oral QID PRN Raulkar, Clide Deutscher, MD   1 tablet at 04/29/19 2326  . Gerhardt's butt cream   Topical QID Meredith Staggers, MD   Given at 04/30/19 1232  . Glycerin (Adult) 2.1 g suppository 1 suppository  1 suppository Rectal Daily PRN Love, Pamela S, PA-C      . guaiFENesin (ROBITUSSIN) 100 MG/5ML solution 100 mg  5 mL Oral Q4H PRN Bary Leriche, PA-C   100 mg at 04/10/19 2235  . metoprolol tartrate (LOPRESSOR) tablet 12.5 mg  12.5 mg Oral BID Jamse Arn, MD   12.5 mg at 04/30/19 0948  . midodrine (PROAMATINE) tablet 10 mg  10 mg Oral TID WC LoveIvan Anchors, PA-C   10 mg at 04/30/19 1425  . multivitamin (RENA-VIT) tablet 1 tablet  1 tablet Oral QHS Bary Leriche, PA-C   1 tablet at 04/29/19 2041  . nystatin cream (MYCOSTATIN)   Topical BID Ranell Patrick Clide Deutscher, MD   Given at 04/29/19 2041  . oxyCODONE (Oxy IR/ROXICODONE) immediate release tablet 5 mg  5 mg Oral Q6H PRN Bary Leriche, PA-C   5 mg at 04/29/19 2326  . pantoprazole (PROTONIX) EC tablet 40 mg  40 mg Oral BID Izora Ribas, MD   40 mg at 04/30/19 0948  . polycarbophil (FIBERCON) tablet 625 mg  625 mg Oral TID WC LoveIvan Anchors, PA-C   625 mg at 04/30/19 1425  . pregabalin (LYRICA) capsule 25 mg  25 mg Oral QHS Meredith Staggers, MD   25 mg at 04/29/19 2325  . pregabalin (LYRICA) capsule 25 mg  25 mg Oral Q T,Th,Sa-HD Meredith Staggers, MD   25 mg at 04/29/19 1244  . prochlorperazine (COMPAZINE) tablet 5-10 mg  5-10 mg Oral Q6H PRN Bary Leriche, PA-C   10 mg at 04/28/19 1525   Or  . prochlorperazine (COMPAZINE) injection 5-10 mg  5-10 mg Intramuscular Q6H PRN Bary Leriche, PA-C   10 mg at 04/24/19 1820   Or  . prochlorperazine (COMPAZINE)  suppository 12.5 mg  12.5 mg Rectal Q6H PRN Love, Pamela S, PA-C      . saccharomyces boulardii (FLORASTOR) capsule 500 mg  500 mg Oral TID Lovorn, Jinny Blossom, MD   500 mg at 04/30/19 1433  . sertraline (ZOLOFT) tablet 100 mg  100 mg Oral Daily Jamse Arn, MD   100 mg at  04/30/19 0947  . sevelamer carbonate (RENVELA) tablet 1,600 mg  1,600 mg Oral TID WC Loren Racer, PA-C   800 mg at 04/30/19 1425  . simethicone (MYLICON) chewable tablet 80 mg  80 mg Oral TID Meredith Staggers, MD   80 mg at 04/30/19 1433  . vitamin A & D ointment   Topical PRN Meredith Staggers, MD   1 application at 41/66/06 2139  . warfarin (COUMADIN) tablet 2 mg  2 mg Oral ONCE-1800 [place] Jude, [facility]      . Warfarin - Pharmacist Dosing Inpatient   Does not apply q1800 Rolla Flatten, [place] at 04/24/19 1800  . zolpidem (AMBIEN) tablet 5 mg  5 mg Oral QHS PRN Bary Leriche, PA-C   5 mg at 04/09/19 2200     Discharge Medications: Please see discharge summary for a list of discharge medications.  Relevant Imaging Results:  Relevant Lab Results:   Additional Information SS# [id number]  Lennart Pall, LCSW

## 2019-04-30 NOTE — Progress Notes (Signed)
[place] PHYSICAL MEDICINE & REHABILITATION PROGRESS NOTE   Subjective/Complaints: Belly feels better today. Stopping metamucil helped a lot. Still working on his daily goals with therapy. Feels that his anxiety is getting better  ROS: Patient denies fever, rash, sore throat, blurred vision, nausea, vomiting, diarrhea, cough,   chest pain, joint or back pain, headache, or mood change.     Objective:   No results found. Recent Labs    04/28/19 0840 04/29/19 1439  WBC 13.5* 15.8*  HGB 8.3* 7.7*  HCT 27.8* 25.3*  PLT 600* 574*   Recent Labs    04/27/19 1515 04/29/19 1431  NA 134* 139  K 3.4* 3.8  CL 95* 100  CO2 25 23  GLUCOSE 104* 113*  BUN 32* 51*  CREATININE 4.36* 7.24*  CALCIUM 8.4* 9.3    Intake/Output Summary (Last 24 hours) at 04/30/2019 0809 Last data filed at 04/29/2019 2100 Gross per 24 hour  Intake 270 ml  Output 0 ml  Net 270 ml     Physical Exam: Vital Signs Blood pressure 121/66, pulse 91, temperature 98 F (36.7 C), temperature source Oral, resp. rate 20, height 6\' 2"  (1.88 m), weight 130.5 kg, SpO2 100 %. Constitutional: No distress . Vital signs reviewed. HEENT: EOMI, oral membranes moist Neck: supple Cardiovascular: RRR without murmur. No JVD    Respiratory: CTA Bilaterally without wheezes or rales. Normal effort    GI: BS +, non-tender, non-distended  Skin: Warm and dry.  Heel blisters/eschar are stable, left more involved than right still. Excoriated/macerated region around buttock improved with only 1-2 small openings --much improved Psych: more up beat and positive. Musc: Lower extremity edema decreased, trace Neurological: Alert Motor: Bilateral upper extremities: 4/5 proximal distal Right lower extremity: 3 to 3+/5 proximal distal Left lower extremity: 3 to 3+/5  Assessment/Plan: 1. Functional deficits secondary to debility which require 3+ hours per day of interdisciplinary therapy in a comprehensive inpatient rehab  setting.  Physiatrist is providing close team supervision and 24 hour management of active medical problems listed below.  Physiatrist and rehab team continue to assess barriers to discharge/monitor patient progress toward functional and medical goals  Care Tool:  Bathing  Bathing activity did not occur: Refused Body parts bathed by patient: Front perineal area, Buttocks         Bathing assist Assist Level: 2 Helpers     Upper Body Dressing/Undressing Upper body dressing   What is the patient wearing?: Pull over shirt    Upper body assist Assist Level: Supervision/Verbal cueing    Lower Body Dressing/Undressing Lower body dressing      What is the patient wearing?: Pants, Incontinence brief     Lower body assist Assist for lower body dressing: Maximal Assistance - Patient 25 - 49%     Toileting Toileting    Toileting assist Assist for toileting: 2 Helpers     Transfers Chair/bed transfer  Transfers assist  Chair/bed transfer activity did not occur: Safety/medical concerns  Chair/bed transfer assist level: 2 Helpers     Locomotion Ambulation   Ambulation assist   Ambulation activity did not occur: Safety/medical concerns          Walk 10 feet activity   Assist  Walk 10 feet activity did not occur: Safety/medical concerns        Walk 50 feet activity   Assist Walk 50 feet with 2 turns activity did not occur: Safety/medical concerns         Walk 150 feet activity  Assist Walk 150 feet activity did not occur: Safety/medical concerns         Walk 10 feet on uneven surface  activity   Assist Walk 10 feet on uneven surfaces activity did not occur: Safety/medical concerns         Wheelchair     Assist Will patient use wheelchair at discharge?: Yes Type of Wheelchair: Manual Wheelchair activity did not occur: Safety/medical concerns  Wheelchair assist level: Minimal Assistance - Patient > 75% Max wheelchair distance:  5'    Wheelchair 50 feet with 2 turns activity    Assist    Wheelchair 50 feet with 2 turns activity did not occur: Safety/medical concerns       Wheelchair 150 feet activity     Assist  Wheelchair 150 feet activity did not occur: Safety/medical concerns       Blood pressure 121/66, pulse 91, temperature 98 F (36.7 C), temperature source Oral, resp. rate 20, height 6\' 2"  (1.88 m), weight 130.5 kg, SpO2 100 %.  Medical Problem List and Plan: 1.  Impaired mobility and ADLs secondary to debility   Continue CIR   -slow progress, continuing to work on transfers, basic mobility. Pt is motivated and perhaps a little more self-limiting d/t improving anxiety  -working on placement 2.  St Jude's Aortic Valve/Antithrombotics:  -DVT/anticoagulation:  Pharmaceutical: Coumadin  INR subtherapeutic on 12/20, discussed with pharmacy, patient refused heparin gtt.  12/30:  INR 2.9             -antiplatelet therapy: N/A 3. Pain Management: Oxycodone prn  -resumed lyrica at hs only 25mg , added additional 25 mg postdialysis 4. Mood: LCSW to follow for evaluation and support.              -antipsychotic agents: N/A  -anxiety disorder/ recent MDD    continue xanax 0.5mg  TID  - improvement in general  -Zoloft increased to home dose of 100 mg on 12/20 5. Neuropsych: This patient is capable of making decisions on his own behalf. 6. Skin/Wound Care: Air mattress overlay for MASD due to ongoing diarrhea.    -gerhardt's cream to buttocks  -rx loose stools  -prevalon boots bilateral heels  -overall improved, tolerates sitting better 7. Fluids/Electrolytes/Nutrition: Strict I/O. Renal diet with 1200 cc FR.  8.   ESRD: Now on HD. Schedule hemodialysis at the end of the day to help with therapy tolerance. TTS schedule   12/10: diet liberalized to help with intake  -volume/edema mgt per nephrology   [facility] Weights   04/29/19 0336 04/29/19 1413 04/30/19 0459  Weight: 130.7 kg 130.7 kg 130.5 kg     Stable on 12/30 9. Hypotension: Monitor BP tid--continue midodrine.    10 History of A fib/A flutter: Monitor HR -on coumadin. Continue Lopressor 12.5mg  BID. 11. Recent GIB/Anemia of chronic disease: aranesp,.       hgb/tranfusion per nephrology-hgb 7.7 12/29 12. Loose stool: C diff negative  -continue fiber  -increased probiotic to 500mg   TID  -diet--reinforced that consistent PO intake is important also  -12/18 stopped megace given it's lack of benefit and potential for loose stool (although was having loose stool long before this was started)  Appreciate GI recs, medication initiated, await further recs  12/22: Lomotil prn is ordered but has not been administered; will place general nursing order to administer.   12/23: Lomotil administered twice yesterday. Patient reports 10 BM yesterday. Will change order to scheduled QID.  12/24: Diarrhea resolved. Will change Lomotil back to prn.  Continue metamucil which has helped stool be more formed. Appreciate pharmacy's input.   12/27: Increased colestipol frequency upon discussion with patient and wife.  12/30: bloating better off metamucil   -continue fibercon and colestipol 13. Cough with phlegm: Guaifenesin prn 14. Shortness of breath at rest: encourage IS, anxiety mgt. Improving 15. Leukocytosis:  -wbc's 13.4 on 12/19, 14.3 on 12/23, 15.8 12/29  Afebrile  -bc negative no growth 16.  Morbid obesity: Encouraged appropriate, regular intake. He's afraid to eat big meals    LOS: 23 days A FACE TO [place] 04/30/2019, 8:09 AM

## 2019-04-30 NOTE — Progress Notes (Signed)
Pt continues to need enc to assist with adls and ROM, wants staff to perform skills for him....ie taking meds, grabbing his water, moving head of bed. Stools are much more formed this am, inner buttocks continues to be raw and bleedin

## 2019-05-01 ENCOUNTER — Inpatient Hospital Stay (HOSPITAL_COMMUNITY): Payer: Medicare Other

## 2019-05-01 ENCOUNTER — Inpatient Hospital Stay (HOSPITAL_COMMUNITY): Payer: Medicare Other | Admitting: Physical Therapy

## 2019-05-01 LAB — CBC
HCT: 27 % — ABNORMAL LOW (ref 39.0–52.0)
Hemoglobin: 7.9 g/dL — ABNORMAL LOW (ref 13.0–17.0)
MCH: 27.3 pg (ref 26.0–34.0)
MCHC: 29.3 g/dL — ABNORMAL LOW (ref 30.0–36.0)
MCV: 93.4 fL (ref 80.0–100.0)
Platelets: 616 10*3/uL — ABNORMAL HIGH (ref 150–400)
RBC: 2.89 MIL/uL — ABNORMAL LOW (ref 4.22–5.81)
RDW: 18 % — ABNORMAL HIGH (ref 11.5–15.5)
WBC: 12.4 10*3/uL — ABNORMAL HIGH (ref 4.0–10.5)
nRBC: 0 % (ref 0.0–0.2)

## 2019-05-01 LAB — RENAL FUNCTION PANEL
Albumin: 2.1 g/dL — ABNORMAL LOW (ref 3.5–5.0)
Anion gap: 15 (ref 5–15)
BUN: 39 mg/dL — ABNORMAL HIGH (ref 8–23)
CO2: 23 mmol/L (ref 22–32)
Calcium: 9.1 mg/dL (ref 8.9–10.3)
Chloride: 98 mmol/L (ref 98–111)
Creatinine, Ser: 6.32 mg/dL — ABNORMAL HIGH (ref 0.61–1.24)
GFR calc Af Amer: 10 mL/min — ABNORMAL LOW (ref >60)
GFR calc non Af Amer: 9 mL/min — ABNORMAL LOW (ref >60)
Glucose, Bld: 106 mg/dL — ABNORMAL HIGH (ref 70–99)
Phosphorus: 2.5 mg/dL (ref 2.5–4.6)
Potassium: 4 mmol/L (ref 3.5–5.1)
Sodium: 136 mmol/L (ref 135–145)

## 2019-05-01 LAB — PROTIME-INR
INR: 2.1 — ABNORMAL HIGH (ref 0.8–1.2)
Prothrombin Time: 23.7 seconds — ABNORMAL HIGH (ref 11.4–15.2)

## 2019-05-01 LAB — PREPARE RBC (CROSSMATCH)

## 2019-05-01 MED ORDER — HEPARIN SODIUM (PORCINE) 1000 UNIT/ML DIALYSIS
4000.0000 [IU] | INTRAMUSCULAR | Status: AC | PRN
  Administered 2019-05-01: 4000 [IU] via INTRAVENOUS_CENTRAL

## 2019-05-01 MED ORDER — SODIUM CHLORIDE 0.9 % IV SOLN
125.0000 mg | INTRAVENOUS | Status: DC
  Administered 2019-05-01 – 2019-05-08 (×3): 125 mg via INTRAVENOUS
  Filled 2019-05-01 (×7): qty 10

## 2019-05-01 MED ORDER — CHLORHEXIDINE GLUCONATE CLOTH 2 % EX PADS
6.0000 | MEDICATED_PAD | Freq: Every day | CUTANEOUS | Status: DC
  Administered 2019-05-02: 6 via TOPICAL

## 2019-05-01 MED ORDER — SODIUM CHLORIDE 0.9% IV SOLUTION: Freq: Once | INTRAVENOUS | Status: DC

## 2019-05-01 MED ORDER — HEPARIN SODIUM (PORCINE) 1000 UNIT/ML IJ SOLN
INTRAMUSCULAR | Status: AC
  Administered 2019-05-01: 4000 [IU] via INTRAVENOUS_CENTRAL
  Filled 2019-05-01: qty 4

## 2019-05-01 MED ORDER — PSYLLIUM 95 % PO PACK
1.0000 | PACK | Freq: Every day | ORAL | Status: DC
  Administered 2019-05-02: 08:00:00 1 via ORAL
  Filled 2019-05-01 (×3): qty 1

## 2019-05-01 MED ORDER — HEPARIN SODIUM (PORCINE) 1000 UNIT/ML IJ SOLN
INTRAMUSCULAR | Status: AC
  Filled 2019-05-01: qty 4

## 2019-05-01 MED ORDER — HEPARIN SODIUM (PORCINE) 1000 UNIT/ML IJ SOLN
INTRAMUSCULAR | Status: AC
  Administered 2019-05-01: 4700 [IU]
  Filled 2019-05-01: qty 1

## 2019-05-01 MED ORDER — WARFARIN SODIUM 4 MG PO TABS
4.0000 mg | ORAL_TABLET | Freq: Once | ORAL | Status: AC
  Administered 2019-05-01: 4 mg via ORAL
  Filled 2019-05-01: qty 1

## 2019-05-01 NOTE — Progress Notes (Signed)
Physical Therapy Session Note  Patient Details  Name: Calvin [name] MRN: [id number] Date of Birth: 23-Oct-1955  Today's Date: 05/01/2019 PT Individual Time: 8882-8003 PT Individual Time Calculation (min): 40 min   Short Term Goals: Week 4:  PT Short Term Goal 1 (Week 4): Pt will participate in 60 min of upright activity w/ minimal increase in fatigue PT Short Term Goal 2 (Week 4): Pt will maintain upright sitting at EOB w/ supervision x15 min consistently  Skilled Therapeutic Interventions/Progress Updates:   Pt in supine and agreeable to therapy, denies pain. About to transfer to seated when pt reported he had BM. R/L rolling for pericare and brief management, verbal/tactile/manual cues to push through heel to roll opposite direction. Pt incontinent of bowel. Supine>sit w/ mod-max assist w/ verbal cues for technique. [name] transfer to TIS w/ max-total assist +2. Tactile and verbal cues for BUE placement. Worked on BLE strengthening in tilted position in [place]. LAQs 3x15, adduction squeezes 2x10, isometric abduction 2x10, knee marches 1x10, and heel slides 2x10. Discussed performing these outside of therapy and wrote reminder for pt on wall w/ names of exercises and rep #, pt in agreement and demonstrated safe performance. Ended session tilted in TIS, all needs in reach. Pt's BLEs positioned w/ use of gait belt into neutral alignment instead of excessive ER and abduction.   Therapy Documentation Precautions:  Precautions Precautions: Fall Precaution Comments: monitor HR, L heel wound Restrictions Weight Bearing Restrictions: (P) No Vital Signs: Therapy Vitals Temp: 97.7 F (36.5 C) Temp Source: Oral Pulse Rate: 88 Resp: 20 BP: (!) 123/49 Patient Position (if appropriate): Lying Oxygen Therapy SpO2: 100 % O2 Device: Room Air  Therapy/Group: Individual Therapy  Danta Baumgardner Clent Demark 05/01/2019, 8:48 AM

## 2019-05-01 NOTE — Progress Notes (Signed)
ANTICOAGULATION CONSULT NOTE - Follow Up Consult  Pharmacy Consult for Warfarin Indication: atrial fibrillation and St. Jude AVR (5929W)  Patient Measurements: Height: 6\' 2"  (188 cm) Weight: 289 lb 11 oz (131.4 kg) IBW/kg (Calculated) : 82.2  Vital Signs: Temp: 97.7 F (36.5 C) (12/31 0534) Temp Source: Oral (12/31 0534) BP: 123/49 (12/31 0534) Pulse Rate: 88 (12/31 0534)  Labs: Recent Labs    04/29/19 0546 04/29/19 1431 04/29/19 1439 04/30/19 0514 05/01/19 0601  HGB  --   --  7.7*  --  7.9*  HCT  --   --  25.3*  --  27.0*  PLT  --   --  574*  --  616*  LABPROT 30.8*  --   --  26.9* 23.7*  INR 3.0*  --   --  2.5* 2.1*  CREATININE  --  7.24*  --   --   --     Estimated Creatinine Clearance: 15.2 mL/min (A) (by C-G formula based on SCr of 7.24 mg/dL (H)).  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 Goal INR range should be 2.5-3.5 given indication; however, per discussion with patient, he was told to aim for 2.5 in mid 2019 by his MD due to history of GIB. Per chart review, appears that GIB events are related to procedures (polypectomy) and GI ulcers.   12/17 Received vitamin K PO 1mg  due to INR 5.4.  Note, patient does not eat any vegetables. Did not have bleeding other than small amounts from rectal wounds.  Patient has required smaller warfarin doses this admission than his reported PTA warfarin dose: 5 mg daily.  However, INR has varied widely 1.9 >> 5 and has been difficult to maintain narrow goal of 2.5-3.  INR today down to 2.1, will increase dose.   Goal of Therapy:   INR 2.5-3   Plan:  - Warfarin 4mg  tonight - Monitor daily INR, Q72hr CBC, and S/S of bleeding   [employer], Pharm.D., BCPS Clinical Pharmacist Clinical phone for 05/01/2019 from 8:30-4:00 is [phone].  **Pharmacist phone directory can be found on [email or web address] listed under [place].  05/01/2019 9:20 AM

## 2019-05-01 NOTE — Progress Notes (Signed)
[facility] PH[facility]ICAL MEDICINE & REHABILITATION PROGRESS NOTE   Subjective/Complaints: Up in w/c in tilt. No new issues. Had a pretty good night. Doing his "exercises"   ROS: Patient denies fever, rash, sore throat, blurred vision, nausea, vomiting,   cough, shortness of breath or chest pain, joint or back pain, headache, or mood change.     Objective:   No results found. Recent Labs    04/29/19 1439 05/01/19 0601  WBC 15.8* 12.4*  HGB 7.7* 7.9*  HCT 25.3* 27.0*  PLT 574* 616*   Recent Labs    04/29/19 1431  NA 139  K 3.8  CL 100  CO2 23  GLUCOSE 113*  BUN 51*  CREATININE 7.24*  CALCIUM 9.3    Intake/Output Summary (Last 24 hours) at 05/01/2019 1049 Last data filed at 05/01/2019 0836 Gross per 24 hour  Intake 120 ml  Output --  Net 120 ml     Physical Exam: Vital Signs Blood pressure (!) 123/49, pulse 88, temperature 97.7 F (36.5 C), temperature source Oral, resp. rate 20, height 6\' 2"  (1.88 m), weight 131.4 kg, SpO2 100 %. Constitutional: No distress . Vital signs reviewed. HEENT: EOMI, oral membranes moist Neck: supple Cardiovascular: RRR without murmur. No JVD    Respiratory: CTA Bilaterally without wheezes or rales. Normal effort    GI: BS +, non-tender, non-distended  Skin: Warm and dry.  Heel blisters/eschar are stable, left more involved than right still. Excoriated/macerated region around buttock improved with only 1-2 small openings -not visualized today Psych: positive Musc: Lower extremity edema decreased, trace Neurological: Alert Motor: Bilateral upper extremities: 4/5 proximal distal Right lower extremity: 3 to 3+/5 proximal distal Left lower extremity: 3 to 3+/5--stable  Assessment/Plan: 1. Functional deficits secondary to debility which require 3+ hours per day of interdisciplinary therapy in a comprehensive inpatient rehab setting.  Physiatrist is providing close team supervision and 24 hour management of active medical problems  listed below.  Physiatrist and rehab team continue to assess barriers to discharge/monitor patient progress toward functional and medical goals  Care Tool:  Bathing  Bathing activity did not occur: Refused Body parts bathed by patient: Front perineal area, Buttocks         Bathing assist Assist Level: 2 Helpers     Upper Body Dressing/Undressing Upper body dressing   What is the patient wearing?: Pull over shirt    Upper body assist Assist Level: Supervision/Verbal cueing    Lower Body Dressing/Undressing Lower body dressing      What is the patient wearing?: Pants, Incontinence brief     Lower body assist Assist for lower body dressing: Maximal Assistance - Patient 25 - 49%     Toileting Toileting    Toileting assist Assist for toileting: 2 Helpers     Transfers Chair/bed transfer  Transfers assist  Chair/bed transfer activity did not occur: Safety/medical concerns  Chair/bed transfer assist level: 2 Helpers     Locomotion Ambulation   Ambulation assist   Ambulation activity did not occur: Safety/medical concerns          Walk 10 feet activity   Assist  Walk 10 feet activity did not occur: Safety/medical concerns        Walk 50 feet activity   Assist Walk 50 feet with 2 turns activity did not occur: Safety/medical concerns         Walk 150 feet activity   Assist Walk 150 feet activity did not occur: Safety/medical concerns  Walk 10 feet on uneven surface  activity   Assist Walk 10 feet on uneven surfaces activity did not occur: Safety/medical concerns         Wheelchair     Assist Will patient use wheelchair at discharge?: Yes Type of Wheelchair: Manual Wheelchair activity did not occur: Safety/medical concerns  Wheelchair assist level: Minimal Assistance - Patient > 75% Max wheelchair distance: 5'    Wheelchair 50 feet with 2 turns activity    Assist    Wheelchair 50 feet with 2 turns activity  did not occur: Safety/medical concerns       Wheelchair 150 feet activity     Assist  Wheelchair 150 feet activity did not occur: Safety/medical concerns       Blood pressure (!) 123/49, pulse 88, temperature 97.7 F (36.5 C), temperature source Oral, resp. rate 20, height 6\' 2"  (1.88 m), weight 131.4 kg, SpO2 100 %.  Medical Problem List and Plan: 1.  Impaired mobility and ADLs secondary to debility   Continue CIR   -slow progress, continuing to work on transfers, basic mobility. Pt is motivated and perhaps a little more self-limiting d/t improving anxiety  -working on placement 2.  [employer]'s Aortic Valve/Antithrombotics:  -DVT/anticoagulation:  Pharmaceutical: Coumadin  12/31:  INR 2.1---adjust coumadin dosing per pharmacy, pt has refused heparin coverage previously             -antiplatelet therapy: N/A 3. Pain Management: Oxycodone prn  -resumed lyrica at hs only 25mg , added additional 25 mg postdialysis 4. Mood: LCSW to follow for evaluation and support.              -antipsychotic agents: N/A  -anxiety disorder/ recent MDD    continue xanax 0.5mg  TID  - improvement in general  -Zoloft increased to home dose of 100 mg on 12/20 5. Neuropsych: This patient is capable of making decisions on his own behalf. 6. Skin/Wound Care: Air mattress overlay for MASD due to ongoing diarrhea.    -gerhardt's cream to buttocks  -rx loose stools  -prevalon boots bilateral heels  -overall improved, tolerates sitting better 7. Fluids/Electrolytes/Nutrition: Strict I/O. Renal diet with 1200 cc FR.  8.   ESRD: Now on HD. Schedule hemodialysis at the end of the day to help with therapy tolerance. TTS schedule   12/10: diet liberalized to help with intake  -volume/edema mgt per nephrology   [facility] Weights   04/29/19 1413 04/30/19 0459 05/01/19 0534  Weight: 130.7 kg 130.5 kg 131.4 kg    Stable on 12/31 9. Hypotension: Monitor BP tid--continue midodrine.    10 History of A fib/A  flutter: Monitor HR -on coumadin. Continue Lopressor 12.5mg  BID. 11. Recent GIB/Anemia of chronic disease: aranesp,.       hgb/tranfusion per nephrology-hgb 7.9 12/31 12. Loose stool: C diff negative  -continue fiber  -increased probiotic to 500mg   TID  -diet--reinforced that consistent PO intake is important also  -12/18 stopped megace given it's lack of benefit and potential for loose stool (although was having loose stool long before this was started)  Appreciate GI recs, medication initiated, await further recs  12/22: Lomotil prn is ordered but has not been administered; will place general nursing order to administer.   12/23: Lomotil administered twice yesterday. Patient reports 10 BM yesterday. Will change order to scheduled QID.  12/24: Diarrhea resolved. Will change Lomotil back to prn. Continue metamucil which has helped stool be more formed. Appreciate pharmacy's input.   12/27: Increased colestipol frequency upon  discussion with patient and wife.  12/30: bloating better off metamucil   -continue fibercon and colestipol  12/31: resume metamucil once daily only as stool more mushy again 13. Cough with phlegm: Guaifenesin prn 14. Shortness of breath at rest: encourage IS, anxiety mgt. Improving 15. Leukocytosis:  -wbc's 13.4 on 12/19, 14.3 on 12/23, 15.8 12/29--> 12.4 12/31  Afebrile  -bc negative no growth 16.  Morbid obesity: Encouraged appropriate, regular intake. He's afraid to eat big meals    LOS: 24 days A FACE TO FACE EVALUATION WAS PERFORMED  Meredith Staggers 05/01/2019, 10:49 AM

## 2019-05-01 NOTE — Progress Notes (Signed)
Occupational Therapy Session Note  Patient Details  Name: Calvin [name] MRN: [id number] Date of Birth: 1955/12/20  Today's Date: 05/01/2019 OT Individual Time: 9791-5041 OT Individual Time Calculation (min): 30 min    Short Term Goals: Week 4:  OT Short Term Goal 1 (Week 4): STG=LTG d/t ELOS  Skilled Therapeutic Interventions/Progress Updates:    Pt received recliner in TIS w/c with c /o butt being sore. Pt requesting to complete oral care and grooming tasks at the sink. Pt able to do so with set up assist. Pt doffed shirt with min A with cueing for technique. Mod A to don shirt, pt reporting he is "losing steam" about 5 min into session. Pt requested to return to bed. Transfer set up with beasy board. Pt requiring heavy max +2 assist with little assistance from pt for transfer. Max A to return supine. Peri care 2/2 incontinent BM completed with total A +2. Pt was left supine with all needs met, bed alarm set.   Therapy Documentation Precautions:  Precautions Precautions: Fall Precaution Comments: monitor HR, L heel wound Restrictions Weight Bearing Restrictions: (P) No   Therapy/Group: Individual Therapy  Curtis Sites 05/01/2019, 6:49 AM

## 2019-05-01 NOTE — Progress Notes (Signed)
Patient ID: Calvin [name], male   DOB: 1956-04-23, 63 y.o.   MRN: [id number]  [facility] Progress Note   Assessment/ Plan:   1.  S/P mechanical aortic valve replacement w/ stent repair of coarctation of aorta: With significant deconditioning and reports to be making slow but gradual progress with inpatient rehabilitation with ongoing management of musculoskeletal pain. 2. ESRD: cont TTS HD schedule. HD today.Marland Kitchen  He would like to transition back to peritoneal dialysis at some point as an outpatient, he will continue these discussions with his outpatient nephrologist (Dr. Olivia Mackie). 3. Anemia ckd: Hb drifting down, pt says gets very tired w/ Hb < 8. Will need prbc x 2, give w/ HD today. Check FOB. Consult pharm for esa/ IV fe.  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 5. Nutrition: Continue current renal diet with ongoing ONS/protein supplementation. 6. Hypotension/ volume: on midodrine 10 tid and metop 12.5 bid.  10kg down from admit wt and was getting dry so we are letting vol come up now, feeling better.   [name], MD 05/01/2019, 9:49 AM    Subjective:   Very tired, Hb 7.5- 8.0 now, slow drift down.    Objective:   BP (!) 123/49 (BP Location: Right Arm)   Pulse 88   Temp 97.7 F (36.5 C) (Oral)   Resp 20   Ht 6\' 2"  (1.88 m)   Wt 131.4 kg   SpO2 100%   BMI 37.19 kg/m   Physical Exam: Gen: Appears to be comfortable resting in bed CVS: Pulse regular rhythm, normal rate, mechanical S2 click audible Resp: Clear to auscultation, no rales/rhonchi.  Right IJ TDC. Abd: Soft, moderate global distention, nontender Ext: With trace ankle edema bilaterally  Labs: BMET Recent Labs  Lab 04/27/19 1515 04/29/19 1431  NA 134* 139  K 3.4* 3.8  CL 95* 100  CO2 25 23  GLUCOSE 104* 113*  BUN 32* 51*  CREATININE 4.36* 7.24*  CALCIUM 8.4* 9.3  PHOS 1.8* 3.2   CBC Recent Labs  Lab  04/27/19 1515 04/28/19 0840 04/29/19 1439 05/01/19 0601  WBC 15.2* 13.5* 15.8* 12.4*  HGB 8.0* 8.3* 7.7* 7.9*  HCT 26.5* 27.8* 25.3* 27.0*  MCV 90.4 90.6 92.0 93.4  PLT 564* 600* 574* 616*      Medications:    . ALPRAZolam  0.5 mg Oral TID  . Chlorhexidine Gluconate Cloth  6 each Topical Q0600  . Chlorhexidine Gluconate Cloth  6 each Topical Q0600  . colestipol  4 g Oral TID  . darbepoetin (ARANESP) injection - DIALYSIS  200 mcg Intravenous Q Sat-HD  . Gerhardt's butt cream   Topical QID  . metoprolol tartrate  12.5 mg Oral BID  . midodrine  10 mg Oral TID WC  . multivitamin  1 tablet Oral QHS  . nystatin cream   Topical BID  . pantoprazole  40 mg Oral BID  . polycarbophil  625 mg Oral TID WC  . pregabalin  25 mg Oral QHS  . pregabalin  25 mg Oral Q T,Th,Sa-HD  . saccharomyces boulardii  500 mg Oral TID  . sertraline  100 mg Oral Daily  . sevelamer carbonate  1,600 mg Oral TID WC  . simethicone  80 mg Oral TID  . warfarin  4 mg Oral ONCE-1800  . Warfarin - Pharmacist Dosing Inpatient   Does not apply [phone]

## 2019-05-02 ENCOUNTER — Inpatient Hospital Stay (HOSPITAL_COMMUNITY): Payer: Medicare Other | Admitting: Occupational Therapy

## 2019-05-02 ENCOUNTER — Inpatient Hospital Stay (HOSPITAL_COMMUNITY): Payer: Medicare Other | Admitting: Physical Therapy

## 2019-05-02 LAB — TYPE AND SCREEN
ABO/RH(D): O POS
Antibody Screen: NEGATIVE
Unit division: 0
Unit division: 0

## 2019-05-02 LAB — BPAM RBC
Blood Product Expiration Date: 202101292359
Blood Product Expiration Date: 202101292359
ISSUE DATE / TIME: 202012311414
ISSUE DATE / TIME: 202012311414
Unit Type and Rh: 5100
Unit Type and Rh: 5100

## 2019-05-02 LAB — PROTIME-INR
INR: 1.7 — ABNORMAL HIGH (ref 0.8–1.2)
Prothrombin Time: 20.2 seconds — ABNORMAL HIGH (ref 11.4–15.2)

## 2019-05-02 MED ORDER — WARFARIN SODIUM 5 MG PO TABS
5.0000 mg | ORAL_TABLET | Freq: Once | ORAL | Status: AC
  Administered 2019-05-02: 5 mg via ORAL
  Filled 2019-05-02: qty 1

## 2019-05-02 MED ORDER — CHLORHEXIDINE GLUCONATE CLOTH 2 % EX PADS
6.0000 | MEDICATED_PAD | Freq: Every day | CUTANEOUS | Status: DC
  Administered 2019-05-03 – 2019-05-09 (×5): 6 via TOPICAL

## 2019-05-02 MED ORDER — ZINC OXIDE 11.3 % EX CREA
TOPICAL_CREAM | Freq: Four times a day (QID) | CUTANEOUS | Status: DC
  Administered 2019-05-06 – 2019-05-07 (×2): 1 via TOPICAL
  Filled 2019-05-02 (×5): qty 56

## 2019-05-02 NOTE — Progress Notes (Signed)
Pt complained of nausea and abdominal distention. Positive bowel sounds. Compazine tablet given at [place]. Eventually pt vomited brown liquid with white chunks. After vomiting he felt a little better although still nauseous. Pt took most of his pills and resting now. Dr. Dagoberto Ligas informed.

## 2019-05-02 NOTE — Progress Notes (Signed)
Occupational Therapy Session Note  Patient Details  Name: Calvin [name] MRN: [id number] Date of Birth: 05/02/1955  Today's Date: 05/02/2019 OT Individual Time: 5830-9407 OT Individual Time Calculation (min): 31 min   Skilled Therapeutic Interventions/Progress Updates:    Pt greeted in bed with spouse present. Agreeable to transferring to the w/c with encouragement. Wanting to use the [name] vs [employer]. Supine<sit completed with Max A. Pt able to laterally lean onto elbow while OT lifted R LE for board placement. Beazy board<TIS completed with +2 assist. +2 for recripricol scooting back in TIS, and then pt was reclined in TIS to assist with scooting hips back further. Escorted pt to the dayroom and instructed pt in bilateral leg raises 5 sets 2 sets, encouraged maintaining lifted position for strength building. Educated his spouse on how to change tilted position of TIS for pressure relief. Pt agreeable to remain in TIS for an hour. Staff aware of time when pt will need transfer assistance back to bed. Spouse aware that she cannot assist pt in transfers at this time. Pt left in dayroom with spouse, reclined in TIS with safety belt fastened and wearing his face mask.    Therapy Documentation Precautions:  Precautions Precautions: Fall Precaution Comments: monitor HR, L heel wound Restrictions Weight Bearing Restrictions: No Vital Signs: Therapy Vitals Temp: 98.7 F (37.1 C) Pulse Rate: 86 Resp: 18 BP: (!) 130/54 Patient Position (if appropriate): Lying Oxygen Therapy SpO2: 100 % O2 Device: Room Air Pain: buttocks pain, he reported relief when tilted back in TIS   ADL: ADL Eating: Supervision/safety Where Assessed-Eating: Bed level Grooming: Supervision/safety Where Assessed-Grooming: Bed level Upper Body Bathing: Moderate assistance Where Assessed-Upper Body Bathing: Bed level Lower Body Bathing: Dependent Where Assessed-Lower Body Bathing: Bed level Upper Body Dressing:  Maximal assistance Where Assessed-Upper Body Dressing: Edge of bed Lower Body Dressing: Dependent Where Assessed-Lower Body Dressing: Bed level Toileting: Dependent Where Assessed-Toileting: Bed level Toilet Transfer: Unable to assess     Therapy/Group: Individual Therapy  [name] [name] 05/02/2019, 4:15 PM

## 2019-05-02 NOTE — Progress Notes (Signed)
Patient ID: Calvin [name], male   DOB: 05/23/1955, 64 y.o.   MRN: [id number]  [facility] Progress Note   Assessment/ Plan:   1.  S/P mechanical aortic valve replacement w/ stent repair of coarctation of aorta: With significant deconditioning and reports to be making slow but gradual progress with inpatient rehabilitation with ongoing management of musculoskeletal pain. 2. ESRD: cont TTS HD schedule. He would like to transition back to peritoneal dialysis at some point as an outpatient, he will continue these discussions with his outpatient nephrologist (Dr. Olivia Mackie). HD tomorrow.  3. Anemia ckd: Hb drifting down in 7's, pt says gets very tired w/ Hb < 8. Got 2u prbc on 12/31, no Hb done today. Starting darbe 200ug qSat and 1gm nulecit IV Fe load.  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 5. Nutrition: Continue current renal diet with ongoing ONS/protein supplementation. 6. Hypotension/ volume: on midodrine 10 tid and metop 12.5 bid.  10kg down from admit wt and was looking dry so no UF on HD this week and wt's are stable w/o signs of vol excess on exam.   [name], MD 05/02/2019, 12:35 PM    Subjective:   No new c/o's.    Objective:   BP (!) 113/44 (BP Location: Right Arm)   Pulse 88   Temp 97.9 F (36.6 C) (Oral)   Resp 18   Ht 6\' 2"  (1.88 m)   Wt 131.1 kg   SpO2 97%   BMI 37.11 kg/m   Physical Exam: Gen: Appears to be comfortable resting in bed CVS: Pulse regular rhythm, normal rate, mechanical S2 click audible Resp: Clear to auscultation, no rales/rhonchi.  Right IJ TDC. Abd: Soft, moderate global distention, nontender Ext: no sig edema bilaterally  Labs: BMET Recent Labs  Lab 04/27/19 1515 04/29/19 1431 05/01/19 1409  NA 134* 139 136  K 3.4* 3.8 4.0  CL 95* 100 98  CO2 25 23 23   GLUCOSE 104* 113* 106*  BUN 32* 51* 39*  CREATININE 4.36* 7.24* 6.32*  CALCIUM 8.4* 9.3 9.1   PHOS 1.8* 3.2 2.5   CBC Recent Labs  Lab 04/27/19 1515 04/28/19 0840 04/29/19 1439 05/01/19 0601  WBC 15.2* 13.5* 15.8* 12.4*  HGB 8.0* 8.3* 7.7* 7.9*  HCT 26.5* 27.8* 25.3* 27.0*  MCV 90.4 90.6 92.0 93.4  PLT 564* 600* 574* 616*      Medications:    . sodium chloride   Intravenous Once  . ALPRAZolam  0.5 mg Oral TID  . Chlorhexidine Gluconate Cloth  6 each Topical Q0600  . Chlorhexidine Gluconate Cloth  6 each Topical Q0600  . Chlorhexidine Gluconate Cloth  6 each Topical Q0600  . colestipol  4 g Oral TID  . darbepoetin (ARANESP) injection - DIALYSIS  200 mcg Intravenous Q Sat-HD  . Gerhardt's butt cream   Topical QID  . metoprolol tartrate  12.5 mg Oral BID  . midodrine  10 mg Oral TID WC  . multivitamin  1 tablet Oral QHS  . nystatin cream   Topical BID  . pantoprazole  40 mg Oral BID  . polycarbophil  625 mg Oral TID WC  . pregabalin  25 mg Oral QHS  . pregabalin  25 mg Oral Q T,Th,Sa-HD  . psyllium  1 packet Oral Daily  . saccharomyces boulardii  500 mg Oral TID  . sertraline  100 mg Oral Daily  . sevelamer carbonate  1,600 mg Oral TID  WC  . simethicone  80 mg Oral TID  . Warfarin - Pharmacist Dosing Inpatient   Does not apply [phone]

## 2019-05-02 NOTE — Progress Notes (Signed)
[facility] PHYSICAL MEDICINE & REHABILITATION PROGRESS NOTE   Subjective/Complaints: Pt excited that he was able to stand in steady for 30 seconds. Was out of breath in bed afterwards. Taking short rapid breaths  ROS: Patient denies fever, rash, sore throat, blurred vision, nausea, vomiting, diarrhea, cough,  chest pain, joint or back pain, headache, or mood change.       Objective:   No results found. Recent Labs    04/29/19 1439 05/01/19 0601  WBC 15.8* 12.4*  HGB 7.7* 7.9*  HCT 25.3* 27.0*  PLT 574* 616*   Recent Labs    04/29/19 1431 05/01/19 1409  NA 139 136  K 3.8 4.0  CL 100 98  CO2 23 23  GLUCOSE 113* 106*  BUN 51* 39*  CREATININE 7.24* 6.32*  CALCIUM 9.3 9.1    Intake/Output Summary (Last 24 hours) at 05/02/2019 1032 Last data filed at 05/02/2019 0600 Gross per 24 hour  Intake 545.84 ml  Output 0 ml  Net 545.84 ml     Physical Exam: Vital Signs Blood pressure (!) 113/44, pulse 88, temperature 97.9 F (36.6 C), temperature source Oral, resp. rate 18, height 6\' 2"  (1.88 m), weight 131.1 kg, SpO2 97 %. Constitutional: out of breath . Vital signs reviewed. HEENT: EOMI, oral membranes moist Neck: supple Cardiovascular: RRR without murmur. No JVD    Respiratory: CTA Bilaterally without wheezes or rales. Normal effort    GI: BS +, non-tender, non-distended Constitutional: No distress . Vital signs reviewed. HEENT: EOMI, oral membranes moist Neck: supple Cardiovascular: RRR without murmur. No JVD    Respiratory: CTA Bilaterally without wheezes or rales. Taking shallow, rapid breaths. GI: BS +, non-tender, non-distended  Skin: Warm and dry.  Heel blisters/eschar are stable, left more involved than right still. Excoriated/macerated region around buttock improved with only 1-2 small openings -not visualized today Psych: positive, sl anxious Musc: Lower extremity edema decreased, trace Neurological: Alert Motor: Bilateral upper extremities: 4/5 proximal  distal Right lower extremity: 3 to 3+/5 proximal distal Left lower extremity: 3 to 3+/5--stable  Assessment/Plan: 1. Functional deficits secondary to debility which require 3+ hours per day of interdisciplinary therapy in a comprehensive inpatient rehab setting.  Physiatrist is providing close team supervision and 24 hour management of active medical problems listed below.  Physiatrist and rehab team continue to assess barriers to discharge/monitor patient progress toward functional and medical goals  Care Tool:  Bathing  Bathing activity did not occur: Refused Body parts bathed by patient: Front perineal area, Buttocks         Bathing assist Assist Level: 2 Helpers     Upper Body Dressing/Undressing Upper body dressing   What is the patient wearing?: Pull over shirt    Upper body assist Assist Level: Supervision/Verbal cueing    Lower Body Dressing/Undressing Lower body dressing      What is the patient wearing?: Pants, Incontinence brief     Lower body assist Assist for lower body dressing: Maximal Assistance - Patient 25 - 49%     Toileting Toileting    Toileting assist Assist for toileting: 2 Helpers     Transfers Chair/bed transfer  Transfers assist  Chair/bed transfer activity did not occur: Safety/medical concerns  Chair/bed transfer assist level: 2 Helpers     Locomotion Ambulation   Ambulation assist   Ambulation activity did not occur: Safety/medical concerns          Walk 10 feet activity   Assist  Walk 10 feet activity did not  occur: Safety/medical concerns        Walk 50 feet activity   Assist Walk 50 feet with 2 turns activity did not occur: Safety/medical concerns         Walk 150 feet activity   Assist Walk 150 feet activity did not occur: Safety/medical concerns         Walk 10 feet on uneven surface  activity   Assist Walk 10 feet on uneven surfaces activity did not occur: Safety/medical concerns          Wheelchair     Assist Will patient use wheelchair at discharge?: Yes Type of Wheelchair: Manual Wheelchair activity did not occur: Safety/medical concerns  Wheelchair assist level: Minimal Assistance - Patient > 75% Max wheelchair distance: 5'    Wheelchair 50 feet with 2 turns activity    Assist    Wheelchair 50 feet with 2 turns activity did not occur: Safety/medical concerns       Wheelchair 150 feet activity     Assist  Wheelchair 150 feet activity did not occur: Safety/medical concerns       Blood pressure (!) 113/44, pulse 88, temperature 97.9 F (36.6 C), temperature source Oral, resp. rate 18, height 6\' 2"  (1.88 m), weight 131.1 kg, SpO2 97 %.  Medical Problem List and Plan: 1.  Impaired mobility and ADLs secondary to debility   Continue CIR   1/1 -slow progress, working on standing/transfers. Was able to stand for first time todaay!  -working on placement 2.  St Jude's Aortic Valve/Antithrombotics:  -DVT/anticoagulation:  Pharmaceutical: Coumadin  12/31:  INR 2.1, given 4mg  coumadin  1/1 INR down to 1.6. dosing adjustment today per pharmacy. May need to consider heparin coverage.             -antiplatelet therapy: N/A 3. Pain Management: Oxycodone prn  -resumed lyrica at hs only 25mg , added additional 25 mg postdialysis 4. Mood: LCSW to follow for evaluation and support.              -antipsychotic agents: N/A  -anxiety disorder/ recent MDD    continue xanax 0.5mg  TID  - improvement in general  -Zoloft increased to home dose of 100 mg on 12/20 5. Neuropsych: This patient is capable of making decisions on his own behalf. 6. Skin/Wound Care: Air mattress overlay for MASD due to loose stool.    -gerhardt's cream to buttocks  -rx loose stools  -prevalon boots bilateral heels  -slowly improving 7. Fluids/Electrolytes/Nutrition: Strict I/O. Renal diet with 1200 cc FR.  8.   ESRD: Now on HD. Schedule hemodialysis at the end of the day to help  with therapy tolerance. TTS schedule   12/10: diet liberalized to help with intake  -volume/edema mgt per nephrology   [facility] Weights   05/01/19 1350 05/01/19 1730 05/02/19 0630  Weight: 131.4 kg 129.9 kg 131.1 kg    Stable on 1/1 9. Hypotension: Monitor BP tid--continue midodrine.    10 History of A fib/A flutter: Monitor HR -on coumadin. Continue Lopressor 12.5mg  BID. 11. Recent GIB/Anemia of chronic disease: aranesp,.       hgb/tranfusion per nephrology-received 2u prbc 12/31 12. Loose stool: C diff negative  -continue fiber  -increased probiotic to 500mg   TID  -diet--reinforced that consistent PO intake is important also  -12/18 stopped megace given it's lack of benefit and potential for loose stool (although was having loose stool long before this was started)  Appreciate GI recs, medication initiated, await further recs  12/24: Diarrhea  resolved. Will change Lomotil back to prn. Continue metamucil which has helped stool be more formed. Appreciate pharmacy's input.   12/27: Increased colestipol frequency upon discussion with patient and wife.  12/30: bloating better off metamucil   -continue fibercon and colestipol  12/31: resumed metamucil once daily only as stool more mushy again  1/1: continue with current plan 13. Cough with phlegm: Guaifenesin prn 14. Shortness of breath at rest: encourage IS, anxiety mgt. Improving 15. Leukocytosis:  -wbc's 13.4 on 12/19, 14.3 on 12/23, 15.8 12/29--> 12.4 12/31  Afebrile  -bc negative no growth 16.  Morbid obesity: Encouraged appropriate, regular intake. He's afraid to eat big meals    LOS: 25 days A FACE TO [facility] 05/02/2019, 10:32 AM

## 2019-05-02 NOTE — Progress Notes (Signed)
ANTICOAGULATION CONSULT NOTE - Follow Up Consult  Pharmacy Consult for Warfarin Indication: atrial fibrillation and St. Jude AVR (1990s)  Patient Measurements: Height: 6\' 2"  (188 cm) Weight: 289 lb 0.4 oz (131.1 kg) IBW/kg (Calculated) : 82.2  Vital Signs: Temp: 97.9 F (36.6 C) (01/01 0630) Temp Source: Oral (01/01 0630) BP: 113/44 (01/01 0630) Pulse Rate: 88 (01/01 0630)  Labs: Recent Labs    04/30/19 0514 05/01/19 0601 05/01/19 1409 05/02/19 0510  HGB  --  7.9*  --   --   HCT  --  27.0*  --   --   PLT  --  616*  --   --   LABPROT 26.9* 23.7*  --  20.2*  INR 2.5* 2.1*  --  1.7*  CREATININE  --   --  6.32*  --     Estimated Creatinine Clearance: 17.4 mL/min (A) (by C-G formula based on SCr of 6.32 mg/dL (H)).   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 Goal INR range should be 2.5-3.5 given indication; however, per discussion with patient, he was told to aim for 2.5 in mid 2019 by his MD due to history of GIB. Per chart review, appears that GIB events are related to procedures (polypectomy) and GI ulcers.   12/17 Received vitamin K PO 1mg  due to INR 5.4.  Note, patient does not eat any vegetables. Did not have bleeding other than small amounts from rectal wounds.  Patient has required smaller warfarin doses this admission than his reported PTA warfarin dose: 5 mg daily.  However, INR has varied widely 1.9 >> 5 and has been difficult to maintain narrow goal of 2.5-3.  INR decreased further today to 1.7.  Received consult to consider bridging with IV heparin.  Patient declines IV heparin because he is a hard stick and being on IV heparin requires more frequent blood work.  No bleeding reported.  Meal intake ranges from 0-100% per charting, which could impact the INRs.  Goal of Therapy:   INR 2.5-3   Plan:  Coumadin 5mg  PO today - consider reducing dose tomorrow if INR responds Daily PT / INR Will not start IV heparin  bridge as patient declined  Salimah Martinovich D. Mina Marble, PharmD, BCPS, [place] 05/02/2019, 2:55 PM

## 2019-05-02 NOTE — Progress Notes (Addendum)
Physical Therapy Session Note  Patient Details  Name: Calvin [name] MRN: [id number] Date of Birth: 1955/07/07  Today's Date: 05/02/2019 PT Individual Time: 0805-0850 PT Individual Time Calculation (min): 45 min   Short Term Goals: Week 4:  PT Short Term Goal 1 (Week 4): Pt will participate in 60 min of upright activity w/ minimal increase in fatigue PT Short Term Goal 2 (Week 4): Pt will maintain upright sitting at EOB w/ supervision x15 min consistently  Skilled Therapeutic Interventions/Progress Updates:   Pt in supine and agreeable to therapy, no c/o pain. Discussed plan for session and possibility of attempting stedy. With some encouragement, pt agreeable to attempt. R/L rolling w/ mod assist to don shorts. Supine>sit w/ mod assist. Sit>stand to stedy w/ max assist +2, able to reach full standing in stedy for the 1st time! Therapist pushing up at bottom and providing verbal/tactile cues to push up on BUEs to reach full upright standing, manual cues for glut activation to reach neutral hip extension. 2nd helper providing pull on sheet behind pt's bottom. Static stance supported in stedy for increased BLE weight bearing x20-30 sec before sitting back down on bed. Supine rest break w/ verbal cues for breathing strategies. Max encouragement to attempt again. Supine>sit w/ mod assist and performed another stand w/ the same assistance as detailed above. Pt initially did not think he could get into full stance a 2nd time, but was able to do so w/o increased physical assist from therapy. Pt very fatigued after 2nd trial, returned to supine and ended session in supine, all needs in reach. Missed 15 min of skilled PT this session, but excellent effort seen from pt.   Therapy Documentation Precautions:  Precautions Precautions: Fall Precaution Comments: monitor HR, L heel wound Restrictions Weight Bearing Restrictions: No Vital Signs: Therapy Vitals Temp: 97.9 F (36.6 C) Temp Source: Oral Pulse  Rate: 88 Resp: 18 BP: (!) 113/44 Patient Position (if appropriate): Lying Oxygen Therapy SpO2: 97 % O2 Device: Room Air Pain: Pain Assessment Pain Scale: 0-10 Pain Score: 0-No pain   Therapy/Group: Individual Therapy  Shanoah Asbill Clent Demark 05/02/2019, 8:53 AM

## 2019-05-02 DEATH — deceased

## 2019-05-03 ENCOUNTER — Inpatient Hospital Stay (HOSPITAL_COMMUNITY): Payer: Medicare Other | Admitting: Occupational Therapy

## 2019-05-03 ENCOUNTER — Inpatient Hospital Stay (HOSPITAL_COMMUNITY): Payer: Medicare Other | Admitting: Physical Therapy

## 2019-05-03 LAB — RENAL FUNCTION PANEL
Albumin: 1.9 g/dL — ABNORMAL LOW (ref 3.5–5.0)
Anion gap: 12 (ref 5–15)
BUN: 35 mg/dL — ABNORMAL HIGH (ref 8–23)
CO2: 25 mmol/L (ref 22–32)
Calcium: 8.9 mg/dL (ref 8.9–10.3)
Chloride: 97 mmol/L — ABNORMAL LOW (ref 98–111)
Creatinine, Ser: 5.72 mg/dL — ABNORMAL HIGH (ref 0.61–1.24)
GFR calc Af Amer: 11 mL/min — ABNORMAL LOW (ref >60)
GFR calc non Af Amer: 10 mL/min — ABNORMAL LOW (ref >60)
Glucose, Bld: 97 mg/dL (ref 70–99)
Phosphorus: 2.8 mg/dL (ref 2.5–4.6)
Potassium: 4.1 mmol/L (ref 3.5–5.1)
Sodium: 134 mmol/L — ABNORMAL LOW (ref 135–145)

## 2019-05-03 LAB — CBC
HCT: 30.2 % — ABNORMAL LOW (ref 39.0–52.0)
Hemoglobin: 9.1 g/dL — ABNORMAL LOW (ref 13.0–17.0)
MCH: 28.3 pg (ref 26.0–34.0)
MCHC: 30.1 g/dL (ref 30.0–36.0)
MCV: 94.1 fL (ref 80.0–100.0)
Platelets: 514 10*3/uL — ABNORMAL HIGH (ref 150–400)
RBC: 3.21 MIL/uL — ABNORMAL LOW (ref 4.22–5.81)
RDW: 18 % — ABNORMAL HIGH (ref 11.5–15.5)
WBC: 13.2 10*3/uL — ABNORMAL HIGH (ref 4.0–10.5)
nRBC: 0 % (ref 0.0–0.2)

## 2019-05-03 LAB — PROTIME-INR
INR: 1.7 — ABNORMAL HIGH (ref 0.8–1.2)
Prothrombin Time: 20 seconds — ABNORMAL HIGH (ref 11.4–15.2)

## 2019-05-03 MED ORDER — WARFARIN SODIUM 4 MG PO TABS
4.0000 mg | ORAL_TABLET | Freq: Once | ORAL | Status: AC
  Administered 2019-05-03: 4 mg via ORAL
  Filled 2019-05-03: qty 1

## 2019-05-03 MED ORDER — HEPARIN SODIUM (PORCINE) 1000 UNIT/ML IJ SOLN
INTRAMUSCULAR | Status: AC
  Administered 2019-05-03: 4700 [IU] via INTRAVENOUS_CENTRAL
  Filled 2019-05-03: qty 5

## 2019-05-03 MED ORDER — HEPARIN SODIUM (PORCINE) 1000 UNIT/ML DIALYSIS
3000.0000 [IU] | INTRAMUSCULAR | Status: DC | PRN
  Filled 2019-05-03 (×2): qty 3

## 2019-05-03 MED ORDER — ALBUMIN HUMAN 25 % IV SOLN
INTRAVENOUS | Status: AC
  Administered 2019-05-03: 14:00:00 25 g
  Filled 2019-05-03: qty 100

## 2019-05-03 MED ORDER — METOCLOPRAMIDE HCL 5 MG PO TABS
10.0000 mg | ORAL_TABLET | Freq: Every day | ORAL | Status: DC
  Administered 2019-05-03 – 2019-05-04 (×2): 10 mg via ORAL
  Filled 2019-05-03 (×2): qty 2

## 2019-05-03 MED ORDER — ALBUMIN HUMAN 5 % IV SOLN
25.0000 g | Freq: Once | INTRAVENOUS | Status: AC
  Filled 2019-05-03: qty 500

## 2019-05-03 MED ORDER — PSYLLIUM 95 % PO PACK
1.0000 | PACK | Freq: Every day | ORAL | Status: DC
  Administered 2019-05-05: 11:00:00 1 via ORAL
  Filled 2019-05-03 (×7): qty 1

## 2019-05-03 MED ORDER — DARBEPOETIN ALFA 200 MCG/0.4ML IJ SOSY
PREFILLED_SYRINGE | INTRAMUSCULAR | Status: AC
  Administered 2019-05-03: 13:00:00 200 ug via INTRAVENOUS
  Filled 2019-05-03: qty 0.4

## 2019-05-03 NOTE — Progress Notes (Signed)
Patient did not receive ferrous gluconate in HD. Patient is refusing to get a peripheral IV. Notified Dr. Jonnie Finner and is aware.

## 2019-05-03 NOTE — Progress Notes (Signed)
Occupational Therapy Session Note  Patient Details  Name: Calvin [name] MRN: [id number] Date of Birth: 1955-09-26  Today's Date: 05/03/2019 OT Individual Time: 1101-1151 OT Individual Time Calculation (min): 50 min   Skilled Therapeutic Interventions/Progress Updates:    Pt greeted in bed, reporting having n/v last night and feeling lousy this AM with bouts of nausea and lightheadedness. Requesting for bedlevel therapy. First guided pt through gentle yoga-based UE stretches as a preparatory activity. Then instructed pt through therex using 6# ball 5 reps 2 sets with min facilitation at Lt elbow. Pt reported bowel incontinence afterwards. +2 assist for hygiene and brief change with pt rolling Rt>Lt. HD transporter then arrived to take pt to HD. 10 minutes missed. Tx focus placed on general strengthening and activity tolerance.   Therapy Documentation Precautions:  Precautions Precautions: Fall Precaution Comments: monitor HR, L heel wound Restrictions Weight Bearing Restrictions: No General OT Amount of Missed Time: 10 Minutes PT Missed Treatment Reason: Patient ill (Comment)(nausea) Vital Signs: Therapy Vitals Pulse Rate: 84 BP: (!) 117/55 Oxygen Therapy SpO2: 98 % Pain: buttocks pain, repositioned pt in partial sidelying before HD transport for pain relief   ADL: ADL Eating: Supervision/safety Where Assessed-Eating: Bed level Grooming: Supervision/safety Where Assessed-Grooming: Bed level Upper Body Bathing: Moderate assistance Where Assessed-Upper Body Bathing: Bed level Lower Body Bathing: Dependent Where Assessed-Lower Body Bathing: Bed level Upper Body Dressing: Maximal assistance Where Assessed-Upper Body Dressing: Edge of bed Lower Body Dressing: Dependent Where Assessed-Lower Body Dressing: Bed level Toileting: Dependent Where Assessed-Toileting: Bed level Toilet Transfer: Unable to assess     Therapy/Group: Individual Therapy  [name] Layni Kreamer 05/03/2019,  12:23 PM

## 2019-05-03 NOTE — Plan of Care (Signed)
  Problem: Consults Goal: RH GENERAL PATIENT EDUCATION Description: See Patient Education module for education specifics. Outcome: Progressing Goal: Skin Care Protocol Initiated - if Braden Score 18 or less Description: If consults are not indicated, leave blank or document N/A Outcome: Progressing   Problem: RH BOWEL ELIMINATION Goal: RH STG MANAGE BOWEL WITH ASSISTANCE Description: STG Manage Bowel with Mod Assistance. Outcome: Progressing   Problem: RH SKIN INTEGRITY Goal: RH STG SKIN FREE OF INFECTION/BREAKDOWN Description: No new breakdown with mod assist  Outcome: Progressing Goal: RH STG ABLE TO PERFORM INCISION/WOUND CARE W/ASSISTANCE Description: STG Able To Perform Incision/Wound Care With Max Assistance. Outcome: Progressing   Problem: RH PAIN MANAGEMENT Goal: RH STG PAIN MANAGED AT OR BELOW PT'S PAIN GOAL Description: < 3 out of 10.  Outcome: Progressing

## 2019-05-03 NOTE — Progress Notes (Addendum)
Notified provider and patient's dialysis RN patient refused his Renvela x2.   13:45 Dr. Birdie Riddle notified pt refusing Renvela.

## 2019-05-03 NOTE — Progress Notes (Addendum)
Subjective:  On Hd , tolerating only 500 cc uf, noted refusing renvela , noted phos lowish  Will dc for now fu trend   Objective Vital signs in last 24 hours: Vitals:   05/03/19 1218 05/03/19 1231 05/03/19 1300 05/03/19 1330  BP: 117/60 114/62 (!) 113/50 (!) 106/55  Pulse: 83 (!) 101 87 (!) 101  Resp: 18     Temp: 98.4 F (36.9 C)     TempSrc: Oral     SpO2: 97%     Weight: 131.6 kg     Height:       Weight change: -1.4 kg  Physical Exam: General: ON Hd  Sleeping  Awoken ,alert NAD Heart: RRR , 1/6 sem ,mech  Click , no rub  Lungs: CTA, non labored  Breathing  Abdomen: BS pos , soft , NT, ND  Extremities: Bipedal edema 1-2+  Dialysis Access: patent on HD  R IJ Perm cath   Problem/Plan: 1.  S/P mechanical aortic valve replacement w/ stent repair of coarctation of aorta: With significant deconditioning and reports to be making slow but gradual progress with inpatient rehabilitation with ongoing management of musculoskeletal pain. 2. ESRD: cont TTS HD schedule. He would like to transition back to peritoneal dialysis at some point as an outpatient, he will continue these discussions with his outpatient nephrologist (Dr. Olivia Mackie). HD tomorrow.  3. Anemia ckd: Hgb 9.1 //  pt says gets very tired w/ Hb < 8. Got 2u prbc on 12/31,  Starting darbe 200ug qSat  given today 1/02 and 1gm nulecit IV Fe load.  4. CKD-MBD: Refusing  sevelamer = ("because I'm  Not eating much " per Pt. ) Lowish  Phos 2.8< 2.5  Will dc and fu   calcium/phosphorus trend while on renal diet. 5. Nutrition: Continue current renal diet with ongoing ONS/protein supplementation. 6. Hypotension/ volume: on midodrine 10 tid and metop 12.5 bid./ was   10kg down from admit wt/ today has  pedal edema with  500 cc uf goal  Will give albumin 25gm on hd attempt to uf 1 liter  With sbp 90 s asymp .   Ernest Haber, PA-C [place] [phone] 05/03/2019,1:52 PM  LOS: 26 days   Pt seen, examined and agree w  A/P as above.  [name]  MD 05/03/2019, 2:33 PM    Labs: Basic Metabolic Panel: Recent Labs  Lab 04/29/19 1431 05/01/19 1409 05/03/19 1244  NA 139 136 134*  K 3.8 4.0 4.1  CL 100 98 97*  CO2 23 23 25   GLUCOSE 113* 106* 97  BUN 51* 39* 35*  CREATININE 7.24* 6.32* 5.72*  CALCIUM 9.3 9.1 8.9  PHOS 3.2 2.5 2.8   Liver Function Tests: Recent Labs  Lab 04/29/19 1431 05/01/19 1409 05/03/19 1244  ALBUMIN 2.1* 2.1* 1.9*   No results for input(s): LIPASE, AMYLASE in the last 168 hours. No results for input(s): AMMONIA in the last 168 hours. CBC: Recent Labs  Lab 04/27/19 1515 04/28/19 0840 04/29/19 1439 05/01/19 0601 05/03/19 1244  WBC 15.2* 13.5* 15.8* 12.4* 13.2*  HGB 8.0* 8.3* 7.7* 7.9* 9.1*  HCT 26.5* 27.8* 25.3* 27.0* 30.2*  MCV 90.4 90.6 92.0 93.4 94.1  PLT 564* 600* 574* 616* 514*   Cardiac Enzymes: No results for input(s): CKTOTAL, CKMB, CKMBINDEX, TROPONINI in the last 168 hours. CBG: No results for input(s): GLUCAP in the last 168 hours.  Studies/Results: No results found. Medications: . ferric gluconate (FERRLECIT/NULECIT) IV Stopped (05/01/19 1721)   .  sodium chloride   Intravenous Once  . ALPRAZolam  0.5 mg Oral TID  . Chlorhexidine Gluconate Cloth  6 each Topical Q0600  . Chlorhexidine Gluconate Cloth  6 each Topical Q0600  . Chlorhexidine Gluconate Cloth  6 each Topical Q0600  . Chlorhexidine Gluconate Cloth  6 each Topical Q0600  . colestipol  4 g Oral TID  . darbepoetin (ARANESP) injection - DIALYSIS  200 mcg Intravenous Q Sat-HD  . metoCLOPramide  10 mg Oral Daily  . metoprolol tartrate  12.5 mg Oral BID  . midodrine  10 mg Oral TID WC  . multivitamin  1 tablet Oral QHS  . nystatin cream   Topical BID  . pantoprazole  40 mg Oral BID  . polycarbophil  625 mg Oral TID WC  . pregabalin  25 mg Oral QHS  . pregabalin  25 mg Oral Q T,Th,Sa-HD  . [START ON 05/04/2019] psyllium  1 packet Oral Daily  . saccharomyces boulardii  500 mg Oral TID   . sertraline  100 mg Oral Daily  . sevelamer carbonate  1,600 mg Oral TID WC  . simethicone  80 mg Oral TID  . warfarin  4 mg Oral ONCE-1800  . Warfarin - Pharmacist Dosing Inpatient   Does not apply q1800  . zinc oxide   Topical QID

## 2019-05-03 NOTE — Progress Notes (Signed)
Physical Therapy Session Note  Patient Details  Name: Calvin [name] MRN: [id number] Date of Birth: Feb 10, 1956  Today's Date: 05/03/2019 PT Individual Time: 0800-0840 PT Individual Time Calculation (min): 40 min  PT Missed Time: 20 min Missed Time Reason: nausea  Short Term Goals: Week 4:  PT Short Term Goal 1 (Week 4): Pt will participate in 60 min of upright activity w/ minimal increase in fatigue PT Short Term Goal 2 (Week 4): Pt will maintain upright sitting at EOB w/ supervision x15 min consistently  Skilled Therapeutic Interventions/Progress Updates:    Pt received supine in bed, agreeable to PT session but does report nausea and emesis last night and wary of movement this AM. Pt declines any anti-nausea medication despite reports of ongoing nausea this AM. Pt also reports pain at gluteal wound site, not rated. Pt reports he has been incontinent of bowel. Rolling L/R with mod to max A with skilled cueing for dependent pericare and brief change. Pt is dependent to don boxers. Encouraged pt to get up to w/c this AM to spend time sitting up out of bed, pt declines due to ongoing nausea and gluteal pain. Pt agreeable to attempt to sit up to EOB. Supine to sit with assist x 2 for BLE management and trunk control, skilled cueing for use of BUE to assist with transfer. Once seated EOB pt reports feeling dizzy and nauseous, requests to return to supine. Sit to supine max A for some trunk control and BLE management. Pt reports another instance of bowel incontinence. Pt is mod to max A for rolling for dependent pericare and brief change. Pt declines any further participation in session due to ongoing nausea. Pt left in semi R sidelying with needs in reach, bed alarm in place. Pt missed 20 min of scheduled therapy session due to nausea.  Therapy Documentation Precautions:  Precautions Precautions: Fall Precaution Comments: monitor HR, L heel wound Restrictions Weight Bearing Restrictions:  No    Therapy/Group: Individual Therapy   Excell Seltzer, PT, DPT  05/03/2019, 8:44 AM

## 2019-05-03 NOTE — Progress Notes (Signed)
[place] PHYSICAL MEDICINE & REHABILITATION PROGRESS NOTE   Subjective/Complaints:   Pt in bed- lying supine and talking to wife on speaker phone- asking for assistance/new eye to deal with buttock ulcers and digestion and gastroparesis- has been on Reglan before but thought it make bowel issues worse- discussed at length- we decided to try and do 1x/day vs 3x/day and pt admits has been refusing metamucil- makes him feel bloated- I think more likely worse due to gastroparesis. Will restart metamucil at lunch tomorrow after 1x/day Reglan dose.   ROS: Patient denies fever, rash, sore throat, blurred vision, nausea, vomiting, diarrhea, cough,  chest pain, joint or back pain, headache, or mood change.       Objective:   No results found. Recent Labs    05/01/19 0601  WBC 12.4*  HGB 7.9*  HCT 27.0*  PLT 616*   Recent Labs    05/01/19 1409  NA 136  K 4.0  CL 98  CO2 23  GLUCOSE 106*  BUN 39*  CREATININE 6.32*  CALCIUM 9.1    Intake/Output Summary (Last 24 hours) at 05/03/2019 1154 Last data filed at 05/03/2019 0809 Gross per 24 hour  Intake 600 ml  Output --  Net 600 ml     Physical Exam: Vital Signs Blood pressure (!) 117/55, pulse 84, temperature 98 F (36.7 C), resp. rate 18, height 6\' 2"  (1.88 m), weight 130 kg, SpO2 98 %. Constitutional: out of breath . Vital signs reviewed. HEENT: EOMI, oral membranes dry Neck: supple Cardiovascular: RRR without murmur. No JVD    Respiratory: CTA Bilaterally without wheezes or rales. Normal effort    GI: BS +, non-tender, non-distended Constitutional: No distress . Vital signs reviewed. HEENT: EOMI, oral membranes moist Neck: supple Cardiovascular: RRR without murmur. No JVD    Respiratory: CTA Bilaterally without wheezes or rales. Less shallow breathing GI: BS +, non-tender, non-distended  Skin: Warm and dry.  Heel blisters/eschar are stable, left more involved than right still. Excoriated/macerated region around  buttock improved with only 1-2 small openings -not visualized today Psych: slightly anxious, slightly flat Musc: Lower extremity edema decreased, trace Neurological: Alert Motor: Bilateral upper extremities: 4/5 proximal distal Right lower extremity: 3 to 3+/5 proximal distal Left lower extremity: 3 to 3+/5--stable  Assessment/Plan: 1. Functional deficits secondary to debility which require 3+ hours per day of interdisciplinary therapy in a comprehensive inpatient rehab setting.  Physiatrist is providing close team supervision and 24 hour management of active medical problems listed below.  Physiatrist and rehab team continue to assess barriers to discharge/monitor patient progress toward functional and medical goals  Care Tool:  Bathing  Bathing activity did not occur: Refused Body parts bathed by patient: Front perineal area, Buttocks         Bathing assist Assist Level: 2 Helpers     Upper Body Dressing/Undressing Upper body dressing   What is the patient wearing?: Pull over shirt    Upper body assist Assist Level: Supervision/Verbal cueing    Lower Body Dressing/Undressing Lower body dressing      What is the patient wearing?: Pants, Incontinence brief     Lower body assist Assist for lower body dressing: Maximal Assistance - Patient 25 - 49%     Toileting Toileting    Toileting assist Assist for toileting: 2 Helpers     Transfers Chair/bed transfer  Transfers assist  Chair/bed transfer activity did not occur: Safety/medical concerns  Chair/bed transfer assist level: 2 Helpers     Locomotion  Ambulation   Ambulation assist   Ambulation activity did not occur: Safety/medical concerns          Walk 10 feet activity   Assist  Walk 10 feet activity did not occur: Safety/medical concerns        Walk 50 feet activity   Assist Walk 50 feet with 2 turns activity did not occur: Safety/medical concerns         Walk 150 feet  activity   Assist Walk 150 feet activity did not occur: Safety/medical concerns         Walk 10 feet on uneven surface  activity   Assist Walk 10 feet on uneven surfaces activity did not occur: Safety/medical concerns         Wheelchair     Assist Will patient use wheelchair at discharge?: Yes Type of Wheelchair: Manual Wheelchair activity did not occur: Safety/medical concerns  Wheelchair assist level: Minimal Assistance - Patient > 75% Max wheelchair distance: 5'    Wheelchair 50 feet with 2 turns activity    Assist    Wheelchair 50 feet with 2 turns activity did not occur: Safety/medical concerns       Wheelchair 150 feet activity     Assist  Wheelchair 150 feet activity did not occur: Safety/medical concerns       Blood pressure (!) 117/55, pulse 84, temperature 98 F (36.7 C), resp. rate 18, height 6\' 2"  (1.88 m), weight 130 kg, SpO2 98 %.  Medical Problem List and Plan: 1.  Impaired mobility and ADLs secondary to debility   Continue CIR   1/1 -slow progress, working on standing/transfers. Was able to stand for first time todaay!  -working on placement 2.  St Jude's Aortic Valve/Antithrombotics:  -DVT/anticoagulation:  Pharmaceutical: Coumadin  12/31:  INR 2.1, given 4mg  coumadin  1/1 INR down to 1.6. dosing adjustment today per pharmacy. May need to consider heparin coverage.   1/2- INR 1.7- pt refusing Heparin gtt since more blood draws.             -antiplatelet therapy: N/A 3. Pain Management: Oxycodone prn  -resumed lyrica at hs only 25mg , added additional 25 mg postdialysis 4. Mood: LCSW to follow for evaluation and support.              -antipsychotic agents: N/A  -anxiety disorder/ recent MDD    continue xanax 0.5mg  TID  - improvement in general  -Zoloft increased to home dose of 100 mg on 12/20 5. Neuropsych: This patient is capable of making decisions on his own behalf. 6. Skin/Wound Care: Air mattress overlay for MASD due to  loose stool.    -gerhardt's cream to buttocks  -rx loose stools  -prevalon boots bilateral heels  -slowly improving 7. Fluids/Electrolytes/Nutrition: Strict I/O. Renal diet with 1200 cc FR.  8.   ESRD: Now on HD. Schedule hemodialysis at the end of the day to help with therapy tolerance. TTS schedule   12/10: diet liberalized to help with intake  -volume/edema mgt per nephrology   [facility] Weights   05/01/19 1730 05/02/19 0630 05/03/19 0534  Weight: 129.9 kg 131.1 kg 130 kg    Stable on 1/1 9. Hypotension: Monitor BP tid--continue midodrine.    10 History of A fib/A flutter: Monitor HR -on coumadin. Continue Lopressor 12.5mg  BID. 11. Recent GIB/Anemia of chronic disease: aranesp,.       hgb/tranfusion per nephrology-received 2u prbc 12/31 12. Loose stool: C diff negative  -continue fiber  -increased probiotic to 500mg   TID  -diet--reinforced that consistent PO intake is important also  -12/18 stopped megace given it's lack of benefit and potential for loose stool (although was having loose stool long before this was started)  Appreciate GI recs, medication initiated, await further recs  12/24: Diarrhea resolved. Will change Lomotil back to prn. Continue metamucil which has helped stool be more formed. Appreciate pharmacy's input.   12/27: Increased colestipol frequency upon discussion with patient and wife.  12/30: bloating better off metamucil   -continue fibercon and colestipol  12/31: resumed metamucil once daily only as stool more mushy again  1/1: continue with current plan  1/2- pt's wife reported gastroparesis which might respond slightly to Reglan? Will try Reglan 1x/day instead of TID- at 11am and pt will try metamucil tomorrow.  13. Cough with phlegm: Guaifenesin prn 14. Shortness of breath at rest: encourage IS, anxiety mgt. Improving 15. Leukocytosis:  -wbc's 13.4 on 12/19, 14.3 on 12/23, 15.8 12/29--> 12.4 12/31  Afebrile  -bc negative no growth 16.  Morbid obesity:  Encouraged appropriate, regular intake. He's afraid to eat big meals    LOS: 26 days A FACE TO FACE EVALUATION WAS PERFORMED  Dacie Mandel 05/03/2019, 11:54 AM

## 2019-05-03 NOTE — Progress Notes (Signed)
ANTICOAGULATION CONSULT NOTE - Follow Up Consult  Pharmacy Consult for Warfarin Indication: atrial fibrillation and St. Jude AVR (0383F)  Patient Measurements: Height: 6\' 2"  (188 cm) Weight: 286 lb 9.6 oz (130 kg) IBW/kg (Calculated) : 82.2  Vital Signs: Temp: 98 F (36.7 C) (01/02 0534) BP: 109/52 (01/02 0744) Pulse Rate: 93 (01/02 0744)  Labs: Recent Labs    05/01/19 0601 05/01/19 1409 05/02/19 0510 05/03/19 0348  HGB 7.9*  --   --   --   HCT 27.0*  --   --   --   PLT 616*  --   --   --   LABPROT 23.7*  --  20.2* 20.0*  INR 2.1*  --  1.7* 1.7*  CREATININE  --  6.32*  --   --     Estimated Creatinine Clearance: 17.4 mL/min (A) (by C-G formula based on SCr of 6.32 mg/dL (H)).   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 Goal INR range should be 2.5-3.5 given indication; however, per discussion with patient, he was told to aim for 2.5 in mid 2019 by his MD due to history of GIB. Per chart review, appears that GIB events are related to procedures (polypectomy) and GI ulcers.   12/17 Received vitamin K PO 1mg  due to INR 5.4.  Note, patient does not eat any vegetables. Did not have bleeding other than small amounts from rectal wounds.  Patient has required smaller warfarin doses this admission than his reported PTA warfarin dose: 5 mg daily.  However, INR has varied widely 1.9 >> 5 and has been difficult to maintain narrow goal of 2.5-3.  INR 1.7 today, Received consult to consider bridging with IV heparin.  Patient declines IV heparin because he is a hard stick and being on IV heparin requires more frequent blood work.  No bleeding reported.  Meal intake ranges from 0-100% per charting, which could impact the INRs.  Goal of Therapy:   INR 2.5-3   Plan:  Coumadin 4mg  PO today Daily PT / INR Will not start IV heparin bridge as patient declined, INR will likely trending up tomorrow  Maryanna Shape, PharmD, BCPS, BCPPS Clinical  Pharmacist  Pager: [phone]   05/03/2019, 7:47 AM

## 2019-05-04 ENCOUNTER — Inpatient Hospital Stay (HOSPITAL_COMMUNITY): Payer: Medicare Other | Admitting: Physical Therapy

## 2019-05-04 ENCOUNTER — Inpatient Hospital Stay (HOSPITAL_COMMUNITY): Payer: Medicare Other

## 2019-05-04 LAB — PROTIME-INR
INR: 1.8 — ABNORMAL HIGH (ref 0.8–1.2)
Prothrombin Time: 20.7 seconds — ABNORMAL HIGH (ref 11.4–15.2)

## 2019-05-04 LAB — CBC
HCT: 29.1 % — ABNORMAL LOW (ref 39.0–52.0)
Hemoglobin: 8.8 g/dL — ABNORMAL LOW (ref 13.0–17.0)
MCH: 27.7 pg (ref 26.0–34.0)
MCHC: 30.2 g/dL (ref 30.0–36.0)
MCV: 91.5 fL (ref 80.0–100.0)
Platelets: 421 10*3/uL — ABNORMAL HIGH (ref 150–400)
RBC: 3.18 MIL/uL — ABNORMAL LOW (ref 4.22–5.81)
RDW: 18 % — ABNORMAL HIGH (ref 11.5–15.5)
WBC: 12.3 10*3/uL — ABNORMAL HIGH (ref 4.0–10.5)
nRBC: 0 % (ref 0.0–0.2)

## 2019-05-04 MED ORDER — METOCLOPRAMIDE HCL 5 MG PO TABS: 10.0000 mg | ORAL_TABLET | Freq: Once | ORAL | Status: AC

## 2019-05-04 MED ORDER — METOCLOPRAMIDE HCL 5 MG PO TABS
10.0000 mg | ORAL_TABLET | Freq: Every day | ORAL | Status: DC
  Filled 2019-05-04: qty 2

## 2019-05-04 MED ORDER — WARFARIN SODIUM 5 MG PO TABS: 5.0000 mg | ORAL_TABLET | ORAL | Status: DC

## 2019-05-04 MED ORDER — WARFARIN SODIUM 5 MG PO TABS
5.0000 mg | ORAL_TABLET | Freq: Once | ORAL | Status: AC
  Administered 2019-05-04: 5 mg via ORAL
  Filled 2019-05-04: qty 1

## 2019-05-04 MED ORDER — METOCLOPRAMIDE HCL 5 MG PO TABS
10.0000 mg | ORAL_TABLET | Freq: Every day | ORAL | Status: DC
  Administered 2019-05-04 – 2019-05-05 (×2): 10 mg via ORAL
  Filled 2019-05-04: qty 2

## 2019-05-04 MED ORDER — ALBUTEROL SULFATE (2.5 MG/3ML) 0.083% IN NEBU
3.0000 mL | INHALATION_SOLUTION | Freq: Four times a day (QID) | RESPIRATORY_TRACT | Status: DC | PRN
  Administered 2019-05-04: 3 mL via RESPIRATORY_TRACT
  Filled 2019-05-04: qty 3

## 2019-05-04 MED ORDER — GUAIFENESIN ER 600 MG PO TB12
600.0000 mg | ORAL_TABLET | Freq: Two times a day (BID) | ORAL | Status: AC
  Administered 2019-05-04 – 2019-05-05 (×4): 600 mg via ORAL
  Filled 2019-05-04 (×4): qty 1

## 2019-05-04 NOTE — Progress Notes (Signed)
[place] PHYSICAL MEDICINE & REHABILITATION PROGRESS NOTE   Subjective/Complaints:  Pt reports he asked for Reglan this AM- didn't understand that was for lunch time- wants it at lunch time on Non HD days and at 5pm on HD days.   Was nauseated this AM, but better after Reglan given (didn't respond to Compazine)   ROS: Patient denies fever, rash, sore throat, blurred vision, nausea, vomiting, diarrhea, cough,  chest pain, joint or back pain, headache, or mood change.       Objective:   No results found. Recent Labs    05/03/19 1244 05/04/19 0623  WBC 13.2* 12.3*  HGB 9.1* 8.8*  HCT 30.2* 29.1*  PLT 514* 421*   Recent Labs    05/01/19 1409 05/03/19 1244  NA 136 134*  K 4.0 4.1  CL 98 97*  CO2 23 25  GLUCOSE 106* 97  BUN 39* 35*  CREATININE 6.32* 5.72*  CALCIUM 9.1 8.9    Intake/Output Summary (Last 24 hours) at 05/04/2019 1104 Last data filed at 05/04/2019 0730 Gross per 24 hour  Intake 360 ml  Output 500 ml  Net -140 ml     Physical Exam: Vital Signs Blood pressure (!) 106/52, pulse 98, temperature 97.8 F (36.6 C), temperature source Oral, resp. rate 18, height 6\' 2"  (1.88 m), weight 132 kg, SpO2 99 %. Constitutional: NAD . Vital signs reviewed. Lying in bed;  HEENT: EOMI, oral membranes dry Neck: supple Cardiovascular: RRR without murmur. No JVD    Respiratory: CTA Bilaterally without wheezes or rales. Normal effort    GI: BS +, slightly TTP, non-distended Constitutional: No distress . HEENT: EOMI, oral membranes moist Neck: supple Cardiovascular: RRR without murmur. No JVD    Respiratory: CTA Bilaterally without wheezes or rales. Less shallow breathing GI: BS +, non-tender, non-distended  Skin: Warm and dry.  Heel blisters/eschar are stable, left more involved than right still. Excoriated/macerated region around buttock improved with only 1-2 small openings -not visualized today Psych: slightly anxious, slightly flat Musc: Lower extremity edema  decreased, trace Neurological: Alert Motor: Bilateral upper extremities: 4/5 proximal distal Right lower extremity: 3 to 3+/5 proximal distal Left lower extremity: 3 to 3+/5--stable  Assessment/Plan: 1. Functional deficits secondary to debility which require 3+ hours per day of interdisciplinary therapy in a comprehensive inpatient rehab setting.  Physiatrist is providing close team supervision and 24 hour management of active medical problems listed below.  Physiatrist and rehab team continue to assess barriers to discharge/monitor patient progress toward functional and medical goals  Care Tool:  Bathing  Bathing activity did not occur: Refused Body parts bathed by patient: Front perineal area, Buttocks         Bathing assist Assist Level: 2 Helpers     Upper Body Dressing/Undressing Upper body dressing   What is the patient wearing?: Pull over shirt    Upper body assist Assist Level: Supervision/Verbal cueing    Lower Body Dressing/Undressing Lower body dressing      What is the patient wearing?: Pants, Incontinence brief     Lower body assist Assist for lower body dressing: Maximal Assistance - Patient 25 - 49%     Toileting Toileting    Toileting assist Assist for toileting: 2 Helpers     Transfers Chair/bed transfer  Transfers assist  Chair/bed transfer activity did not occur: Safety/medical concerns  Chair/bed transfer assist level: 2 Helpers     Locomotion Ambulation   Ambulation assist   Ambulation activity did not occur: Safety/medical concerns  Walk 10 feet activity   Assist  Walk 10 feet activity did not occur: Safety/medical concerns        Walk 50 feet activity   Assist Walk 50 feet with 2 turns activity did not occur: Safety/medical concerns         Walk 150 feet activity   Assist Walk 150 feet activity did not occur: Safety/medical concerns         Walk 10 feet on uneven surface   activity   Assist Walk 10 feet on uneven surfaces activity did not occur: Safety/medical concerns         Wheelchair     Assist Will patient use wheelchair at discharge?: Yes Type of Wheelchair: Manual Wheelchair activity did not occur: Safety/medical concerns  Wheelchair assist level: Minimal Assistance - Patient > 75% Max wheelchair distance: 5'    Wheelchair 50 feet with 2 turns activity    Assist    Wheelchair 50 feet with 2 turns activity did not occur: Safety/medical concerns       Wheelchair 150 feet activity     Assist  Wheelchair 150 feet activity did not occur: Safety/medical concerns       Blood pressure (!) 106/52, pulse 98, temperature 97.8 F (36.6 C), temperature source Oral, resp. rate 18, height 6\' 2"  (1.88 m), weight 132 kg, SpO2 99 %.  Medical Problem List and Plan: 1.  Impaired mobility and ADLs secondary to debility   Continue CIR   1/1 -slow progress, working on standing/transfers. Was able to stand for first time todaay!  -working on placement 2.  St Jude's Aortic Valve/Antithrombotics:  -DVT/anticoagulation:  Pharmaceutical: Coumadin  12/31:  INR 2.1, given 4mg  coumadin  1/1 INR down to 1.6. dosing adjustment today per pharmacy. May need to consider heparin coverage.   1/2- INR 1.7- pt refusing Heparin gtt since more blood draws.   1/3- INR 1.8            -antiplatelet therapy: N/A 3. Pain Management: Oxycodone prn  -resumed lyrica at hs only 25mg , added additional 25 mg postdialysis 4. Mood: LCSW to follow for evaluation and support.              -antipsychotic agents: N/A  -anxiety disorder/ recent MDD    continue xanax 0.5mg  TID  - improvement in general  -Zoloft increased to home dose of 100 mg on 12/20 5. Neuropsych: This patient is capable of making decisions on his own behalf. 6. Skin/Wound Care: Air mattress overlay for MASD due to loose stool.    -gerhardt's cream to buttocks  -rx loose stools  -prevalon boots  bilateral heels  -slowly improving 7. Fluids/Electrolytes/Nutrition: Strict I/O. Renal diet with 1200 cc FR.  8.   ESRD: Now on HD. Schedule hemodialysis at the end of the day to help with therapy tolerance. TTS schedule   12/10: diet liberalized to help with intake  -volume/edema mgt per nephrology   [facility] Weights   05/03/19 1218 05/03/19 1551 05/04/19 0427  Weight: 131.6 kg 130.5 kg 132 kg    Stable on 1/3 9. Hypotension: Monitor BP tid--continue midodrine.    10 History of A fib/A flutter: Monitor HR -on coumadin. Continue Lopressor 12.5mg  BID. 11. Recent GIB/Anemia of chronic disease: aranesp,.       hgb/tranfusion per nephrology-received 2u prbc 12/31 12. Loose stool: C diff negative  -continue fiber  -increased probiotic to 500mg   TID  -diet--reinforced that consistent PO intake is important also  -12/18 stopped megace given  it's lack of benefit and potential for loose stool (although was having loose stool long before this was started)  Appreciate GI recs, medication initiated, await further recs  12/24: Diarrhea resolved. Will change Lomotil back to prn. Continue metamucil which has helped stool be more formed. Appreciate pharmacy's input.   12/27: Increased colestipol frequency upon discussion with patient and wife.  12/30: bloating better off metamucil   -continue fibercon and colestipol  12/31: resumed metamucil once daily only as stool more mushy again  1/1: continue with current plan  1/2- pt's wife reported gastroparesis which might respond slightly to Reglan? Will try Reglan 1x/day instead of TID- at 11am and pt will try metamucil tomorrow.   1/3- Changed to lunch time Reglan with Non HD days and 5pm on HD days.  13. Cough with phlegm: Guaifenesin prn 14. Shortness of breath at rest: encourage IS, anxiety mgt. Improving 15. Leukocytosis:  -wbc's 13.4 on 12/19, 14.3 on 12/23, 15.8 12/29--> 12.4 12/31  Afebrile  -bc negative no growth  -WBC 12.3 on 1/3 16.  Morbid  obesity: Encouraged appropriate, regular intake. He's afraid to eat big meals    LOS: 27 days A FACE TO FACE EVALUATION WAS PERFORMED  Kapono Luhn 05/04/2019, 11:04 AM

## 2019-05-04 NOTE — Progress Notes (Addendum)
Patient c/o weak cough with small, thin, clear sputum. Bilateral coarse crackles heard and relayed to provider. See new orders. Respiratory therapist was called and request to see pt was acknowledged.

## 2019-05-04 NOTE — Progress Notes (Signed)
Occupational Therapy Session Note  Patient Details  Name: Calvin [name] MRN: [id number] Date of Birth: 02/27/1956  Today's Date: 05/04/2019 OT Individual Time: 0900-0945 OT Individual Time Calculation (min): 45 min  and Today's Date: 05/04/2019 OT Missed Time: 15 Minutes Missed Time Reason: Patient ill (comment)(coughing)   Short Term Goals: Week 1:  OT Short Term Goal 1 (Week 1): Pt will don UB clothing EOB with mod A OT Short Term Goal 1 - Progress (Week 1): Met OT Short Term Goal 2 (Week 1): Pt will transfer to EOB with mod A OT Short Term Goal 2 - Progress (Week 1): Not met OT Short Term Goal 3 (Week 1): Pt will reduce anxiety during OT, evidenced by maintaining HR under 130 bpm during bed mobility OT Short Term Goal 3 - Progress (Week 1): Met  Skilled Therapeutic Interventions/Progress Updates:    1;1. Pt initially requesting OT to return at later time d/t coughing and nausea (missed 15 min d/t schedule rearrange). Upon return pt agreeable to OT. Pt with audible crackling and phlem build up in chest. RN alerted as this was different from last week. Pt able to cough up clear mucous, however d/t weak abdominals requires increased time. Reinforced education on inspirometer/expirometer use to strengthen intercostal musculature. Pt reporting having had BM. Pt requires encouragement and MIN A to roll/hook LE over bed rail for OT to perform dependent peri care. Pt very adamant about needing more A to roll than necessary. Pt dons boxers by assisting lifting LEs off bed. Pt agreeable to attempt to stand in stedy.  Supine>sitting with MOD A and use of bed rails with VC for trunk management/pushing up with UEs. Pt coughs upon sitting and reports being dizzy and requests to lie down. Exited session with pt seated in bed, exit alarm on and call light tin reach.   Therapy Documentation Precautions:  Precautions Precautions: Fall Precaution Comments: monitor HR, L heel wound Restrictions Weight  Bearing Restrictions: No General: General OT Amount of Missed Time: 15 Minutes Vital Signs: Therapy Vitals Pulse Rate: 92 BP: (!) 128/55 Oxygen Therapy SpO2: 95 % Pain:   ADL: ADL Eating: Supervision/safety Where Assessed-Eating: Bed level Grooming: Supervision/safety Where Assessed-Grooming: Bed level Upper Body Bathing: Moderate assistance Where Assessed-Upper Body Bathing: Bed level Lower Body Bathing: Dependent Where Assessed-Lower Body Bathing: Bed level Upper Body Dressing: Maximal assistance Where Assessed-Upper Body Dressing: Edge of bed Lower Body Dressing: Dependent Where Assessed-Lower Body Dressing: Bed level Toileting: Dependent Where Assessed-Toileting: Bed level Toilet Transfer: Unable to assess Vision   Perception    Praxis   Exercises:   Other Treatments:     Therapy/Group: Individual Therapy  Stephanie M Schlosser 05/04/2019, 12:19 PM 

## 2019-05-04 NOTE — Progress Notes (Addendum)
Patient returned from radiology and relayed results to the provider. Patient is sleeping with oxygen on for comfort by his request, with wife at bedside. Notified provider pt did not take his 1200 or 1400 medications except his Xanax. Provider ordered breathing treatment PRN. RN to update provider with any changes.

## 2019-05-04 NOTE — Progress Notes (Signed)
Physical Therapy Session Note  Patient Details  Name: Calvin [name] MRN: [id number] Date of Birth: 1955/05/17  Today's Date: 05/04/2019 PT Individual Time: 1100-1150 PT Individual Time Calculation (min): 50 min   Short Term Goals: Week 4:  PT Short Term Goal 1 (Week 4): Pt will participate in 60 min of upright activity w/ minimal increase in fatigue PT Short Term Goal 2 (Week 4): Pt will maintain upright sitting at EOB w/ supervision x15 min consistently  Skilled Therapeutic Interventions/Progress Updates: Pt presented in bed requiring extensive encouragement to participate in therapy. Explained to pt that this PTA is aware that he has a daily goal of performing OOB activity. Pt agreeable to attempt sitting EOB as pt has had bouts of coughing/nausea all am. Pt then states he had BM and requires changing. Pt was able to R to R with minA allowing PTA to remove brief but then pt becoming anxious and requesting to sit up prior to PTA completing peri-care. Pt's head was raised and pt spent several minutes attempting to cough up phlegm with minimal success. PTA encouraged use of IS which pt was able to perform x 10 but only at approx 344m with x1 bout of 5055mwith PTA providing cues for max inhalation. Pt then refusing any additional peri-care despite PTA offering total A and voiced concern regarding known skin breakdown. Nsg notified and advised pt will return in 10 min with SuManuela Schwartzpt's nurse and NT notified. PTA returned 10 min later with nsg present and pt agreeable to complete peri-care. Performed rolling L/R with minA while NT completed peri-care. Once completed pt repositioned to comfort and left with call bell within reach and needs met.       Therapy Documentation Precautions:  Precautions Precautions: Fall Precaution Comments: monitor HR, L heel wound Restrictions Weight Bearing Restrictions: No General: PT Amount of Missed Time (min): 10 Minutes PT Missed Treatment Reason:  Toileting;Patient fatigue;Patient ill (Comment) Vital Signs: Therapy Vitals Temp: 98.1 F (36.7 C) Temp Source: Oral Pulse Rate: 89 Resp: 20 BP: (!) 111/94 Patient Position (if appropriate): Lying Oxygen Therapy SpO2: 100 % O2 Device: Room Air Pain:   Mobility:   Locomotion :    Trunk/Postural Assessment :    Balance:   Exercises:   Other Treatments:      Therapy/Group: Individual Therapy  Darnetta Kesselman 05/04/2019, 4:17 PM

## 2019-05-04 NOTE — Progress Notes (Signed)
Around 8pm patient complained of SOB and requested for oxygen. Vital signs checked and lung sounds were rhonchus and with wheezing. Offered incentive spirometer and was able to reach up to 250. Notified RT and requested for breathing treatment. RT administered breathing treatment and breath sounds still the same. RT recommended second opinion from [facility] and got in touch with Shanon Brow.

## 2019-05-04 NOTE — Progress Notes (Signed)
ANTICOAGULATION CONSULT NOTE - Follow Up Consult  Pharmacy Consult for Warfarin Indication: atrial fibrillation and St. Jude AVR (2563S)  Patient Measurements: Height: 6\' 2"  (188 cm) Weight: 290 lb 14.4 oz (132 kg) IBW/kg (Calculated) : 82.2  Vital Signs: Temp: 97.8 F (36.6 C) (01/03 0427) Temp Source: Oral (01/03 0427) BP: 107/43 (01/03 0427) Pulse Rate: 88 (01/03 0427)  Labs: Recent Labs    05/01/19 1409 05/02/19 0510 05/03/19 0348 05/03/19 1244 05/04/19 0623  HGB  --   --   --  9.1* 8.8*  HCT  --   --   --  30.2* 29.1*  PLT  --   --   --  514* 421*  LABPROT  --  20.2* 20.0*  --  20.7*  INR  --  1.7* 1.7*  --  1.8*  CREATININE 6.32*  --   --  5.72*  --     Estimated Creatinine Clearance: 19.3 mL/min (A) (by C-G formula based on SCr of 5.72 mg/dL (H)).   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 Goal INR range should be 2.5-3.5 given indication; however, per discussion with patient, he was told to aim for 2.5 in mid 2019 by his MD due to history of GIB. Per chart review, appears that GIB events are related to procedures (polypectomy) and GI ulcers.   12/17 Received vitamin K PO 1mg  due to INR 5.4.  Note, patient does not eat any vegetables. Did not have bleeding other than small amounts from rectal wounds.  Patient has required smaller warfarin doses this admission than his reported PTA warfarin dose: 5 mg daily.  However, INR has varied widely 1.9 >> 5 and has been difficult to maintain narrow goal of 2.5-3.  INR 1.8 today, Received consult to consider bridging with IV heparin.  Patient declines IV heparin because he is a hard stick and being on IV heparin requires more frequent blood work.  No bleeding reported.  Meal intake ranges from 0-100% per charting, which could impact the INRs.  Goal of Therapy:   INR 2.5-3   Plan:  Warfarin 5mg  PO today Daily PT / INR Will not start IV heparin bridge as patient  declined  Georga Bora, PharmD Clinical Pharmacist 05/04/2019 7:42 AM Please check AMION for all [place] numbers    05/04/2019, 7:36 AM

## 2019-05-04 NOTE — Plan of Care (Signed)
  Problem: Consults Goal: RH GENERAL PATIENT EDUCATION Description: See Patient Education module for education specifics. Outcome: Progressing Goal: Skin Care Protocol Initiated - if Braden Score 18 or less Description: If consults are not indicated, leave blank or document N/A Outcome: Progressing   Problem: RH BOWEL ELIMINATION Goal: RH STG MANAGE BOWEL WITH ASSISTANCE Description: STG Manage Bowel with Mod Assistance. Outcome: Progressing   Problem: RH SKIN INTEGRITY Goal: RH STG SKIN FREE OF INFECTION/BREAKDOWN Description: No new breakdown with mod assist  Outcome: Progressing Goal: RH STG ABLE TO PERFORM INCISION/WOUND CARE W/ASSISTANCE Description: STG Able To Perform Incision/Wound Care With Max Assistance. Outcome: Progressing   Problem: RH PAIN MANAGEMENT Goal: RH STG PAIN MANAGED AT OR BELOW PT'S PAIN GOAL Description: < 3 out of 10.  Outcome: Progressing

## 2019-05-04 NOTE — Progress Notes (Signed)
Pt seen an assessed at the RN's request.  According to the RN, the pt choked on some bacon.  At this time, pt is resting comfortably and not in distress.  Bilateral breath sounds are congested with scattered rhonchi.  Pt coached in some deep breathing and coughing with some clearance of secretions noted.  Pt encouraged to continue using IS and coughing frequently.  Family at bedside demonstrates understanding of all of the above and her questions were answered to her satisfaction.  CXR is being obtained this afternoon.

## 2019-05-05 ENCOUNTER — Inpatient Hospital Stay (HOSPITAL_COMMUNITY): Payer: Medicare Other

## 2019-05-05 LAB — CBC
HCT: 29.2 % — ABNORMAL LOW (ref 39.0–52.0)
Hemoglobin: 8.7 g/dL — ABNORMAL LOW (ref 13.0–17.0)
MCH: 27.9 pg (ref 26.0–34.0)
MCHC: 29.8 g/dL — ABNORMAL LOW (ref 30.0–36.0)
MCV: 93.6 fL (ref 80.0–100.0)
Platelets: 452 10*3/uL — ABNORMAL HIGH (ref 150–400)
RBC: 3.12 MIL/uL — ABNORMAL LOW (ref 4.22–5.81)
RDW: 18.2 % — ABNORMAL HIGH (ref 11.5–15.5)
WBC: 13.9 10*3/uL — ABNORMAL HIGH (ref 4.0–10.5)
nRBC: 0 % (ref 0.0–0.2)

## 2019-05-05 LAB — PROTIME-INR
INR: 2 — ABNORMAL HIGH (ref 0.8–1.2)
Prothrombin Time: 22.3 seconds — ABNORMAL HIGH (ref 11.4–15.2)

## 2019-05-05 MED ORDER — WARFARIN SODIUM 5 MG PO TABS
5.0000 mg | ORAL_TABLET | Freq: Once | ORAL | Status: AC
  Administered 2019-05-05: 18:00:00 5 mg via ORAL
  Filled 2019-05-05: qty 1

## 2019-05-05 NOTE — Progress Notes (Signed)
Pt assessed and lung sounds rhonchus bilaterally. Patient denies any discomfort or shortness of breath. Vital signs checked and MEWS was 2 due to respiration of 32. Patient offered incentive spirometer and was able to reach 250 x 6 times; encouraged deep breathing and coughing. Rapid Response RN David at bedside and checked patient. He assisted patient on use of incentive spirometer. Recommended use of flutter valve. RT at bedside and assisted pt with flutter valve. Pt resting in bed and denies any need at this time.

## 2019-05-05 NOTE — Progress Notes (Signed)
Renal Navigator spoke with Social Worker/Sloane at [facility], who requested an update on patient. Navigator discussed CSW/L. Hoyle's note from 04/30/19 with OP HD Social Worker. Social Worker requests to know as soon as possible if his seat needs to be cancelled, but it will continue to be held at this time. Renal Navigator agrees.  Alphonzo Cruise, [place] Renal Navigator [phone]

## 2019-05-05 NOTE — Significant Event (Signed)
Rapid Response Event Note  Overview: Hypoxic event  Initial Focused Assessment: I was asked to see Mr. Orsak regarding his Rhonchi BS and weak cough. Nursing staff called me because his oxygen saturations dropped to 85% on 2L. Upon arrival, Mr. Ledet endorsed SOB and some tachypnea (32) while on [place] at 4L. Audible rhonchi and weak cough. NTS x 2 passes with moderate amount of thick white secretions. Pt tolerated well. BBS clear anteriorly with diminished bases. RR 24 and sats 97% on 4LNC.   Interventions: -NTS x 2 passes  Plan of Care (if not transferred): -Continue to monitor respiratory status -notify primary svc of event  -notify primary svc and/or RRRN for further assistance.   Event Summary: Call received [phone] Arrived at call 0206 Call ended Calvin [name]  Madelynn Done

## 2019-05-05 NOTE — Progress Notes (Signed)
Pts breathing still noisy and lung sounds rhonchus. Vital signs done and 02 sat was 85 on 2L. Pt having difficulty talking and catching breath. 02 increased to 4L and rapid response called to bedside.  Rapid response David performed deep suctioning per nasal and was able to suction moderate amount of white colored secretions/mucous. Noisy breathing was no longer audible and pt stated he felt better. Lung sounds less rhonchus. Oxygen flow regulated back to 2L. Dr. Dagoberto Ligas informed.

## 2019-05-05 NOTE — Progress Notes (Signed)
Subjective:  Seen at bedside, wife present.  Overall denies feeling orthopnea, edema.  Say HD has been going fairly well  Objective Vital signs in last 24 hours: Vitals:   05/05/19 0200 05/05/19 0222 05/05/19 0626 05/05/19 0630  BP: 112/68 112/68 (!) 108/49   Pulse: (!) 54 60 88   Resp: (!) 34 (!) 24 18   Temp:  97.9 F (36.6 C) 97.9 F (36.6 C)   TempSrc:  Oral Oral   SpO2: 97% 99% 100%   Weight:    131.9 kg  Height:       Weight change: 0.3 kg  Physical Exam: General: in chair beside bed,alert NAD Heart: RRR , 1/6 sem ,mech  Click , no rub  Lungs: normal WOB  Abdomen: BS pos , soft , NT, ND  Extremities: Bipedal edema 1+  Dialysis Access: patent on HD  R IJ Perm cath   Problem/Plan: 1.  S/P mechanical aortic valve replacement w/ stent repair of coarctation of aorta: With significant deconditioning and reports to be making slow but gradual progress with inpatient rehabilitation with ongoing management of musculoskeletal pain. 2. ESRD: cont TTS HD schedule. He would like to transition back to peritoneal dialysis at some point as an outpatient, he will continue these discussions with his outpatient nephrologist (Dr. Olivia Mackie). HD tomorrow.  3. Anemia ckd: Hgb 8.7. Got 2u prbc on 12/31,  Started darbe 200ug qSat  given 1/02 and 1gm nulecit IV Fe load. 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  CTM.  Ca ok.  5. Nutrition: Continue current renal diet with ongoing ONS/protein supplementation. 6. Hypotension/ volume: on midodrine 10 tid and metop 12.5 bid.Will need new EDW on D/C.     Labs: Basic Metabolic Panel: Recent Labs  Lab 04/29/19 1431 05/01/19 1409 05/03/19 1244  NA 139 136 134*  K 3.8 4.0 4.1  CL 100 98 97*  CO2 23 23 25   GLUCOSE 113* 106* 97  BUN 51* 39* 35*  CREATININE 7.24* 6.32* 5.72*  CALCIUM 9.3 9.1 8.9  PHOS 3.2 2.5 2.8   Liver Function Tests: Recent Labs  Lab 04/29/19 1431 05/01/19 1409 05/03/19 1244  ALBUMIN 2.1* 2.1* 1.9*   No results for  input(s): LIPASE, AMYLASE in the last 168 hours. No results for input(s): AMMONIA in the last 168 hours. CBC: Recent Labs  Lab 04/29/19 1439 05/01/19 0601 05/03/19 1244 05/04/19 0623 05/05/19 0533  WBC 15.8* 12.4* 13.2* 12.3* 13.9*  HGB 7.7* 7.9* 9.1* 8.8* 8.7*  HCT 25.3* 27.0* 30.2* 29.1* 29.2*  MCV 92.0 93.4 94.1 91.5 93.6  PLT 574* 616* 514* 421* 452*   Cardiac Enzymes: No results for input(s): CKTOTAL, CKMB, CKMBINDEX, TROPONINI in the last 168 hours. CBG: No results for input(s): GLUCAP in the last 168 hours.  Studies/Results: DG Chest 2 View  Result Date: 05/04/2019 CLINICAL DATA:  Cough EXAM: CHEST - 2 VIEW COMPARISON:  04/12/2019 FINDINGS: Cardiac shadow is enlarged but stable. Postsurgical changes are noted. Dialysis catheter is again seen. Stable scarring is noted in the right lung base stable from the prior exam. No new focal infiltrate is seen. No bony abnormality is noted. IMPRESSION: Stable scarring in the right lung base. Electronically Signed   By: Inez Catalina M.D.   On: 05/04/2019 16:04   Medications: . ferric gluconate (FERRLECIT/NULECIT) IV Stopped (05/01/19 1721)   . sodium chloride   Intravenous Once  . ALPRAZolam  0.5 mg Oral TID  . Chlorhexidine Gluconate Cloth  6 each Topical Q0600  .  Chlorhexidine Gluconate Cloth  6 each Topical Q0600  . Chlorhexidine Gluconate Cloth  6 each Topical Q0600  . Chlorhexidine Gluconate Cloth  6 each Topical Q0600  . colestipol  4 g Oral TID  . darbepoetin (ARANESP) injection - DIALYSIS  200 mcg Intravenous Q Sat-HD  . guaiFENesin  600 mg Oral BID  . metoCLOPramide  10 mg Oral Daily  . metoCLOPramide  10 mg Oral Daily  . metoprolol tartrate  12.5 mg Oral BID  . midodrine  10 mg Oral TID WC  . multivitamin  1 tablet Oral QHS  . nystatin cream   Topical BID  . pantoprazole  40 mg Oral BID  . polycarbophil  625 mg Oral TID WC  . pregabalin  25 mg Oral QHS  . pregabalin  25 mg Oral Q T,Th,Sa-HD  . psyllium  1 packet  Oral Daily  . saccharomyces boulardii  500 mg Oral TID  . sertraline  100 mg Oral Daily  . sevelamer carbonate  1,600 mg Oral TID WC  . simethicone  80 mg Oral TID  . warfarin  5 mg Oral ONCE-1800  . Warfarin - Pharmacist Dosing Inpatient   Does not apply q1800  . zinc oxide   Topical QID

## 2019-05-05 NOTE — Progress Notes (Signed)
Nutrition Follow-up  RD working remotely.  DOCUMENTATION CODES:   Obesity unspecified  INTERVENTION:   - Wife bringing in [place] protein modular, continue due patient preference (provides 30 grams protein daily)  - Continue double protein portions TID with meals (ordered via [place])  - Continue renal MVI daily  - Continue liberalized Regular diet to promote PO intake  NUTRITION DIAGNOSIS:   Increased nutrient needs related to acute illness, chronic illness (peritonitis) as evidenced by estimated needs.  Ongoing, being addressed via supplements  GOAL:   Patient will meet greater than or equal to 90% of their needs  Progressing  MONITOR:   PO intake, Diet advancement, Supplement acceptance, Labs, Weight trends  REASON FOR ASSESSMENT:   Other (ESRD on PD)    ASSESSMENT:   64 year old patient with recent admission 10/21-11/1 for sepsis due to peritonitis. Pt was readmitted to [facility] 11/11-11/24 for weakness, diarrhea and issues with PD catheter. PD cath was removed and pt transitioned to HD on Tuesday, Thursday and Saturday. PMH of congenital heart disease, HTN, GERD, anxiety, depression and colon polyps. Pt admitted to CIR for significant debility.   Noted plans for d/c to SNF.  Pt now with Reglan ordered due to ? history of gastroparesis.  Last HD on 1/2 with 500 ml net UF. Post-HD weight 130.5 kg. Weight stable since last RD visit.  Unable to reach pt via phone call to room. Will continue with current nutrition interventions.  Meal Completion: 50-100% x last 5 meals  Medications reviewed and include: Colestid, Reglan 10 mg daily, rena-vit, protonix, Fibercon, psyllium, Florastor, Renvela TID, simethicone, warfarin, ferric gluconate with HD  Labs reviewed: sodium 134, chloride 97, hemoglobin 8.7  Diet Order:   Diet Order            Diet regular Room service appropriate? Yes; Fluid consistency: Thin; Fluid restriction: 1500  mL Fluid  Diet effective now              EDUCATION NEEDS:   Not appropriate for education at this time  Skin:  Skin Assessment: Skin Integrity Issues: Skin Integrity Issues: Stage II: right heel Unstageable: left heel Other: non-pressure wound to buttocks  Last BM:  05/05/19  Height:   Ht Readings from Last 1 Encounters:  04/18/19 6\' 2"  (1.88 m)    Weight:   Wt Readings from Last 1 Encounters:  05/05/19 131.9 kg    Ideal Body Weight:  86.4 kg  BMI:  Body mass index is 37.33 kg/m.  Estimated Nutritional Needs:   Kcal:  2600-2800  Protein:  150-175  Fluid:  108ml + UOP    Gaynell Face, MS, RD, LDN Inpatient Clinical Dietitian Pager: [phone] Weekend/After Hours: [phone]

## 2019-05-05 NOTE — Progress Notes (Signed)
[place] PHYSICAL MEDICINE & REHABILITATION PROGRESS NOTE   Subjective/Complaints: Still with mushy stools, thinks it's a combination of food and medications together. Resumed reglan for "hx of gastroparesis" despite ongoing stooling?????   SOB, coughing yesterday, required suctioning of ?amount.   ROS: Patient denies fever, rash, sore throat, blurred vision, nausea, vomiting,     or chest pain, joint or back pain, headache, or mood change.      Objective:   DG Chest 2 View  Result Date: 05/04/2019 CLINICAL DATA:  Cough EXAM: CHEST - 2 VIEW COMPARISON:  04/12/2019 FINDINGS: Cardiac shadow is enlarged but stable. Postsurgical changes are noted. Dialysis catheter is again seen. Stable scarring is noted in the right lung base stable from the prior exam. No new focal infiltrate is seen. No bony abnormality is noted. IMPRESSION: Stable scarring in the right lung base. Electronically Signed   By: Inez Catalina M.D.   On: 05/04/2019 16:04   Recent Labs    05/04/19 0623 05/05/19 0533  WBC 12.3* 13.9*  HGB 8.8* 8.7*  HCT 29.1* 29.2*  PLT 421* 452*   Recent Labs    05/03/19 1244  NA 134*  K 4.1  CL 97*  CO2 25  GLUCOSE 97  BUN 35*  CREATININE 5.72*  CALCIUM 8.9    Intake/Output Summary (Last 24 hours) at 05/05/2019 0958 Last data filed at 05/05/2019 0805 Gross per 24 hour  Intake 430 ml  Output --  Net 430 ml     Physical Exam: Vital Signs Blood pressure (!) 108/49, pulse 88, temperature 97.9 F (36.6 C), temperature source Oral, resp. rate 18, height 6\' 2"  (1.88 m), weight 131.9 kg, SpO2 100 %. Constitutional: No distress . Vital signs reviewed. HEENT: EOMI, oral membranes moist Neck: supple Cardiovascular: RRR without murmur. No JVD    Respiratory: CTA Bilaterally without wheezes or rales. Normal effort    GI: BS +, non-tender, non-distended   HEENT: EOMI, oral membranes moist Neck: supple Cardiovascular: RRR without murmur. No JVD    Respiratory: CTA Bilaterally  without wheezes or rales. Shallow, dysynchronous breathing GI: BS +, non-tender, non-distended  Skin: Warm and dry.  Heel blisters/eschar still silver dollar size left heel. Essentially resolved right heel. Buttock region much improved with only dime sized open area on left cheek   Psych: slightly anxious, slightly flat Musc: Lower extremity edema decreased, trace Neurological: Alert Motor: Bilateral upper extremities: 4/5 proximal distal Right lower extremity: 3 to 3+/5 proximal distal Left lower extremity: 3 to 3+/5--stable---really no change  Assessment/Plan: 1. Functional deficits secondary to debility which require 3+ hours per day of interdisciplinary therapy in a comprehensive inpatient rehab setting.  Physiatrist is providing close team supervision and 24 hour management of active medical problems listed below.  Physiatrist and rehab team continue to assess barriers to discharge/monitor patient progress toward functional and medical goals  Care Tool:  Bathing  Bathing activity did not occur: Refused Body parts bathed by patient: Front perineal area, Buttocks         Bathing assist Assist Level: 2 Helpers     Upper Body Dressing/Undressing Upper body dressing   What is the patient wearing?: Pull over shirt    Upper body assist Assist Level: Supervision/Verbal cueing    Lower Body Dressing/Undressing Lower body dressing      What is the patient wearing?: Pants, Incontinence brief     Lower body assist Assist for lower body dressing: Maximal Assistance - Patient 25 - 49%  Toileting Toileting    Toileting assist Assist for toileting: 2 Helpers     Transfers Chair/bed transfer  Transfers assist  Chair/bed transfer activity did not occur: Safety/medical concerns  Chair/bed transfer assist level: 2 Helpers     Locomotion Ambulation   Ambulation assist   Ambulation activity did not occur: Safety/medical concerns          Walk 10 feet  activity   Assist  Walk 10 feet activity did not occur: Safety/medical concerns        Walk 50 feet activity   Assist Walk 50 feet with 2 turns activity did not occur: Safety/medical concerns         Walk 150 feet activity   Assist Walk 150 feet activity did not occur: Safety/medical concerns         Walk 10 feet on uneven surface  activity   Assist Walk 10 feet on uneven surfaces activity did not occur: Safety/medical concerns         Wheelchair     Assist Will patient use wheelchair at discharge?: Yes Type of Wheelchair: Manual Wheelchair activity did not occur: Safety/medical concerns  Wheelchair assist level: Minimal Assistance - Patient > 75% Max wheelchair distance: 5'    Wheelchair 50 feet with 2 turns activity    Assist    Wheelchair 50 feet with 2 turns activity did not occur: Safety/medical concerns       Wheelchair 150 feet activity     Assist  Wheelchair 150 feet activity did not occur: Safety/medical concerns       Blood pressure (!) 108/49, pulse 88, temperature 97.9 F (36.6 C), temperature source Oral, resp. rate 18, height 6\' 2"  (1.88 m), weight 131.9 kg, SpO2 100 %.  Medical Problem List and Plan: 1.  Impaired mobility and ADLs secondary to debility   Continue CIR   Slow progress working on standing/transfers  -working on placement 2.  St Jude's Aortic Valve/Antithrombotics:  -DVT/anticoagulation:  Pharmaceutical: Coumadin  12/31:  INR 2.1, given 4mg  coumadin  1/1 INR down to 1.6. dosing adjustment today per pharmacy. May need to consider heparin coverage.   1/2- INR 1.7- pt refusing Heparin gtt since more blood draws.   1/3- INR 1.8            -antiplatelet therapy: N/A 3. Pain Management: Oxycodone prn  -resumed lyrica at hs only 25mg , added additional 25 mg postdialysis 4. Mood: LCSW to follow for evaluation and support.              -antipsychotic agents: N/A  -anxiety disorder/ recent MDD    continue  xanax 0.5mg  TID  - improvement in general  -Zoloft increased to home dose of 100 mg on 12/20 5. Neuropsych: This patient is capable of making decisions on his own behalf. 6. Skin/Wound Care: Air mattress overlay for MASD due to loose stool.    -gerhardt's cream to buttocks  -rx loose stools  -prevalon boots bilateral heels  -skin looks much better 7. Fluids/Electrolytes/Nutrition: Strict I/O. Renal diet with 1200 cc FR.  8.   ESRD: Now on HD. Schedule hemodialysis at the end of the day to help with therapy tolerance. TTS schedule   12/10: diet liberalized to help with intake  -volume/edema mgt per nephrology   Filed Weights   05/03/19 1551 05/04/19 0427 05/05/19 0630  Weight: 130.5 kg 132 kg 131.9 kg    Stable on 1/4 9. Hypotension: Monitor BP tid--continue midodrine.    10 History of A  fib/A flutter: Monitor HR -on coumadin. Continue Lopressor 12.5mg  BID. 11. Recent GIB/Anemia of chronic disease: aranesp,.       hgb/tranfusion per nephrology-received 2u prbc 12/31 12. Loose stool: C diff negative  -continue fiber  -increased probiotic to 500mg   TID  -diet--reinforced that consistent PO intake is important also  -12/18 stopped megace given it's lack of benefit and potential for loose stool (although was having loose stool long before this was started)  Appreciate GI recs, medication initiated, await further recs  12/24: Diarrhea resolved. Will change Lomotil back to prn. Continue metamucil which has helped stool be more formed. Appreciate pharmacy's input.   12/27: Increased colestipol frequency upon discussion with patient and wife.  12/30: bloating better off metamucil   -continue fibercon and colestipol  12/31: resumed metamucil once daily only as stool more mushy again  1/1: continue with current plan  1/2- pt's wife reported gastroparesis which might respond slightly to Reglan? Will try Reglan 1x/day instead of TID- at 11am and pt will try metamucil tomorrow.   1/3- Changed  to lunch time Reglan with Non HD days and 5pm on HD days.  1/4 encouraged use of metamucil at least daily. I'm not sure reglan is what we want to be using with ongoing stooling. Will leave on board for now  13. Cough with phlegm: Guaifenesin prn. See below 14. Shortness of breath at rest: encourage IS, anxiety mgt.  -dicussed his breathing (or lack thereof). He is not taking deep, controlled breaths. Rather he is taking shallow, rapid breaths often working against his diaphragm. Anxiety is a major factor. Discussed deep breathing techniques at length today 15. Leukocytosis:  -wbc's 13.9 1/4  Afebrile  -bc negative no growth    16.  Morbid obesity: Encouraged appropriate, regular intake. He's afraid to eat big meals    LOS: 28 days A FACE TO FACE EVALUATION WAS PERFORMED  Meredith Staggers 05/05/2019, 9:58 AM

## 2019-05-05 NOTE — Progress Notes (Signed)
Occupational Therapy Session Note  Patient Details  Name: Calvin [name] MRN: [id number] Date of Birth: October 16, 1955  Today's Date: 05/05/2019 OT Individual Time: 1000-1010 OT Individual Time Calculation (min): 10 min  and Today's Date: 05/05/2019 OT Missed Time: 50 Minutes Missed Time Reason: Patient ill (comment)   Short Term Goals: Week 4:  OT Short Term Goal 1 (Week 4): STG=LTG d/t ELOS  Skilled Therapeutic Interventions/Progress Updates:    Pt received supine, eyes closed and very low tone of voice. Pt reporting he did not sleep well last night and is exhausted. Discussed therapy schedule and goals with pt, pt unable to attend to task for more than several seconds. Vitals assessed: BP 103/52, SpO2 100% and HR 89 bpm. Pt declining any further activity. 50 min missed.   Therapy Documentation Precautions:  Precautions Precautions: Fall Precaution Comments: monitor HR, L heel wound Restrictions Weight Bearing Restrictions: No  Therapy/Group: Individual Therapy  Curtis Sites 05/05/2019, 6:56 AM

## 2019-05-05 NOTE — Progress Notes (Signed)
ANTICOAGULATION CONSULT NOTE - Follow Up Consult  Pharmacy Consult for Warfarin Indication: atrial fibrillation and St. Jude AVR (6387F)  Patient Measurements: Height: 6\' 2"  (188 cm) Weight: 290 lb 12.6 oz (131.9 kg) IBW/kg (Calculated) : 82.2  Vital Signs: Temp: 97.9 F (36.6 C) (01/04 0626) Temp Source: Oral (01/04 0626) BP: 108/49 (01/04 0626) Pulse Rate: 88 (01/04 0626)  Labs: Recent Labs    05/03/19 0348 05/03/19 1244 05/04/19 0623 05/05/19 0533  HGB  --  9.1* 8.8* 8.7*  HCT  --  30.2* 29.1* 29.2*  PLT  --  514* 421* 452*  LABPROT 20.0*  --  20.7* 22.3*  INR 1.7*  --  1.8* 2.0*  CREATININE  --  5.72*  --   --     Estimated Creatinine Clearance: 19.3 mL/min (A) (by C-G formula based on SCr of 5.72 mg/dL (H)).   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 Goal INR range should be 2.5-3.5 given indication; however, per discussion with patient, he was told to aim for 2.5 in mid 2019 by his MD due to history of GIB. Per chart review, appears that GIB events are related to procedures (polypectomy) and GI ulcers.   12/17 Received vitamin K PO 1mg  due to INR 5.4.  Note, patient does not eat any vegetables. Did not have bleeding other than small amounts from rectal wounds.  Patient has required smaller warfarin doses this admission than his reported PTA warfarin dose: 5 mg daily.  However, INR has varied widely 1.9 >> 5 and has been difficult to maintain narrow goal of 2.5-3.  Received consult to consider bridging with IV heparin.  Patient declines IV heparin because he is a hard stick and being on IV heparin requires more frequent blood work.  No bleeding reported.  Meal intake ranges from 0-100% per charting, which could impact the INRs.  INR 2 today  Goal of Therapy:   INR 2.5-3   Plan:  Repeat Warfarin 5mg  PO today Daily PT / INR Will not start IV heparin bridge as patient declined  Anette Guarneri, PharmD Clinical  Pharmacist 05/05/2019 1:08 PM Please check AMION for all [place] numbers    05/05/2019, 1:08 PM

## 2019-05-05 NOTE — Progress Notes (Addendum)
Physical Therapy Session Note  Patient Details  Name: Calvin [name] MRN: [id number] Date of Birth: [birth year]  Today's Date: 05/05/2019 PT Individual Time: 0800-0900, 1300-1330 PT Individual Time Calculation (min): 60 min , 30 min  Short Term Goals:  Week 4:  PT Short Term Goal 1 (Week 4): Pt will participate in 60 min of upright activity w/ minimal increase in fatigue PT Short Term Goal 2 (Week 4): Pt will maintain upright sitting at EOB w/ supervision x15 min consistently Week 5:     Skilled Therapeutic Interventions/Progress Updates:  tx 1:  Pt asleep in bed, but easily awaknened.  He denied pain.  Andree Elk, LPN stated that pt has not had breathing problems today. Discussed plan for today: sitting EOB and using Stedy; pt reluctant as he stated that he had a rough weekend with N and V, but he agreed to try the [place].   Rolling L><R with mod assist for donning brief and boxers.  (pt was dry and clean). Pt assisted with donning briefs slightly by pelvic tilts and grasping boxers in front to pull them up.  O2 sats 98%, HR 85, BP 128/55.    Supine> sit with +2 assist.  Pt sat EOB and scooted forward with max assist.  Sit> stand with +2 assist in [place].  Pt tolerated standing <10 seconds.  He rested in high sitting of the Stedy x 10 min.  BP 127/61; pt c/o dizziness.    He stated that his legs felt "too weak" to attempt standing again.  Sit> supine with mod assist.  Pt scooted to [facility] with +2 and use of bed features. Pt noted to have rapid, shallow breathing.   Dr. Angelique Holm entered.  He stated that pt's breathing rate is not due to lung status, but anxiety.  PT instructed in paced breathing, inhaling through nose and exhaling through pursed lips, with fair carry-over.  PT broached the idea of an app on his phone to facilitate paced breathing.  Pt open to the idea, but did not know if he would be able to manipulate the icons.  At end of session, alarm set and needs at hand.  PT discussed with pt  the success of staying dry despite exertion, and BP remaining stable during high sitting on seat of Stedy x 10 min.   tx 2:  Pt willing to transfer intyo w/c.  Supine> sit using bed features, iwht mod assist (and railing down) to elevate trunk.  Pt needs cues for motor planning pushing through hand.  Beezy board transfer x2 to w/c, level.  From w/c level, with set up, pt did assist with wt shifting and pushing through bil UEs.  Pt brushed teeth with set up from w/c level, at sink.  Pt concerned that he would be left up in the w/c too long.  PT negotiated with him to stay up until 2:00.  He was agreeable.  Needs left at hand and seat belt alarm set.  PT returned at 1400, and assisted pt with help of his wife Barnetta Chapel in returning him to bed, as above.        Therapy Documentation Precautions:  Precautions Precautions: Fall Precaution Comments: monitor HR, L heel wound Restrictions Weight Bearing Restrictions: No      Therapy/Group: Individual Therapy  Denorris Reust 05/05/2019, 12:29 PM

## 2019-05-06 ENCOUNTER — Inpatient Hospital Stay (HOSPITAL_COMMUNITY): Payer: Medicare Other | Admitting: Physical Therapy

## 2019-05-06 ENCOUNTER — Inpatient Hospital Stay (HOSPITAL_COMMUNITY): Payer: Medicare Other

## 2019-05-06 LAB — CBC
HCT: 29.4 % — ABNORMAL LOW (ref 39.0–52.0)
Hemoglobin: 8.8 g/dL — ABNORMAL LOW (ref 13.0–17.0)
MCH: 27.8 pg (ref 26.0–34.0)
MCHC: 29.9 g/dL — ABNORMAL LOW (ref 30.0–36.0)
MCV: 93 fL (ref 80.0–100.0)
Platelets: 506 10*3/uL — ABNORMAL HIGH (ref 150–400)
RBC: 3.16 MIL/uL — ABNORMAL LOW (ref 4.22–5.81)
RDW: 18.2 % — ABNORMAL HIGH (ref 11.5–15.5)
WBC: 14.1 10*3/uL — ABNORMAL HIGH (ref 4.0–10.5)
nRBC: 0 % (ref 0.0–0.2)

## 2019-05-06 LAB — RENAL FUNCTION PANEL
Albumin: 2.2 g/dL — ABNORMAL LOW (ref 3.5–5.0)
Anion gap: 16 — ABNORMAL HIGH (ref 5–15)
BUN: 51 mg/dL — ABNORMAL HIGH (ref 8–23)
CO2: 22 mmol/L (ref 22–32)
Calcium: 8.9 mg/dL (ref 8.9–10.3)
Chloride: 96 mmol/L — ABNORMAL LOW (ref 98–111)
Creatinine, Ser: 7.42 mg/dL — ABNORMAL HIGH (ref 0.61–1.24)
GFR calc Af Amer: 8 mL/min — ABNORMAL LOW (ref >60)
GFR calc non Af Amer: 7 mL/min — ABNORMAL LOW (ref >60)
Glucose, Bld: 104 mg/dL — ABNORMAL HIGH (ref 70–99)
Phosphorus: 3.6 mg/dL (ref 2.5–4.6)
Potassium: 4.2 mmol/L (ref 3.5–5.1)
Sodium: 134 mmol/L — ABNORMAL LOW (ref 135–145)

## 2019-05-06 LAB — PROTIME-INR
INR: 2.1 — ABNORMAL HIGH (ref 0.8–1.2)
Prothrombin Time: 23.5 seconds — ABNORMAL HIGH (ref 11.4–15.2)

## 2019-05-06 MED ORDER — HEPARIN SODIUM (PORCINE) 1000 UNIT/ML IJ SOLN
INTRAMUSCULAR | Status: AC
  Filled 2019-05-06: qty 1

## 2019-05-06 MED ORDER — WARFARIN SODIUM 4 MG PO TABS
4.0000 mg | ORAL_TABLET | Freq: Once | ORAL | Status: AC
  Administered 2019-05-06: 19:00:00 4 mg via ORAL
  Filled 2019-05-06: qty 1

## 2019-05-06 MED ORDER — HEPARIN SODIUM (PORCINE) 1000 UNIT/ML IJ SOLN
INTRAMUSCULAR | Status: AC
  Administered 2019-05-06: 4700 [IU] via ARTERIOVENOUS_FISTULA
  Filled 2019-05-06: qty 4

## 2019-05-06 NOTE — Progress Notes (Signed)
ANTICOAGULATION CONSULT NOTE - Follow Up Consult  Pharmacy Consult for Warfarin Indication: atrial fibrillation and St. Jude AVR (1155M)  Patient Measurements: Height: 6\' 2"  (188 cm) Weight: 293 lb 10.4 oz (133.2 kg) IBW/kg (Calculated) : 82.2  Vital Signs: Temp: 98 F (36.7 C) (01/05 0456) BP: 111/42 (01/05 0456) Pulse Rate: 97 (01/05 0456)  Labs: Recent Labs    05/04/19 0623 05/05/19 0533 05/06/19 0618  HGB 8.8* 8.7*  --   HCT 29.1* 29.2*  --   PLT 421* 452*  --   LABPROT 20.7* 22.3* 23.5*  INR 1.8* 2.0* 2.1*    Estimated Creatinine Clearance: 19.4 mL/min (A) (by C-G formula based on SCr of 5.72 mg/dL (H)).   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 Goal INR range should be 2.5-3.5 given indication; however, per discussion with patient, he was told to aim for 2.5 in mid 2019 by his MD due to history of GIB. Per chart review, appears that GIB events are related to procedures (polypectomy) and GI ulcers.   12/17 Received vitamin K PO 1mg  due to INR 5.4.  Note, patient does not eat any vegetables. Did not have bleeding other than small amounts from rectal wounds.  Patient has required smaller warfarin doses this admission than his reported PTA warfarin dose: 5 mg daily.  However, INR has varied widely 1.9 >> 5 and has been difficult to maintain narrow goal of 2.5-3.  05/02/19 Received consult to consider bridging with IV heparin.  Patient declines IV heparin because he is a hard stick and being on IV heparin requires more frequent blood work.  No bleeding reported.  Meal intake ranges from 0-100% per charting, which could impact the INRs.  INR 2.1 today. Note patient appears to have delayed response to warfarin dose changes and has received 4-5mg  x5 days   Goal of Therapy:   INR 2.5-3   Plan:  Warfarin 4mg  PO today Daily PT / INR   Benetta Spar, PharmD, BCPS, [facility] Clinical Pharmacist  Please check AMION for all [place] phone numbers After 10:00 PM, call [place] [phone]

## 2019-05-06 NOTE — Progress Notes (Signed)
Physical Therapy Weekly Progress Note  Patient Details  Name: Calvin [name] MRN: [id number] Date of Birth: [birth year]  Beginning of progress report period: April 29, 2019 End of progress report period: May 05, 2018  Today's Date: 05/06/2019 PT Individual Time: 0800-0855 PT Individual Time Calculation (min): 55 min   Patient has met 1 of 2 short term goals. Pt continues to make very slow progress towards LTGs week by week, although he has made subtle progress over the last week. He continues to require max assist +1-2 for beasy board transfers and mod assist for bed mobility. He is more consistently able to stand in stedy w/ +2 assist, although standing remains non-functional. Patient continues to demonstrate the following deficits muscle weakness and muscle joint tightness, decreased cardiorespiratoy endurance and decreased sitting balance and decreased balance strategies and therefore will continue to benefit from skilled PT intervention to increase functional independence with mobility. Emphasis will remain on increased OOB tolerance in order to tolerate sitting in OP dialysis recliner for up to 3-4 hours at a time. He is tolerating up to 2-3 hours in TIS, however only 10-20 min in a standard recliner 2/2 bottom pain/discomfort, anxiety, and impaired and inefficient breathing strategies in upright sitting.   Patient progressing toward long term goals. Pt frequency downgraded to 1x/day PT and 1x/day OT 2/2 ongoing poor activity tolerance.   PT Short Term Goals Week 4:  PT Short Term Goal 1 (Week 4): Pt will participate in 60 min of upright activity w/ minimal increase in fatigue PT Short Term Goal 1 - Progress (Week 4): Met PT Short Term Goal 2 (Week 4): Pt will maintain upright sitting at EOB w/ supervision x15 min consistently PT Short Term Goal 2 - Progress (Week 4): Progressing toward goal Week 5:  PT Short Term Goal 1 (Week 5): Pt will tolerate sitting up in recliner for 1 hour in  preparation for OP dialysis recliner PT Short Term Goal 2 (Week 5): Pt will perform R/L rolling w/ min assist PT Short Term Goal 3 (Week 5): Pt will transfer bed<>chair w/ max assist +1 consistently  Skilled Therapeutic Interventions/Progress Updates:   Pt in supine and agreeable to therapy, no c/o pain in supine. Donned shorts w/ R/L rolling and mod assist to roll. Supine>sit w/ min-mod assist w/ verbal and tactile cues for technique. Static sitting for 2-3 min prior to sit>stand in stedy w/ max assist +2. Max verbal, tactile, and manual cues to reach full upright posture and for anterior weight shifting at hips. Stedy transfer to recliner. Sat upright in recliner for 5-6 minutes before reclining for a rest break. Pt w/ increased work of breathing and coughing, however coughing non-productive. Verbal and visual cues for coughing and breathing strategies. Pt continued w/ labored and rattled breathing, spO2 >90% on room air throughout session however. Pt able to cough up small amount of sputum after 5 min, decreased work of breathing after this. Discussed w/ pt goals of week and wrote down on paper to remind him. Max encouragement to sit up in recliner after session to work on tolerance for OP dialysis, however pt ultimately requesting to return to bed. Stedy transfer back to EOB, max assist +2 to stand. Sit>supine. Ended session in supine, all needs in reach. Vitals WNL once in supine, breathing minimally labored once at rest.   Therapy Documentation Precautions:  Precautions Precautions: Fall Precaution Comments: monitor HR, L heel wound Restrictions Weight Bearing Restrictions: No  Therapy/Group: Individual Therapy  Isaac Lacson K  Nichols Corter 05/06/2019, 9:05 AM

## 2019-05-06 NOTE — Patient Care Conference (Signed)
Inpatient RehabilitationTeam Conference and Plan of Care Update Date: 05/06/2019   Time: 10:05 AM    Patient Name: Calvin [name]      Medical Record Number: [id number]  Date of Birth: 04/16/1956 Sex: Male         Room/Bed: 4W11C/4W11C-01 Payor Info: Payor: MEDICARE / Plan: MEDICARE PART A AND B / Product Type: *No Product type* /    Admit Date/Time:  04/26/2019  2:47 PM  Primary Diagnosis:  Physical debility  Patient Active Problem List   Diagnosis Date Noted  . Hemodialysis-associated hypotension   . Subtherapeutic international normalized ratio (INR)   . Morbid obesity ([place])   . ESRD on dialysis ([place])   . Anemia of chronic disease   . Rectal pain   . Leukocytosis   . Major depressive disorder   . Pressure injury of skin 04/17/2019  . Generalized anxiety disorder   . SOB (shortness of breath)   . Physical debility 04/27/2019  . Complex coarctation of the aorta   . Hepatitis C   . AVD (aortic valve disease)   . Long term (current) use of anticoagulants 12/12/2013  . Chronic anticoagulation 07/10/2012  . Gastric ulcer 07/04/2012  . Gout 07/03/2012  . Bilateral lower extremity edema 07/03/2012  . CKD (chronic kidney disease) stage 3, GFR 30-59 ml/min 07/02/2012  . Knee pain 12/03/2011  . Anemia of renal disease 10/04/2011  . Hyperlipidemia 04/23/2011  . 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 S/P aortic valve replacement 04/05/2011  . Thoracic aortic aneurysm ([place]) 04/05/2011  . Atrial fibrillation ([place]) 04/05/2011  . History of atrial flutter s/p DCCV 04/05/2011  . Coarctation of aorta (previous repair and stent) 04/05/2011  . Hypertension 04/05/2011  . CAD (coronary artery disease) 04/05/2011  . [place] ([place]) 04/03/2011  . Anemia, iron deficiency 04/03/2011    Expected Discharge Date: Expected Discharge Date: (Plans for DC to SNF)  Team Members Present: Physician leading conference: Dr. Alger Simons Social Worker Present: Lennart Pall, LCSW Nurse Present: Judee Clara,  LPN Case manager: Karene Fry, RN PT Present: Burnard Bunting, PT OT Present: Laverle Hobby, OT PPS Coordinator present : Ileana Ladd, Burna Mortimer, SLP     Current Status/Progress Goal Weekly Team Focus  Bowel/Bladder   HD patient anuric LBM 1/6 continues to have loose stool Lomotil q4 PRN and scheduled fibercon ordered.  Regain continence of bowel  assess bowel needs qshift and PRN   Swallow/Nutrition/ Hydration             ADL's   Still limited by incontinent BM's of questionable origin. Max +2 slideboard transfers, min A rolling R and L, total A peri hygiene and BM clean up  max A overall, most goals mobility focused  OOB tolerance, transfers, skin integrity, encouragement   Mobility   remains max A +1 to +2 beasy board transfer, sit>stand in stedy w/ max-total assist +2, tolerating standing activity 5-10 min at a time  mod-max assist overall, w/c level  OOB tolerance, progressing standing, BLE strengthening/ROM, bed mobility   Communication             Safety/Cognition/ Behavioral Observations            Pain   No c/o pain  remain free of pain  assess pain qshift and PRN   Skin   Open bleeding MASD to buttocks, desitin, nystatin and PRN A&D ordered  Keep area free of infection, change soil breifs promptly.  Assess skin qshift and PRN    Rehab Goals  Patient on target to meet rehab goals: No *See Care Plan and progress notes for long and short-term goals.     Barriers to Discharge  Current Status/Progress Possible Resolutions Date Resolved   Nursing                  PT                    OT Hemodialysis;Incontinence;Home Copywriter, advertising                SLP                SW                Discharge Planning/Teaching Needs:  Plan has changed to SNF  NA   Team Discussion: Wife wants to give reglan, don't want diarrhea to start, does not want metamucil, wound on bottom is better, breathing is not good, anxiety, needs to take deep breaths.  RN - ?DC reglan,  taking metamucil.  OT subtle changes, sleepy yesterday, max +2, tot LB ADLs, goals max A.  PT max A +1-2 transfers BZ board, steady max +2, up in recliner 20 mins today, bottom soreness, mod/max goals.  SW - has to be able to sit in recliner to go for OP HD.  Plan is for SNF.   Revisions to Treatment Plan: N/A     Medical Summary Current Status: slow progress. stools a little better. still with poor breathing techniques Weekly Focus/Goal: strength, stools, diet, breathing techniques  Barriers to Discharge: Behavior;Weight;Wound care       Continued Need for Acute Rehabilitation Level of Care: The patient requires daily medical management by a physician with specialized training in physical medicine and rehabilitation for the following reasons: Direction of a multidisciplinary physical rehabilitation program to maximize functional independence : Yes Medical management of patient stability for increased activity during participation in an intensive rehabilitation regime.: Yes Analysis of laboratory values and/or radiology reports with any subsequent need for medication adjustment and/or medical intervention. : Yes   I attest that I was present, lead the team conference, and concur with the assessment and plan of the team.   Retta Diones 05/07/2019, 9:44 AM   Team conference was held via web/ teleconference due to [place] - 19

## 2019-05-06 NOTE — Progress Notes (Signed)
Occupational Therapy Session Note  Patient Details  Name: Calvin [name] MRN: [id number] Date of Birth: 05-11-1955  Today's Date: 05/06/2019 OT Individual Time: 4847-2072 OT Individual Time Calculation (min): 55 min    Short Term Goals: Week 4:  OT Short Term Goal 1 (Week 4): STG=LTG d/t ELOS  Skilled Therapeutic Interventions/Progress Updates:    Pt received supine with c/o pain 7/10 in his buttocks. Pt completed bed mobility to the R with min-mod A to transition to EOB, improvement in mobility overall. Pt required max A+2 for use of beasy board to the TIS w/c. Roho cushion in place to aid in buttocks pain and wound healing. Pt required total A for scooting back in chair. Pt SOB and visibly anxious, with cueing/demo provided re controlled breathing/relaxation. Pt completed oral hygiene and grooming tasks at the sink with set up assist.  Pt was taken to the therapy gym where he completed 10 min on the [place] with multiple extended rest breaks. Pt required increased encouragement and c/o butt pain increasing in TIS despite being reclined and on roho cushion. Pt reported incontinent BM and requested to returned to bed. +2 assistance acquired for beasy board transfer. Max A +2 required with heavy cueing for body mechanics. Pt was left supine ,requesting for nursing staff to assist with hygiene instead of OT.   Therapy Documentation Precautions:  Precautions Precautions: Fall Precaution Comments: monitor HR, L heel wound Restrictions Weight Bearing Restrictions: No   Therapy/Group: Individual Therapy  Curtis Sites 05/06/2019, 7:31 AM

## 2019-05-06 NOTE — Progress Notes (Signed)
Subjective:  Seen on way to HD unit.  No concerns.   Objective Vital signs in last 24 hours: Vitals:   05/05/19 0630 05/05/19 1515 05/05/19 2045 05/06/19 0456  BP:  (!) 105/48 (!) 108/47 (!) 111/42  Pulse:  90 86 97  Resp:  17 18 18   Temp:  98.6 F (37 C) 98.2 F (36.8 C) 98 F (36.7 C)  TempSrc:   Oral   SpO2:  98% 95% 100%  Weight: 131.9 kg   133.2 kg  Height:    6\' 2"  (1.88 m)   Weight change: 1.3 kg  Physical Exam: General: in bed headed to HD Lungs: normal WOB  Abdomen: BS pos , soft , NT, ND  Extremities: Bipedal edema 1+  Dialysis Access: patent on HD  R IJ Perm cath   Problem/Plan: 1.  S/P mechanical aortic valve replacement w/ stent repair of coarctation of aorta: With significant deconditioning and reports to be making slow but gradual progress with inpatient rehabilitation with ongoing management of musculoskeletal pain. 2. ESRD: cont TTS HD schedule. He would like to transition back to peritoneal dialysis at some point as an outpatient, he will continue these discussions with his outpatient nephrologist (Dr. Olivia Mackie). HD today.  3. Anemia ckd: Hgb 8.7. Got 2u prbc on 12/31,  Started darbe 200ug qSat  given 1/02 and 1gm nulecit IV Fe load. 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  CTM.  Ca ok.  5. Nutrition: Continue current renal diet with ongoing ONS/protein supplementation. 6. Hypotension/ volume: on midodrine 10 tid and metop 12.5 bid.Will need new EDW on D/C.     Labs: Basic Metabolic Panel: Recent Labs  Lab 04/29/19 1431 05/01/19 1409 05/03/19 1244  NA 139 136 134*  K 3.8 4.0 4.1  CL 100 98 97*  CO2 23 23 25   GLUCOSE 113* 106* 97  BUN 51* 39* 35*  CREATININE 7.24* 6.32* 5.72*  CALCIUM 9.3 9.1 8.9  PHOS 3.2 2.5 2.8   Liver Function Tests: Recent Labs  Lab 04/29/19 1431 05/01/19 1409 05/03/19 1244  ALBUMIN 2.1* 2.1* 1.9*   No results for input(s): LIPASE, AMYLASE in the last 168 hours. No results for input(s): AMMONIA in the last 168  hours. CBC: Recent Labs  Lab 04/29/19 1439 05/01/19 0601 05/03/19 1244 05/04/19 0623 05/05/19 0533  WBC 15.8* 12.4* 13.2* 12.3* 13.9*  HGB 7.7* 7.9* 9.1* 8.8* 8.7*  HCT 25.3* 27.0* 30.2* 29.1* 29.2*  MCV 92.0 93.4 94.1 91.5 93.6  PLT 574* 616* 514* 421* 452*   Cardiac Enzymes: No results for input(s): CKTOTAL, CKMB, CKMBINDEX, TROPONINI in the last 168 hours. CBG: No results for input(s): GLUCAP in the last 168 hours.  Studies/Results: DG Chest 2 View  Result Date: 05/04/2019 CLINICAL DATA:  Cough EXAM: CHEST - 2 VIEW COMPARISON:  04/12/2019 FINDINGS: Cardiac shadow is enlarged but stable. Postsurgical changes are noted. Dialysis catheter is again seen. Stable scarring is noted in the right lung base stable from the prior exam. No new focal infiltrate is seen. No bony abnormality is noted. IMPRESSION: Stable scarring in the right lung base. Electronically Signed   By: Inez Catalina M.D.   On: 05/04/2019 16:04   Medications: . ferric gluconate (FERRLECIT/NULECIT) IV Stopped (05/01/19 1721)   . sodium chloride   Intravenous Once  . ALPRAZolam  0.5 mg Oral TID  . Chlorhexidine Gluconate Cloth  6 each Topical Q0600  . colestipol  4 g Oral TID  . darbepoetin (ARANESP) injection - DIALYSIS  200 mcg Intravenous Q Sat-HD  . metoprolol tartrate  12.5 mg Oral BID  . midodrine  10 mg Oral TID WC  . multivitamin  1 tablet Oral QHS  . nystatin cream   Topical BID  . pantoprazole  40 mg Oral BID  . polycarbophil  625 mg Oral TID WC  . pregabalin  25 mg Oral QHS  . pregabalin  25 mg Oral Q T,Th,Sa-HD  . psyllium  1 packet Oral Daily  . saccharomyces boulardii  500 mg Oral TID  . sertraline  100 mg Oral Daily  . sevelamer carbonate  1,600 mg Oral TID WC  . simethicone  80 mg Oral TID  . Warfarin - Pharmacist Dosing Inpatient   Does not apply q1800  . zinc oxide   Topical QID

## 2019-05-06 NOTE — Progress Notes (Signed)
[place] PHYSICAL MEDICINE & REHABILITATION PROGRESS NOTE   Subjective/Complaints: Continued mushy stools. Progress inhibited in therapy by fatigue, anxiety, breathing.   ROS: Patient denies fever, rash, sore throat, blurred vision, nausea, vomiting, cough, shortness of breath or chest pain, joint or back pain, headache, or mood change.      Objective:   DG Chest 2 View  Result Date: 05/04/2019 CLINICAL DATA:  Cough EXAM: CHEST - 2 VIEW COMPARISON:  04/12/2019 FINDINGS: Cardiac shadow is enlarged but stable. Postsurgical changes are noted. Dialysis catheter is again seen. Stable scarring is noted in the right lung base stable from the prior exam. No new focal infiltrate is seen. No bony abnormality is noted. IMPRESSION: Stable scarring in the right lung base. Electronically Signed   By: Inez Catalina M.D.   On: 05/04/2019 16:04   Recent Labs    05/04/19 0623 05/05/19 0533  WBC 12.3* 13.9*  HGB 8.8* 8.7*  HCT 29.1* 29.2*  PLT 421* 452*   Recent Labs    05/03/19 1244  NA 134*  K 4.1  CL 97*  CO2 25  GLUCOSE 97  BUN 35*  CREATININE 5.72*  CALCIUM 8.9    Intake/Output Summary (Last 24 hours) at 05/06/2019 1147 Last data filed at 05/06/2019 0745 Gross per 24 hour  Intake 440 ml  Output --  Net 440 ml     Physical Exam: Vital Signs Blood pressure (!) 111/42, pulse 97, temperature 98 F (36.7 C), resp. rate 18, height 6\' 2"  (1.88 m), weight 133.2 kg, SpO2 100 %. Constitutional: No distress . Vital signs reviewed. HEENT: EOMI, oral membranes moist Neck: supple Cardiovascular: RRR without murmur. No JVD    Respiratory: CTA Bilaterally without wheezes or rales. Poor air movement overall. Takes rapid, shallow breaths.     GI: BS +, non-tender, non-distended  Neck: supple Cardiovascular: RRR without murmur. No JVD    Respiratory: CTA Bilaterally without wheezes or rales. Shallow, dysynchronous breathing GI: BS +, non-tender, non-distended  Skin: Warm and dry.  Heel  blisters/eschar still silver dollar size left heel. Essentially resolved right heel. Buttock region much improved with only dime sized open area on left cheek present---still tender there Psych: slightly anxious, slightly flat Musc: Lower extremity edema decreased, trace Neurological: Alert Motor: Bilateral upper extremities: 4/5 proximal distal Right lower extremity: 3 to 3+/5 proximal distal Left lower extremity: 3 to 3+/5--stable---no changes  Assessment/Plan: 1. Functional deficits secondary to debility which require 3+ hours per day of interdisciplinary therapy in a comprehensive inpatient rehab setting.  Physiatrist is providing close team supervision and 24 hour management of active medical problems listed below.  Physiatrist and rehab team continue to assess barriers to discharge/monitor patient progress toward functional and medical goals  Care Tool:  Bathing  Bathing activity did not occur: Refused Body parts bathed by patient: Front perineal area, Buttocks         Bathing assist Assist Level: 2 Helpers     Upper Body Dressing/Undressing Upper body dressing   What is the patient wearing?: Pull over shirt    Upper body assist Assist Level: Supervision/Verbal cueing    Lower Body Dressing/Undressing Lower body dressing      What is the patient wearing?: Pants, Incontinence brief     Lower body assist Assist for lower body dressing: Maximal Assistance - Patient 25 - 49%     Toileting Toileting    Toileting assist Assist for toileting: 2 Helpers     Transfers Chair/bed transfer  Transfers assist  Chair/bed transfer activity did not occur: Safety/medical concerns  Chair/bed transfer assist level: Dependent - mechanical lift     Locomotion Ambulation   Ambulation assist   Ambulation activity did not occur: Safety/medical concerns          Walk 10 feet activity   Assist  Walk 10 feet activity did not occur: Safety/medical concerns         Walk 50 feet activity   Assist Walk 50 feet with 2 turns activity did not occur: Safety/medical concerns         Walk 150 feet activity   Assist Walk 150 feet activity did not occur: Safety/medical concerns         Walk 10 feet on uneven surface  activity   Assist Walk 10 feet on uneven surfaces activity did not occur: Safety/medical concerns         Wheelchair     Assist Will patient use wheelchair at discharge?: Yes Type of Wheelchair: Manual Wheelchair activity did not occur: Safety/medical concerns  Wheelchair assist level: Minimal Assistance - Patient > 75% Max wheelchair distance: 5'    Wheelchair 50 feet with 2 turns activity    Assist    Wheelchair 50 feet with 2 turns activity did not occur: Safety/medical concerns       Wheelchair 150 feet activity     Assist  Wheelchair 150 feet activity did not occur: Safety/medical concerns       Blood pressure (!) 111/42, pulse 97, temperature 98 F (36.7 C), resp. rate 18, height 6\' 2"  (1.88 m), weight 133.2 kg, SpO2 100 %.  Medical Problem List and Plan: 1.  Impaired mobility and ADLs secondary to debility   Continue CIR   Slow progress working on standing/transfers  -working on placement 2.  St Jude's Aortic Valve/Antithrombotics:  -DVT/anticoagulation:  Pharmaceutical: Coumadin  1/5 INR 2.1----continue dosing per pharmacy            -antiplatelet therapy: N/A 3. Pain Management: Oxycodone prn  -resumed lyrica at hs only 25mg , added additional 25 mg postdialysis 4. Mood: LCSW to follow for evaluation and support.              -antipsychotic agents: N/A  -anxiety disorder/ recent MDD    continue xanax 0.5mg  TID   -consider buspar trial  - improvement in general  -Zoloft increased to home dose of 100 mg on 12/20 5. Neuropsych: This patient is capable of making decisions on his own behalf. 6. Skin/Wound Care: Air mattress overlay for MASD due to loose stool.    -gerhardt's cream  to buttocks  -rx loose stools  -prevalon boots bilateral heels  -skin looking much better 7. Fluids/Electrolytes/Nutrition: Strict I/O. Renal diet with 1200 cc FR.   -intake sporadic but a little better 8.   ESRD: Now on HD. Schedule hemodialysis at the end of the day to help with therapy tolerance. TTS schedule   12/10: diet liberalized to help with intake  -volume/edema mgt per nephrology   Filed Weights   05/04/19 0427 05/05/19 0630 05/06/19 0456  Weight: 132 kg 131.9 kg 133.2 kg    Weights trending up a bit 9. Hypotension: Monitor BP tid--continue midodrine.    10 History of A fib/A flutter: Monitor HR -on coumadin. Continue Lopressor 12.5mg  BID. 11. Recent GIB/Anemia of chronic disease: aranesp,.       hgb/tranfusion per nephrology-received 2u prbc 12/31 12. Loose stool: C diff negative  -continue fiber  -increased probiotic to 500mg   TID  -  diet--reinforced that consistent PO intake is important also  -12/18 stopped megace given it's lack of benefit and potential for loose stool (although was having loose stool long before this was started)  Appreciate GI recs, medication initiated, await further recs  12/24: Diarrhea resolved. Will change Lomotil back to prn. Continue metamucil which has helped stool be more formed. Appreciate pharmacy's input.   12/27: Increased colestipol frequency upon discussion with patient and wife.  12/30: bloating better off metamucil   -continue fibercon and colestipol  12/31: resumed metamucil once daily only as stool more mushy again  1/1: continue with current plan  1/2- pt's wife reported gastroparesis which might respond slightly to Reglan? Will try Reglan 1x/day instead of TID- at 11am and pt will try metamucil tomorrow.   1/3- Changed to lunch time Reglan with Non HD days and 5pm on HD days.  1/5. Discussed coming off reglan d/t ongoing loose, mushy stool. He agreed.   -will use metamucil once daily between meals  13. Cough with phlegm:  Guaifenesin prn. See below 14. Shortness of breath at rest: encourage IS, anxiety mgt.  -numerous cxr are stable albeit with some chronic changes  -have reviewed improved breathing techniques, relaxation 15. Leukocytosis:  -wbc's 13.9 1/4  Afebrile  -bc negative no growth    16.  Morbid obesity: Encouraged appropriate, regular intake. He's afraid to eat big meals    LOS: 29 days A FACE TO FACE EVALUATION WAS PERFORMED  Meredith Staggers 05/06/2019, 11:47 AM

## 2019-05-07 ENCOUNTER — Inpatient Hospital Stay (HOSPITAL_COMMUNITY): Payer: Medicare Other

## 2019-05-07 LAB — PROTIME-INR
INR: 2.5 — ABNORMAL HIGH (ref 0.8–1.2)
Prothrombin Time: 26.9 seconds — ABNORMAL HIGH (ref 11.4–15.2)

## 2019-05-07 MED ORDER — WARFARIN SODIUM 2 MG PO TABS
2.0000 mg | ORAL_TABLET | Freq: Once | ORAL | Status: AC
  Administered 2019-05-07: 2 mg via ORAL
  Filled 2019-05-07: qty 1

## 2019-05-07 NOTE — Progress Notes (Signed)
ANTICOAGULATION CONSULT NOTE - Follow Up Consult  Pharmacy Consult for Warfarin Indication: atrial fibrillation and St. Jude AVR (1583E)  Patient Measurements: Height: 6\' 2"  (188 cm) Weight: 293 lb 10.4 oz (133.2 kg) IBW/kg (Calculated) : 82.2  Vital Signs: Temp: 97.8 F (36.6 C) (01/06 0304) Temp Source: Oral (01/06 0304) BP: 101/55 (01/06 0304) Pulse Rate: 96 (01/06 0304)  Labs: Recent Labs    05/05/19 0533 05/06/19 0618 05/06/19 1419 05/07/19 0549  HGB 8.7*  --  8.8*  --   HCT 29.2*  --  29.4*  --   PLT 452*  --  506*  --   LABPROT 22.3* 23.5*  --  26.9*  INR 2.0* 2.1*  --  2.5*  CREATININE  --   --  7.42*  --     Estimated Creatinine Clearance: 15 mL/min (A) (by C-G formula based on SCr of 7.42 mg/dL (H)).   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 Goal INR range should be 2.5-3.5 given indication; however, per discussion with patient, he was told to aim for 2.5 in mid 2019 by his MD due to history of GIB. Per chart review, appears that GIB events are related to procedures (polypectomy) and GI ulcers.   12/17 Received vitamin K PO 1mg  due to INR 5.4.  Note, patient does not eat any vegetables. Did not have bleeding other than small amounts from rectal wounds.  Patient has required smaller warfarin doses this admission than his reported PTA warfarin dose: 5 mg daily.  However, INR has varied widely 1.9 >> 5 and has been difficult to maintain narrow goal of 2.5-3.  05/02/19 Received consult to consider bridging with IV heparin.  Patient declines IV heparin because he is a hard stick and being on IV heparin requires more frequent blood work.  No bleeding reported.  Meal intake ranges from 0-100% per charting, which could impact the INRs.  INR 2.5 today. Note patient appears to have delayed response to warfarin dose changes and has received 4-5mg  x6 days   Goal of Therapy:   INR 2.5-3   Plan:  Warfarin 2mg  PO  today Daily PT / INR   Benetta Spar, PharmD, BCPS, [facility] Clinical Pharmacist  Please check AMION for all [place] phone numbers After 10:00 PM, call [place] [phone]

## 2019-05-07 NOTE — Progress Notes (Signed)
Subjective:  HD yesterday.   UF 45mL. Calvin [name] and wife are very confident Calvin [name]'s doing better with the slightly higher EDW.  Calvin [name] has no orthopnea and wife feels LE edema is fairly typical.   Objective Vital signs in last 24 hours: Vitals:   05/06/19 1730 05/06/19 1736 05/06/19 2011 05/07/19 0304  BP: (!) 117/37 (!) 89/33 (!) 98/46 (!) 101/55  Pulse: (!) 114 (!) 111 84 96  Resp:  16 19 16   Temp:  97.7 F (36.5 C) 98.5 F (36.9 C) 97.8 F (36.6 C)  TempSrc:  Oral  Oral  SpO2:  96% 96% 92%  Weight:    133.2 kg  Height:       Weight change: 0 kg  Physical Exam: General: in bed, comfortable Lungs: normal WOB  Abdomen: BS pos , soft , NT, ND  Extremities: Bipedal edema 1+  Dialysis Access: patent on HD  R IJ Perm cath   Problem/Plan: 1.  S/P mechanical aortic valve replacement w/ stent repair of coarctation of aorta: With significant deconditioning and reports to be making slow but gradual progress with inpatient rehabilitation with ongoing management of musculoskeletal pain. 2. ESRD: cont TTS HD schedule. Calvin [name] would like to transition back to peritoneal dialysis at some point as an outpatient, Calvin [name] will continue these discussions with his outpatient nephrologist (Dr. Olivia Mackie). HD tomorrow.  Calvin [name] has some peripheral edema and I'd like to prevent his weight from increasing further.   3. Anemia ckd: Hgb 8.7. Got 2u prbc on 12/31,  Started darbe 200ug qSat  given 1/02 and 1gm nulecit IV Fe load. 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  CTM.  Ca ok.  5. Nutrition: Continue current renal diet with ongoing ONS/protein supplementation. 6. Hypotension/ volume: on midodrine 10 tid and metop 12.5 bid.Will need new EDW on D/C.     Labs: Basic Metabolic Panel: Recent Labs  Lab 05/01/19 1409 05/03/19 1244 05/06/19 1419  NA 136 134* 134*  K 4.0 4.1 4.2  CL 98 97* 96*  CO2 23 25 22   GLUCOSE 106* 97 104*  BUN 39* 35* 51*  CREATININE 6.32* 5.72* 7.42*  CALCIUM 9.1 8.9 8.9  PHOS 2.5 2.8 3.6    Liver Function Tests: Recent Labs  Lab 05/01/19 1409 05/03/19 1244 05/06/19 1419  ALBUMIN 2.1* 1.9* 2.2*   No results for input(s): LIPASE, AMYLASE in the last 168 hours. No results for input(s): AMMONIA in the last 168 hours. CBC: Recent Labs  Lab 05/01/19 0601 05/03/19 1244 05/04/19 0623 05/05/19 0533 05/06/19 1419  WBC 12.4* 13.2* 12.3* 13.9* 14.1*  HGB 7.9* 9.1* 8.8* 8.7* 8.8*  HCT 27.0* 30.2* 29.1* 29.2* 29.4*  MCV 93.4 94.1 91.5 93.6 93.0  PLT 616* 514* 421* 452* 506*   Cardiac Enzymes: No results for input(s): CKTOTAL, CKMB, CKMBINDEX, TROPONINI in the last 168 hours. CBG: No results for input(s): GLUCAP in the last 168 hours.  Studies/Results: No results found. Medications: . ferric gluconate (FERRLECIT/NULECIT) IV 125 mg (05/06/19 1600)   . sodium chloride   Intravenous Once  . ALPRAZolam  0.5 mg Oral TID  . Chlorhexidine Gluconate Cloth  6 each Topical Q0600  . colestipol  4 g Oral TID  . darbepoetin (ARANESP) injection - DIALYSIS  200 mcg Intravenous Q Sat-HD  . metoprolol tartrate  12.5 mg Oral BID  . midodrine  10 mg Oral TID WC  . multivitamin  1 tablet Oral QHS  . nystatin cream   Topical BID  . pantoprazole  40 mg Oral BID  . polycarbophil  625 mg Oral TID WC  . pregabalin  25 mg Oral QHS  . pregabalin  25 mg Oral Q T,Th,Sa-HD  . psyllium  1 packet Oral Daily  . saccharomyces boulardii  500 mg Oral TID  . sertraline  100 mg Oral Daily  . sevelamer carbonate  1,600 mg Oral TID WC  . simethicone  80 mg Oral TID  . warfarin  2 mg Oral ONCE-1800  . Warfarin - Pharmacist Dosing Inpatient   Does not apply q1800  . zinc oxide   Topical QID

## 2019-05-07 NOTE — Procedures (Signed)
Per patient request, NTS was done with moderate amounts of light tan secretions returned.  Patient tolerated procedure well.

## 2019-05-07 NOTE — Progress Notes (Signed)
[place] PHYSICAL MEDICINE & REHABILITATION PROGRESS NOTE   Subjective/Complaints: Mr. Carriger says he needs rest this morning. Had a lot of phlegm last night and requested RT to suction him multiple times last night, with eventual good results.   ROS: Patient denies fever, rash, sore throat, blurred vision, nausea, vomiting, cough, shortness of breath or chest pain, joint or back pain, headache, or mood change.   Objective:   No results found. Recent Labs    05/05/19 0533 05/06/19 1419  WBC 13.9* 14.1*  HGB 8.7* 8.8*  HCT 29.2* 29.4*  PLT 452* 506*   Recent Labs    05/06/19 1419  NA 134*  K 4.2  CL 96*  CO2 22  GLUCOSE 104*  BUN 51*  CREATININE 7.42*  CALCIUM 8.9    Intake/Output Summary (Last 24 hours) at 05/07/2019 0918 Last data filed at 05/06/2019 1736 Gross per 24 hour  Intake 120 ml  Output 462 ml  Net -342 ml     Physical Exam: Vital Signs Blood pressure (!) 101/55, pulse 96, temperature 97.8 F (36.6 C), temperature source Oral, resp. rate 16, height 6\' 2"  (1.88 m), weight 133.2 kg, SpO2 92 %. Constitutional: No distress . Vital signs reviewed. HEENT: EOMI, oral membranes moist Neck: supple Cardiovascular: RRR without murmur. No JVD    Respiratory: CTA Bilaterally without wheezes or rales. Poor air movement overall. Takes rapid, shallow breaths.     GI: BS +, non-tender, non-distended  Neck: supple Cardiovascular: RRR without murmur. No JVD    Respiratory: CTA Bilaterally without wheezes or rales. Shallow, dysynchronous breathing GI: BS +, non-tender, non-distended  Skin: Warm and dry.  Heel blisters/eschar still silver dollar size left heel. Essentially resolved right heel. Buttock region much improved with only dime sized open area on left cheek present---still tender there Psych: slightly anxious, slightly flat Musc: Lower extremity edema decreased, trace Neurological: Alert Motor: Bilateral upper extremities: 4/5 proximal distal Right lower  extremity: 3 to 3+/5 proximal distal Left lower extremity: 3 to 3+/5--stable---no changes  Assessment/Plan: 1. Functional deficits secondary to debility which require 3+ hours per day of interdisciplinary therapy in a comprehensive inpatient rehab setting.  Physiatrist is providing close team supervision and 24 hour management of active medical problems listed below.  Physiatrist and rehab team continue to assess barriers to discharge/monitor patient progress toward functional and medical goals  Care Tool:  Bathing  Bathing activity did not occur: Refused Body parts bathed by patient: Front perineal area, Buttocks         Bathing assist Assist Level: 2 Helpers     Upper Body Dressing/Undressing Upper body dressing   What is the patient wearing?: Pull over shirt    Upper body assist Assist Level: Supervision/Verbal cueing    Lower Body Dressing/Undressing Lower body dressing      What is the patient wearing?: Pants, Incontinence brief     Lower body assist Assist for lower body dressing: Maximal Assistance - Patient 25 - 49%     Toileting Toileting    Toileting assist Assist for toileting: 2 Helpers     Transfers Chair/bed transfer  Transfers assist  Chair/bed transfer activity did not occur: Safety/medical concerns  Chair/bed transfer assist level: Dependent - mechanical lift     Locomotion Ambulation   Ambulation assist   Ambulation activity did not occur: Safety/medical concerns          Walk 10 feet activity   Assist  Walk 10 feet activity did not occur: Safety/medical concerns  Walk 50 feet activity   Assist Walk 50 feet with 2 turns activity did not occur: Safety/medical concerns         Walk 150 feet activity   Assist Walk 150 feet activity did not occur: Safety/medical concerns         Walk 10 feet on uneven surface  activity   Assist Walk 10 feet on uneven surfaces activity did not occur: Safety/medical  concerns         Wheelchair     Assist Will patient use wheelchair at discharge?: Yes Type of Wheelchair: Manual Wheelchair activity did not occur: Safety/medical concerns  Wheelchair assist level: Minimal Assistance - Patient > 75% Max wheelchair distance: 5'    Wheelchair 50 feet with 2 turns activity    Assist    Wheelchair 50 feet with 2 turns activity did not occur: Safety/medical concerns       Wheelchair 150 feet activity     Assist  Wheelchair 150 feet activity did not occur: Safety/medical concerns       Blood pressure (!) 101/55, pulse 96, temperature 97.8 F (36.6 C), temperature source Oral, resp. rate 16, height 6\' 2"  (1.88 m), weight 133.2 kg, SpO2 92 %.  Medical Problem List and Plan: 1.  Impaired mobility and ADLs secondary to debility   Continue CIR   Slow progress working on standing/transfers  -working on placement 2.  St Jude's Aortic Valve/Antithrombotics:  -DVT/anticoagulation:  Pharmaceutical: Coumadin  1/5 INR 2.1  1/6 INR 2.5----continue dosing per pharmacy            -antiplatelet therapy: N/A 3. Pain Management: Oxycodone prn  -resumed lyrica at hs only 25mg , added additional 25 mg postdialysis 4. Mood: LCSW to follow for evaluation and support.              -antipsychotic agents: N/A  -anxiety disorder/ recent MDD    continue xanax 0.5mg  TID   -consider buspar trial  - improvement in general  -Zoloft increased to home dose of 100 mg on 12/20 5. Neuropsych: This patient is capable of making decisions on his own behalf. 6. Skin/Wound Care: Air mattress overlay for MASD due to loose stool.    -gerhardt's cream to buttocks  -rx loose stools  -prevalon boots bilateral heels  -skin looking much better 7. Fluids/Electrolytes/Nutrition: Strict I/O. Renal diet with 1200 cc FR.   -intake sporadic but a little better 8.   ESRD: Now on HD. Schedule hemodialysis at the end of the day to help with therapy tolerance. TTS schedule    12/10: diet liberalized to help with intake  -volume/edema mgt per nephrology Filed Weights   05/05/19 0630 05/06/19 0456 05/07/19 0304  Weight: 131.9 kg 133.2 kg 133.2 kg    Weights trending up a bit 9. Hypotension:   1/6: BP continues to be soft. Monitor BP tid--continue midodrine. 10 History of A fib/A flutter: Monitor HR -on coumadin. Continue Lopressor 12.5mg  BID. 11. Recent GIB/Anemia of chronic disease: aranesp,.       hgb/tranfusion per nephrology-received 2u prbc 12/31 12. Loose stool: C diff negative  -continue fiber  -increased probiotic to 500mg   TID  -diet--reinforced that consistent PO intake is important also  -12/18 stopped megace given it's lack of benefit and potential for loose stool (although was having loose stool long before this was started)  Appreciate GI recs, medication initiated, await further recs  12/24: Diarrhea resolved. Will change Lomotil back to prn. Continue metamucil which has helped stool be more  formed. Appreciate pharmacy's input.   12/27: Increased colestipol frequency upon discussion with patient and wife.  12/30: bloating better off metamucil   -continue fibercon and colestipol  12/31: resumed metamucil once daily only as stool more mushy again  1/1: continue with current plan  1/2- pt's wife reported gastroparesis which might respond slightly to Reglan? Will try Reglan 1x/day instead of TID- at 11am and pt will try metamucil tomorrow.   1/3- Changed to lunch time Reglan with Non HD days and 5pm on HD days.  1/5. Discussed coming off reglan d/t ongoing loose, mushy stool. He agreed.   -will use metamucil once daily between meals  13. Cough with phlegm: Guaifenesin prn. See below 14. Shortness of breath at rest: encourage IS, anxiety mgt.  -numerous cxr are stable albeit with some chronic changes  -have reviewed improved breathing techniques, relaxation 15. Leukocytosis:  -wbc's 13.9 1/4  Afebrile  -bc negative no growth 16.  Morbid obesity:  Encouraged appropriate, regular intake. He's afraid to eat big meals    LOS: 30 days A FACE TO FACE EVALUATION WAS PERFORMED  Martha Clan P Aowyn Rozeboom 05/07/2019, 9:18 AM

## 2019-05-07 NOTE — Progress Notes (Addendum)
Physical Therapy Session Note  Patient Details  Name: Calvin [name] MRN: [id number] Date of Birth: Oct 08, 1955  Today's Date: 05/07/2019 PT Individual [id number]  - 0940, 25 min     Short Term Goals: Week 5:  PT Short Term Goal 1 (Week 5): Pt will tolerate sitting up in recliner for 1 hour in preparation for OP dialysis recliner PT Short Term Goal 2 (Week 5): Pt will perform R/L rolling w/ min assist PT Short Term Goal 3 (Week 5): Pt will transfer bed<>chair w/ max assist +1 consistently Week 6:     Skilled Therapeutic Interventions/Progress Updates:  Pt in bed.  He denied pain except rectum, unrated.  He informed this PT that he was on the bedpan.  PT stated that she would give him time, and returned in 15 min.  Pt unsure if he had had any results.  Rolling R and L in flat bed, no upper rails, mod assist for removal of bedpan, peri care, donning brief and replacing bed pad.  Pt had BM in bedpan.  To scoot up in bed, pt encouraged to push with feet before pulling on headboard; +2 to scoot up.  At end of session, pt in bed with needs at hand, bed alarm set.  PT wrote down pt's success with bedpan on motivation/goal sheets posted in his room.      Therapy Documentation Precautions:  Precautions Precautions: Fall Precaution Comments: monitor HR, L heel wound Restrictions Weight Bearing Restrictions: No      Therapy/Group: Individual Therapy  [name] 05/07/2019, 7:59 AM

## 2019-05-07 NOTE — Progress Notes (Signed)
Occupational Therapy Session Note  Patient Details  Name: Calvin [name] MRN: [id number] Date of Birth: 02-03-1956  Today's Date: 05/07/2019 OT Individual Time: 1030-1100 OT Individual Time Calculation (min): 30 min    Short Term Goals: Week 1:  OT Short Term Goal 1 (Week 1): Pt will don UB clothing EOB with mod A OT Short Term Goal 1 - Progress (Week 1): Met OT Short Term Goal 2 (Week 1): Pt will transfer to EOB with mod A OT Short Term Goal 2 - Progress (Week 1): Not met OT Short Term Goal 3 (Week 1): Pt will reduce anxiety during OT, evidenced by maintaining HR under 130 bpm during bed mobility OT Short Term Goal 3 - Progress (Week 1): Met  Skilled Therapeutic Interventions/Progress Updates:    1;1. Pt received in bed declining any OOT or EOB therapy despite encouragement and education on working on gross mobility to decrease burden of care. Pt states, "I just dont have it today." Pt completes supine shoulder flex/ext and ab/adduciton therex with LUE and min facilitation. Pt educated on self ROM by clasping hands to gether and completes 1x5 reps until max fatigue. Pt lies in bed with MIN A to maintain sidelying to completes 2x10 clamshells with BLE and supine 1x10 SLR for global LE strengthening required for transfers. Exited session with tp seated in bed, exit alarm on and call light in reach.  Therapy Documentation Precautions:  Precautions Precautions: Fall Precaution Comments: monitor HR, L heel wound Restrictions Weight Bearing Restrictions: No General:   Vital Signs:  Pain:   ADL: ADL Eating: Supervision/safety Where Assessed-Eating: Bed level Grooming: Supervision/safety Where Assessed-Grooming: Bed level Upper Body Bathing: Moderate assistance Where Assessed-Upper Body Bathing: Bed level Lower Body Bathing: Dependent Where Assessed-Lower Body Bathing: Bed level Upper Body Dressing: Maximal assistance Where Assessed-Upper Body Dressing: Edge of bed Lower Body  Dressing: Dependent Where Assessed-Lower Body Dressing: Bed level Toileting: Dependent Where Assessed-Toileting: Bed level Toilet Transfer: Unable to assess Vision   Perception    Praxis   Exercises:   Other Treatments:     Therapy/Group: Individual Therapy  Tonny Branch 05/07/2019, 10:58 AM

## 2019-05-08 ENCOUNTER — Inpatient Hospital Stay (HOSPITAL_COMMUNITY): Payer: Medicare Other

## 2019-05-08 LAB — PROTIME-INR
INR: 2.7 — ABNORMAL HIGH (ref 0.8–1.2)
Prothrombin Time: 28.6 seconds — ABNORMAL HIGH (ref 11.4–15.2)

## 2019-05-08 MED ORDER — HEPARIN SODIUM (PORCINE) 1000 UNIT/ML IJ SOLN
INTRAMUSCULAR | Status: AC
  Filled 2019-05-08: qty 5

## 2019-05-08 MED ORDER — WARFARIN SODIUM 2 MG PO TABS
2.0000 mg | ORAL_TABLET | Freq: Once | ORAL | Status: AC
  Administered 2019-05-08: 21:00:00 2 mg via ORAL
  Filled 2019-05-08: qty 1

## 2019-05-08 MED ORDER — HEPARIN SODIUM (PORCINE) 1000 UNIT/ML DIALYSIS: 3000.0000 [IU] | INTRAMUSCULAR | Status: DC | PRN

## 2019-05-08 NOTE — Progress Notes (Signed)
Subjective:  Feels well.  He has no orthopnea and wife feels LE edema is fairly typical.   Objective Vital signs in last 24 hours: Vitals:   05/07/19 0304 05/07/19 1627 05/07/19 2123 05/08/19 0343  BP: (!) 101/55 (!) 102/49 (!) 110/45 (!) 118/58  Pulse: 96 94 84 85  Resp: 16 20 19 18   Temp: 97.8 F (36.6 C)  (!) 97 F (36.1 C) 97.9 F (36.6 C)  TempSrc: Oral     SpO2: 92% 97% 95% 93%  Weight: 133.2 kg     Height:       Weight change:   Physical Exam: General: in bed, comfortable Lungs: normal WOB  Abdomen: BS pos , soft , NT, ND  Extremities: Bipedal edema 1+  - even a bit better than yesterday Dialysis Access: patent on HD  R IJ Perm cath   Problem/Plan: 1.  S/P mechanical aortic valve replacement w/ stent repair of coarctation of aorta: With significant deconditioning and reports to be making slow but gradual progress with inpatient rehabilitation with ongoing management of musculoskeletal pain. 2. ESRD: cont TTS HD schedule. He would like to transition back to peritoneal dialysis at some point as an outpatient, he will continue these discussions with his outpatient nephrologist (Dr. Olivia Mackie). HD today - he's aware of tx time 2.5h due to high volume in hospital; will check AM labs to ensure electrolytes ok.  He has some peripheral edema and I'd like to prevent his weight from increasing further.   3. Anemia ckd: Hgb 8.7. Got 2u prbc on 12/31,  Started darbe 200ug qSat  given 1/02 and 1gm nulecit IV Fe load. 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  CTM.  Ca ok.  5. Nutrition: Continue current renal diet with ongoing ONS/protein supplementation. 6. Hypotension/ volume: on midodrine 10 tid and metop 12.5 bid.Will need new EDW on D/C.     Labs: Basic Metabolic Panel: Recent Labs  Lab 05/01/19 1409 05/03/19 1244 05/06/19 1419  NA 136 134* 134*  K 4.0 4.1 4.2  CL 98 97* 96*  CO2 23 25 22   GLUCOSE 106* 97 104*  BUN 39* 35* 51*  CREATININE 6.32* 5.72* 7.42*  CALCIUM 9.1  8.9 8.9  PHOS 2.5 2.8 3.6   Liver Function Tests: Recent Labs  Lab 05/01/19 1409 05/03/19 1244 05/06/19 1419  ALBUMIN 2.1* 1.9* 2.2*   No results for input(s): LIPASE, AMYLASE in the last 168 hours. No results for input(s): AMMONIA in the last 168 hours. CBC: Recent Labs  Lab 05/03/19 1244 05/04/19 0623 05/05/19 0533 05/06/19 1419  WBC 13.2* 12.3* 13.9* 14.1*  HGB 9.1* 8.8* 8.7* 8.8*  HCT 30.2* 29.1* 29.2* 29.4*  MCV 94.1 91.5 93.6 93.0  PLT 514* 421* 452* 506*   Cardiac Enzymes: No results for input(s): CKTOTAL, CKMB, CKMBINDEX, TROPONINI in the last 168 hours. CBG: No results for input(s): GLUCAP in the last 168 hours.  Studies/Results: No results found. Medications: . ferric gluconate (FERRLECIT/NULECIT) IV 125 mg (05/06/19 1600)   . sodium chloride   Intravenous Once  . ALPRAZolam  0.5 mg Oral TID  . Chlorhexidine Gluconate Cloth  6 each Topical Q0600  . colestipol  4 g Oral TID  . darbepoetin (ARANESP) injection - DIALYSIS  200 mcg Intravenous Q Sat-HD  . metoprolol tartrate  12.5 mg Oral BID  . midodrine  10 mg Oral TID WC  . multivitamin  1 tablet Oral QHS  . nystatin cream   Topical BID  . pantoprazole  40 mg Oral BID  . polycarbophil  625 mg Oral TID WC  . pregabalin  25 mg Oral QHS  . pregabalin  25 mg Oral Q T,Th,Sa-HD  . psyllium  1 packet Oral Daily  . saccharomyces boulardii  500 mg Oral TID  . sertraline  100 mg Oral Daily  . sevelamer carbonate  1,600 mg Oral TID WC  . simethicone  80 mg Oral TID  . warfarin  2 mg Oral ONCE-1800  . Warfarin - Pharmacist Dosing Inpatient   Does not apply q1800  . zinc oxide   Topical QID

## 2019-05-08 NOTE — Progress Notes (Signed)
[facility] PHYSICAL MEDICINE & REHABILITATION PROGRESS NOTE   Subjective/Complaints: Same ongoing issues: intermittent problems with sleep, cough/suctioning, weakness, light-headedness.  ROS: Patient denies fever, rash, sore throat, blurred vision, nausea, vomiting, diarrhea, cough, shortness of breath or chest pain, joint or back pain, headache, or mood change.    Objective:   No results found. Recent Labs    05/06/19 1419  WBC 14.1*  HGB 8.8*  HCT 29.4*  PLT 506*   Recent Labs    05/06/19 1419  NA 134*  K 4.2  CL 96*  CO2 22  GLUCOSE 104*  BUN 51*  CREATININE 7.42*  CALCIUM 8.9    Intake/Output Summary (Last 24 hours) at 05/08/2019 1013 Last data filed at 05/07/2019 1854 Gross per 24 hour  Intake 400 ml  Output --  Net 400 ml     Physical Exam: Vital Signs Blood pressure (!) 118/58, pulse 85, temperature 97.9 F (36.6 C), resp. rate 18, height 6\' 2"  (1.88 m), weight 133.2 kg, SpO2 93 %. Constitutional: No distress . Vital signs reviewed. HEENT: EOMI, oral membranes moist Neck: supple Cardiovascular: RRR without murmur. No JVD    Respiratory: CTA Bilaterally without wheezes or rales. Shallow breaths  GI: BS +, non-tender, non-distended  Neck: supple Cardiovascular: RRR without murmur. No JVD    Respiratory: CTA Bilaterally without wheezes or rales. Shallow, dysynchronous breathing GI: BS +, non-tender, non-distended  Skin: Warm and dry.  Heel blisters/eschar still silver dollar size left heel. Essentially resolved right heel. Buttock region much improved with only dime sized open area on left cheek present---still tender there Psych: slightly anxious, slightly flat--no changes Musc: Lower extremity edema decreased, trace Neurological: Alert Motor: Bilateral upper extremities: 4/5 proximal distal Right lower extremity:   3+/5 proximal distal Left lower extremity:   3+/5--prox to distal  Assessment/Plan: 1. Functional deficits secondary to debility which  require 3+ hours per day of interdisciplinary therapy in a comprehensive inpatient rehab setting.  Physiatrist is providing close team supervision and 24 hour management of active medical problems listed below.  Physiatrist and rehab team continue to assess barriers to discharge/monitor patient progress toward functional and medical goals  Care Tool:  Bathing  Bathing activity did not occur: Refused Body parts bathed by patient: Front perineal area, Buttocks         Bathing assist Assist Level: 2 Helpers     Upper Body Dressing/Undressing Upper body dressing   What is the patient wearing?: Pull over shirt    Upper body assist Assist Level: Supervision/Verbal cueing    Lower Body Dressing/Undressing Lower body dressing      What is the patient wearing?: Pants, Incontinence brief     Lower body assist Assist for lower body dressing: Maximal Assistance - Patient 25 - 49%     Toileting Toileting    Toileting assist Assist for toileting: 2 Helpers     Transfers Chair/bed transfer  Transfers assist  Chair/bed transfer activity did not occur: Safety/medical concerns  Chair/bed transfer assist level: Dependent - mechanical lift     Locomotion Ambulation   Ambulation assist   Ambulation activity did not occur: Safety/medical concerns          Walk 10 feet activity   Assist  Walk 10 feet activity did not occur: Safety/medical concerns        Walk 50 feet activity   Assist Walk 50 feet with 2 turns activity did not occur: Safety/medical concerns         Walk  150 feet activity   Assist Walk 150 feet activity did not occur: Safety/medical concerns         Walk 10 feet on uneven surface  activity   Assist Walk 10 feet on uneven surfaces activity did not occur: Safety/medical concerns         Wheelchair     Assist Will patient use wheelchair at discharge?: Yes Type of Wheelchair: Manual Wheelchair activity did not occur:  Safety/medical concerns  Wheelchair assist level: Minimal Assistance - Patient > 75% Max wheelchair distance: 5'    Wheelchair 50 feet with 2 turns activity    Assist    Wheelchair 50 feet with 2 turns activity did not occur: Safety/medical concerns       Wheelchair 150 feet activity     Assist  Wheelchair 150 feet activity did not occur: Safety/medical concerns       Blood pressure (!) 118/58, pulse 85, temperature 97.9 F (36.6 C), resp. rate 18, height 6\' 2"  (1.88 m), weight 133.2 kg, SpO2 93 %.  Medical Problem List and Plan: 1.  Impaired mobility and ADLs secondary to debility   Continue CIR   -slow progress ongoing. Continue to encourage patient 2.  St Jude's Aortic Valve/Antithrombotics:  -DVT/anticoagulation:  Pharmaceutical: Coumadin  1/7 INR 2.7            -antiplatelet therapy: N/A 3. Pain Management: Oxycodone prn  -resumed lyrica at hs only 25mg , added additional 25 mg postdialysis 4. Mood: LCSW to follow for evaluation and support.              -antipsychotic agents: N/A  -anxiety disorder/ recent MDD    continue xanax 0.5mg  TID   1/7-consider buspar trial---discussed with patient and he doesn't feel that anxiety is a major problem at this point. I beg to differ but will not force patient to try something different  -Zoloft increased to home dose of 100 mg on 12/20---?some benefit 5. Neuropsych: This patient is capable of making decisions on his own behalf. 6. Skin/Wound Care: Air mattress overlay for MASD due to loose stool.    -gerhardt's cream to buttocks  -rx loose stools  -prevalon boots bilateral heels  -skin looking much better 7. Fluids/Electrolytes/Nutrition: Strict I/O. Renal diet with 1200 cc FR.   -intake sporadic but a little better 8.   ESRD: Now on HD. Schedule hemodialysis at the end of the day to help with therapy tolerance. TTS schedule   12/10: diet liberalized to help with intake  -volume/edema mgt per nephrology Filed  Weights   05/05/19 0630 05/06/19 0456 05/07/19 0304  Weight: 131.9 kg 133.2 kg 133.2 kg    Weights trending up a bit 9. Hypotension:   1/7  BP continues to be soft. Monitor BP tid--continue midodrine. 10 History of A fib/A flutter: Monitor HR -on coumadin. Continue Lopressor 12.5mg  BID. 11. Recent GIB/Anemia of chronic disease: aranesp,.       hgb/tranfusion per nephrology-received 2u prbc 12/31 12. Loose stool: C diff negative  -continue fiber  -increased probiotic to 500mg   TID  -diet--reinforced that consistent PO intake is important also  -12/18 stopped megace given it's lack of benefit and potential for loose stool (although was having loose stool long before this was started)  Appreciate GI recs, medication initiated, await further recs  12/24: Diarrhea resolved. Will change Lomotil back to prn. Continue metamucil which has helped stool be more formed. Appreciate pharmacy's input.   12/27: Increased colestipol frequency upon discussion with patient and  wife.  12/30: bloating better off metamucil   -continue fibercon and colestipol  12/31: resumed metamucil once daily only as stool more mushy again  1/1: continue with current plan  1/2- pt's wife reported gastroparesis which might respond slightly to Reglan? Will try Reglan 1x/day instead of TID- at 11am and pt will try metamucil tomorrow.   1/3- Changed to lunch time Reglan with Non HD days and 5pm on HD days.  1/5. Discussed coming off reglan d/t ongoing loose, mushy stool. He agreed.   -will use metamucil once daily between meals   1/7 no changes today 13. Cough with phlegm: Guaifenesin prn. See below 14. Shortness of breath at rest: encourage IS, anxiety mgt.  -numerous cxr are stable albeit with some chronic changes  -have reviewed improved breathing techniques, relaxation 15. Leukocytosis:  -wbc's 13.9 1/4  Afebrile  -bc negative no growth 16.  Morbid obesity: Encouraged appropriate, regular intake. He's afraid to eat big  meals    LOS: 31 days A FACE TO [place] 05/08/2019, 10:13 AM

## 2019-05-08 NOTE — Progress Notes (Signed)
Occupational Therapy Session Note  Patient Details  Name: Calvin [name] MRN: [id number] Date of Birth: 08-Jan-1956  Today's Date: 05/08/2019 OT Individual Time: 1100-1130 OT Individual Time Calculation (min): 30 min    Short Term Goals: Week 1:  OT Short Term Goal 1 (Week 1): Pt will don UB clothing EOB with mod A OT Short Term Goal 1 - Progress (Week 1): Met OT Short Term Goal 2 (Week 1): Pt will transfer to EOB with mod A OT Short Term Goal 2 - Progress (Week 1): Not met OT Short Term Goal 3 (Week 1): Pt will reduce anxiety during OT, evidenced by maintaining HR under 130 bpm during bed mobility OT Short Term Goal 3 - Progress (Week 1): Met  Skilled Therapeutic Interventions/Progress Updates:    1;1. Pt received in TIS agreeable to OT. Pt completes 2 sit to stand in steady from w/c surface, and 2 sit to partial stand from stedy flaps to improve global strengthening in prep for functional transfers. Pt completes 1 min forward and 1 min backward on UBE. Pt agreeable to sitting up in chair till noon and RN aware of pt wish to get back in bed. Pt seated in w/c with RN in room and call light in reach and all needs met  Therapy Documentation Precautions:  Precautions Precautions: Fall Precaution Comments: monitor HR, L heel wound Restrictions Weight Bearing Restrictions: No General:   Vital Signs:  Pain:   ADL: ADL Eating: Supervision/safety Where Assessed-Eating: Bed level Grooming: Supervision/safety Where Assessed-Grooming: Bed level Upper Body Bathing: Moderate assistance Where Assessed-Upper Body Bathing: Bed level Lower Body Bathing: Dependent Where Assessed-Lower Body Bathing: Bed level Upper Body Dressing: Maximal assistance Where Assessed-Upper Body Dressing: Edge of bed Lower Body Dressing: Dependent Where Assessed-Lower Body Dressing: Bed level Toileting: Dependent Where Assessed-Toileting: Bed level Toilet Transfer: Unable to assess Vision   Perception     Praxis   Exercises:   Other Treatments:     Therapy/Group: Individual Therapy  Tonny Branch 05/08/2019, 11:30 AM

## 2019-05-08 NOTE — Progress Notes (Signed)
Physical Therapy Session Note  Patient Details  Name: Calvin [name] MRN: [id number] Date of Birth: Sep 09, 1955  Today's Date: 05/08/2019 PT Individual Time: 6789-3810 PT Individual Time Calculation (min): 35 min   Short Term Goals:  Week 5:  PT Short Term Goal 1 (Week 5): Pt will tolerate sitting up in recliner for 1 hour in preparation for OP dialysis recliner PT Short Term Goal 2 (Week 5): Pt will perform R/L rolling w/ min assist PT Short Term Goal 3 (Week 5): Pt will transfer bed<>chair w/ max assist +1 consistently  Skilled Therapeutic Interventions/Progress Updates:  Pt in bed; Alicia, NT finishing up hygiene change.  Pt motivated to get OOB after discussion with Dr. Izola Price this morning.  Rolling R in nearly flat bed, no rails, mod assist, max assist and max cues for technique to sit up.  Pt tends to slide LEs off of bed, making elevation of trunk harder.  Pt sat EOB x 5 minutes iwht mild c/o of light headedness, which passed.  Stedy + 2, pt's choice, for transfer into wc.  Pt unable to come into fully erect standing position.  Once in wc, PT placed gait belt around thighs for sustained stretch bil hip external rotators and abductors, x 5 minutes.  Therapeutic exercise performed with LE to increase strength for functional mobility; in sitting in tilt in space w/c: 10 x 1 R/L long arc quad knee extensions with focus on full AROM and eccentric control; 2 x 15 bil adductor squeezes.  At end of session, pt tilted back in w/c, seat belt alarm set and needs at hand.  PT advised pt to ask staff to tilt chair back instead of getting back to bed, and to perform glut sets if bottom gets sore.  Pt verbalized understanding.     Therapy Documentation Precautions:  Precautions Precautions: Fall Precaution Comments: monitor HR, L heel wound Restrictions Weight Bearing Restrictions: No      Therapy/Group: Individual Therapy  Rasheedah Reis 05/08/2019, 9:43 AM

## 2019-05-08 NOTE — Progress Notes (Signed)
ANTICOAGULATION CONSULT NOTE - Follow Up Consult  Pharmacy Consult for Warfarin Indication: atrial fibrillation and St. Jude AVR (4008Q)  Patient Measurements: Height: 6\' 2"  (188 cm) Weight: 293 lb 10.4 oz (133.2 kg) IBW/kg (Calculated) : 82.2  Vital Signs: Temp: 97.9 F (36.6 C) (01/07 0343) BP: 118/58 (01/07 0343) Pulse Rate: 85 (01/07 0343)  Labs: Recent Labs    05/06/19 0618 05/06/19 1419 05/07/19 0549 05/08/19 0500  HGB  --  8.8*  --   --   HCT  --  29.4*  --   --   PLT  --  506*  --   --   LABPROT 23.5*  --  26.9* 28.6*  INR 2.1*  --  2.5* 2.7*  CREATININE  --  7.42*  --   --     Estimated Creatinine Clearance: 15 mL/min (A) (by C-G formula based on SCr of 7.42 mg/dL (H)).   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 Goal INR range should be 2.5-3.5 given indication; however, per discussion with patient, he was told to aim for 2.5 in mid 2019 by his MD due to history of GIB. Per chart review, appears that GIB events are related to procedures (polypectomy) and GI ulcers.   12/17 Received vitamin K PO 1mg  due to INR 5.4.  Note, patient does not eat any vegetables. Did not have bleeding other than small amounts from rectal wounds.  Patient has required smaller warfarin doses this admission than his reported PTA warfarin dose: 5 mg daily.  However, INR has varied widely 1.9 >> 5 and has been difficult to maintain narrow goal of 2.5-3.  05/02/19 Received consult to consider bridging with IV heparin.  Patient declines IV heparin because he is a hard stick and being on IV heparin requires more frequent blood work.  No bleeding reported.  Meal intake ranges from 0-100% per charting, which could impact the INRs.  INR 2.7 today. Note patient appears to have delayed response to warfarin dose changes and has received 4-5mg  x6 days   Goal of Therapy:   INR 2.5-3   Plan:  Repeat Warfarin 2mg  PO today Daily PT / INR   Anette Guarneri, PharmD Clinical Pharmacist  Please check AMION for all [facility] phone numbers After 10:00 PM, call [place] [phone]

## 2019-05-09 ENCOUNTER — Inpatient Hospital Stay (HOSPITAL_COMMUNITY): Payer: Medicare Other | Admitting: Anesthesiology

## 2019-05-09 ENCOUNTER — Other Ambulatory Visit: Payer: Self-pay

## 2019-05-09 ENCOUNTER — Inpatient Hospital Stay (HOSPITAL_COMMUNITY): Payer: Medicare Other

## 2019-05-09 ENCOUNTER — Inpatient Hospital Stay (HOSPITAL_COMMUNITY): Payer: Medicare Other | Admitting: Physical Therapy

## 2019-05-09 ENCOUNTER — Inpatient Hospital Stay: Admit: 2019-05-09 | Payer: Medicare Other | Admitting: Pulmonary Disease

## 2019-05-09 LAB — CBC
HCT: 33.2 % — ABNORMAL LOW (ref 39.0–52.0)
Hemoglobin: 9.8 g/dL — ABNORMAL LOW (ref 13.0–17.0)
MCH: 27.3 pg (ref 26.0–34.0)
MCHC: 29.5 g/dL — ABNORMAL LOW (ref 30.0–36.0)
MCV: 92.5 fL (ref 80.0–100.0)
Platelets: 519 10*3/uL — ABNORMAL HIGH (ref 150–400)
RBC: 3.59 MIL/uL — ABNORMAL LOW (ref 4.22–5.81)
RDW: 18.3 % — ABNORMAL HIGH (ref 11.5–15.5)
WBC: 16 10*3/uL — ABNORMAL HIGH (ref 4.0–10.5)
nRBC: 0.1 % (ref 0.0–0.2)

## 2019-05-09 LAB — RENAL FUNCTION PANEL
Albumin: 2.3 g/dL — ABNORMAL LOW (ref 3.5–5.0)
Anion gap: 17 — ABNORMAL HIGH (ref 5–15)
BUN: 22 mg/dL (ref 8–23)
CO2: 25 mmol/L (ref 22–32)
Calcium: 8.7 mg/dL — ABNORMAL LOW (ref 8.9–10.3)
Chloride: 92 mmol/L — ABNORMAL LOW (ref 98–111)
Creatinine, Ser: 4.68 mg/dL — ABNORMAL HIGH (ref 0.61–1.24)
GFR calc Af Amer: 14 mL/min — ABNORMAL LOW
GFR calc non Af Amer: 12 mL/min — ABNORMAL LOW
Glucose, Bld: 112 mg/dL — ABNORMAL HIGH (ref 70–99)
Phosphorus: 2.9 mg/dL (ref 2.5–4.6)
Potassium: 3.7 mmol/L (ref 3.5–5.1)
Sodium: 134 mmol/L — ABNORMAL LOW (ref 135–145)

## 2019-05-09 LAB — COMPREHENSIVE METABOLIC PANEL
ALT: 96 U/L — ABNORMAL HIGH (ref 0–44)
AST: 184 U/L — ABNORMAL HIGH (ref 15–41)
Albumin: 1.8 g/dL — ABNORMAL LOW (ref 3.5–5.0)
Alkaline Phosphatase: 177 U/L — ABNORMAL HIGH (ref 38–126)
Anion gap: 23 — ABNORMAL HIGH (ref 5–15)
BUN: 27 mg/dL — ABNORMAL HIGH (ref 8–23)
CO2: 17 mmol/L — ABNORMAL LOW (ref 22–32)
Calcium: 9.5 mg/dL (ref 8.9–10.3)
Chloride: 96 mmol/L — ABNORMAL LOW (ref 98–111)
Creatinine, Ser: 5.36 mg/dL — ABNORMAL HIGH (ref 0.61–1.24)
GFR calc Af Amer: 12 mL/min — ABNORMAL LOW (ref >60)
GFR calc non Af Amer: 11 mL/min — ABNORMAL LOW (ref >60)
Glucose, Bld: 70 mg/dL (ref 70–99)
Potassium: >7.5 mmol/L (ref 3.5–5.1)
Sodium: 136 mmol/L (ref 135–145)
Total Bilirubin: 0.9 mg/dL (ref 0.3–1.2)
Total Protein: 5.8 g/dL — ABNORMAL LOW (ref 6.5–8.1)

## 2019-05-09 LAB — PROTIME-INR
INR: 2.6 — ABNORMAL HIGH (ref 0.8–1.2)
Prothrombin Time: 27.4 seconds — ABNORMAL HIGH (ref 11.4–15.2)

## 2019-05-09 LAB — LACTIC ACID, PLASMA: Lactic Acid, Venous: >11 mmol/L (ref 0.5–1.9)

## 2019-05-09 LAB — TROPONIN I (HIGH SENSITIVITY): Troponin I (High Sensitivity): 123 ng/L (ref <18)

## 2019-05-09 MED ORDER — SERTRALINE HCL 50 MG PO TABS
50.0000 mg | ORAL_TABLET | Freq: Once | ORAL | Status: AC
  Administered 2019-05-09: 11:00:00 50 mg via ORAL
  Filled 2019-05-09: qty 1

## 2019-05-09 MED ORDER — ATROPINE SULFATE 1 MG/10ML IJ SOSY
PREFILLED_SYRINGE | INTRAMUSCULAR | Status: AC
  Filled 2019-05-09: qty 30

## 2019-05-09 MED ORDER — SERTRALINE HCL 50 MG PO TABS: 150.0000 mg | ORAL_TABLET | Freq: Every day | ORAL | Status: DC

## 2019-05-09 MED ORDER — DICLOFENAC SODIUM 1 % EX GEL
2.0000 g | Freq: Three times a day (TID) | CUTANEOUS | Status: DC
  Administered 2019-05-09: 2 g via TOPICAL
  Filled 2019-05-09: qty 100

## 2019-05-09 MED ORDER — OXANDROLONE 2.5 MG PO TABS
2.5000 mg | ORAL_TABLET | Freq: Two times a day (BID) | ORAL | Status: DC
  Administered 2019-05-09: 11:00:00 2.5 mg via ORAL
  Filled 2019-05-09 (×2): qty 1

## 2019-05-09 MED ORDER — WARFARIN SODIUM 3 MG PO TABS
3.0000 mg | ORAL_TABLET | Freq: Once | ORAL | Status: DC
  Filled 2019-05-09: qty 1

## 2019-05-09 MED ORDER — EPINEPHRINE 1 MG/10ML IJ SOSY
PREFILLED_SYRINGE | INTRAMUSCULAR | Status: AC
  Filled 2019-05-09: qty 10

## 2019-05-10 ENCOUNTER — Inpatient Hospital Stay (HOSPITAL_COMMUNITY): Payer: Medicare Other

## 2019-05-10 MED FILL — Medication: Qty: 1 | Status: AC

## 2019-05-10 NOTE — Progress Notes (Signed)
On call provider was called again, no answer. A message was left again for a call back to update on patient status

## 2019-05-10 NOTE — Progress Notes (Signed)
On call provider was informed of patient status.

## 2019-05-11 ENCOUNTER — Inpatient Hospital Stay (HOSPITAL_COMMUNITY): Payer: Medicare Other | Admitting: Physical Therapy

## 2019-05-11 ENCOUNTER — Inpatient Hospital Stay (HOSPITAL_COMMUNITY): Payer: Medicare Other

## 2019-05-12 ENCOUNTER — Inpatient Hospital Stay (HOSPITAL_COMMUNITY): Payer: Medicare Other

## 2019-05-12 ENCOUNTER — Inpatient Hospital Stay (HOSPITAL_COMMUNITY): Payer: Medicare Other | Admitting: Physical Therapy

## 2019-05-12 MED FILL — Medication: Qty: 1 | Status: AC

## 2019-06-02 NOTE — Progress Notes (Signed)
Subjective:   s/p HD yesterday.  No issues.  UF 1L. Still with limited po intake. Afebrile. He and wife updated at bedside.  Objective Vital signs in last 24 hours: Vitals:   05/08/19 1755 05/08/19 1950 2019/05/16 0416 May 16, 2019 1212  BP: (!) 106/39 (!) 109/48 136/88 (!) 137/48  Pulse: (!) 104 88 85 67  Resp: 18 20 18 18   Temp: 98.1 F (36.7 C) 97.9 F (36.6 C) 97.9 F (36.6 C) 98.3 F (36.8 C)  TempSrc: Oral  Oral   SpO2: 100%  92% 95%  Weight: (!) 137.1 kg  132.5 kg   Height:       Weight change:   Physical Exam: General: in bed, comfortable Lungs: normal WOB  Abdomen: BS pos , soft , NT, ND  Extremities: Bipedal edema 1+  - even a bit better than yesterday Dialysis Access: patent on HD  R IJ Perm cath   Problem/Plan: 1.  S/P mechanical aortic valve replacement w/ stent repair of coarctation of aorta: With significant deconditioning and reports to be making slow but gradual progress with inpatient rehabilitation with ongoing management of musculoskeletal pain. 2. ESRD: cont TTS HD schedule. He would like to transition back to peritoneal dialysis at some point as an outpatient, he will continue these discussions with his outpatient nephrologist (Dr. Olivia Mackie). HD tomorrow.  He has some peripheral edema and I'd like to prevent his weight from increasing further.   3. Anemia ckd: Hgb 8.7 > now 9.8Got 2u prbc on 12/31,  Started darbe 200ug qSat  given 1/02 and 1gm nulecit IV Fe load. 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  CTM.  Ca ok.  5. Nutrition: Continue current renal diet with ongoing ONS/protein supplementation. 6. Hypotension/ volume: on midodrine 10 tid and metop 12.5 bid.Will need new EDW on D/C.     Labs: Basic Metabolic Panel: Recent Labs  Lab 05/03/19 1244 05/06/19 1419 May 16, 2019 0552  NA 134* 134* 134*  K 4.1 4.2 3.7  CL 97* 96* 92*  CO2 25 22 25   GLUCOSE 97 104* 112*  BUN 35* 51* 22  CREATININE 5.72* 7.42* 4.68*  CALCIUM 8.9 8.9 8.7*  PHOS 2.8 3.6 2.9    Liver Function Tests: Recent Labs  Lab 05/03/19 1244 05/06/19 1419 05-16-19 0552  ALBUMIN 1.9* 2.2* 2.3*   No results for input(s): LIPASE, AMYLASE in the last 168 hours. No results for input(s): AMMONIA in the last 168 hours. CBC: Recent Labs  Lab 05/03/19 1244 05/04/19 0623 05/05/19 0533 05/06/19 1419 16-May-2019 0552  WBC 13.2* 12.3* 13.9* 14.1* 16.0*  HGB 9.1* 8.8* 8.7* 8.8* 9.8*  HCT 30.2* 29.1* 29.2* 29.4* 33.2*  MCV 94.1 91.5 93.6 93.0 92.5  PLT 514* 421* 452* 506* 519*   Cardiac Enzymes: No results for input(s): CKTOTAL, CKMB, CKMBINDEX, TROPONINI in the last 168 hours. CBG: No results for input(s): GLUCAP in the last 168 hours.  Studies/Results: No results found. Medications: . ferric gluconate (FERRLECIT/NULECIT) IV 125 mg (05/08/19 1731)   . sodium chloride   Intravenous Once  . ALPRAZolam  0.5 mg Oral TID  . Chlorhexidine Gluconate Cloth  6 each Topical Q0600  . colestipol  4 g Oral TID  . darbepoetin (ARANESP) injection - DIALYSIS  200 mcg Intravenous Q Sat-HD  . diclofenac Sodium  2 g Topical TID  . metoprolol tartrate  12.5 mg Oral BID  . midodrine  10 mg Oral TID WC  . multivitamin  1 tablet Oral QHS  . nystatin cream  Topical BID  . oxandrolone  2.5 mg Oral BID  . pantoprazole  40 mg Oral BID  . polycarbophil  625 mg Oral TID WC  . pregabalin  25 mg Oral QHS  . pregabalin  25 mg Oral Q T,Th,Sa-HD  . psyllium  1 packet Oral Daily  . saccharomyces boulardii  500 mg Oral TID  . [START ON 05/14/2019] sertraline  150 mg Oral Daily  . sevelamer carbonate  1,600 mg Oral TID WC  . simethicone  80 mg Oral TID  . warfarin  3 mg Oral ONCE-1800  . Warfarin - Pharmacist Dosing Inpatient   Does not apply q1800  . zinc oxide   Topical QID

## 2019-06-02 NOTE — Progress Notes (Signed)
Chaplain provided support to Mr. Crass wife.  Chaplain was able to read scripture with Mrs. Bustamante over her husband. Chaplain worked closely with nurses and nurse tech to help Mrs. Hoshino collect her husband's belongings and take them down to her vehicle.  Chaplain gave Mrs. Ishii Patient Placement's number.

## 2019-06-02 NOTE — Progress Notes (Signed)
Patient currently in Code Blue status. Called wife Tye [place] to inform her of husband's status. She is on her way to hospital from [facility]. Gave her the [place] on-call phone number so she could meet her at the main entrance upon arrival at the hospital.

## 2019-06-02 NOTE — Progress Notes (Signed)
Pt did not sleep well during the night, pt continues to have very small loose bm throughout the night. Applied wound care/ointment to inner buttocks every time. Bleeding and pain noted every time he is wiped. Pt requested oxygen [place] at night because he felt anxious, [place] at 1 L applied.

## 2019-06-02 NOTE — Progress Notes (Signed)
Chaplain responded to Code Blue.  Nurse called wife to let her know of husband's current status.  Chaplain will be meeting wife at front of hospital to bring her to husband's room when she arrives from [facility].  Nurse gave wife chaplain's number.  Chaplain will continue to follow-up.

## 2019-06-02 NOTE — Progress Notes (Signed)
On call provider was informed of code blue in progress.

## 2019-06-02 NOTE — Progress Notes (Signed)
PM&R On Call note  Notified by 4W nsg of code in progress, pt stabilized by code team, critical values for lactic acid and troponin and K+  Noted.  Pt needs ICU bed.  Spoke with Richardson Dopp PA from CCM to request CCM follow pt in ICU

## 2019-06-02 NOTE — Anesthesia Procedure Notes (Signed)
Procedure Name: Intubation Date/Time: 2019/06/01 9:04 PM Performed by: Eligha Bridegroom, CRNA Pre-anesthesia Checklist: Patient identified, Emergency Drugs available, Suction available and Patient being monitored Preoxygenation: Pre-oxygenation with 100% oxygen Laryngoscope Size: Mac and 4 Grade View: Grade I Tube type: Oral Tube size: 7.5 mm Number of attempts: 1 Airway Equipment and Method: Stylet Placement Confirmation: ETT inserted through vocal cords under direct vision,  positive ETCO2,  breath sounds checked- equal and bilateral and CO2 detector Secured at: 23 cm Tube secured with: Tape Dental Injury: Teeth and Oropharynx as per pre-operative assessment

## 2019-06-02 NOTE — Progress Notes (Addendum)
CRITICAL VALUE ALERT  Critical Value:  Lactic acid >11, troponin 123 potassium >7.5, CBC contaminated  Date & Time Notied:  2019/06/08 2155  Provider Notified: Dr. Myrtie Hawk  Orders Received/Actions taken: patient is presently in a code blue

## 2019-06-02 NOTE — Progress Notes (Signed)
On call provider was call to update on patient status. Left a voicemail for a call back.

## 2019-06-02 NOTE — Progress Notes (Signed)
Occupational Therapy Session Note  Patient Details  Name: Calvin [name] MRN: [id number] Date of Birth: 09/18/1955  Today's Date: May 31, 2019 OT Individual Time: 1100-1125 OT Individual Time Calculation (min): 25 min    Short Term Goals: Week 1:  OT Short Term Goal 1 (Week 1): Pt will don UB clothing EOB with mod A OT Short Term Goal 1 - Progress (Week 1): Met OT Short Term Goal 2 (Week 1): Pt will transfer to EOB with mod A OT Short Term Goal 2 - Progress (Week 1): Not met OT Short Term Goal 3 (Week 1): Pt will reduce anxiety during OT, evidenced by maintaining HR under 130 bpm during bed mobility OT Short Term Goal 3 - Progress (Week 1): Met  Skilled Therapeutic Interventions/Progress Updates:    1;1. Pt received in bed reporting pain arm 9/10. OT provides PROM and pt reporting pain with external rotation. Educated on using LEs to help push up in bed as opposed to relying on arms to pull up on rails. OT provided gentle massage to supraspinatus, trapezius and infraspinatus. OT applies kinesiotape for improved muscle activation for stabilization of shoulder joint and pain relief. Exited session with pt seated in w/c, call light tin reach and all needs met  Therapy Documentation Precautions:  Precautions Precautions: Fall Precaution Comments: monitor HR, L heel wound Restrictions Weight Bearing Restrictions: No General:   Vital Signs:   Pain: Pain Assessment Pain Scale: 0-10 Pain Score: 9  Pain Type: Acute pain Pain Location: Shoulder Pain Orientation: Right Pain Descriptors / Indicators: Aching;Discomfort Pain Frequency: Intermittent Pain Onset: Progressive Patients Stated Pain Goal: 4 Pain Intervention(s): Medication (See eMAR) ADL: ADL Eating: Supervision/safety Where Assessed-Eating: Bed level Grooming: Supervision/safety Where Assessed-Grooming: Bed level Upper Body Bathing: Moderate assistance Where Assessed-Upper Body Bathing: Bed level Lower Body Bathing:  Dependent Where Assessed-Lower Body Bathing: Bed level Upper Body Dressing: Maximal assistance Where Assessed-Upper Body Dressing: Edge of bed Lower Body Dressing: Dependent Where Assessed-Lower Body Dressing: Bed level Toileting: Dependent Where Assessed-Toileting: Bed level Toilet Transfer: Unable to assess Vision   Perception    Praxis   Exercises:   Other Treatments:     Therapy/Group: Individual Therapy  Tonny Branch May 31, 2019, 11:29 AM

## 2019-06-02 NOTE — Code Documentation (Signed)
  Patient Name: Calvin [name]   MRN: [id number]   Date of Birth/ Sex: Jun 19, 1955 , male      Admission Date: (Not on file)  Attending Provider: Anders Simmonds, MD  Primary Diagnosis: Cardiac Arrest   Indication: Pt was in his usual state of health until this PM, when he was noted to be unresponsive. Code blue was subsequently called. At the time of arrival on scene, ACLS protocol was underway.   Technical Description:  - CPR performance duration:  70 minutes  - Was defibrillation or cardioversion used? Yes  - Was external pacer placed? Yes  - Was patient intubated pre/post CPR? Yes   Medications Administered: Y = Yes; Blank = No Amiodarone    Atropine  Y  Calcium  Y  Epinephrine  Y  Lidocaine    Magnesium    Norepinephrine  Y  Phenylephrine    Sodium bicarbonate  Y  Vasopressin    Other D5, Insulin   Post CPR evaluation:  - Final Status - Was patient successfully resuscitated ? No   Notes: During code, patient was intermittently PEA, V. Fib, bradycardic with weak pulse and code was continued for 70 minutes. Patient family member came to bedside and requested termination of code.  Miscellaneous Information:  - Time of death:  08/11/03 on 2019-05-29  - Primary team notified?  Yes  - Family Notified? Yes     Jeanmarie Hubert, MD   2019-05-29, 10:26 PM

## 2019-06-02 NOTE — Progress Notes (Signed)
On call provider was called. No answer. Message was left for a call back.

## 2019-06-02 NOTE — Progress Notes (Signed)
On call provider was called again, no answer. Will try again

## 2019-06-02 NOTE — Death Summary Note (Signed)
DEATH SUMMARY   Patient Details  Name: Calvin [name] MRN: [id number] DOB: 03-14-1956  Admission/Discharge Information   Admit Date:  2019/05/05  Date of Death: Date of Death: 2019-06-06  Time of Death: Time of Death: 2203/08/19  Length of Stay: 08/19/31  Referring Physician: Larene Beach, MD   Reason(s) for Hospitalization  Debility  Diagnoses  Preliminary cause of death: Cardiac arrest Secondary Diagnoses (including complications and co-morbidities):  Principal Problem:   Physical debility Active Problems:   SOB (shortness of breath)   Generalized anxiety disorder   Pressure injury of skin   Subtherapeutic international normalized ratio (INR)   Morbid obesity ([place])   ESRD on dialysis ([place])   Anemia of chronic disease   Rectal pain   Leukocytosis   Major depressive disorder   Hemodialysis-associated hypotension   Brief Hospital Course (including significant findings, care, treatment, and services provided and events leading to death)  Calvin [name] is a 63 y.o. year old male with history of CAD s/p repair with stent of coarctation of aorta, St. Jude's AVR, testicular cancer, T2DM with neuropathy, chronic pain, ESRD on PD who was originally admitted with sepsis due to peritonitis complicated by PD catheter infection.  He was treated with vancomycin and meropenem per ID at [facility] and then transferred to [facility] for further management.  He was transitioned to hemodialysis and midodrine added for BP support due to persistent hypotension.  He continued to have issues with chronic diarrhea as well as significant debility.  He was admitted to CIR on 05-May-2019 for intensive rehab program.    He continued to have issues with diarrhea as well as MASD with poor p.o. intake, pain and high levels of anxiety.   Nutritional supplements and air mattress was ordered to help promote wound healing however patient declined these interventions. His diet was liberalized to regular but intake  remained poor. GI was consulted for input on diarrhea and probiotics, colestipol, lomotil and fiber added to help manage symptoms  He continued to have high levels of anxiety as well as depression therefore Zoloft was increased to 1000 mg and BuSpar added.  HD has been ongoing with adjustments to help with fluid overload.  Anemia of chronic disease treated with 2 units PRBC on 12/31.    His activity tolerance was slowly improving and he required encouragement for consistent participation in therapy. He continued to have significant deficits requiring  mod to total assist with ADLs, mod assist +2 for bed mobility and required mechanical lift for transfers.   SNF search was underway as family was unable to provide care needed.  On June 06, 2019 evening, patient was found unresponsive and pulseless by RN and Code Blue activated. He underwent ACLS protocol with CPR X 70 minutes with intermittent  PEA, V Fib, bradycardia with weak pulse. Code terminated as patient could not be successfully resuscitated and patient expired at 08-19-03 hrs on 2019/06/06.      Pertinent Labs and Studies  Significant Diagnostic Studies DG Chest 2 View  Result Date: 05/04/2019 CLINICAL DATA:  Cough EXAM: CHEST - 2 VIEW COMPARISON:  04/12/2019 FINDINGS: Cardiac shadow is enlarged but stable. Postsurgical changes are noted. Dialysis catheter is again seen. Stable scarring is noted in the right lung base stable from the prior exam. No new focal infiltrate is seen. No bony abnormality is noted. IMPRESSION: Stable scarring in the right lung base. Electronically Signed   By: Inez Catalina M.D.   On: 05/04/2019 16:04   DG  Chest 2 View NOW  Result Date: 04/12/2019 CLINICAL DATA:  Shortness of breath EXAM: CHEST - 2 VIEW COMPARISON:  04/09/2019 FINDINGS: Cardiac shadow remains enlarged. Distal aortic arch stent is noted related to the known history of coarctation of the aorta. Right jugular dialysis catheter is again seen. Platelike atelectasis is  noted in right lung base with associated right-sided effusion. The overall appearance is stable. The left lung appears clear. Postsurgical changes in the left chest wall are noted. IMPRESSION: Stable appearance of the chest when compared with the prior study. No significant edema is noted. Electronically Signed   By: Inez Catalina M.D.   On: 04/12/2019 19:20    Microbiology No results found for this or any previous visit (from the past 240 hour(s)).  Lab Basic Metabolic Panel: Recent Labs  Lab 05/06/19 1419 05/14/19 0552 05/14/19 2110  NA 134* 134* 136  K 4.2 3.7 >7.5*  CL 96* 92* 96*  CO2 22 25 17*  GLUCOSE 104* 112* 70  BUN 51* 22 27*  CREATININE 7.42* 4.68* 5.36*  CALCIUM 8.9 8.7* 9.5  PHOS 3.6 2.9  --    Liver Function Tests: Recent Labs  Lab 05/06/19 1419 05/14/19 0552 2019-05-14 2110  AST  --   --  184*  ALT  --   --  96*  ALKPHOS  --   --  177*  BILITOT  --   --  0.9  PROT  --   --  5.8*  ALBUMIN 2.2* 2.3* 1.8*   No results for input(s): LIPASE, AMYLASE in the last 168 hours. No results for input(s): AMMONIA in the last 168 hours. CBC: Recent Labs  Lab 05/06/19 1419 2019/05/14 0552  WBC 14.1* 16.0*  HGB 8.8* 9.8*  HCT 29.4* 33.2*  MCV 93.0 92.5  PLT 506* 519*   Cardiac Enzymes: No results for input(s): CKTOTAL, CKMB, CKMBINDEX, TROPONINI in the last 168 hours. Sepsis Labs: Recent Labs  Lab 05/06/19 1419 2019/05/14 0552 05/14/2019 2108  WBC 14.1* 16.0*  --   LATICACIDVEN  --   --  >11*    Procedures/Operations  N/A   Bary Leriche 05/12/2019, 12:27 PM

## 2019-06-02 NOTE — Significant Event (Addendum)
At 2000, Patient was checked on and had  no signs of respiratory distress by Erline Levine, RN. At 2055, Mickel Baas RN checked on pt and he was not breathing, and pulseless. Code blue was called and CPR was started immediately.   Wife arrived around 2205 and requested termination of CPR and everyone to leave the room. Staff exited the room and chaplain stayed with wife in room.

## 2019-06-02 NOTE — Progress Notes (Signed)
[place] PHYSICAL MEDICINE & REHABILITATION PROGRESS NOTE   Subjective/Complaints: States he interesting in trying something different for anxiety. Didn't sleep well last night. Right shoulder sore. Appetite still poor. Felt something "bulge" from his RLQ last night when wife was massaging an pushing on his back.   ROS: Patient denies fever, rash, sore throat, blurred vision, nausea, vomiting, diarrhea, cough, shortness of breath or chest pain, joint or back pain, headache, or mood change.    Objective:   No results found. Recent Labs    05/06/19 1419 05-22-19 0552  WBC 14.1* 16.0*  HGB 8.8* 9.8*  HCT 29.4* 33.2*  PLT 506* 519*   Recent Labs    05/06/19 1419 May 22, 2019 0552  NA 134* 134*  K 4.2 3.7  CL 96* 92*  CO2 22 25  GLUCOSE 104* 112*  BUN 51* 22  CREATININE 7.42* 4.68*  CALCIUM 8.9 8.7*    Intake/Output Summary (Last 24 hours) at 2019/05/22 0958 Last data filed at 05-22-19 0730 Gross per 24 hour  Intake 440 ml  Output 1000 ml  Net -560 ml     Physical Exam: Vital Signs Blood pressure 136/88, pulse 85, temperature 97.9 F (36.6 C), temperature source Oral, resp. rate 18, height 6\' 2"  (1.88 m), weight 132.5 kg, SpO2 92 %. Constitutional: No distress . Vital signs reviewed. HEENT: EOMI, oral membranes moist Neck: supple Cardiovascular: RRR without murmur. No JVD    Respiratory: CTA Bilaterally without wheezes or rales. On O2 [place], short breaths  GI: BS +, sl-tender, non-distended, no abdominal wall weakness, signs of hernia  Skin: Warm and dry.  Heel blisters/eschar still silver dollar size left heel. Essentially resolved right heel wound. Scarring LUQ.Marland Kitchen Buttock region much improved with only dime sized open area on left cheek present---still tender there Psych: remains slightly anxious Musc: Lower extremity edema decreased, trace ongoing. Right shoulder pain with ABD/IR, TTP at right ACJ Neurological: Alert Motor: Bilateral upper extremities: 4/5 proximal  distal Right lower extremity:   3+/5 proximal distal Left lower extremity:   3+/5--prox to distal  Assessment/Plan: 1. Functional deficits secondary to debility which require 3+ hours per day of interdisciplinary therapy in a comprehensive inpatient rehab setting.  Physiatrist is providing close team supervision and 24 hour management of active medical problems listed below.  Physiatrist and rehab team continue to assess barriers to discharge/monitor patient progress toward functional and medical goals  Care Tool:  Bathing  Bathing activity did not occur: Refused Body parts bathed by patient: Front perineal area, Buttocks         Bathing assist Assist Level: 2 Helpers     Upper Body Dressing/Undressing Upper body dressing   What is the patient wearing?: Pull over shirt    Upper body assist Assist Level: Supervision/Verbal cueing    Lower Body Dressing/Undressing Lower body dressing      What is the patient wearing?: Pants, Incontinence brief     Lower body assist Assist for lower body dressing: Maximal Assistance - Patient 25 - 49%     Toileting Toileting    Toileting assist Assist for toileting: 2 Helpers     Transfers Chair/bed transfer  Transfers assist  Chair/bed transfer activity did not occur: Safety/medical concerns  Chair/bed transfer assist level: Dependent - mechanical lift     Locomotion Ambulation   Ambulation assist   Ambulation activity did not occur: Safety/medical concerns          Walk 10 feet activity   Assist  Walk 10 feet  activity did not occur: Safety/medical concerns        Walk 50 feet activity   Assist Walk 50 feet with 2 turns activity did not occur: Safety/medical concerns         Walk 150 feet activity   Assist Walk 150 feet activity did not occur: Safety/medical concerns         Walk 10 feet on uneven surface  activity   Assist Walk 10 feet on uneven surfaces activity did not occur:  Safety/medical concerns         Wheelchair     Assist Will patient use wheelchair at discharge?: Yes Type of Wheelchair: Manual Wheelchair activity did not occur: Safety/medical concerns  Wheelchair assist level: Minimal Assistance - Patient > 75% Max wheelchair distance: 5'    Wheelchair 50 feet with 2 turns activity    Assist    Wheelchair 50 feet with 2 turns activity did not occur: Safety/medical concerns       Wheelchair 150 feet activity     Assist  Wheelchair 150 feet activity did not occur: Safety/medical concerns       Blood pressure 136/88, pulse 85, temperature 97.9 F (36.6 C), temperature source Oral, resp. rate 18, height 6\' 2"  (1.88 m), weight 132.5 kg, SpO2 92 %.  Medical Problem List and Plan: 1.  Impaired mobility and ADLs secondary to debility   Continue CIR   -slow progress ongoing  -daily encouragement and ego support 2.  St Jude's Aortic Valve/Antithrombotics:  -DVT/anticoagulation:  Pharmaceutical: Coumadin  1/8 INR 2.6            -antiplatelet therapy: N/A 3. Pain Management: Oxycodone prn  -resumed lyrica at hs only 25mg , added additional 25 mg postdialysis  1/8-add voltaren gel Right shoulder. ?mild RTC/AC jt arthritis 4. Mood: LCSW to follow for evaluation and support.              -antipsychotic agents: N/A  -anxiety disorder/ recent MDD    continue xanax 0.5mg  TID  1/8: considered buspar but really too risky with ESRD    -will increase zoloft to 150mg  daily 5. Neuropsych: This patient is capable of making decisions on his own behalf. 6. Skin/Wound Care: Air mattress overlay for MASD due to loose stool.    -gerhardt's cream to buttocks  -rx loose stools  -prevalon boots bilateral heels  -skin gradually improving 7. Fluids/Electrolytes/Nutrition: Strict I/O. Renal diet with 1200 cc FR.   -intake sporadic to poor  -1/8 begin oxandrin trial 2.5 mg bid 8.   ESRD: Now on HD. Schedule hemodialysis at the end of the day to  help with therapy tolerance. TTS schedule   12/10: diet liberalized to help with intake  -volume/edema mgt per nephrology Filed Weights   05/08/19 1520 05/08/19 1755 05-26-2019 0416  Weight: (!) 137.8 kg (!) 137.1 kg 132.5 kg    Weights trending up a bit 9. Hypotension:   1/8  BP stable. Monitor BP tid--continue midodrine. 10 History of A fib/A flutter: Monitor HR -on coumadin. Continue Lopressor 12.5mg  BID. 11. Recent GIB/Anemia of chronic disease: aranesp,.       hgb/tranfusion per nephrology-  1/8 hgb 9.8 12. Loose stool: C diff negative  -continue fiber  -increased probiotic to 500mg   TID  -diet--reinforced that consistent PO intake is important also  -12/18 stopped megace given it's lack of benefit and potential for loose stool (although was having loose stool long before this was started)  Appreciate GI recs, medication initiated, await further  recs  12/24: Diarrhea resolved. Will change Lomotil back to prn. Continue metamucil which has helped stool be more formed. Appreciate pharmacy's input.   12/27: Increased colestipol frequency upon discussion with patient and wife.  12/30: bloating better off metamucil   -continue fibercon and colestipol  12/31: resumed metamucil once daily only as stool more mushy again  1/1: continue with current plan  1/2- pt's wife reported gastroparesis which might respond slightly to Reglan? Will try Reglan 1x/day instead of TID- at 11am and pt will try metamucil tomorrow.   1/3- Changed to lunch time Reglan with Non HD days and 5pm on HD days.  1/5. Discussed coming off reglan d/t ongoing loose, mushy stool. He agreed.   -will use metamucil once daily between meals   1/8 continue with current plan 13. Cough with phlegm: Guaifenesin prn. See below 14. Shortness of breath at rest: encourage IS, anxiety mgt.  -numerous cxr are stable albeit with some chronic changes  -have reviewed improved breathing techniques, relaxation 15. Leukocytosis:  -wbc's  13.9 1/4  Afebrile  -bc negative no growth 16.  Morbid obesity: Encouraged appropriate, regular intake. He's afraid to eat big meals    LOS: 32 days A FACE TO FACE EVALUATION WAS PERFORMED  Meredith Staggers 06/07/19, 9:58 AM

## 2019-06-02 NOTE — Progress Notes (Signed)
Physical Therapy Session Note  Patient Details  Name: Calvin [name] MRN: [id number] Date of Birth: 1955/09/12  Today's Date: 05-17-19 PT Individual Time: 0902-0930 PT Individual Time Calculation (min): 28 min   Short Term Goals: Week 1:  PT Short Term Goal 1 (Week 1): Pt will initiate OOB mobility PT Short Term Goal 1 - Progress (Week 1): Met PT Short Term Goal 2 (Week 1): Pt will perform bed mobility w/ mod assist +1 PT Short Term Goal 2 - Progress (Week 1): Not met PT Short Term Goal 3 (Week 1): Pt will initiate w/c propulsion PT Short Term Goal 3 - Progress (Week 1): Met Week 2:  PT Short Term Goal 1 (Week 2): Pt will perform bed mobility with mod assist PT Short Term Goal 1 - Progress (Week 2): Met PT Short Term Goal 2 (Week 2): Pt will be able to propel w/c x 50' with supervision PT Short Term Goal 2 - Progress (Week 2): Progressing toward goal PT Short Term Goal 3 (Week 2): Pt will be able to perform bed<>chair transfers with max +1 assist PT Short Term Goal 3 - Progress (Week 2): Met Week 3:  PT Short Term Goal 1 (Week 3): Pt will perform sit<>stand w/ max assist +1 PT Short Term Goal 1 - Progress (Week 3): Not progressing PT Short Term Goal 2 (Week 3): Pt will tolerate sitting up and OOB x3 hrs/day PT Short Term Goal 2 - Progress (Week 3): Met PT Short Term Goal 3 (Week 3): Pt will self-propel manual w/c x 25' w/ supervision PT Short Term Goal 3 - Progress (Week 3): Not progressing PT Short Term Goal 4 (Week 3): Pt will maintain upright sitting at EOB w/ supervision x10 min consistently PT Short Term Goal 4 - Progress (Week 3): Met  Skilled Therapeutic Interventions/Progress Updates:      Therapy Documentation Precautions:  Precautions Precautions: Fall Precaution Comments: monitor HR, L heel wound Restrictions Weight Bearing Restrictions: No PAIN 8/10 R shoulder, pt refusing to get up oob this am due to pain in L shoulder.  Performed gentle PROM and oscillations  which did not improve pain.   Pt initially supine and encouraged pt to participate w/transfer OOB then to sitting on EOB, would agree only to therex in supine position.    Gentle PROM including elbow flex/ext, shoulder IR/ER - pt stated pain too intense to continue. However, pt able to roll fully onto L side w/changing brief with no c/o's.   Supine therex included - heel slides AAROM 2x10, TKEs 2 x 15.  Pt stated he had bm.  Pt able to roll fully to R side using bed rail and cues, maintains w/mod assist.  Nursing and PT assisting therapist w/perineal hygiene and changing brief.  Pt assists by rolling to L and R w/rails, attempting to bridge for scooting laterally and up in bed (placed in trendelenberg) both with require max assist.   Pt continues to be limited by self limiting behaviors.  Therapy/Group: Individual Therapy  Callie Fielding, [place] May 17, 2019, 12:52 PM

## 2019-06-02 NOTE — Progress Notes (Signed)
ANTICOAGULATION CONSULT NOTE - Follow Up Consult  Pharmacy Consult for Warfarin Indication: atrial fibrillation and [employer]. Jude AVR (4680H)  Patient Measurements: Height: 6\' 2"  (188 cm) Weight: 292 lb 1.8 oz (132.5 kg) IBW/kg (Calculated) : 82.2  Vital Signs: Temp: 97.9 F (36.6 C) (01/08 0416) Temp Source: Oral (01/08 0416) BP: 136/88 (01/08 0416) Pulse Rate: 85 (01/08 0416)  Labs: Recent Labs    05/06/19 1419 05/07/19 0549 05/08/19 0500 2019/05/31 0552  HGB 8.8*  --   --  9.8*  HCT 29.4*  --   --  33.2*  PLT 506*  --   --  519*  LABPROT  --  26.9* 28.6* 27.4*  INR  --  2.5* 2.7* 2.6*  CREATININE 7.42*  --   --  4.68*    Estimated Creatinine Clearance: 23.7 mL/min (A) (by C-G formula based on SCr of 4.68 mg/dL (H)).   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 Goal INR range should be 2.5-3.5 given indication; however, per discussion with patient, he was told to aim for 2.5 in mid 2019 by his MD due to history of GIB. Per chart review, appears that GIB events are related to procedures (polypectomy) and GI ulcers.   12/17 Received vitamin K PO 1mg  due to INR 5.4.  Note, patient does not eat any vegetables. Did not have bleeding other than small amounts from rectal wounds.  Patient has required smaller warfarin doses this admission than his reported PTA warfarin dose: 5 mg daily.  However, INR has varied widely 1.9 >> 5 and has been difficult to maintain narrow goal of 2.5-3.  05/02/19 Received consult to consider bridging with IV heparin.  Patient declines IV heparin because he is a hard stick and being on IV heparin requires more frequent blood work.  No bleeding reported.  Meal intake ranges from 0-100% per charting, which could impact the INRs.  INR 2.6 today. Note patient appears to have delayed response to warfarin dose changes and has received 4-5mg  x6 days   Goal of Therapy:   INR 2.5-3   Plan:  Warfarin 3mg  PO  today Daily PT / INR   Biddie Sebek A. Levada Dy, PharmD, BCPS, FNKF Clinical Pharmacist [facility] Please utilize Amion for appropriate phone number to reach the unit pharmacist ([facility])

## 2019-06-02 DEATH — deceased
